# Patient Record
Sex: Female | Born: 1970 | Race: Black or African American | Hispanic: No | Smoking: Never smoker
Health system: Southern US, Community
[De-identification: ages and names within clinical notes are randomized; demographics above are authoritative.]

## PROBLEM LIST (undated history)

## (undated) DIAGNOSIS — I4891 Unspecified atrial fibrillation: Secondary | ICD-10-CM

## (undated) DIAGNOSIS — A419 Sepsis, unspecified organism: Secondary | ICD-10-CM

## (undated) DIAGNOSIS — E559 Vitamin D deficiency, unspecified: Secondary | ICD-10-CM

## (undated) DIAGNOSIS — I890 Lymphedema, not elsewhere classified: Secondary | ICD-10-CM

## (undated) DIAGNOSIS — L0291 Cutaneous abscess, unspecified: Secondary | ICD-10-CM

## (undated) DIAGNOSIS — D649 Anemia, unspecified: Secondary | ICD-10-CM

## (undated) DIAGNOSIS — I5030 Unspecified diastolic (congestive) heart failure: Secondary | ICD-10-CM

## (undated) DIAGNOSIS — L0390 Cellulitis, unspecified: Secondary | ICD-10-CM

## (undated) HISTORY — DX: Lymphedema, not elsewhere classified: I89.0

## (undated) HISTORY — DX: Morbid (severe) obesity due to excess calories: E66.01

## (undated) HISTORY — DX: Unspecified diastolic (congestive) heart failure: I50.30

## (undated) HISTORY — PX: INCISION AND DRAINAGE ABSCESS: SHX5864

## (undated) HISTORY — DX: Vitamin D deficiency, unspecified: E55.9

---

## 2013-05-12 ENCOUNTER — Encounter: Payer: Self-pay | Admitting: Internal Medicine

## 2013-05-12 ENCOUNTER — Non-Acute Institutional Stay (SKILLED_NURSING_FACILITY): Payer: Medicare Other | Admitting: Internal Medicine

## 2013-05-12 DIAGNOSIS — E46 Unspecified protein-calorie malnutrition: Secondary | ICD-10-CM

## 2013-05-12 DIAGNOSIS — L03116 Cellulitis of left lower limb: Secondary | ICD-10-CM | POA: Insufficient documentation

## 2013-05-12 DIAGNOSIS — I5030 Unspecified diastolic (congestive) heart failure: Secondary | ICD-10-CM | POA: Insufficient documentation

## 2013-05-12 DIAGNOSIS — I890 Lymphedema, not elsewhere classified: Secondary | ICD-10-CM | POA: Insufficient documentation

## 2013-05-12 DIAGNOSIS — E876 Hypokalemia: Secondary | ICD-10-CM | POA: Insufficient documentation

## 2013-05-12 DIAGNOSIS — R5381 Other malaise: Secondary | ICD-10-CM

## 2013-05-12 DIAGNOSIS — L0390 Cellulitis, unspecified: Secondary | ICD-10-CM

## 2013-05-12 DIAGNOSIS — L0291 Cutaneous abscess, unspecified: Secondary | ICD-10-CM

## 2013-05-12 DIAGNOSIS — E559 Vitamin D deficiency, unspecified: Secondary | ICD-10-CM | POA: Insufficient documentation

## 2013-05-12 NOTE — Progress Notes (Signed)
Patient ID: Michele Weber, female   DOB: [birth year], 43 y.o.   MRN: [id number]     [facility]  PCP: No primary provider on file.  No Known Allergies  Chief Complaint: new admission  HPI:  43 y/o female patient is here for STR after hospital and [facility] admission with cellulitis. She has morbid obesity and chronic lymphedema. She was on iv antibiotics and then switched to oral antibiotics, has now completed antibiotic course and is here for rehabilitation. She has been evaluated for rehabilitation. She was on appetite suppresant in [facility] which was discontinued on admission. Pt enquires about this. She complaints of pain with warmth on her left thigh today. No other complaints  Review of Systems:  Constitutional: Negative for fever, chills,diaphoresis.  HENT: Negative for congestion, hearing loss and sore throat.   Eyes: Negative for eye pain, blurred vision, double vision and discharge.  Respiratory: Negative for cough, sputum production, shortness of breath and wheezing.   Cardiovascular: Negative for chest pain, palpitations, orthopnea  Gastrointestinal: Negative for heartburn, nausea, vomiting, abdominal pain, diarrhea and constipation.  Genitourinary: has foley in place Musculoskeletal: Negative for back pain, falls, joint pain and myalgias.  Skin: Negative for itching and rash.  Neurological: Negative for dizziness, tingling, focal weakness and headaches. has generalized weakness Psychiatric/Behavioral: Negative for depression and memory loss. The patient is not nervous/anxious.     Past Medical History  Diagnosis Date  . Morbid obesity   . Lymphedema of both lower extremities   . Diastolic CHF   . Vitamin D deficiency    No past surgical history on file. Social History:   reports that she has never smoked. She does not have any smokeless tobacco history on file. She reports that she does not drink alcohol or use illicit drugs.  Family History  Problem  Relation Age of Onset  . Diabetes Father     Medications: Patient's Medications  New Prescriptions   No medications on file  Previous Medications   ACETAMINOPHEN (TYLENOL 8 HOUR) 650 MG CR TABLET    Take 650 mg by mouth every 8 (eight) hours as needed for pain.   FUROSEMIDE (LASIX) 40 MG TABLET    Take 40 mg by mouth 2 (two) times daily.   MICONAZOLE (MICOTIN) 2 % POWDER    Apply 1 application topically as needed for itching.   POTASSIUM CHLORIDE SA (K-DUR,KLOR-CON) 20 MEQ TABLET    Take 40 mEq by mouth 2 (two) times daily.  Modified Medications   No medications on file  Discontinued Medications   No medications on file     Physical Exam:  Filed Vitals:   05/12/13 1511  BP: 140/78  Pulse: 90  Temp: 97.5 F (36.4 C)  Resp: 18  Weight: 550 lb (249.478 kg)  SpO2: 95%    General- adult female in no acute distress, morbidly obese Head- atraumatic, normocephalic Eyes- PERRLA, EOMI, no pallor, no icterus, no discharge Neck- no lymphadenopathy, no thyromegaly Cardiovascular- normal s1,s2, no murmurs/ rubs/ gallops Respiratory- bilateral clear to auscultation, no wheeze, no rhonchi, no crackles, no use of accessory muscles Abdomen- bowel sounds present, soft, non tender Musculoskeletal- limited ROM given body habitus and edema Neurological- no focal deficit Skin- warm and dry, has redness with warmth and slight tenderness in inner mid thigh area on left leg. Has dressing in place, small amount of serous drainage present, dry skin Psychiatry- alert and oriented to person, place and time, normal mood and affect   Labs  reviewed: 05/08/13 na 138, k 3.9, co2 35, bun 11, cr 0.8  Assessment/Plan  Cellulitis- concern for new cellulitis on left thigh. Will start her prophylactically on doxycycline 100 mg bid for now for 10 days and reassess. Continue wound care and foley care.   Lymphedema- persists. Will have her work with therapy team for strengthening exercise, fall precautions.  Continue lasix 40 mg bid to help with the swelling  Hypokalemia- continue kcl supplement, monitor bmp  Deconditioning- encourage out of bed, fall precautions, to work with PT and OT for strengthening exercise  Protein calorie malnutrition- protein supplement to be continued. Will add vitamin c and zinc supplement to help with wound healing.   Morbid obesity- will stop appetite supressant given her hx of cardiac disease and concern for side effects/ contraindication with heart disease   Family/ staff Communication: reviewed care plan with patient and nursing supervisor   Goals of care: STR   Labs/tests ordered: cbc with diff, bmp    Oneal GroutMAHIMA Deisha Stull, MD  [facility] [phone] (Monday-Friday 8 am - 5 pm) [phone] (afterhours)

## 2013-05-13 LAB — HEPATIC FUNCTION PANEL
ALT: 16 U/L (ref 7–35)
AST: 17 U/L (ref 13–35)
Alkaline Phosphatase: 47 U/L (ref 25–125)
Bilirubin, Total: 0.4 mg/dL

## 2013-05-13 LAB — CBC AND DIFFERENTIAL
HCT: 34 % — AB (ref 36–46)
Hemoglobin: 11.1 g/dL — AB (ref 12.0–16.0)
Platelets: 297 10*3/uL (ref 150–399)
WBC: 7.2 10*3/mL

## 2013-05-13 LAB — BASIC METABOLIC PANEL
BUN: 11 mg/dL (ref 4–21)
CREATININE: 1 mg/dL (ref 0.5–1.1)
Glucose: 85 mg/dL
POTASSIUM: 4.1 mmol/L (ref 3.4–5.3)
Sodium: 136 mmol/L — AB (ref 137–147)

## 2013-06-03 ENCOUNTER — Non-Acute Institutional Stay (SKILLED_NURSING_FACILITY): Payer: Medicare Other | Admitting: Internal Medicine

## 2013-06-03 DIAGNOSIS — S81009A Unspecified open wound, unspecified knee, initial encounter: Secondary | ICD-10-CM

## 2013-06-03 DIAGNOSIS — S91009A Unspecified open wound, unspecified ankle, initial encounter: Principal | ICD-10-CM

## 2013-06-03 DIAGNOSIS — S81809A Unspecified open wound, unspecified lower leg, initial encounter: Principal | ICD-10-CM

## 2013-06-03 DIAGNOSIS — I890 Lymphedema, not elsewhere classified: Secondary | ICD-10-CM

## 2013-06-03 NOTE — Progress Notes (Signed)
Patient ID: Michele MowersSharon Dauphinais, female   DOB: 05/03/1970, 43 y.o.   MRN: [id number] [facility] Chief complaint followup for left leg edema History; this is a 43 year old woman who came to us from [facility]. She had been admitted with cellulitis. The situation is complicated by a lymphedema. She tells me that the swelling in her leg waxes and wanes and asked me to see her because of this. She also has morbid obesity and history of diastolic heart failure, and obstructive sleep apnea.. She currently is on Lasix 40 mg. She was put on doxycycline on admission for possible cellulitis.  Physical examination Gen. morbidly obese patient in no distress Respiratory surprisingly clear entry bilaterally Cardiac heart sounds are normal no overt signs of CHF Extremities she has bilateral lymphedema however this is much worse on the left. Medially there is a tremendous pannus in the upper thigh and also more distally. There is peau d'orange in both of these areas. In the superior area there is actually a superficial wound.  Impression/plan #1 secondary lymphedema; the patient tells me that this started in her 20's her right leg is not nearly as bad as the left. I think this patient is going to have recurrent problems with skin breakdown, coexistent infection unless we can find some way to control the degree of swelling in this left leg. She would not be a candidate for any form of wrap, the leg is just simply too large and the peau d'orange areas are simply too protuberant. The only thing that I could think of to try would be to see if she might be a candidate for external compression pumps. There would likely be insurance issues here as well as I think simply mechanical issues of whether this leg can't even be fitted. It is simply that large.

## 2013-06-07 ENCOUNTER — Encounter: Payer: Self-pay | Admitting: Adult Health

## 2013-06-07 ENCOUNTER — Non-Acute Institutional Stay (SKILLED_NURSING_FACILITY): Payer: Medicare Other | Admitting: Adult Health

## 2013-06-07 DIAGNOSIS — I890 Lymphedema, not elsewhere classified: Secondary | ICD-10-CM

## 2013-06-07 DIAGNOSIS — I509 Heart failure, unspecified: Secondary | ICD-10-CM

## 2013-06-07 DIAGNOSIS — I5030 Unspecified diastolic (congestive) heart failure: Secondary | ICD-10-CM

## 2013-06-07 DIAGNOSIS — L0390 Cellulitis, unspecified: Secondary | ICD-10-CM

## 2013-06-07 DIAGNOSIS — N912 Amenorrhea, unspecified: Secondary | ICD-10-CM

## 2013-06-07 DIAGNOSIS — R5381 Other malaise: Secondary | ICD-10-CM

## 2013-06-07 DIAGNOSIS — E876 Hypokalemia: Secondary | ICD-10-CM

## 2013-06-07 DIAGNOSIS — L0291 Cutaneous abscess, unspecified: Secondary | ICD-10-CM

## 2013-06-07 NOTE — Progress Notes (Signed)
Patient ID: Michele MowersSharon Mclean, female   DOB: November 17, 1970, 43 y.o.   MRN: [id number]     [facility]  No Known Allergies   Chief Complaint  Patient presents with  . Medical Management of Chronic Issues    HPI:  She is being seen for the management of her chronic illnesses. She is developing a cellulitis on her left inner thigh. The area is red warm and inflamed. There is drainage present. She was hospitalized due to her cellulitis recently and was placed on doxycycline at that time. She has severe lymphedema to both lower extremities with the left far worse than the right.   She states that she has not had a menstrual cycle in over 2 months. She states normally her cycle is regular and has not missed a cycle. This is unusual for her.   Past Medical History  Diagnosis Date  . Morbid obesity   . Lymphedema of both lower extremities   . Diastolic CHF   . Vitamin D deficiency     No past surgical history on file.  VITAL SIGNS BP 126/60  Pulse 80  Ht 5' (1.524 m)  Wt 564 lb (255.829 kg)  BMI 110.15 kg/m2   Patient's Medications  New Prescriptions   No medications on file  Previous Medications   ACETAMINOPHEN (TYLENOL 8 HOUR) 650 MG CR TABLET    Take 650 mg by mouth every 8 (eight) hours as needed for pain.   FUROSEMIDE (LASIX) 40 MG TABLET    Take 40 mg by mouth 2 (two) times daily.   METHOCARBAMOL (ROBAXIN) 500 MG TABLET    Take 500 mg by mouth every 8 (eight) hours as needed for muscle spasms.   MICONAZOLE (MICOTIN) 2 % POWDER    Apply 1 application topically as needed for itching.   POTASSIUM CHLORIDE SA (K-DUR,KLOR-CON) 20 MEQ TABLET    Take 40 mEq by mouth 2 (two) times daily.   TRAMADOL (ULTRAM) 50 MG TABLET    Take by mouth every 4 (four) hours as needed.  Modified Medications   No medications on file  Discontinued Medications   No medications on file    SIGNIFICANT DIAGNOSTIC EXAMS   LABS REVIEWED:   05-13-13: wbc 7.2; hgb 11.1; hct 34.4; mcv 91 plt 297; glucose  85; bun 11; creat 0.99; k+4.1; na++ 136 ;liver normal albumin 3.0     Review of Systems  Constitutional: Negative for malaise/fatigue.  Respiratory: Negative for cough and shortness of breath.   Cardiovascular: Positive for leg swelling. Negative for chest pain and palpitations.       Bilateral lower extremity lymphedema is severe   Musculoskeletal: Negative for joint pain and myalgias.  Skin:       Has sore on left inner thigh  Psychiatric/Behavioral: Negative for depression. The patient is not nervous/anxious.    Physical Exam  Constitutional: She is oriented to person, place, and time. No distress.  Morbid obesity   Neck: Neck supple. No JVD present.  Cardiovascular: Normal rate, regular rhythm and intact distal pulses.   GI: Soft. Bowel sounds are normal.  Genitourinary:  Has foley  Musculoskeletal: She exhibits edema.  Is unable to move lower extremities due to lymphedema and obesity is able to move upper extremities   Neurological: She is alert and oriented to person, place, and time.  Skin: Skin is warm and dry. She is not diaphoretic.  Left inner thigh wound with pink wound bed with slough present; the surrounding tissue is erythremic the  skin is hard to touch and warm to touch. There is severe lymphedema present.   Psychiatric: She has a normal mood and affect.     ASSESSMENT/ PLAN:  1. Morbid obesity: she is without change in her status; her current weight is 564 pounds; will continue to monitor her status   2. Lymphedema left lower extremity: is without change; she is being fitted this week for external compression hose for treatment of her lymphedema; will continue robaxin 500 mg every 8 hours for spasms and wil continue her ultram 50 mg every 4 hours as needed for pain;  will monitor  3. Diastolic chf: she is present stable will continue lasix 40 mg twice daily with k+ 40 meq twice daily    4. Cellulitis left thigh: will begin her on keflex 500 mg four times daily  for 3 weeks with florastor twice daily for 3 weeks as probiotic will monitor;   5. Left thigh wound: will continue current treatment and will continue to monitor her status.   6. Physical deconditioning: will continue therapy as directed   7. Amenorrhea: will check an estrogen fh and fsh; will need to send her to gyn for further workup.      Synthia Innocenteborah Khylee Algeo NP [facility]  Contact [phone] Monday through Friday 8am- 5pm  After hours call [phone]

## 2013-07-04 ENCOUNTER — Non-Acute Institutional Stay (SKILLED_NURSING_FACILITY): Payer: Medicare Other | Admitting: Internal Medicine

## 2013-07-04 DIAGNOSIS — I509 Heart failure, unspecified: Secondary | ICD-10-CM

## 2013-07-04 DIAGNOSIS — Z79899 Other long term (current) drug therapy: Secondary | ICD-10-CM

## 2013-07-04 DIAGNOSIS — E876 Hypokalemia: Secondary | ICD-10-CM

## 2013-07-04 DIAGNOSIS — I890 Lymphedema, not elsewhere classified: Secondary | ICD-10-CM

## 2013-07-04 DIAGNOSIS — I5030 Unspecified diastolic (congestive) heart failure: Secondary | ICD-10-CM

## 2013-07-04 NOTE — Progress Notes (Signed)
         PROGRESS NOTE  DATE: 07/04/2013  FACILITY: Nursing Home Location: [facility]  LEVEL OF CARE: SNF (31)  Routine Visit  CHIEF COMPLAINT:  Manage lymphedema, CHF and hypokalemia  HISTORY OF PRESENT ILLNESS:  REASSESSMENT OF ONGOING PROBLEM(S):  LYMPHEDEMA: The treatment nurse reports that patient's left inner thigh has increased edema and warmth. Patient has massive lymphedema. Patient denies tenderness or redness in the left eye. Symptoms were noted this morning. She has Completed antibiotics for cellulitis last week. Patient denies fever chills or night sweats.  CHF:The patient does not relate significant weight changes, denies sob, DOE, orthopnea, PNDs, palpitations or chest pain.  CHF remains stable.  No complications form the medications being used. Patient has chronic lower extremity swelling.  HYPOKALEMIA: The patient's hypokalemia remains stable. Patient denies muscle cramping or palpitations. No complications reported from current potassium supplementation.  PAST MEDICAL HISTORY : Reviewed.  No changes/see problem list  CURRENT MEDICATIONS: Reviewed per MAR/see medication list  REVIEW OF SYSTEMS:  GENERAL: no change in appetite, no fatigue, no weight changes, no fever, chills or weakness RESPIRATORY: no cough, SOB, DOE, wheezing, hemoptysis CARDIAC: no chest pain, or palpitations, complains of chronic LE swelling GI: no abdominal pain, diarrhea, constipation, heart burn, nausea or vomiting  PHYSICAL EXAMINATION  VS:  See VS section  GENERAL: no acute distress, morbidly obese body habitus SKIN: Left thigh is warm, edematous, nontender, no erythema EYES: conjunctivae normal, sclerae normal, normal eye lids NECK: supple, trachea midline, no neck masses, no thyroid tenderness, no thyromegaly LYMPHATICS: no LAN in the neck, no supraclavicular LAN RESPIRATORY: breathing is even & unlabored, BS CTAB CARDIAC: RRR, no murmur,no extra heart  sounds, 6 bilateral lower extremity edema GI: abdomen soft, normal BS, no masses, no tenderness, no hepatomegaly, no splenomegaly PSYCHIATRIC: the patient is alert & oriented to person, affect & behavior appropriate  LABS/RADIOLOGY: 4-15 albumin 3.3 otherwise CMP normal  ASSESSMENT/PLAN:  CHF-compensated Lymphedema-no sign of cellulitis in the left thigh today. Discussed with treatment nurse to monitor daily for development of cellulitis changes. Hypokalemia-well repleted Morbid obesity-continue supportive care Check CBC  CPT CODE: 1610999309  Michele CoxGayani Y Byron Tipping, MD [facility] [phone]

## 2013-07-06 ENCOUNTER — Non-Acute Institutional Stay (SKILLED_NURSING_FACILITY): Payer: Medicare Other | Admitting: Internal Medicine

## 2013-07-06 DIAGNOSIS — R635 Abnormal weight gain: Secondary | ICD-10-CM

## 2013-07-06 DIAGNOSIS — E876 Hypokalemia: Secondary | ICD-10-CM

## 2013-07-06 DIAGNOSIS — D649 Anemia, unspecified: Secondary | ICD-10-CM

## 2013-07-07 NOTE — Progress Notes (Signed)
Patient ID: Michele MowersSharon Weber, female   DOB: 02-24-1970, 43 Weber.o.   MRN: [id number]            PROGRESS NOTE  DATE: 07/06/2013  FACILITY:  [facility]  LEVEL OF CARE: SNF (31)  Acute Visit  CHIEF COMPLAINT:  Manage weight gain and anemia.    HISTORY OF PRESENT ILLNESS: I was requested by the staff to assess the patient regarding above problem(s):  WEIGHT GAIN:  Staff report that patient has been gaining weight.  Admission weight was 558 on 05/17/2013.  Yesterday, weight was 581.  She has massive lymphedema and is on Lasix.  On 07/01/2013, weight was 581 as well.  Patient denies shortness of breath, but she does admit to increased lower extremity swelling.    ANEMIA: The anemia has been stable. The patient denies fatigue, melena or hematochezia. No complications from the medications currently being used.  On 07/05/2013:  Hemoglobin 10.9, MCV 92.  PAST MEDICAL HISTORY : Reviewed.  No changes/see problem list  CURRENT MEDICATIONS: Reviewed per MAR/see medication list  REVIEW OF SYSTEMS:  GENERAL: no change in appetite, no fatigue, no weight changes, no fever, chills or weakness RESPIRATORY: no cough, SOB, DOE,, wheezing, hemoptysis CARDIAC: no chest pain or palpitations;  chronic lower extremity swelling     GI: no abdominal pain, diarrhea, constipation, heart burn, nausea or vomiting  PHYSICAL EXAMINATION  VS:  T 97.7       P 86      RR 20      BP 120/60             GENERAL: no acute distress, morbidly obese body habitus EYES: conjunctivae normal, sclerae normal, normal eye lids NECK: supple, trachea midline, no neck masses, no thyroid tenderness, no thyromegaly LYMPHATICS: no LAN in the neck, no supraclavicular LAN RESPIRATORY: breathing is even & unlabored, BS CTAB CARDIAC: RRR, no murmur,no extra heart sounds, +2 bilateral lower extremity edema    GI: abdomen soft, normal BS, no masses, no tenderness, no hepatomegaly, no splenomegaly PSYCHIATRIC: the patient  is alert & oriented to person, affect & behavior appropriate  LABS/RADIOLOGY:   In 05/2013:  Albumin 3.3, AST 7, ALT 7.     ASSESSMENT/PLAN:  Weight gain.   New problem.  Increase Lasix to 60 mg b.i.d. for four days and then decrease back to 40 mg b.i.d.  Check TSH.    Anemia.  Stable.   Hypokalemia.  Increase K-dur to 40 mEq t.i.d. for four days, then decrease back to 40 mEq b.i.d.    CPT CODE: 8295699309       Michele CoxGayani Weber Michele Cazarez, MD [facility] [phone]

## 2013-07-08 DIAGNOSIS — D649 Anemia, unspecified: Secondary | ICD-10-CM | POA: Insufficient documentation

## 2013-07-08 DIAGNOSIS — R635 Abnormal weight gain: Secondary | ICD-10-CM | POA: Insufficient documentation

## 2013-07-18 ENCOUNTER — Non-Acute Institutional Stay (SKILLED_NURSING_FACILITY): Payer: Medicare Other | Admitting: Internal Medicine

## 2013-07-18 DIAGNOSIS — L02219 Cutaneous abscess of trunk, unspecified: Secondary | ICD-10-CM

## 2013-07-18 DIAGNOSIS — L03319 Cellulitis of trunk, unspecified: Principal | ICD-10-CM

## 2013-07-19 DIAGNOSIS — L0390 Cellulitis, unspecified: Secondary | ICD-10-CM

## 2013-07-19 DIAGNOSIS — L0291 Cutaneous abscess, unspecified: Secondary | ICD-10-CM

## 2013-07-20 ENCOUNTER — Non-Acute Institutional Stay (SKILLED_NURSING_FACILITY): Payer: Medicare Other | Admitting: Internal Medicine

## 2013-07-20 DIAGNOSIS — L0390 Cellulitis, unspecified: Secondary | ICD-10-CM

## 2013-07-21 DIAGNOSIS — L03319 Cellulitis of trunk, unspecified: Principal | ICD-10-CM

## 2013-07-21 DIAGNOSIS — L02219 Cutaneous abscess of trunk, unspecified: Secondary | ICD-10-CM | POA: Insufficient documentation

## 2013-07-21 NOTE — Progress Notes (Signed)
Patient ID: Michele Weber, female   DOB: September 30, 1970, 43 y.o.   MRN: [id number]            PROGRESS NOTE  DATE: 07/18/2013       FACILITY:  [facility]  LEVEL OF CARE: SNF (31)  Acute Visit  CHIEF COMPLAINT:  Manage right buttock abscess.    HISTORY OF PRESENT ILLNESS: I was requested by the staff to assess the patient regarding above problem(s):  Treatment nurse reports that patient has a boil on her right buttock from which there is a discharge, which was cultured.  The discharge grows three organisms:  E.coli, Staphylococcus aureus, and Streptococcus group B significantly.  The patient does complain of some pain but no fever, chills or night sweats.    PAST MEDICAL HISTORY : Reviewed.  No changes/see problem list  CURRENT MEDICATIONS: Reviewed per MAR/see medication list  REVIEW OF SYSTEMS:  GENERAL: no change in appetite, no fatigue, no weight changes, no fever, chills or weakness RESPIRATORY: no cough, SOB, DOE,, wheezing, hemoptysis CARDIAC: no chest pain or palpitations; complains of  chronic lower extremity swelling    GI: no abdominal pain, diarrhea, constipation, heart burn, nausea or vomiting  PHYSICAL EXAMINATION  VS:  T 97.6       P 80      RR 18      BP 138/76     POX 97%       WT (Lb) 573       GENERAL: no acute distress, morbidly obese body habitus NECK: supple, trachea midline, no neck masses, no thyroid tenderness, no thyromegaly RESPIRATORY: breathing is even & unlabored, BS CTAB CARDIAC: RRR, no murmur,no extra heart sounds, +6 bilateral lower extremity edema    GI: abdomen soft, normal BS, no masses, no tenderness, no hepatomegaly, no splenomegaly PSYCHIATRIC: the patient is alert & oriented to person, affect & behavior appropriate SKIN:  INSPECTION: right upper thigh has about a 2 cm abscess which is tender to palpation    ASSESSMENT/PLAN:  Right thigh abscess.  New problem.  All organisms are only sensitive to gentamicin.   Therefore, start gentamicin per pharmacy protocol for 10 days and probiotics b.i.d. for 14 days.    CPT CODE: 62952       Angela Cox, MD [facility] [phone]

## 2013-07-24 NOTE — Progress Notes (Addendum)
Patient ID: Michele Weber, female   DOB: [birth year], 43 y.o.   MRN: [id number]                  PROGRESS NOTE  DATE:  07/19/2013    [facility]    LEVEL OF CARE:   SNF   Acute Visit   CHIEF COMPLAINT:  Review of abscess on her posterior right thigh.    HISTORY OF PRESENT ILLNESS:  Michele Weber is a patient who has severe secondary lymphedema of both extremities.  She also tells me that she has had a recurrent abscess in the back of her right thigh just distal to her gluteal fold.  This last cleared up about 5-6 months ago.  She has one currently and a culture was done of this area that showed a multi-drug resistant E.coli, MRSA, as well as group B strep.  The area is actually open, drained, and appears to be closing.   She was put on gentamicin for seven days.   However, with her size, the gentamicin dose is being given at 450 mg IM, boiling down to four injections every eight hours.  I think this is superfluous as the area actually appears to be improving.    PHYSICAL EXAMINATION:   SKIN:  INSPECTION:  Right leg:  The area is still open.  It protrudes and is small, but still has a slight amount of drainage.  There does not appear to be underlying palpable abscess.  She does have other marks here which look like she has had healed areas, left greater than right actually.    ASSESSMENT/PLAN:  Skin abscess.  This had very difficult organisms in a difficult place place.  She is receiving four injections every 8 hours, which amounts to 12 injections a day.  I think this is unreasonable.  I have changed her to Augmentin and Zyvox.  Both organisms, the E.coli and the MRSA, are multi-drug resistant organisms.  Zyvox should cover the MRSA, and I have chosen to use Augmentin even though the sensitivity was only intermediate in terms of the E.coli.

## 2013-07-25 ENCOUNTER — Non-Acute Institutional Stay (SKILLED_NURSING_FACILITY): Payer: Medicare Other | Admitting: Internal Medicine

## 2013-07-25 DIAGNOSIS — I5030 Unspecified diastolic (congestive) heart failure: Secondary | ICD-10-CM

## 2013-07-25 DIAGNOSIS — E876 Hypokalemia: Secondary | ICD-10-CM

## 2013-07-25 DIAGNOSIS — I890 Lymphedema, not elsewhere classified: Secondary | ICD-10-CM

## 2013-07-25 DIAGNOSIS — I509 Heart failure, unspecified: Secondary | ICD-10-CM

## 2013-07-25 NOTE — Progress Notes (Signed)
                PROGRESS NOTE  DATE: 07-25-13  FACILITY: Nursing Home Location: [facility]  LEVEL OF CARE: SNF (31)  Routine Visit  CHIEF COMPLAINT:  Manage lymphedema, CHF and hypokalemia  HISTORY OF PRESENT ILLNESS:  REASSESSMENT OF ONGOING PROBLEM(S):  LYMPHEDEMA: The treatment nurse reports that patient's left inner thigh has increased edema and warmth. Patient has massive lymphedema. Patient denies tenderness or redness in the left eye. Symptoms were noted this morning. She has Completed antibiotics for cellulitis last week. Patient denies fever chills or night sweats.  CHF:The patient does not relate significant weight changes, denies sob, DOE, orthopnea, PNDs, palpitations or chest pain.  CHF remains stable.  No complications form the medications being used. Patient has chronic lower extremity swelling.  HYPOKALEMIA: The patient's hypokalemia remains stable. Patient denies muscle cramping or palpitations. No complications reported from current potassium supplementation.  PAST MEDICAL HISTORY : Reviewed.  No changes/see problem list  CURRENT MEDICATIONS: Reviewed per MAR/see medication list  REVIEW OF SYSTEMS:  GENERAL: no change in appetite, no fatigue, no weight changes, no fever, chills or weakness RESPIRATORY: no cough, SOB, DOE, wheezing, hemoptysis CARDIAC: no chest pain, or palpitations, complains of chronic LE swelling GI: no abdominal pain, diarrhea, constipation, heart burn, nausea or vomiting  PHYSICAL EXAMINATION  VS:  See VS section  GENERAL: no acute distress, morbidly obese body habitus NECK: supple, trachea midline, no neck masses, no thyroid tenderness, no thyromegaly RESPIRATORY: breathing is even & unlabored, BS CTAB CARDIAC: RRR, no murmur,no extra heart sounds, 6 bilateral lower extremity edema GI: abdomen soft, normal BS, no masses, no tenderness, no hepatomegaly, no splenomegaly PSYCHIATRIC: the patient is alert & oriented to  person, affect & behavior appropriate  LABS/RADIOLOGY:  5-15 TSH 4.35, hemoglobin 10.9, MCV 92 otherwise CBC normal, BMP normal 4-15 albumin 3.3 otherwise CMP normal  ASSESSMENT/PLAN:  CHF-compensated Lymphedema-Massive. Continue supportive care. Hypokalemia-well repleted Morbid obesity-continue supportive care Anemia-stable Right buttock abscess-on Zyvox and augmentin.  CPT CODE: 19166  Angela Cox, MD [facility] [phone]

## 2013-08-11 ENCOUNTER — Inpatient Hospital Stay (HOSPITAL_COMMUNITY): Payer: Medicare Other

## 2013-08-11 ENCOUNTER — Inpatient Hospital Stay (HOSPITAL_COMMUNITY)
Admission: EM | Admit: 2013-08-11 | Discharge: 2013-08-15 | DRG: 309 | Disposition: A | Discharge status: Skilled Nursing Facility | Payer: Medicare Other | Attending: Internal Medicine | Admitting: Internal Medicine

## 2013-08-11 ENCOUNTER — Encounter (HOSPITAL_COMMUNITY): Payer: Self-pay | Admitting: Emergency Medicine

## 2013-08-11 DIAGNOSIS — L02219 Cutaneous abscess of trunk, unspecified: Secondary | ICD-10-CM

## 2013-08-11 DIAGNOSIS — E876 Hypokalemia: Secondary | ICD-10-CM

## 2013-08-11 DIAGNOSIS — S81009A Unspecified open wound, unspecified knee, initial encounter: Secondary | ICD-10-CM

## 2013-08-11 DIAGNOSIS — Z6841 Body Mass Index (BMI) 40.0 and over, adult: Secondary | ICD-10-CM

## 2013-08-11 DIAGNOSIS — L03319 Cellulitis of trunk, unspecified: Secondary | ICD-10-CM

## 2013-08-11 DIAGNOSIS — E559 Vitamin D deficiency, unspecified: Secondary | ICD-10-CM

## 2013-08-11 DIAGNOSIS — S81809A Unspecified open wound, unspecified lower leg, initial encounter: Secondary | ICD-10-CM

## 2013-08-11 DIAGNOSIS — I5032 Chronic diastolic (congestive) heart failure: Secondary | ICD-10-CM

## 2013-08-11 DIAGNOSIS — E46 Unspecified protein-calorie malnutrition: Secondary | ICD-10-CM

## 2013-08-11 DIAGNOSIS — L97109 Non-pressure chronic ulcer of unspecified thigh with unspecified severity: Secondary | ICD-10-CM | POA: Diagnosis present

## 2013-08-11 DIAGNOSIS — I517 Cardiomegaly: Secondary | ICD-10-CM

## 2013-08-11 DIAGNOSIS — I4891 Unspecified atrial fibrillation: Principal | ICD-10-CM

## 2013-08-11 DIAGNOSIS — R5381 Other malaise: Secondary | ICD-10-CM

## 2013-08-11 DIAGNOSIS — I5030 Unspecified diastolic (congestive) heart failure: Secondary | ICD-10-CM | POA: Diagnosis present

## 2013-08-11 DIAGNOSIS — Z833 Family history of diabetes mellitus: Secondary | ICD-10-CM

## 2013-08-11 DIAGNOSIS — S91009A Unspecified open wound, unspecified ankle, initial encounter: Secondary | ICD-10-CM

## 2013-08-11 DIAGNOSIS — R635 Abnormal weight gain: Secondary | ICD-10-CM

## 2013-08-11 DIAGNOSIS — Z938 Other artificial opening status: Secondary | ICD-10-CM

## 2013-08-11 DIAGNOSIS — N912 Amenorrhea, unspecified: Secondary | ICD-10-CM

## 2013-08-11 DIAGNOSIS — I5042 Chronic combined systolic (congestive) and diastolic (congestive) heart failure: Secondary | ICD-10-CM | POA: Diagnosis present

## 2013-08-11 DIAGNOSIS — D649 Anemia, unspecified: Secondary | ICD-10-CM

## 2013-08-11 DIAGNOSIS — Z79899 Other long term (current) drug therapy: Secondary | ICD-10-CM

## 2013-08-11 DIAGNOSIS — I509 Heart failure, unspecified: Secondary | ICD-10-CM

## 2013-08-11 DIAGNOSIS — D6489 Other specified anemias: Secondary | ICD-10-CM | POA: Diagnosis present

## 2013-08-11 DIAGNOSIS — G4733 Obstructive sleep apnea (adult) (pediatric): Secondary | ICD-10-CM | POA: Diagnosis present

## 2013-08-11 DIAGNOSIS — I890 Lymphedema, not elsewhere classified: Secondary | ICD-10-CM

## 2013-08-11 LAB — BASIC METABOLIC PANEL
BUN: 18 mg/dL (ref 6–23)
CALCIUM: 8.8 mg/dL (ref 8.4–10.5)
CO2: 27 mEq/L (ref 19–32)
Chloride: 98 mEq/L (ref 96–112)
Creatinine, Ser: 0.92 mg/dL (ref 0.50–1.10)
GFR calc Af Amer: 87 mL/min — ABNORMAL LOW (ref >90)
GFR, EST NON AFRICAN AMERICAN: 75 mL/min — AB (ref >90)
GLUCOSE: 110 mg/dL — AB (ref 70–99)
POTASSIUM: 4.1 meq/L (ref 3.7–5.3)
Sodium: 136 mEq/L — ABNORMAL LOW (ref 137–147)

## 2013-08-11 LAB — COMPREHENSIVE METABOLIC PANEL
ALBUMIN: 2.4 g/dL — AB (ref 3.5–5.2)
ALT: <5 U/L (ref 0–35)
AST: 8 U/L (ref 0–37)
Alkaline Phosphatase: 46 U/L (ref 39–117)
BUN: 17 mg/dL (ref 6–23)
CALCIUM: 8.6 mg/dL (ref 8.4–10.5)
CO2: 30 mEq/L (ref 19–32)
CREATININE: 0.89 mg/dL (ref 0.50–1.10)
Chloride: 101 mEq/L (ref 96–112)
GFR calc Af Amer: >90 mL/min (ref >90)
GFR, EST NON AFRICAN AMERICAN: 78 mL/min — AB (ref >90)
Glucose, Bld: 110 mg/dL — ABNORMAL HIGH (ref 70–99)
Potassium: 4.5 mEq/L (ref 3.7–5.3)
Sodium: 139 mEq/L (ref 137–147)
Total Bilirubin: 0.3 mg/dL (ref 0.3–1.2)
Total Protein: 7.7 g/dL (ref 6.0–8.3)

## 2013-08-11 LAB — CBC
HCT: 34.5 % — ABNORMAL LOW (ref 36.0–46.0)
HEMOGLOBIN: 10.8 g/dL — AB (ref 12.0–15.0)
MCH: 28.5 pg (ref 26.0–34.0)
MCHC: 31.3 g/dL (ref 30.0–36.0)
MCV: 91 fL (ref 78.0–100.0)
Platelets: 298 10*3/uL (ref 150–400)
RBC: 3.79 MIL/uL — AB (ref 3.87–5.11)
RDW: 16 % — ABNORMAL HIGH (ref 11.5–15.5)
WBC: 6.5 10*3/uL (ref 4.0–10.5)

## 2013-08-11 LAB — CBC WITH DIFFERENTIAL/PLATELET
Basophils Absolute: 0 10*3/uL (ref 0.0–0.1)
Basophils Relative: 0 % (ref 0–1)
EOS ABS: 0.6 10*3/uL (ref 0.0–0.7)
EOS PCT: 9 % — AB (ref 0–5)
HCT: 37.3 % (ref 36.0–46.0)
Hemoglobin: 11.8 g/dL — ABNORMAL LOW (ref 12.0–15.0)
LYMPHS PCT: 41 % (ref 12–46)
Lymphs Abs: 2.7 10*3/uL (ref 0.7–4.0)
MCH: 29.3 pg (ref 26.0–34.0)
MCHC: 31.6 g/dL (ref 30.0–36.0)
MCV: 92.6 fL (ref 78.0–100.0)
MONOS PCT: 10 % (ref 3–12)
Monocytes Absolute: 0.7 10*3/uL (ref 0.1–1.0)
NEUTROS ABS: 2.7 10*3/uL (ref 1.7–7.7)
NEUTROS PCT: 40 % — AB (ref 43–77)
PLATELETS: 303 10*3/uL (ref 150–400)
RBC: 4.03 MIL/uL (ref 3.87–5.11)
RDW: 15.9 % — ABNORMAL HIGH (ref 11.5–15.5)
WBC: 6.7 10*3/uL (ref 4.0–10.5)

## 2013-08-11 LAB — TROPONIN I
Troponin I: <0.3 ng/mL (ref <0.30)
Troponin I: <0.3 ng/mL (ref <0.30)
Troponin I: <0.3 ng/mL (ref <0.30)

## 2013-08-11 LAB — PROTIME-INR
INR: 1.12 (ref 0.00–1.49)
PROTHROMBIN TIME: 14.4 s (ref 11.6–15.2)

## 2013-08-11 LAB — MRSA PCR SCREENING: MRSA by PCR: POSITIVE — AB

## 2013-08-11 LAB — MAGNESIUM: MAGNESIUM: 1.8 mg/dL (ref 1.5–2.5)

## 2013-08-11 LAB — TSH: TSH: 3.98 u[IU]/mL (ref 0.350–4.500)

## 2013-08-11 LAB — HEPARIN LEVEL (UNFRACTIONATED)

## 2013-08-11 MED ORDER — ONDANSETRON HCL 4 MG PO TABS: 4.0000 mg | ORAL_TABLET | Freq: Four times a day (QID) | ORAL | Status: DC | PRN

## 2013-08-11 MED ORDER — SODIUM CHLORIDE 0.9 % IJ SOLN
3.0000 mL | Freq: Two times a day (BID) | INTRAMUSCULAR | Status: DC
  Administered 2013-08-12 – 2013-08-14 (×4): 3 mL via INTRAVENOUS

## 2013-08-11 MED ORDER — ACETAMINOPHEN 325 MG PO TABS: 650.0000 mg | ORAL_TABLET | Freq: Four times a day (QID) | ORAL | Status: DC | PRN

## 2013-08-11 MED ORDER — ACETAMINOPHEN 650 MG RE SUPP: 650.0000 mg | Freq: Four times a day (QID) | RECTAL | Status: DC | PRN

## 2013-08-11 MED ORDER — HEPARIN BOLUS VIA INFUSION
5000.0000 [IU] | Freq: Once | INTRAVENOUS | Status: AC
  Administered 2013-08-11: 5000 [IU] via INTRAVENOUS
  Filled 2013-08-11: qty 5000

## 2013-08-11 MED ORDER — METOPROLOL TARTRATE 25 MG PO TABS
25.0000 mg | ORAL_TABLET | Freq: Three times a day (TID) | ORAL | Status: DC
  Filled 2013-08-11 (×4): qty 1

## 2013-08-11 MED ORDER — SODIUM CHLORIDE 0.9 % IV SOLN: INTRAVENOUS | Status: DC

## 2013-08-11 MED ORDER — ENOXAPARIN SODIUM 150 MG/ML ~~LOC~~ SOLN
130.0000 mg | SUBCUTANEOUS | Status: DC
  Filled 2013-08-11: qty 1

## 2013-08-11 MED ORDER — METOPROLOL TARTRATE 25 MG PO TABS
25.0000 mg | ORAL_TABLET | Freq: Three times a day (TID) | ORAL | Status: DC
  Administered 2013-08-11 – 2013-08-12 (×4): 25 mg via ORAL
  Filled 2013-08-11 (×8): qty 1

## 2013-08-11 MED ORDER — HEPARIN BOLUS VIA INFUSION
3000.0000 [IU] | Freq: Once | INTRAVENOUS | Status: AC
  Administered 2013-08-11: 3000 [IU] via INTRAVENOUS
  Filled 2013-08-11: qty 3000

## 2013-08-11 MED ORDER — HEPARIN (PORCINE) IN NACL 100-0.45 UNIT/ML-% IJ SOLN
2400.0000 [IU]/h | INTRAMUSCULAR | Status: DC
  Administered 2013-08-11: 1700 [IU]/h via INTRAVENOUS
  Administered 2013-08-11: 1400 [IU]/h via INTRAVENOUS
  Administered 2013-08-12: 2400 [IU]/h via INTRAVENOUS
  Filled 2013-08-11 (×5): qty 250

## 2013-08-11 MED ORDER — DILTIAZEM HCL 100 MG IV SOLR
5.0000 mg/h | INTRAVENOUS | Status: DC
  Administered 2013-08-11: 5 mg/h via INTRAVENOUS

## 2013-08-11 MED ORDER — DILTIAZEM LOAD VIA INFUSION
10.0000 mg | Freq: Once | INTRAVENOUS | Status: AC
  Administered 2013-08-11: 10 mg via INTRAVENOUS
  Filled 2013-08-11: qty 10

## 2013-08-11 MED ORDER — DILTIAZEM HCL 100 MG IV SOLR
5.0000 mg/h | INTRAVENOUS | Status: DC
  Administered 2013-08-11: 15 mg/h via INTRAVENOUS
  Administered 2013-08-11: 10 mg/h via INTRAVENOUS
  Administered 2013-08-11: 5 mg/h via INTRAVENOUS
  Filled 2013-08-11 (×3): qty 100

## 2013-08-11 MED ORDER — ONDANSETRON HCL 4 MG/2ML IJ SOLN: 4.0000 mg | Freq: Four times a day (QID) | INTRAMUSCULAR | Status: DC | PRN

## 2013-08-11 MED ORDER — SODIUM CHLORIDE 0.9 % IV BOLUS (SEPSIS)
1000.0000 mL | Freq: Once | INTRAVENOUS | Status: AC
  Administered 2013-08-11: 1000 mL via INTRAVENOUS

## 2013-08-11 NOTE — Progress Notes (Signed)
Pt HR fluctuating between 110's-150's, pt already on a Cardizem drip going at 15. RN paged MD. Orders given for 25 mg of Lopressor three times daily. Pt asymptomatic and is currently resting in bed with call bell within reach. Will continue to monitor.   Chesley Miresavis, Dreama R

## 2013-08-11 NOTE — ED Notes (Signed)
Per EMS[facility].  Tachycardia first noticed two days ago.  Per EMS EKG showing atrial fibrillation.  Pt is asymptomatic.  No hx of a fib nor tachycardia.   HR 80-160's.

## 2013-08-11 NOTE — ED Provider Notes (Signed)
CSN: [id number]     Arrival date & time 08/11/13  0001 History   First MD Initiated Contact with Patient 08/11/13 0012     Chief Complaint  Patient presents with  . Atrial Fibrillation     (Consider location/radiation/quality/duration/timing/severity/associated sxs/prior Treatment) HPI 43 year old female presents to emergency department from her nursing facility with complaint of tachycardia.  Patient reports that she has had a fast heart rate since Monday.  She denies any shortness of breath, no discomfort with palpitations, no prior history of same.  EKG done tonight showed A. fib.  Patient has history of morbid obesity, lymphedema of lower extremities.  She was admitted in March to [facility] after cellulitis.  She is currently now in a nursing home.  Patient currently not on anticoagulation. Past Medical History  Diagnosis Date  . Morbid obesity   . Lymphedema of both lower extremities   . Diastolic CHF   . Vitamin D deficiency    History reviewed. No pertinent past surgical history. Family History  Problem Relation Age of Onset  . Diabetes Father    History  Substance Use Topics  . Smoking status: Never Smoker   . Smokeless tobacco: Not on file  . Alcohol Use: No   OB History   Grav Para Term Preterm Abortions TAB SAB Ect Mult Living                 Review of Systems  See History of Present Illness; otherwise all other systems are reviewed and negative   Allergies  Review of patient's allergies indicates no known allergies.  Home Medications   Prior to Admission medications   Medication Sig Start Date End Date Taking? Authorizing Provider  acetaminophen (TYLENOL 8 HOUR) 650 MG CR tablet Take 650 mg by mouth every 8 (eight) hours as needed for pain.    Historical Provider, MD  furosemide (LASIX) 40 MG tablet Take 40 mg by mouth 2 (two) times daily.    Historical Provider, MD  methocarbamol (ROBAXIN) 500 MG tablet Take 500 mg by mouth every 8 (eight) hours as needed  for muscle spasms.    Historical Provider, MD  miconazole (MICOTIN) 2 % powder Apply 1 application topically as needed for itching.    Historical Provider, MD  potassium chloride SA (K-DUR,KLOR-CON) 20 MEQ tablet Take 40 mEq by mouth 2 (two) times daily.    Historical Provider, MD  traMADol (ULTRAM) 50 MG tablet Take by mouth every 4 (four) hours as needed.    Historical Provider, MD   BP 105/68  Pulse 123  Temp(Src) 97.7 F (36.5 C) (Oral)  Resp 16  SpO2 99%  LMP 08/10/2013 Physical Exam  Nursing note and vitals reviewed. Constitutional: She is oriented to person, place, and time. She appears well-developed and well-nourished. No distress.  Patient morbidly obese  HENT:  Head: Normocephalic and atraumatic.  Right Ear: External ear normal.  Left Ear: External ear normal.  Nose: Nose normal.  Mouth/Throat: Oropharynx is clear and moist.  Eyes: Conjunctivae and EOM are normal. Pupils are equal, round, and reactive to light.  Neck: Normal range of motion. Neck supple. No JVD present. No tracheal deviation present. No thyromegaly present.  Cardiovascular: Normal heart sounds and intact distal pulses.  Exam reveals no gallop and no friction rub.   No murmur heard. Tachycardia with irregular rate and rhythm  Pulmonary/Chest: Effort normal and breath sounds normal. No stridor. No respiratory distress. She has no wheezes. She has no rales. She exhibits no tenderness.  Abdominal: Soft. Bowel sounds are normal. She exhibits no distension and no mass. There is no tenderness. There is no rebound and no guarding.  Musculoskeletal: Normal range of motion. She exhibits edema (lymphedema of bilateral lower legs). She exhibits no tenderness.  Lymphadenopathy:    She has no cervical adenopathy.  Neurological: She is alert and oriented to person, place, and time. She exhibits normal muscle tone. Coordination normal.  Skin: Skin is warm and dry. No rash noted. No erythema. No pallor.  Psychiatric: She  has a normal mood and affect. Her behavior is normal. Judgment and thought content normal.    ED Course  Procedures (including critical care time)  CRITICAL CARE Performed by: Olivia MackieTTER,Naraly Fritcher M Total critical care time: 60 min Critical care time was exclusive of separately billable procedures and treating other patients. Critical care was necessary to treat or prevent imminent or life-threatening deterioration. Critical care was time spent personally by me on the following activities: development of treatment plan with patient and/or surrogate as well as nursing, discussions with consultants, evaluation of patient's response to treatment, examination of patient, obtaining history from patient or surrogate, ordering and performing treatments and interventions, ordering and review of laboratory studies, ordering and review of radiographic studies, pulse oximetry and re-evaluation of patient's condition.  Labs Review Labs Reviewed  CBC WITH DIFFERENTIAL - Abnormal; Notable for the following:    Hemoglobin 11.8 (*)    RDW 15.9 (*)    Neutrophils Relative % 40 (*)    Eosinophils Relative 9 (*)    All other components within normal limits  BASIC METABOLIC PANEL - Abnormal; Notable for the following:    Sodium 136 (*)    Glucose, Bld 110 (*)    GFR calc non Af Amer 75 (*)    GFR calc Af Amer 87 (*)    All other components within normal limits  TROPONIN I    Imaging Review No results found.   EKG Interpretation   Date/Time:  Thursday August 11 2013 00:04:55 EDT Ventricular Rate:  153 PR Interval:    QRS Duration: 83 QT Interval:  302 QTC Calculation: 482 R Axis:   9 Text Interpretation:  Atrial fibrillation Low voltage, precordial leads  Anteroseptal infarct, age indeterminate not Confirmed by Sheila Gervasi  MD, Briget Shaheed  ([id number]) on 08/11/2013 1:04:38 AM      MDM   Final diagnoses:  Atrial fibrillation with rapid ventricular response    43 year old female with new onset A. fib appears to  have gone on for last several days.  Plan for rate control with diltiazem, admission and cardiology consult as patient will not be able to easily be seen in the doctor's office.  1:45 AM  HR has improved to 100s from 170s.  Pt remains asymptomatic.  Hospitalist to admit.   Olivia Mackielga M Jarom Govan, MD 08/11/13 [phone]

## 2013-08-11 NOTE — ED Notes (Signed)
Cardizem rate increased to 10 mg per verbal from Dr. Norlene Campbelltter.

## 2013-08-11 NOTE — H&P (Signed)
[facility] History and Physical  Patient: Michele Weber  UJW:[id number]  DOB: [birth year]  DOS: the patient was seen and examined on 08/11/2013 PCP: Pcp Not In System  Chief Complaint: Increased heart rate  HPI: Michele MowersSharon Weber is a 43 y.o. female with Past medical history of diastolic dysfunction, lymphedema, morbid obesity, recent cellulitis. Patient presented with complaints of increased heart rate. She mentions that since last 2 days she has been noticing that her heart has been racing fast and it has been ranging in the 140s to 150s per minute. She never had similar symptoms in the past. She denies any chest pain or palpitation shortness of breath or dizziness or lightheadedness for nausea or vomiting or dizziness or diarrhea or constipation or abdominal pain. She denies any recent change in her medications. She never had any similar episode in the past. [facility] and due to deconditioning she was admitted to [facility] from there and from there was transferred to nursing home. In the interim she was started on Lasix. She uses CPAP and is compliant with that along with oxygen every night. No prior cardiac workup no prior history of atrial fibrillation.  The patient is coming from home. And at her baseline independent for most of her ADL.  Review of Systems: as mentioned in the history of present illness.  A Comprehensive review of the other systems is negative.  Past Medical History  Diagnosis Date  . Morbid obesity   . Lymphedema of both lower extremities   . Diastolic CHF   . Vitamin D deficiency    History reviewed. No pertinent past surgical history. Social History:  reports that she has never smoked. She does not have any smokeless tobacco history on file. She reports that she does not drink alcohol or use illicit drugs.  No Known Allergies  Family History  Problem Relation Age of Onset  . Diabetes  Father     Prior to Admission medications   Medication Sig Start Date End Date Taking? Authorizing Provider  acetaminophen (TYLENOL 8 HOUR) 650 MG CR tablet Take 650 mg by mouth every 8 (eight) hours as needed for pain.   Yes Historical Provider, MD  Ascorbic Acid (VITAMIN C PO) Take 1 tablet by mouth 2 (two) times daily.   Yes Historical Provider, MD  furosemide (LASIX) 40 MG tablet Take 40 mg by mouth 2 (two) times daily.   Yes Historical Provider, MD  potassium chloride SA (K-DUR,KLOR-CON) 20 MEQ tablet Take 40 mEq by mouth 2 (two) times daily.   Yes Historical Provider, MD    Physical Exam: Filed Vitals:   08/11/13 0145 08/11/13 0200 08/11/13 0239 08/11/13 0341  BP: 96/69 108/62 116/78 115/78  Pulse: 113 142 126 128  Temp:   97.9 F (36.6 C)   TempSrc:   Oral   Resp: 19 18 18    Height:   5' (1.524 m)   SpO2: 97% 97% 96%     General: Alert, Awake and Oriented to Time, Place and Person. Appear in mild distress Eyes: PERRL ENT: Oral Mucosa clear moist. Neck: no JVD Cardiovascular: S1 and S2 Present, no Murmur, Peripheral Pulses Present Respiratory: Bilateral Air entry equal and Decreased, Clear to Auscultation,  no Crackles,no wheezes Abdomen: Bowel Sound Present, Soft and Non tender Skin: no Rash Extremities: Bilateral Pedal edema, no calf tenderness Neurologic: Grossly no focal neuro deficit.  Labs on Admission:  CBC:  Recent Labs Lab 08/11/13 0027  WBC 6.7  NEUTROABS 2.7  HGB 11.8*  HCT 37.3  MCV 92.6  PLT 303    CMP     Component Value Date/Time   NA 136* 08/11/2013 0027   NA 136* 05/13/2013   K 4.1 08/11/2013 0027   CL 98 08/11/2013 0027   CO2 27 08/11/2013 0027   GLUCOSE 110* 08/11/2013 0027   BUN 18 08/11/2013 0027   BUN 11 05/13/2013   CREATININE 0.92 08/11/2013 0027   CREATININE 1.0 05/13/2013   CALCIUM 8.8 08/11/2013 0027   AST 17 05/13/2013   ALT 16 05/13/2013   ALKPHOS 47 05/13/2013   GFRNONAA 75* 08/11/2013 0027   GFRAA 87* 08/11/2013 0027    No  results found for this basename: LIPASE, AMYLASE,  in the last 168 hours No results found for this basename: AMMONIA,  in the last 168 hours   Recent Labs Lab 08/11/13 0027  TROPONINI <0.30   BNP (last 3 results) No results found for this basename: PROBNP,  in the last 8760 hours  Radiological Exams on Admission: No results found.  EKG: Independently reviewed. atrial fibrillation, rapid ventricular rate.  Assessment/Plan Principal Problem:   Atrial fibrillation with RVR Active Problems:   Morbid obesity   Lymphedema of both lower extremities   Diastolic CHF   Unspecified protein-calorie malnutrition   Physical deconditioning   Open wound of knee, leg (except thigh), and ankle, complicated   1. Atrial fibrillation with RVR Patient presents with complaints of increased heart rate she was found to have A. fib with RVR. She has been given Cardizem drip and I would also like Lopressor. She will be monitored on telemetry. Troponin are negative no electrolyte imbalance no respiratory compromise evident at present. Would continue to monitor serial troponins obtain limited echocardiogram and recheck electrolytes. Also check TSH.  2. For sleep apnea Continue CPAP each bedtime.  3. Diastolic dysfunction Holding Lasix and proviing gentle hydration.  4. Morbid obesity Continue to monitor, patient has chronic Foley secondary to the same and also to help with healing decubitus ulcer.  DVT Prophylaxis: subcutaneous Heparin Nutrition: N.p.o. except medication  Code Status: Full  Disposition: Admitted to inpatient in telemetry unit.  Author: Lynden OxfordPranav Patel, MD [facility] Pager: [phone] 08/11/2013, 3:47 AM    If 7PM-7AM, please contact night-coverage [email or web address] Password [email or web address]  **Disclaimer: This note may have been dictated with voice recognition software. Similar sounding words can inadvertently be transcribed and this note may contain transcription errors which  may not have been corrected upon publication of note.**

## 2013-08-11 NOTE — Progress Notes (Signed)
Echocardiogram 2D Echocardiogram has been performed.  Michele BasemanReel, James M 08/11/2013, 11:09 AM

## 2013-08-11 NOTE — Care Management Note (Signed)
    Page 1 of 1   08/15/2013     4:17:58 PM CARE MANAGEMENT NOTE 08/15/2013  Patient:  Michele Weber,Michele Weber   Account Number:  [id number]  Date Initiated:  08/11/2013  Documentation initiated by:  AMERSON,JULIE  Subjective/Objective Assessment:   Pt adm on 08/11/13 with Afib with RVR.  PTA, [facility]     Action/Plan:   CSW consulted to facilitate return to SNF when medically ready for dc.  Will follow progress.   Anticipated DC Date:  08/13/2013   Anticipated DC Plan:  SKILLED NURSING FACILITY  In-house referral  Clinical Social Worker      DC Planning Services  CM consult      Choice offered to / List presented to:             Status of service:  Completed, signed off Medicare Important Message given?  YES (If response is "NO", the following Medicare IM given date fields will be blank) Date Medicare IM given:  08/15/2013 Date Additional Medicare IM given:    Discharge Disposition:  SKILLED NURSING FACILITY  Per UR Regulation:  Reviewed for med. necessity/level of care/duration of stay  If discussed at Long Length of Stay Meetings, dates discussed:    Comments:  08/15/13 Sidney AceJulie Amerson, RN, BSN [phone] Pt discharged to SNF today, per CSW arrangements.

## 2013-08-11 NOTE — Progress Notes (Signed)
ANTICOAGULATION CONSULT NOTE  Pharmacy Consult for Heparin Indication: atrial fibrillation  No Known Allergies  Patient Measurements: Height: 5' (152.4 cm) Weight: 377 lb (171.006 kg) IBW/kg (Calculated) : 45.5 Heparin Dosing Weight: 92kg  Vital Signs: Temp: 98.2 F (36.8 C) (06/25 1533) Temp src: Oral (06/25 1533) BP: 108/63 mmHg (06/25 1533) Pulse Rate: 78 (06/25 1533)  Labs:  Recent Labs  08/11/13 0027 08/11/13 0402 08/11/13 1052 08/11/13 1625 08/11/13 1909  HGB 11.8* 10.8*  --   --   --   HCT 37.3 34.5*  --   --   --   PLT 303 298  --   --   --   LABPROT  --  14.4  --   --   --   INR  --  1.12  --   --   --   HEPARINUNFRC  --   --   --   --  <0.10*  CREATININE 0.92 0.89  --   --   --   TROPONINI <0.30 <0.30 <0.30 <0.30  --     Estimated Creatinine Clearance: 121.9 ml/min (by C-G formula based on Cr of 0.89).   Medical History: Past Medical History  Diagnosis Date  . Morbid obesity   . Lymphedema of both lower extremities   . Diastolic CHF   . Vitamin D deficiency     Medications:  Prescriptions prior to admission  Medication Sig Dispense Refill  . acetaminophen (TYLENOL 8 HOUR) 650 MG CR tablet Take 650 mg by mouth every 8 (eight) hours as needed for pain.      . Ascorbic Acid (VITAMIN C PO) Take 1 tablet by mouth 2 (two) times daily.      . furosemide (LASIX) 40 MG tablet Take 40 mg by mouth 2 (two) times daily.      . potassium chloride SA (K-DUR,KLOR-CON) 20 MEQ tablet Take 40 mEq by mouth 2 (two) times daily.        Assessment: Michele Weber to start heparin for new-onset Afib. Cardiology has been consulted - awaiting ECHO for consideration of NOACs. - Baseline INR: 1.12 - Hg 10.8, Plts wnl - No significant bleeding reported  Initial heparin level < 0.10  Goal of Therapy:  Heparin level 0.3-0.7 units/ml Monitor platelets by anticoagulation protocol: Yes   Plan:  Heparin IV bolus 3000 units x 1 Heparin drip to 1700 units/hr (17 ml/hr) Follow  up AM labs  Thank you. Okey RegalLisa Powell, PharmD [phone]  08/11/2013,8:08 PM

## 2013-08-11 NOTE — Progress Notes (Signed)
ANTICOAGULATION CONSULT NOTE - Initial Consult  Pharmacy Consult for Heparin Indication: atrial fibrillation  No Known Allergies  Patient Measurements: Height: 5' (152.4 cm) Weight: 377 lb (171.006 kg) IBW/kg (Calculated) : 45.5 Heparin Dosing Weight: 92kg  Vital Signs: Temp: 97.9 F (36.6 C) (06/25 0239) Temp src: Oral (06/25 0239) BP: 115/78 mmHg (06/25 0341) Pulse Rate: 128 (06/25 0341)  Labs:  Recent Labs  08/11/13 0027 08/11/13 0402  HGB 11.8* 10.8*  HCT 37.3 34.5*  PLT 303 298  LABPROT  --  14.4  INR  --  1.12  CREATININE 0.92 0.89  TROPONINI <0.30 <0.30    Estimated Creatinine Clearance: 121.9 ml/min (by C-G formula based on Cr of 0.89).   Medical History: Past Medical History  Diagnosis Date  . Morbid obesity   . Lymphedema of both lower extremities   . Diastolic CHF   . Vitamin D deficiency     Medications:  Prescriptions prior to admission  Medication Sig Dispense Refill  . acetaminophen (TYLENOL 8 HOUR) 650 MG CR tablet Take 650 mg by mouth every 8 (eight) hours as needed for pain.      . Ascorbic Acid (VITAMIN C PO) Take 1 tablet by mouth 2 (two) times daily.      . furosemide (LASIX) 40 MG tablet Take 40 mg by mouth 2 (two) times daily.      . potassium chloride SA (K-DUR,KLOR-CON) 20 MEQ tablet Take 40 mEq by mouth 2 (two) times daily.        Assessment: 43yof to start heparin for new-onset Afib. Cardiology has been consulted - awaiting ECHO for consideration of NOACs. - Baseline INR: 1.12 - Hg 10.8, Plts wnl - No significant bleeding reported  Goal of Therapy:  Heparin level 0.3-0.7 units/ml Monitor platelets by anticoagulation protocol: Yes   Plan:  1. Heparin IV bolus 5000 units x 1 2. Heparin drip 1400 units/hr (14 ml/hr) 3. Check heparin level 6 hours after initiation 4. Daily heparin level and CBC  Cleon DewDulaney, [place] Robert [phone] 08/11/2013,10:47 AM

## 2013-08-11 NOTE — Consult Note (Signed)
Reason for Consult:atrial fibrillation with rapid ventricular response Referring Physician: [facility]  Michele Weber is an 43 y.o. female.  XKG:YJEHUDJ is 43 year old female with past medical history significant for morbid obesity, obstructive sleep apnea on CPAP, history of lymphedema of lower extremities, history of congestive heart failure secondary to preserved LV systolic function,vitamin D deficiency, chronic anemia, history of recent cellulitis, was admitted yesterday because of palpitation off and on since Sunday.  Patient resident of [facility] noted by nurse to have irregular heart beat with heart rate in the 120s and 30s per patient on Sunday did not seek any medical attention but felt tired and fatigued late in the afternoon yesterday and so decided to come to the ER and was noted to be in A. Fib with RVR heart rate in the 140s and 50s.  Patient received IV Cardizem and started on by mouth Lopressor with control of heart rate in 70s to 80s.  Patient denies any chest pain nausea vomiting diaphoresis.  Denies PND orthopnea or leg swelling.  States occasional feels skipping of the heart beat but could not tell her heart was beating that fast.  Denies such episodes of palpitations in the past.  Denies shortness of breath.  Denies any leg swelling.  Past Medical History  Diagnosis Date  . Morbid obesity   . Lymphedema of both lower extremities   . Diastolic CHF   . Vitamin D deficiency     History reviewed. No pertinent past surgical history.  Family History  Problem Relation Age of Onset  . Diabetes Father     Social History:  reports that she has never smoked. She does not have any smokeless tobacco history on file. She reports that she does not drink alcohol or use illicit drugs.  Allergies: No Known Allergies  Medications: I have reviewed the patient's current medications.  Results for orders placed during the hospital encounter of 08/11/13 (from the past 48 hour(s))   CBC WITH DIFFERENTIAL     Status: Abnormal   Collection Time    08/11/13 12:27 AM      Result Value Ref Range   WBC 6.7  4.0 - 10.5 K/uL   RBC 4.03  3.87 - 5.11 MIL/uL   Hemoglobin 11.8 (*) 12.0 - 15.0 g/dL   HCT 37.3  36.0 - 46.0 %   MCV 92.6  78.0 - 100.0 fL   MCH 29.3  26.0 - 34.0 pg   MCHC 31.6  30.0 - 36.0 g/dL   RDW 15.9 (*) 11.5 - 15.5 %   Platelets 303  150 - 400 K/uL   Neutrophils Relative % 40 (*) 43 - 77 %   Neutro Abs 2.7  1.7 - 7.7 K/uL   Lymphocytes Relative 41  12 - 46 %   Lymphs Abs 2.7  0.7 - 4.0 K/uL   Monocytes Relative 10  3 - 12 %   Monocytes Absolute 0.7  0.1 - 1.0 K/uL   Eosinophils Relative 9 (*) 0 - 5 %   Eosinophils Absolute 0.6  0.0 - 0.7 K/uL   Basophils Relative 0  0 - 1 %   Basophils Absolute 0.0  0.0 - 0.1 K/uL  BASIC METABOLIC PANEL     Status: Abnormal   Collection Time    08/11/13 12:27 AM      Result Value Ref Range   Sodium 136 (*) 137 - 147 mEq/L   Potassium 4.1  3.7 - 5.3 mEq/L   Chloride 98  96 -  112 mEq/L   CO2 27  19 - 32 mEq/L   Glucose, Bld 110 (*) 70 - 99 mg/dL   BUN 18  6 - 23 mg/dL   Creatinine, Ser 0.92  0.50 - 1.10 mg/dL   Calcium 8.8  8.4 - 10.5 mg/dL   GFR calc non Af Amer 75 (*) >90 mL/min   GFR calc Af Amer 87 (*) >90 mL/min   Comment: (NOTE)     The eGFR has been calculated using the CKD EPI equation.     This calculation has not been validated in all clinical situations.     eGFR's persistently <90 mL/min signify possible Chronic Kidney     Disease.  TROPONIN I     Status: None   Collection Time    08/11/13 12:27 AM      Result Value Ref Range   Troponin I <0.30  <0.30 ng/mL   Comment:            Due to the release kinetics of cTnI,     a negative result within the first hours     of the onset of symptoms does not rule out     myocardial infarction with certainty.     If myocardial infarction is still suspected,     repeat the test at appropriate intervals.  MRSA PCR SCREENING     Status: Abnormal    Collection Time    08/11/13  2:42 AM      Result Value Ref Range   MRSA by PCR POSITIVE (*) NEGATIVE   Comment:            The GeneXpert MRSA Assay (FDA     approved for NASAL specimens     only), is one component of a     comprehensive MRSA colonization     surveillance program. It is not     intended to diagnose MRSA     infection nor to guide or     monitor treatment for     MRSA infections.     RESULT CALLED TO, READ BACK BY AND VERIFIED WITH:     K SANDERS,RN 08/11/13 0518 RHOLMES  TROPONIN I     Status: None   Collection Time    08/11/13  4:02 AM      Result Value Ref Range   Troponin I <0.30  <0.30 ng/mL   Comment:            Due to the release kinetics of cTnI,     a negative result within the first hours     of the onset of symptoms does not rule out     myocardial infarction with certainty.     If myocardial infarction is still suspected,     repeat the test at appropriate intervals.  COMPREHENSIVE METABOLIC PANEL     Status: Abnormal   Collection Time    08/11/13  4:02 AM      Result Value Ref Range   Sodium 139  137 - 147 mEq/L   Potassium 4.5  3.7 - 5.3 mEq/L   Chloride 101  96 - 112 mEq/L   CO2 30  19 - 32 mEq/L   Glucose, Bld 110 (*) 70 - 99 mg/dL   BUN 17  6 - 23 mg/dL   Creatinine, Ser 0.89  0.50 - 1.10 mg/dL   Calcium 8.6  8.4 - 10.5 mg/dL   Total Protein 7.7  6.0 - 8.3 g/dL   Albumin 2.4 (*)  3.5 - 5.2 g/dL   AST 8  0 - 37 U/L   ALT <5  0 - 35 U/L   Alkaline Phosphatase 46  39 - 117 U/L   Total Bilirubin 0.3  0.3 - 1.2 mg/dL   GFR calc non Af Amer 78 (*) >90 mL/min   GFR calc Af Amer >90  >90 mL/min   Comment: (NOTE)     The eGFR has been calculated using the CKD EPI equation.     This calculation has not been validated in all clinical situations.     eGFR's persistently <90 mL/min signify possible Chronic Kidney     Disease.  CBC     Status: Abnormal   Collection Time    08/11/13  4:02 AM      Result Value Ref Range   WBC 6.5  4.0 - 10.5 K/uL    RBC 3.79 (*) 3.87 - 5.11 MIL/uL   Hemoglobin 10.8 (*) 12.0 - 15.0 g/dL   HCT 75.3 (*) 87.2 - 35.7 %   MCV 91.0  78.0 - 100.0 fL   MCH 28.5  26.0 - 34.0 pg   MCHC 31.3  30.0 - 36.0 g/dL   RDW 50.2 (*) 68.0 - 61.4 %   Platelets 298  150 - 400 K/uL  PROTIME-INR     Status: None   Collection Time    08/11/13  4:02 AM      Result Value Ref Range   Prothrombin Time 14.4  11.6 - 15.2 seconds   INR 1.12  0.00 - 1.49  TSH     Status: None   Collection Time    08/11/13  4:02 AM      Result Value Ref Range   TSH 3.980  0.350 - 4.500 uIU/mL  MAGNESIUM     Status: None   Collection Time    08/11/13  4:02 AM      Result Value Ref Range   Magnesium 1.8  1.5 - 2.5 mg/dL    Dg Chest Port 1 View  08/11/2013   CLINICAL DATA:  Shortness of breath  EXAM: PORTABLE CHEST - 1 VIEW  COMPARISON:  None.  FINDINGS: There is mild cardiomegaly present with possible mild pulmonary vascular congestion. No infiltrate or effusion is seen. No bony abnormality is noted  IMPRESSION: Mild cardiomegaly with question of mild pulmonary vascular congestion.   Electronically Signed   By: Dwyane Dee M.D.   On: 08/11/2013 08:18    Review of Systems  Constitutional: Negative for fever and chills.  Eyes: Positive for double vision. Negative for photophobia.  Respiratory: Negative for cough, hemoptysis, sputum production and shortness of breath.   Cardiovascular: Positive for palpitations. Negative for chest pain, orthopnea, claudication, leg swelling and PND.  Gastrointestinal: Negative for nausea, vomiting and abdominal pain.  Genitourinary: Negative for dysuria.  Neurological: Negative for dizziness and headaches.   Blood pressure 115/78, pulse 128, temperature 97.9 F (36.6 C), temperature source Oral, resp. rate 18, height 5' (1.524 m), weight 171.006 kg (377 lb), last menstrual period 08/10/2013, SpO2 96.00%. Physical Exam  Constitutional: She is oriented to person, place, and time.  Eyes: Conjunctivae are  normal. Pupils are equal, round, and reactive to light. Left eye exhibits no discharge. No scleral icterus.  Neck: Normal range of motion. Neck supple. No JVD present. No tracheal deviation present. No thyromegaly present.  Cardiovascular:  Irregularly irregular S1 and S2 soft  Respiratory:  Decreased breath sounds at bases  GI: Soft. She exhibits  distension. There is no tenderness.  Musculoskeletal:  Chronic massive bilateral lymphedema  Neurological: She is alert and oriented to person, place, and time.    Assessment/Plan: New-onset A. Fib with RVR rate now better controlled with beta blockers and Cardizem Obstructive sleep apnea on CPAP Morbid obesity Chronic bilateral lymphedema Compensated diastolic congestive heart failure Vitamin D deficiency Anemia History of recent cellulitis Decubitus ulcers Plan Agree with present management Discussed with patient at length regarding rate control versus rhythm control using TEE assisted cardioversion, patient wanted to be treated medically with rate control only. Check 2-D echo if no evidence of valvular atrial fibrillation consider switching to novel  oral anticoagulants long-term in view of other comorbidities.  HARWANI,MOHAN N 08/11/2013, 10:22 AM

## 2013-08-11 NOTE — Progress Notes (Signed)
UR Completed.  Brown, Sarah Jane [phone] 08/11/2013  

## 2013-08-11 NOTE — Progress Notes (Signed)
Patient admitted after midnight- A/p Atrial fibrillation with RVR   Cardizem drip and start Lopressor.   telemetry.  Echocardiogram TSH ok  Cards consult as patient will need outpatient follow up- spoke with Dr. Sharyn LullHarwani Anticoagulation with IV heparin for now until echo back  For sleep apnea  Continue CPAP each bedtime.    Diastolic dysfunction  Holding Lasix and proviing gentle hydration.   Morbid obesity  Continue to monitor, patient has chronic Foley secondary to the same and also to help with healing decubitus ulcer  Was hospitalized in [place] and then [facility]  Marlin CanaryJessica Aren Cherne DO

## 2013-08-12 DIAGNOSIS — D649 Anemia, unspecified: Secondary | ICD-10-CM

## 2013-08-12 LAB — CBC
HCT: 33.1 % — ABNORMAL LOW (ref 36.0–46.0)
Hemoglobin: 10.3 g/dL — ABNORMAL LOW (ref 12.0–15.0)
MCH: 28.3 pg (ref 26.0–34.0)
MCHC: 31.1 g/dL (ref 30.0–36.0)
MCV: 90.9 fL (ref 78.0–100.0)
Platelets: 295 10*3/uL (ref 150–400)
RBC: 3.64 MIL/uL — AB (ref 3.87–5.11)
RDW: 16 % — ABNORMAL HIGH (ref 11.5–15.5)
WBC: 6.1 10*3/uL (ref 4.0–10.5)

## 2013-08-12 LAB — BASIC METABOLIC PANEL
BUN: 15 mg/dL (ref 6–23)
CO2: 27 mEq/L (ref 19–32)
Calcium: 8.6 mg/dL (ref 8.4–10.5)
Chloride: 100 mEq/L (ref 96–112)
Creatinine, Ser: 0.78 mg/dL (ref 0.50–1.10)
GFR calc Af Amer: >90 mL/min (ref >90)
GFR calc non Af Amer: >90 mL/min (ref >90)
Glucose, Bld: 104 mg/dL — ABNORMAL HIGH (ref 70–99)
Potassium: 4.2 mEq/L (ref 3.7–5.3)
Sodium: 138 mEq/L (ref 137–147)

## 2013-08-12 LAB — HEPARIN LEVEL (UNFRACTIONATED): Heparin Unfractionated: 0.2 IU/mL — ABNORMAL LOW (ref 0.30–0.70)

## 2013-08-12 MED ORDER — HEPARIN BOLUS VIA INFUSION
3000.0000 [IU] | Freq: Once | INTRAVENOUS | Status: AC
  Administered 2013-08-12: 3000 [IU] via INTRAVENOUS
  Filled 2013-08-12: qty 3000

## 2013-08-12 MED ORDER — HEPARIN (PORCINE) IN NACL 100-0.45 UNIT/ML-% IJ SOLN
2400.0000 [IU]/h | INTRAMUSCULAR | Status: AC
  Administered 2013-08-12: 2400 [IU]/h via INTRAVENOUS
  Filled 2013-08-12: qty 250

## 2013-08-12 MED ORDER — METOPROLOL TARTRATE 25 MG PO TABS
37.5000 mg | ORAL_TABLET | Freq: Three times a day (TID) | ORAL | Status: DC
Start: 2013-08-12 — End: 2013-08-13
  Administered 2013-08-12: 37.5 mg via ORAL
  Filled 2013-08-12 (×6): qty 1

## 2013-08-12 MED ORDER — FUROSEMIDE 40 MG PO TABS
40.0000 mg | ORAL_TABLET | Freq: Two times a day (BID) | ORAL | Status: DC
  Administered 2013-08-12 – 2013-08-15 (×5): 40 mg via ORAL
  Filled 2013-08-12 (×8): qty 1

## 2013-08-12 MED ORDER — DILTIAZEM HCL ER COATED BEADS 180 MG PO CP24
180.0000 mg | ORAL_CAPSULE | Freq: Every day | ORAL | Status: DC
  Administered 2013-08-12 – 2013-08-15 (×4): 180 mg via ORAL
  Filled 2013-08-12 (×4): qty 1

## 2013-08-12 MED ORDER — POTASSIUM CHLORIDE CRYS ER 20 MEQ PO TBCR
40.0000 meq | EXTENDED_RELEASE_TABLET | Freq: Two times a day (BID) | ORAL | Status: DC
  Administered 2013-08-12 – 2013-08-15 (×7): 40 meq via ORAL
  Filled 2013-08-12 (×8): qty 2

## 2013-08-12 MED ORDER — RIVAROXABAN 20 MG PO TABS
20.0000 mg | ORAL_TABLET | Freq: Every day | ORAL | Status: DC
  Administered 2013-08-12 – 2013-08-14 (×3): 20 mg via ORAL
  Filled 2013-08-12 (×5): qty 1

## 2013-08-12 NOTE — Consult Note (Signed)
WOC wound consult note Reason for Consult: evaluation of wound on the inner thigh. Pt with significant lymphedema. She reports only receiving this diagnosis within last year. She has never been evaluated or treated by a lymphedema therapist.  I will provide her with resource list, however with her being in a SNF transportation and mobility are a huge issue for her. She is followed by Dr. Leanord Hawkingobson in the SNF, who is one of the wound care center MD's as well.  Wound type: skin ulceration left inner upper thigh related to skin changes from lymphedema. She has multiple pendulous area on her bilateral inner legs and severe lymphedema.    Pressure Ulcer POA: No Measurement:4.5cm x 3.0cm x 0.2cm  Wound ZOX:WRUEAVWbed:fissure like tracks on the inner thigh with pale wound bed, some fibrin along the tracks and an intact skin island between the open areas Drainage (amount, consistency, odor) minimal, serous, non foul Periwound: intact, with Peau d'orange skin appearance over much of the inner legs  Dressing procedure/placement/frequency: continue silver hydrofiber as this has been working well in the facility. Either hold in place with dryflow underpad or dry dressing and tape whichever the patient prefers.    Noted: patient in bariatric bed, however due to her girth she is not currently able to assist with turning at all. The current bed and mattress are not wide enough to allow for space for her turn even if staff turned her.  I have spoke to portable equipment to request mattress replacement that will fit when the bed side are fully extended.  They will request this to be delivered to the patient's room for use.   Discussed POC with patient and bedside nurse.  Re consult if needed, will not follow at this time. Thanks  Melody Foot Lockerustin RN, CWOCN [phone])

## 2013-08-12 NOTE — Progress Notes (Signed)
Pt was placed on CPAP with a small FFM. Settings of 14 cmH2O. Pt has 2 LPM O2 bled in. Pt is comfortable. SPO2 is 100%.

## 2013-08-12 NOTE — Clinical Social Work Note (Signed)
Clinical Social Work Department BRIEF PSYCHOSOCIAL ASSESSMENT 08/12/2013  Patient:  Michele Weber, Michele Weber     Account Number:  [id number]     [place] date:  08/11/2013  Clinical Social Worker:  Myles Lipps  Date/Time:  08/12/2013 03:00 PM  Referred by:  Care Management  Date Referred:  08/12/2013 Referred for  SNF Placement   Other Referral:   Interview type:  Patient Other interview type:   No family/friends at bedside    PSYCHOSOCIAL DATA Living Status:  FACILITY Admitted from facility:  [facility] Level of care:  [place] Primary support name:  Louie Casa  [phone] Primary support relationship to patient:  CHILD, ADULT Degree of support available:   Adequate    CURRENT CONCERNS Current Concerns  Post-Acute Placement   Other Concerns:    SOCIAL WORK ASSESSMENT / PLAN Clinical Social Worker met with patient at bedside to offer support and discuss patient needs at discharge.  Patient states that she is from [facility] and plans to return at discharge.  CSW spoke with Elzie Rings at [facility] who states that patient is able to return but due to the staff needs for movement of patient will need to be a weekday admission.  Per facility, patient bed is a rented bed that can accommodate her size and will need to be returned if patient is not able to discharge on Monday 08/15/2013 for any reason.  CSW contacted [facility] who states that they will be able to provide transportation but to notify in the morning of transport needs in order to arrange to have the appropriate bed size.  CSW remains available for support and to facilitate patient discharge needs once medically ready.   Assessment/plan status:  Psychosocial Support/Ongoing Assessment of Needs Other assessment/ plan:   Information/referral to community resources:   Clinical Social Worker notified [facility] of possible transport needs and worked diligently with facility and patient to avoid bed hold  fees.    PATIENT'S/FAMILY'S RESPONSE TO PLAN OF CARE: Patient alert and oriented x3 sitting up in bed.  Patient states that none of her family is local at this time and she will update them of her plans over the phone.  Patient states that she is agreeable to return to [facility] for rehab needs.  Patient did not address limitations due to weight and/or the need for SNF but willing to continue stay.  Patient understanding of social work role and verbalized appreciation for support and concern.

## 2013-08-12 NOTE — Discharge Instructions (Signed)

## 2013-08-12 NOTE — Progress Notes (Addendum)
PROGRESS NOTE  Michele MowersSharon Weber NGE:[id number] DOB: 06/12/1970 DOA: 08/11/2013 PCP: Pcp Not In System  Michele Weber is a 43 y.o. female with Past medical history of diastolic dysfunction, lymphedema, morbid obesity, recent cellulitis.  Patient presented with complaints of increased heart rate. She mentions that since last 2 days she has been noticing that her heart has been racing fast and it has been ranging in the 140s to 150s per minute. She never had similar symptoms in the past. She denies any chest pain or palpitation shortness of breath or dizziness or lightheadedness for nausea or vomiting or dizziness or diarrhea or constipation or abdominal pain. She denies any recent change in her medications. She never had any similar episode in the past.  [facility] and due to deconditioning she was admitted to [facility] from there and from there was transferred to nursing home. In the interim she was started on Lasix. She uses CPAP and is compliant with that along with oxygen every night.  No prior cardiac workup no prior history of atrial fibrillation  Assessment/Plan: Atrial fibrillation with RVR  Cardizem drip and start Lopressor- wean off cardizem telemetry.  Echocardiogram ok  TSH ok  Dr. Sharyn LullHarwani  Anticoagulation with IV heparin for now- unsure due to size what anticoagulation patient can be on- eliquis???  sleep apnea  Continue CPAP each bedtime.   Diastolic dysfunction  Resume lasix Weights here not accurate  Morbid obesity  Continue to monitor, patient has chronic Foley secondary to the same and also to help with healing decubitus ulcer   LE wound- wound care consult  PT consult  Was hospitalized in [place] and then [facility] then [place]   Code Status: full Family Communication: patient Disposition Plan: SNF on monday   Consultants:  cards  Procedures:  echo    HPI/Subjective: No new  c/o  Objective: Filed Vitals:   08/12/13 0441  BP: 111/62  Pulse: 75  Temp: 97.8 F (36.6 C)  Resp: 18    Intake/Output Summary (Last 24 hours) at 08/12/13 1116 Last data filed at 08/12/13 0900  Gross per 24 hour  Intake   1339 ml  Output   4551 ml  Net  -3212 ml   Filed Weights   08/11/13 0239  Weight: 171.006 kg (377 lb)    Exam:   General:  A+Ox3, NAd  Cardiovascular: irr  Respiratory: clear  Abdomen: +Bs, soft  Musculoskeletal: large LE, weeping wound on posterior thigh   Data Reviewed: Basic Metabolic Panel:  Recent Labs Lab 08/11/13 0027 08/11/13 0402 08/12/13 0040  NA 136* 139 138  K 4.1 4.5 4.2  CL 98 101 100  CO2 27 30 27   GLUCOSE 110* 110* 104*  BUN 18 17 15   CREATININE 0.92 0.89 0.78  CALCIUM 8.8 8.6 8.6  MG  --  1.8  --    Liver Function Tests:  Recent Labs Lab 08/11/13 0402  AST 8  ALT <5  ALKPHOS 46  BILITOT 0.3  PROT 7.7  ALBUMIN 2.4*   No results found for this basename: LIPASE, AMYLASE,  in the last 168 hours No results found for this basename: AMMONIA,  in the last 168 hours CBC:  Recent Labs Lab 08/11/13 0027 08/11/13 0402 08/12/13 0040  WBC 6.7 6.5 6.1  NEUTROABS 2.7  --   --   HGB 11.8* 10.8* 10.3*  HCT 37.3 34.5* 33.1*  MCV 92.6 91.0 90.9  PLT 303 298  295   Cardiac Enzymes:  Recent Labs Lab 08/11/13 0027 08/11/13 0402 08/11/13 1052 08/11/13 1625  TROPONINI <0.30 <0.30 <0.30 <0.30   BNP (last 3 results) No results found for this basename: PROBNP,  in the last 8760 hours CBG: No results found for this basename: GLUCAP,  in the last 168 hours  Recent Results (from the past 240 hour(s))  MRSA PCR SCREENING     Status: Abnormal   Collection Time    08/11/13  2:42 AM      Result Value Ref Range Status   MRSA by PCR POSITIVE (*) NEGATIVE Final   Comment:            The GeneXpert MRSA Assay (FDA     approved for NASAL specimens     only), is one component of a     comprehensive MRSA colonization      surveillance program. It is not     intended to diagnose MRSA     infection nor to guide or     monitor treatment for     MRSA infections.     RESULT CALLED TO, READ BACK BY AND VERIFIED WITH:     Sharyne PeachK SANDERS,RN 08/11/13 0518 RHOLMES     Studies: Dg Chest Port 1 View  08/11/2013   CLINICAL DATA:  Shortness of breath  EXAM: PORTABLE CHEST - 1 VIEW  COMPARISON:  None.  FINDINGS: There is mild cardiomegaly present with possible mild pulmonary vascular congestion. No infiltrate or effusion is seen. No bony abnormality is noted  IMPRESSION: Mild cardiomegaly with question of mild pulmonary vascular congestion.   Electronically Signed   By: Dwyane DeePaul  Barry M.D.   On: 08/11/2013 08:18    Scheduled Meds: . metoprolol tartrate  37.5 mg Oral TID  . sodium chloride  3 mL Intravenous Q12H   Continuous Infusions: . diltiazem (CARDIZEM) infusion 5 mg/hr (08/11/13 1725)  . heparin 2,400 Units/hr (08/12/13 0847)   Antibiotics Given (last 72 hours)   None      Principal Problem:   Atrial fibrillation with RVR Active Problems:   Morbid obesity   Lymphedema of both lower extremities   Diastolic CHF   Unspecified protein-calorie malnutrition   Physical deconditioning   Open wound of knee, leg (except thigh), and ankle, complicated    Time spent: 25    Marlin CanaryVANN, JESSICA  [facility] Pager [phone]. If 7PM-7AM, please contact night-coverage at [email or web address], password [email or web address] 08/12/2013, 11:16 AM  LOS: 1 day

## 2013-08-12 NOTE — Progress Notes (Signed)
Subjective:  Patient denies any chest pain or shortness of breath. Denies any palpitation. Remains in A. fib with controlled ventricular response Cardizem is being weaned off  Objective:  Vital Signs in the last 24 hours: Temp:  [97.8 F (36.6 C)-98.2 F (36.8 C)] 97.8 F (36.6 C) (06/26 0441) Pulse Rate:  [75-88] 75 (06/26 0441) Resp:  [18-20] 18 (06/26 0441) BP: (108-111)/(56-63) 111/62 mmHg (06/26 0441) SpO2:  [95 %-99 %] 99 % (06/26 0441)  Intake/Output from previous day: 06/25 0701 - 06/26 0700 In: 1099 [P.O.:840; I.V.:259] Out: 3850 [Urine:3850] Intake/Output from this shift: Total I/O In: 240 [P.O.:240] Out: 701 [Urine:700; Stool:1]  Physical Exam: Neck: no adenopathy, no carotid bruit, no JVD and supple, symmetrical, trachea midline Lungs: clear to auscultation bilaterally Heart: irregularly irregular rhythm, S1, S2 normal and Soft systolic murmur noted Abdomen: Soft distended nontender Extremities: Chronic bilateral lymphedema  Lab Results:  Recent Labs  08/11/13 0402 08/12/13 0040  WBC 6.5 6.1  HGB 10.8* 10.3*  PLT 298 295    Recent Labs  08/11/13 0402 08/12/13 0040  NA 139 138  K 4.5 4.2  CL 101 100  CO2 30 27  GLUCOSE 110* 104*  BUN 17 15  CREATININE 0.89 0.78    Recent Labs  08/11/13 1052 08/11/13 1625  TROPONINI <0.30 <0.30   Hepatic Function Panel  Recent Labs  08/11/13 0402  PROT 7.7  ALBUMIN 2.4*  AST 8  ALT <5  ALKPHOS 46  BILITOT 0.3   No results found for this basename: CHOL,  in the last 72 hours No results found for this basename: PROTIME,  in the last 72 hours  Imaging: Imaging results have been reviewed and Dg Chest Port 1 View  08/11/2013   CLINICAL DATA:  Shortness of breath  EXAM: PORTABLE CHEST - 1 VIEW  COMPARISON:  None.  FINDINGS: There is mild cardiomegaly present with possible mild pulmonary vascular congestion. No infiltrate or effusion is seen. No bony abnormality is noted  IMPRESSION: Mild cardiomegaly  with question of mild pulmonary vascular congestion.   Electronically Signed   By: Dwyane DeePaul  Barry M.D.   On: 08/11/2013 08:18    Cardiac Studies:  Assessment/Plan:  New-onset A. Fib with RVR rate now better controlled with beta blockers and Cardizem  Obstructive sleep apnea on CPAP  Morbid obesity  Chronic bilateral lymphedema  Compensated diastolic congestive heart failure  Vitamin D deficiency  Anemia  History of recent cellulitis  Decubitus ulcers Plan DC IV Cardizem switched to by mouth as per orders DC IV heparin and start xarelto as per orders Okay to discharge from cardial point of view Followup with me in 2 weeks  LOS: 1 day    Rosita Guzzetta N 08/12/2013, 12:15 PM

## 2013-08-12 NOTE — Progress Notes (Signed)
Weigh patient x 2 in bed 642 lbs. weights much more than last night.

## 2013-08-12 NOTE — Progress Notes (Signed)
ANTICOAGULATION CONSULT NOTE - Follow Up Consult  Pharmacy Consult for Heparin  Indication: atrial fibrillation  No Known Allergies  Patient Measurements: Height: 5' (152.4 cm) Weight: 377 lb (171.006 kg) IBW/kg (Calculated) : 45.5 Heparin Dosing Weight: 92 kg   Vital Signs: Temp: 98.2 F (36.8 C) (06/25 2156) Temp src: Oral (06/25 2156) BP: 109/56 mmHg (06/25 2156) Pulse Rate: 88 (06/25 2156)  Labs:  Recent Labs  08/11/13 0027 08/11/13 0402 08/11/13 1052 08/11/13 1625 08/11/13 1909 08/12/13 0040  HGB 11.8* 10.8*  --   --   --  10.3*  HCT 37.3 34.5*  --   --   --  33.1*  PLT 303 298  --   --   --  295  LABPROT  --  14.4  --   --   --   --   INR  --  1.12  --   --   --   --   HEPARINUNFRC  --   --   --   --  <0.10* 0.20*  CREATININE 0.92 0.89  --   --   --   --   TROPONINI <0.30 <0.30 <0.30 <0.30  --   --     Estimated Creatinine Clearance: 121.9 ml/min (by C-G formula based on Cr of 0.89).   Medications:  Heparin 1700 units/hr  Assessment: 43 y/o F on heparin for afib, HL is 0.2, other labs as above.   Goal of Therapy:  Heparin level 0.3-0.7 units/ml Monitor platelets by anticoagulation protocol: Yes   Plan:  -Increase heparin drip to 2000 units/hr -0800 HL -Daily CBC/HL -Monitor for bleeding  Abran DukeLedford, James 08/12/2013,1:31 AM

## 2013-08-12 NOTE — Progress Notes (Addendum)
ANTICOAGULATION CONSULT NOTE - Follow Up Consult  Pharmacy Consult for Heparin Indication: atrial fibrillation  No Known Allergies  Patient Measurements: Height: 5' (152.4 cm) Weight: 377 lb (171.006 kg) IBW/kg (Calculated) : 45.5 Heparin Dosing Weight: ~100kg  Vital Signs: Temp: 97.8 F (36.6 C) (06/26 0441) Temp src: Oral (06/26 0441) BP: 111/62 mmHg (06/26 0441) Pulse Rate: 75 (06/26 0441)  Labs:  Recent Labs  08/11/13 0027 08/11/13 0402 08/11/13 1052 08/11/13 1625 08/11/13 1909 08/12/13 0040 08/12/13 0740  HGB 11.8* 10.8*  --   --   --  10.3*  --   HCT 37.3 34.5*  --   --   --  33.1*  --   PLT 303 298  --   --   --  295  --   LABPROT  --  14.4  --   --   --   --   --   INR  --  1.12  --   --   --   --   --   HEPARINUNFRC  --   --   --   --  <0.10* 0.20* <0.10*  CREATININE 0.92 0.89  --   --   --  0.78  --   TROPONINI <0.30 <0.30 <0.30 <0.30  --   --   --     Estimated Creatinine Clearance: 135.6 ml/min (by C-G formula based on Cr of 0.78).   Medications:  Heparin 2000 units/hr  Assessment: 43yof continues on heparin for new onset Afib. Heparin level (<0.1) remains undetectable (last level of 0.2 was most likely due to bolus effect as it was just 3-4 hours after heparin bolus given). Reported weights have varied (375lbs - 642lbs). Will plan to rebolus, increase rate and follow-up Cardiology discussion for long term anticoagulation. - H/H and Plts stable - No significant bleeding reported - No problems with line/infusion per RN  Goal of Therapy:  Heparin level 0.3-0.7 units/ml Monitor platelets by anticoagulation protocol: Yes   Plan:  1. Heparin IV bolus 3000 units x 1 2. Increase heparin drip to 2400 units/hr (24 ml/hr) 3. Check heparin level 6 hours after rate increase 4. Daily heparin level and CBC  Cleon DewDulaney, [employer] Robert [phone] 08/12/2013,8:22 AM   Addendum:  Cardiology has evaluated patient and has consulted pharmacy to transition patient  from heparin to Xarelto for Afib. Per literature, body weight extremes do not significantly affect Xarelto exposure.  - Hg 10.3, Plts stable - LFTs wnl - CrCl >100 ml/min  Plan: 1. Discontinue heparin at time of Xarelto administration 2. Xarelto 20mg  PO daily with supper - first dose tonight 3. Discontinue heparin labs  Wilfred LacyWesley Dulaney, PharmD Clinical Pharmacist [phone] 08/12/2013, 1:02 PM

## 2013-08-13 DIAGNOSIS — R5381 Other malaise: Secondary | ICD-10-CM

## 2013-08-13 LAB — CBC
HCT: 32.6 % — ABNORMAL LOW (ref 36.0–46.0)
HEMOGLOBIN: 9.8 g/dL — AB (ref 12.0–15.0)
MCH: 27.5 pg (ref 26.0–34.0)
MCHC: 30.1 g/dL (ref 30.0–36.0)
MCV: 91.6 fL (ref 78.0–100.0)
Platelets: 274 10*3/uL (ref 150–400)
RBC: 3.56 MIL/uL — AB (ref 3.87–5.11)
RDW: 16.1 % — ABNORMAL HIGH (ref 11.5–15.5)
WBC: 6.2 10*3/uL (ref 4.0–10.5)

## 2013-08-13 MED ORDER — METOPROLOL TARTRATE 25 MG PO TABS
37.5000 mg | ORAL_TABLET | Freq: Three times a day (TID) | ORAL | Status: DC
  Administered 2013-08-13 – 2013-08-15 (×5): 37.5 mg via ORAL
  Filled 2013-08-13 (×9): qty 1

## 2013-08-13 NOTE — Progress Notes (Signed)
PROGRESS NOTE  Michele MowersSharon Isadore Weber:[id number] DOB: 1970/05/08 DOA: 08/11/2013 PCP: Pcp Not In System  Michele MowersSharon Weber is a 43 y.o. female with Past medical history of diastolic dysfunction, lymphedema, morbid obesity, recent cellulitis.  Patient presented with complaints of increased heart rate. She mentions that since last 2 days she has been noticing that her heart has been racing fast and it has been ranging in the 140s to 150s per minute. She never had similar symptoms in the past. She denies any chest pain or palpitation shortness of breath or dizziness or lightheadedness for nausea or vomiting or dizziness or diarrhea or constipation or abdominal pain. She denies any recent change in her medications. She never had any similar episode in the past.  [facility] was [facility] for [facility] and due to deconditioning she was admitted to [facility] from there and from there was transferred to nursing home. In the interim she was started on Lasix. She uses CPAP and is compliant with that along with oxygen every night.  No prior cardiac workup no prior history of atrial fibrillation  Assessment/Plan: Atrial fibrillation with RVR  Lopressor- off cardizem gtt telemetry.  Echocardiogram ok  TSH ok  Dr. Arrie EasternHarwani  xaralto per cards  sleep apnea  Continue CPAP each bedtime.   Diastolic dysfunction  Resume lasix Weights here not accurate  Morbid obesity  Continue to monitor, patient has chronic Foley secondary to the same and also to help with healing decubitus ulcer   LE wound- wound care consult  PT consult  Was hospitalized in [facility] [place] and then [facility] then [facility]   Code Status: full Family Communication: patient Disposition Plan: SNF on monday   Consultants:  cards  Procedures:  echo    HPI/Subjective: Doing well, no SOB, no CP  Objective: Filed Vitals:   08/13/13 0454  BP: 99/57  Pulse: 80  Temp: 98 F (36.7 C)  Resp: 18     Intake/Output Summary (Last 24 hours) at 08/13/13 0854 Last data filed at 08/13/13 0600  Gross per 24 hour  Intake    960 ml  Output   3601 ml  Net  -2641 ml   Filed Weights   08/11/13 0239 08/13/13 0454  Weight: 171.006 kg (377 lb) 179.1 kg (394 lb 13.5 oz)    Exam:   General:  A+Ox3, NAd  Cardiovascular: irr  Respiratory: clear  Abdomen: +Bs, soft  Musculoskeletal: large LE, weeping wound on posterior thigh   Data Reviewed: Basic Metabolic Panel:  Recent Labs Lab 08/11/13 0027 08/11/13 0402 08/12/13 0040  NA 136* 139 138  K 4.1 4.5 4.2  CL 98 101 100  CO2 27 30 27   GLUCOSE 110* 110* 104*  BUN 18 17 15   CREATININE 0.92 0.89 0.78  CALCIUM 8.8 8.6 8.6  MG  --  1.8  --    Liver Function Tests:  Recent Labs Lab 08/11/13 0402  AST 8  ALT <5  ALKPHOS 46  BILITOT 0.3  PROT 7.7  ALBUMIN 2.4*   No results found for this basename: LIPASE, AMYLASE,  in the last 168 hours No results found for this basename: AMMONIA,  in the last 168 hours CBC:  Recent Labs Lab 08/11/13 0027 08/11/13 0402 08/12/13 0040 08/13/13 0327  WBC 6.7 6.5 6.1 6.2  NEUTROABS 2.7  --   --   --   HGB 11.8* 10.8* 10.3* 9.8*  HCT 37.3 34.5* 33.1* 32.6*  MCV 92.6 91.0 90.9 91.6  PLT 303 298 295 274   Cardiac Enzymes:  Recent Labs Lab 08/11/13 0027 08/11/13 0402 08/11/13 1052 08/11/13 1625  TROPONINI <0.30 <0.30 <0.30 <0.30   BNP (last 3 results) No results found for this basename: PROBNP,  in the last 8760 hours CBG: No results found for this basename: GLUCAP,  in the last 168 hours  Recent Results (from the past 240 hour(s))  MRSA PCR SCREENING     Status: Abnormal   Collection Time    08/11/13  2:42 AM      Result Value Ref Range Status   MRSA by PCR POSITIVE (*) NEGATIVE Final   Comment:            The GeneXpert MRSA Assay (FDA     approved for NASAL specimens     only), is one component of a     comprehensive MRSA colonization     surveillance program.  It is not     intended to diagnose MRSA     infection nor to guide or     monitor treatment for     MRSA infections.     RESULT CALLED TO, READ BACK BY AND VERIFIED WITH:     K SANDERS,RN 08/11/13 0518 RHOLMES     Studies: No results found.  Scheduled Meds: . diltiazem  180 mg Oral Daily  . furosemide  40 mg Oral BID  . metoprolol tartrate  37.5 mg Oral TID  . potassium chloride SA  40 mEq Oral BID  . rivaroxaban  20 mg Oral Q supper  . sodium chloride  3 mL Intravenous Q12H   Continuous Infusions:   Antibiotics Given (last 72 hours)   None      Principal Problem:   Atrial fibrillation with RVR Active Problems:   Morbid obesity   Lymphedema of both lower extremities   Diastolic CHF   Unspecified protein-calorie malnutrition   Physical deconditioning   Open wound of knee, leg (except thigh), and ankle, complicated    Time spent: 25    Michele CanaryVANN, Michele  [facility] Pager [phone]. If 7PM-7AM, please contact night-coverage at [email or web address], password [email or web address] 08/13/2013, 8:54 AM  LOS: 2 days

## 2013-08-13 NOTE — Progress Notes (Addendum)
Pt has CPAP in room with a small FFM. Settings at 14 cmH2O with 2 LPM [place] bled in. Pt states she can place mask on and off herself because it is like the machine she has. RT made pt aware that if she needed any help to please call. RT will continue to monitor.

## 2013-08-13 NOTE — Progress Notes (Signed)
Subjective:  Patient denies any chest pain or shortness of breath. Awaiting skilled nursing facility.  Objective:  Vital Signs in the last 24 hours: Temp:  [98 F (36.7 C)] 98 F (36.7 C) (06/27 0454) Pulse Rate:  [72-88] 86 (06/27 1053) Resp:  [18] 18 (06/27 0454) BP: (99-104)/(57-67) 100/58 mmHg (06/27 1053) SpO2:  [99 %-100 %] 99 % (06/27 0454) Weight:  [179.1 kg (394 lb 13.5 oz)] 179.1 kg (394 lb 13.5 oz) (06/27 0454)  Intake/Output from previous day: 06/26 0701 - 06/27 0700 In: 1200 [P.O.:1200] Out: 3601 [Urine:3600; Stool:1] Intake/Output from this shift: Total I/O In: 120 [P.O.:120] Out: -   Physical Exam: Neck: no adenopathy, no carotid bruit, no JVD and supple, symmetrical, trachea midline Lungs: clear to auscultation bilaterally Heart: irregularly irregular rhythm, S1, S2 normal and soft systolic murmur noted Abdomen: soft nontender distended Extremities: bilateral lymphedema  Lab Results:  Recent Labs  08/12/13 0040 08/13/13 0327  WBC 6.1 6.2  HGB 10.3* 9.8*  PLT 295 274    Recent Labs  08/11/13 0402 08/12/13 0040  NA 139 138  K 4.5 4.2  CL 101 100  CO2 30 27  GLUCOSE 110* 104*  BUN 17 15  CREATININE 0.89 0.78    Recent Labs  08/11/13 1052 08/11/13 1625  TROPONINI <0.30 <0.30   Hepatic Function Panel  Recent Labs  08/11/13 0402  PROT 7.7  ALBUMIN 2.4*  AST 8  ALT <5  ALKPHOS 46  BILITOT 0.3   No results found for this basename: CHOL,  in the last 72 hours No results found for this basename: PROTIME,  in the last 72 hours  Imaging: Imaging results have been reviewed and No results found.  Cardiac Studies:  Assessment/Plan:  New-onset A. Fib with controlled ventricular response Obstructive sleep apnea on CPAP  Morbid obesity  Chronic bilateral lymphedema  Compensated diastolic congestive heart failure  Vitamin D deficiency  Anemia  History of recent cellulitis  Decubitus ulcers Plan Continue present management I will  sign off please call if needed  LOS: 2 days    Velora Horstman N 08/13/2013, 11:22 AM

## 2013-08-13 NOTE — Evaluation (Signed)
Physical Therapy Evaluation Patient Details Name: Michele MowersSharon Weber MRN: [id number] DOB: [birth year] Today's Date: 08/13/2013   History of Present Illness  Pt is a 43 y/o female admitted with complaints of increased heart rate. Per chart review 2 days PTA pt had increased HR ranging from 140's to 150's per minute.   Clinical Impression  Pt admitted with the above. Pt currently with functional limitations due to the deficits listed below (see PT Problem List). At the time of PT eval pt was able to perform bed mobility and static sitting EOB with +2 assist. Pt will benefit from skilled PT to increase their independence and safety with mobility to allow discharge to the venue listed below.       Follow Up Recommendations SNF;Supervision/Assistance - 24 hour    Equipment Recommendations  None recommended by PT    Recommendations for Other Services       Precautions / Restrictions Precautions Precautions: Fall Precaution Comments: Pt is 394# Restrictions Weight Bearing Restrictions: No      Mobility  Bed Mobility Overal bed mobility: Needs Assistance Bed Mobility: Rolling;Supine to Sit Rolling: +2 for physical assistance;Max assist   Supine to sit: +2 for physical assistance;Mod assist     General bed mobility comments: Assist to elevate LE while pt rolls for peri-care. Pt can roll easier to the left than she can to the right. Sat up on R side of bed per pt request, and required assist to bring L side around to EOB. Pt was not able to get L hip all the way around, but was able to sit statically for ~8 minutes.   Transfers                    Ambulation/Gait                Stairs            Wheelchair Mobility    Modified Rankin (Stroke Patients Only)       Balance Overall balance assessment: Needs assistance Sitting-balance support: Feet supported;No upper extremity supported Sitting balance-Leahy Scale: Fair Sitting balance - Comments: Pt able to  sit EOB while NT washed back and we changed some of the pads under her.                                     Pertinent Vitals/Pain Vitals stable throughout session.     Home Living Family/patient expects to be discharged to:: Skilled nursing facility                      Prior Function Level of Independence: Needs assistance   Gait / Transfers Assistance Needed: Pt states she was working with PT  at SNF to improve ambulation. Able to walk ~20 feet at a time PTA.   ADL's / Homemaking Assistance Needed: Staff assisted her with all ADL's. She states she could wash her front but required assist for her back and all pericare.         Hand Dominance   Dominant Hand: Right    Extremity/Trunk Assessment   Upper Extremity Assessment: Overall WFL for tasks assessed           Lower Extremity Assessment: Generalized weakness;LLE deficits/detail (Minimal AROM supine in bed. )   LLE Deficits / Details: Unable to bend knee against gravity. Attempted to do PROM however pt was not assisting me  and was resisting at times (unintentionally I believe).   Cervical / Trunk Assessment: Normal  Communication   Communication: No difficulties  Cognition Arousal/Alertness: Awake/alert Behavior During Therapy: WFL for tasks assessed/performed Overall Cognitive Status: Within Functional Limits for tasks assessed                      General Comments      Exercises General Exercises - Lower Extremity Long Arc Quad: 10 reps      Assessment/Plan    PT Assessment Patient needs continued PT services  PT Diagnosis Difficulty walking;Generalized weakness   PT Problem List Decreased strength;Decreased range of motion;Decreased activity tolerance;Decreased balance;Decreased mobility;Decreased knowledge of use of DME;Decreased safety awareness;Decreased knowledge of precautions  PT Treatment Interventions DME instruction;Gait training;Stair training;Functional  mobility training;Therapeutic activities;Therapeutic exercise;Neuromuscular re-education;Patient/family education   PT Goals (Current goals can be found in the Care Plan section) Acute Rehab PT Goals Patient Stated Goal: To d/c to SNF to continue rehab PT Goal Formulation: With patient Time For Goal Achievement: 08/27/13 Potential to Achieve Goals: Fair    Frequency Min 2X/week   Barriers to discharge        Co-evaluation               End of Session   Activity Tolerance: Patient limited by fatigue Patient left: in bed;with call bell/phone within reach Nurse Communication: Mobility status         Time: [id number] PT Time Calculation (min): 38 min   Charges:   PT Evaluation $Initial PT Evaluation Tier I: 1 Procedure PT Treatments $Therapeutic Activity: 23-37 mins   PT G CodesRuthann Cancer:          Hamilton, Laura 08/13/2013, 12:36 PM  Ruthann CancerLaura Hamilton, PT, DPT Acute Rehabilitation Services Pager: [phone]

## 2013-08-14 LAB — CBC
HEMATOCRIT: 31.7 % — AB (ref 36.0–46.0)
HEMOGLOBIN: 9.9 g/dL — AB (ref 12.0–15.0)
MCH: 28.4 pg (ref 26.0–34.0)
MCHC: 31.2 g/dL (ref 30.0–36.0)
MCV: 90.8 fL (ref 78.0–100.0)
PLATELETS: 295 10*3/uL (ref 150–400)
RBC: 3.49 MIL/uL — ABNORMAL LOW (ref 3.87–5.11)
RDW: 16.3 % — ABNORMAL HIGH (ref 11.5–15.5)
WBC: 6.4 10*3/uL (ref 4.0–10.5)

## 2013-08-14 NOTE — Progress Notes (Signed)
Chart reviewed.   PROGRESS NOTE  Michele MowersSharon Weber ZHY:[id number] DOB: 20-May-1970 DOA: 08/11/2013 PCP: Pcp Not In System  Michele MowersSharon Weber is a 43 y.o. female with Past medical history of diastolic dysfunction, lymphedema, morbid obesity, recent cellulitis.  Patient presented with complaints of increased heart rate. She mentions that since last 2 days she has been noticing that her heart has been racing fast and it has been ranging in the 140s to 150s per minute. She never had similar symptoms in the past. She denies any chest pain or palpitation shortness of breath or dizziness or lightheadedness for nausea or vomiting or dizziness or diarrhea or constipation or abdominal pain. She denies any recent change in her medications. She never had any similar episode in the past.  [facility] and due to deconditioning she was admitted to [facility] from there and from there was transferred to nursing home. In the interim she was started on Lasix. She uses CPAP and is compliant with that along with oxygen every night.  No prior cardiac workup no prior history of atrial fibrillation  Assessment/Plan: Atrial fibrillation with RVR  Now rate controlled. Continue current  sleep apnea  Continue CPAP each bedtime.   Diastolic dysfunction  Continue current  Morbid obesity  patient has chronic Foley secondary to the same and also to help with healing decubitus ulcer   LE wound- wound care consulted  Was hospitalized in [place] and then [facility] then [facility]  Awaiting SNF. Stable  Code Status: full Family Communication: patient Disposition Plan: SNF on monday   Consultants:  cards  Procedures:  echo    HPI/Subjective: Doing well, no SOB, no CP  Objective: Filed Vitals:   08/14/13 0632  BP: 123/81  Pulse: 86  Temp: 98.5 F (36.9 C)  Resp: 18    Intake/Output Summary (Last 24 hours) at 08/14/13 1247 Last data  filed at 08/14/13 1100  Gross per 24 hour  Intake    240 ml  Output   4401 ml  Net  -4161 ml   Filed Weights   08/11/13 0239 08/13/13 0454 08/14/13 [id number]  Weight: 171.006 kg (377 lb) 179.1 kg (394 lb 13.5 oz) 201 kg (443 lb 2 oz)    Exam:   General:  A+Ox3, NAd  Cardiovascular: irreg irreg. Quiet. No MGR  Respiratory: clear without WRR  Abdomen: +Bs, soft, obese  Musculoskeletal: large LE, weeping wound on posterior thigh. No erythema  Data Reviewed: Basic Metabolic Panel:  Recent Labs Lab 08/11/13 0027 08/11/13 0402 08/12/13 0040  NA 136* 139 138  K 4.1 4.5 4.2  CL 98 101 100  CO2 27 30 27   GLUCOSE 110* 110* 104*  BUN 18 17 15   CREATININE 0.92 0.89 0.78  CALCIUM 8.8 8.6 8.6  MG  --  1.8  --    Liver Function Tests:  Recent Labs Lab 08/11/13 0402  AST 8  ALT <5  ALKPHOS 46  BILITOT 0.3  PROT 7.7  ALBUMIN 2.4*   No results found for this basename: LIPASE, AMYLASE,  in the last 168 hours No results found for this basename: AMMONIA,  in the last 168 hours CBC:  Recent Labs Lab 08/11/13 0027 08/11/13 0402 08/12/13 0040 08/13/13 0327 08/14/13 0422  WBC 6.7 6.5 6.1 6.2 6.4  NEUTROABS 2.7  --   --   --   --   HGB 11.8* 10.8* 10.3* 9.8* 9.9*  HCT 37.3 34.5* 33.1* 32.6*  31.7*  MCV 92.6 91.0 90.9 91.6 90.8  PLT 303 298 295 274 295   Cardiac Enzymes:  Recent Labs Lab 08/11/13 0027 08/11/13 0402 08/11/13 1052 08/11/13 1625  TROPONINI <0.30 <0.30 <0.30 <0.30   BNP (last 3 results) No results found for this basename: PROBNP,  in the last 8760 hours CBG: No results found for this basename: GLUCAP,  in the last 168 hours  Recent Results (from the past 240 hour(s))  MRSA PCR SCREENING     Status: Abnormal   Collection Time    08/11/13  2:42 AM      Result Value Ref Range Status   MRSA by PCR POSITIVE (*) NEGATIVE Final   Comment:            The GeneXpert MRSA Assay (FDA     approved for NASAL specimens     only), is one component of a      comprehensive MRSA colonization     surveillance program. It is not     intended to diagnose MRSA     infection nor to guide or     monitor treatment for     MRSA infections.     RESULT CALLED TO, READ BACK BY AND VERIFIED WITH:     K SANDERS,RN 08/11/13 0518 RHOLMES     Studies: No results found.  Scheduled Meds: . diltiazem  180 mg Oral Daily  . furosemide  40 mg Oral BID  . metoprolol tartrate  37.5 mg Oral TID  . potassium chloride SA  40 mEq Oral BID  . rivaroxaban  20 mg Oral Q supper  . sodium chloride  3 mL Intravenous Q12H   Continuous Infusions:   Antibiotics Given (last 72 hours)   None     Time spent: 25  Christiane HaSULLIVAN,Adetokunbo Mccadden L, MD [facility] Pager [phone]. If 7PM-7AM, please contact night-coverage at [email or web address], password [email or web address] 08/14/2013, 12:47 PM  LOS: 3 days

## 2013-08-14 NOTE — Progress Notes (Signed)
Water ck in cpap unit. Pt stated she placed herself on. Will monitor

## 2013-08-15 ENCOUNTER — Non-Acute Institutional Stay (SKILLED_NURSING_FACILITY): Payer: Medicare Other | Admitting: Internal Medicine

## 2013-08-15 DIAGNOSIS — I5031 Acute diastolic (congestive) heart failure: Secondary | ICD-10-CM

## 2013-08-15 DIAGNOSIS — I4891 Unspecified atrial fibrillation: Secondary | ICD-10-CM

## 2013-08-15 DIAGNOSIS — I509 Heart failure, unspecified: Secondary | ICD-10-CM

## 2013-08-15 DIAGNOSIS — I480 Paroxysmal atrial fibrillation: Secondary | ICD-10-CM

## 2013-08-15 LAB — CBC
HCT: 33.8 % — ABNORMAL LOW (ref 36.0–46.0)
HEMOGLOBIN: 10.7 g/dL — AB (ref 12.0–15.0)
MCH: 29.2 pg (ref 26.0–34.0)
MCHC: 31.7 g/dL (ref 30.0–36.0)
MCV: 92.1 fL (ref 78.0–100.0)
Platelets: 271 10*3/uL (ref 150–400)
RBC: 3.67 MIL/uL — ABNORMAL LOW (ref 3.87–5.11)
RDW: 16.3 % — ABNORMAL HIGH (ref 11.5–15.5)
WBC: 6 10*3/uL (ref 4.0–10.5)

## 2013-08-15 MED ORDER — MUPIROCIN 2 % EX OINT
1.0000 "application " | TOPICAL_OINTMENT | Freq: Two times a day (BID) | CUTANEOUS | Status: DC
  Filled 2013-08-15: qty 22

## 2013-08-15 MED ORDER — RIVAROXABAN 20 MG PO TABS: 20.0000 mg | ORAL_TABLET | Freq: Every day | ORAL | Status: DC

## 2013-08-15 MED ORDER — METOPROLOL TARTRATE 12.5 MG HALF TABLET: 37.5000 mg | ORAL_TABLET | Freq: Three times a day (TID) | ORAL | Status: DC

## 2013-08-15 MED ORDER — DILTIAZEM HCL ER COATED BEADS 180 MG PO CP24: 180.0000 mg | ORAL_CAPSULE | Freq: Every day | ORAL | Status: DC

## 2013-08-15 MED ORDER — CHLORHEXIDINE GLUCONATE CLOTH 2 % EX PADS: 6.0000 | MEDICATED_PAD | Freq: Every day | CUTANEOUS | Status: DC

## 2013-08-15 NOTE — Progress Notes (Addendum)
Clinical Social Worker facilitated patient discharge including contacting patient and facility to confirm patient discharge plans.  Patient states she will call family. Clinical information faxed to facility and patient agreeable with plan.  CSW [facility]. CSW informed [facility] of patient's information for EMS ride.  RN to call report prior to discharge.  Clinical Social Worker will sign off for now as social work intervention is no longer needed. Please consult us again if new need arises.  Maree KrabbeLindsay Puritz, MSW, Theresia MajorsLCSWA [phone]

## 2013-08-15 NOTE — Discharge Summary (Signed)
Physician Discharge Summary  Marcie MowersSharon Zuidema ZOX:[id number] DOB: 10/15/1970 DOA: 08/11/2013  PCP: Pcp Not In System  Admit date: 08/11/2013 Discharge date: 08/15/2013  Time spent: greater than 30 minutes  Discharge Diagnoses:  Principal Problem:   Atrial fibrillation with RVR Active Problems:   Morbid obesity   Lymphedema of both lower extremities   Diastolic CHF   Unspecified protein-calorie malnutrition   Physical deconditioning   Open wound of left posterior thigh, present on admission OSA on CPAP  Discharge Condition: stable  Filed Weights   08/13/13 0454 08/14/13 [id number] 08/15/13 0421  Weight: 179.1 kg (394 lb 13.5 oz) 201 kg (443 lb 2 oz) 201 kg (443 lb 2 oz)    History of present illness:  43 y.o. female with Past medical history of diastolic dysfunction, lymphedema, morbid obesity, recent cellulitis.  Patient presented with complaints of increased heart rate. She mentions that since last 2 days she has been noticing that her heart has been racing fast and it has been ranging in the 140s to 150s per minute. She never had similar symptoms in the past. She denies any chest pain or palpitation shortness of breath or dizziness or lightheadedness for nausea or vomiting or dizziness or diarrhea or constipation or abdominal pain. She denies any recent change in her medications. She never had any similar episode in the past.  [facility] was [facility] for cellulitis and due to deconditioning she was admitted to [facility] from there and from there was transferred to nursing home. In the interim she was started on Lasix. She uses CPAP and is compliant with that along with oxygen every night.  No prior cardiac workup no prior history of atrial fibrillation.   Hospital Course:  Atrial fibrillation with RVR: initially started on cardizem gtt with good results.  Cardiology consulted and recommended xarelto.  Transitioned to oral cardizem and metoprolol.  Heart  rate in the 80s at discharge.  Echocardiogram ok. TSH ok  sleep apnea:  Continue CPAP each bedtime.   Diastolic dysfunction:  lasix and potassium continued.    Morbid obesity:  has chronic Foley secondary to the same and also to help with healing decubitus ulcer   Left posterior LE wound- wound care consulted and recommended continuing silver hydrofiber dressings, as she was getting at SNF  Was hospitalized in [facility] and then [facility] then [facility]. Will go back to SNF  Procedures:  none  Consultations:  Cardiology: Harwani  Discharge Exam: Filed Vitals:   08/15/13 0421  BP: 97/50  Pulse: 82  Temp: 97.9 F (36.6 C)  Resp: 20    General: alert, oriented, comfortable Cardiovascular: irreg irreg Respiratory: cTA Ext: lymphedema bilaterally  Discharge Instructions   Diet - low sodium heart healthy    Complete by:  As directed      Walk with assistance    Complete by:  As directed             Medication List         diltiazem 180 MG 24 hr capsule  Commonly known as:  CARDIZEM CD  Take 1 capsule (180 mg total) by mouth daily.     furosemide 40 MG tablet  Commonly known as:  LASIX  Take 40 mg by mouth 2 (two) times daily.     metoprolol tartrate 12.5 mg Tabs tablet  Commonly known as:  LOPRESSOR  Take 1.5 tablets (37.5 mg total) by mouth 3 (three) times daily.  potassium chloride SA 20 MEQ tablet  Commonly known as:  K-DUR,KLOR-CON  Take 40 mEq by mouth 2 (two) times daily.     rivaroxaban 20 MG Tabs tablet  Commonly known as:  XARELTO  Take 1 tablet (20 mg total) by mouth daily with supper.     TYLENOL 8 HOUR 650 MG CR tablet  Generic drug:  acetaminophen  Take 650 mg by mouth every 8 (eight) hours as needed for pain.     VITAMIN C PO  Take 1 tablet by mouth 2 (two) times daily.       No Known Allergies    The results of significant diagnostics from this hospitalization (including imaging, microbiology, ancillary and laboratory) are  listed below for reference.    Significant Diagnostic Studies: Dg Chest Port 1 View  08/11/2013   CLINICAL DATA:  Shortness of breath  EXAM: PORTABLE CHEST - 1 VIEW  COMPARISON:  None.  FINDINGS: There is mild cardiomegaly present with possible mild pulmonary vascular congestion. No infiltrate or effusion is seen. No bony abnormality is noted  IMPRESSION: Mild cardiomegaly with question of mild pulmonary vascular congestion.   Electronically Signed   By: Dwyane Dee M.D.   On: 08/11/2013 08:18   EKG Atrial fib with rate 150  Echo Left ventricle: The cavity size was normal. There was moderate focal basal hypertrophy. Systolic function was normal. The estimated ejection fraction was in the range of 55% to 60%. Wall motion was normal; there were no regional wall motion abnormalities. - Mitral valve: There was trivial regurgitation. - Left atrium: The atrium was mildly dilated. - Tricuspid valve: There was trivial regurgitation.  Microbiology: Recent Results (from the past 240 hour(s))  MRSA PCR SCREENING     Status: Abnormal   Collection Time    08/11/13  2:42 AM      Result Value Ref Range Status   MRSA by PCR POSITIVE (*) NEGATIVE Final   Comment:            The GeneXpert MRSA Assay (FDA     approved for NASAL specimens     only), is one component of a     comprehensive MRSA colonization     surveillance program. It is not     intended to diagnose MRSA     infection nor to guide or     monitor treatment for     MRSA infections.     RESULT CALLED TO, READ BACK BY AND VERIFIED WITH:     K [facility] 08/11/13 0518 RHOLMES     Labs: Basic Metabolic Panel:  Recent Labs Lab 08/11/13 0027 08/11/13 0402 08/12/13 0040  NA 136* 139 138  K 4.1 4.5 4.2  CL 98 101 100  CO2 27 30 27   GLUCOSE 110* 110* 104*  BUN 18 17 15   CREATININE 0.92 0.89 0.78  CALCIUM 8.8 8.6 8.6  MG  --  1.8  --    Liver Function Tests:  Recent Labs Lab 08/11/13 0402  AST 8  ALT <5  ALKPHOS 46   BILITOT 0.3  PROT 7.7  ALBUMIN 2.4*   No results found for this basename: LIPASE, AMYLASE,  in the last 168 hours No results found for this basename: AMMONIA,  in the last 168 hours CBC:  Recent Labs Lab 08/11/13 0027 08/11/13 0402 08/12/13 0040 08/13/13 0327 08/14/13 0422 08/15/13 0355  WBC 6.7 6.5 6.1 6.2 6.4 6.0  NEUTROABS 2.7  --   --   --   --   --  HGB 11.8* 10.8* 10.3* 9.8* 9.9* 10.7*  HCT 37.3 34.5* 33.1* 32.6* 31.7* 33.8*  MCV 92.6 91.0 90.9 91.6 90.8 92.1  PLT 303 298 295 274 295 271   Cardiac Enzymes:  Recent Labs Lab 08/11/13 0027 08/11/13 0402 08/11/13 1052 08/11/13 1625  TROPONINI <0.30 <0.30 <0.30 <0.30   BNP: BNP (last 3 results) No results found for this basename: PROBNP,  in the last 8760 hours CBG: No results found for this basename: GLUCAP,  in the last 168 hours  Signed:  Liana Camerer L  [facility] 08/15/2013, 9:21 AM

## 2013-08-22 NOTE — Progress Notes (Signed)
Patient ID: Michele MowersSharon Weber, female   DOB: 1970/07/07, 43 y.o.   MRN: [id number]                  HISTORY & PHYSICAL  DATE:  08/15/2013      FACILITY: [facility]    LEVEL OF CARE:   SNF   CHIEF COMPLAINT:  Readmission to the facility, [facility] stay at [facility], 08/11/2013 through 08/15/2013.    HISTORY OF PRESENT ILLNESS:  [name] is a patient who has been in this facility since March.  At that point, she presented with cellulitis of her legs in the setting of extreme lymphedema.  She had been, I think, progressing towards transition to a long-term resident of this facility.    She has a known history of diastolic heart failure, but did not have any rhythm problems.    According to the staff, she was discovered to be tachycardic in the facility.  An EKG showed atrial fibrillation with a heart rate of 140 to 150s.  She had not had a history of coronary artery disease.  No chest pain.    As stated, she was admitted [facility] to hospital in [facility] and then to [facility] before arriving here.    She has obstructive sleep apnea and uses CPAP.    The patient's atrial fibrillation was controlled with Cardizem.  Cardiology consulted and recommended Xarelto.  She was transitioned to oral Cardizem and metoprolol.  Her echocardiogram showed an EF of 55-60%.  Her left ventricular cavity size was normal.  There was moderate focal basal hypertrophy.  There was trivial tricuspid regurgitation.  Right ventricle cavity size was normal, as was the wall thickness.  Pulmonary arteries:  Systolic pressure was within the normal range.    PAST MEDICAL HISTORY/PROBLEM LIST:    Atrial fibrillation with rapid ventricular response.    Morbid obesity.    Sleep apnea.    Severe lymphedema of both lower extremities.    Grade 1 diastolic dysfunction.    Unspecified protein calorie malnutrition.    Physical deconditioning.    Open wound on the posterior left thigh.    CURRENT MEDICATIONS:   Discharge medications include:     Diltiazem CD 180 q.d.     Lasix 40 b.i.d.    Metoprolol 1.5 tablets, 37.5 by mouth three times daily.    Potassium 40 mEq two times daily.    Xarelto 20 mg daily.    Tylenol 650 q.8 p.r.n.    Vitamin C 1 tablet b.i.d.    SOCIAL HISTORY:  I do not have much history on this patient at this point.   HOUSING:   She has been in the building since March.  I think she was transitioning to a Medicaid-based, long-stay patient.    REVIEW OF SYSTEMS:   CHEST/RESPIRATORY:  No shortness of breath.  CARDIAC:   No chest pain.    GI:  No abdominal pain.    MUSCULOSKELETAL:  She does complain of left knee pain when she tries to stand.    PHYSICAL EXAMINATION:   VITAL SIGNS:   O2 SATURATIONS:  Pulse ox 97% on room air.   PULSE:  87, and I still think atrial fibrillation.   RESPIRATIONS:  16 and unlabored.   GENERAL APPEARANCE:  The patient is in no distress.   CHEST/RESPIRATORY:  Clear air entry bilaterally.   CARDIOVASCULAR:  CARDIAC:  Heart sounds are regular.  There are no murmurs.   GASTROINTESTINAL:  ABDOMEN:  Massively obese.  No tenderness.   CIRCULATION:   EDEMA/VARICOSITIES:  Extremities:  Absolutely massive lymphedema bilaterally.   SKIN:  INSPECTION:  She has a probing wound on the right anterior thigh which I have cultured.  This maybe goes down roughly 1-1/2 cm.  The pathogenesis of this and the previously similar wound she has had is not totally clear.  On the pannus on the medial aspect of her left thigh are the remnants of an open wound.  This actually looks better than the last time I had seen this.    MUSCULOSKELETAL:   EXTREMITIES:   LEFT LOWER EXTREMITY:  There does appear to be some tenderness in the left knee and perhaps a small effusion.  It is not easy to examine this in any detail.    ASSESSMENT/PLAN:  Atrial fibrillation with rapid ventricular response.  Her echocardiogram was normal.  She does not have pulmonary hypertension.   Her TSH was 3.98 on 08/11/2013.  This is within the normal range.  She is now on Xarelto.    Diastolic heart failure.  She is on Lasix 40 mg b.i.d., as well as potassium.  We will need to check her basic metabolic panel.  This was fairly normal on 08/12/2013 with a sodium of 138, a potassium of 4.2, a CO2 of 27, BUN 15, creatinine 0.78.    Normochromic, normocytic anemia.  Her hemoglobin in the hospital ranged between 9.8 and 10.7.  This is normochromic, normocytic.  However, the reason behind this is not clear.    Sleep apnea.   She is compliant with her CPAP.  She does not have evidence of right ventricular failure at this point.    Unspecified protein calorie malnutrition.  Her albumin was 2.4 in the hospital.  If this is all nutritional, this is fairly significant.  She should be screened for a protein-losing nephropathy and I will order a basic evaluation of this to exclude nephropathy associated with morbid obesity.    CPT CODE: 1610999306        ADDENDUM:    She has a Foley catheter in place.  I am not really certain of the history here.  This may have been put in to protect her from wounds at some point.  It may be reasonable from several aspects to continue this, although I would like to discuss this with the patient at a later date.

## 2013-09-05 ENCOUNTER — Non-Acute Institutional Stay (SKILLED_NURSING_FACILITY): Payer: Medicare Other | Admitting: Internal Medicine

## 2013-09-05 DIAGNOSIS — I890 Lymphedema, not elsewhere classified: Secondary | ICD-10-CM

## 2013-09-05 DIAGNOSIS — I480 Paroxysmal atrial fibrillation: Secondary | ICD-10-CM

## 2013-09-05 DIAGNOSIS — I5032 Chronic diastolic (congestive) heart failure: Secondary | ICD-10-CM

## 2013-09-05 DIAGNOSIS — E876 Hypokalemia: Secondary | ICD-10-CM

## 2013-09-05 DIAGNOSIS — I509 Heart failure, unspecified: Secondary | ICD-10-CM

## 2013-09-05 DIAGNOSIS — I4891 Unspecified atrial fibrillation: Secondary | ICD-10-CM

## 2013-09-05 NOTE — Progress Notes (Signed)
         PROGRESS NOTE  DATE: 09-05-13  FACILITY: Nursing Home Location: [facility]  LEVEL OF CARE: SNF (31)  Routine Visit  CHIEF COMPLAINT:  Manage lymphedema, CHF and hypokalemia  HISTORY OF PRESENT ILLNESS:  REASSESSMENT OF ONGOING PROBLEM(S):  LYMPHEDEMA: The treatment nurse reports that patient's left inner thigh has increased edema and warmth. Patient has massive lymphedema. Patient denies tenderness or redness in the left eye. Symptoms were noted this morning. She has Completed antibiotics for cellulitis last week. Patient denies fever chills or night sweats.  CHF:The patient does not relate significant weight changes, denies sob, DOE, orthopnea, PNDs, palpitations or chest pain.  CHF remains stable.  No complications form the medications being used. Patient has chronic lower extremity swelling.  HYPOKALEMIA: The patient's hypokalemia remains stable. Patient denies muscle cramping or palpitations. No complications reported from current potassium supplementation.  PAST MEDICAL HISTORY : Reviewed.  No changes/see problem list  CURRENT MEDICATIONS: Reviewed per MAR/see medication list  REVIEW OF SYSTEMS:  GENERAL: no change in appetite, no fatigue, no weight changes, no fever, chills or weakness RESPIRATORY: no cough, SOB, DOE, wheezing, hemoptysis CARDIAC: no chest pain, or palpitations, complains of chronic LE swelling GI: no abdominal pain, diarrhea, constipation, heart burn, nausea or vomiting  PHYSICAL EXAMINATION  VS:  See VS section  GENERAL: no acute distress, morbidly obese body habitus NECK: supple, trachea midline, no neck masses, no thyroid tenderness, no thyromegaly RESPIRATORY: breathing is even & unlabored, BS CTAB CARDIAC: RRR, no murmur,no extra heart sounds, 6 bilateral lower extremity edema GI: abdomen soft, normal BS, no masses, no tenderness, no hepatomegaly, no splenomegaly PSYCHIATRIC: the patient is alert & oriented to  person, affect & behavior appropriate  LABS/RADIOLOGY:  7-15 BMP normal 5-15 TSH 4.35, hemoglobin 10.9, MCV 92 otherwise CBC normal, BMP normal 4-15 albumin 3.3 otherwise CMP normal  ASSESSMENT/PLAN:  CHF-compensated Lymphedema-Massive. Continue supportive care. Hypokalemia-well repleted Atrial fibrillation-rate controlled Morbid obesity-continue supportive care Anemia-stable Right buttock abscess-on Zyvox and augmentin.  CPT CODE: 1610999308  Angela CoxGayani Y Dasanayaka, MD [facility] [phone]

## 2013-09-12 ENCOUNTER — Non-Acute Institutional Stay (SKILLED_NURSING_FACILITY): Payer: Medicare Other | Admitting: Internal Medicine

## 2013-09-12 DIAGNOSIS — R609 Edema, unspecified: Secondary | ICD-10-CM

## 2013-09-12 DIAGNOSIS — E876 Hypokalemia: Secondary | ICD-10-CM

## 2013-09-14 DIAGNOSIS — R609 Edema, unspecified: Secondary | ICD-10-CM | POA: Insufficient documentation

## 2013-09-14 NOTE — Progress Notes (Signed)
Patient ID: Michele Weber, female   DOB: 12/30/1970, 43 y.o.   MRN: [id number]           PROGRESS NOTE  DATE: 09/12/2013         FACILITY[facility]  [facility]  LEVEL OF CARE: SNF (31)  Acute Visit  CHIEF COMPLAINT:  Manage left lower extremity swelling.    HISTORY OF PRESENT ILLNESS: I was requested by the staff to assess the patient regarding above problem(s):  Patient has been having increased swelling in left thigh with increased drainage.  Patient denies shortness of breath or chest pain.  Symptoms have been going on for a couple of days.  There is no temporal relationship.    PAST MEDICAL HISTORY : Reviewed.  No changes/see problem list  CURRENT MEDICATIONS: Reviewed per MAR/see medication list  REVIEW OF SYSTEMS:  GENERAL: no change in appetite, no fatigue, no weight changes, no fever, chills or weakness RESPIRATORY: no cough, SOB, DOE,, wheezing, hemoptysis CARDIAC: no chest pain or palpitations;  chronic lower extremity swelling    GI: no abdominal pain, diarrhea, constipation, heart burn, nausea or vomiting  PHYSICAL EXAMINATION  VS: see VS section  GENERAL: no acute distress, morbidly obese body habitus NECK: supple, trachea midline, no neck masses, no thyroid tenderness, no thyromegaly RESPIRATORY: breathing is even & unlabored, BS CTAB CARDIAC: RRR, no murmur,no extra heart sounds, left lower extremity has +6 edema, right lower extremity has +4 edema           GI: abdomen soft, normal BS, no masses, no tenderness, no hepatomegaly, no splenomegaly PSYCHIATRIC: the patient is alert & oriented to person, affect & behavior appropriate  LABS/RADIOLOGY:  08/16/2013:  BMP normal.     ASSESSMENT/PLAN:  Left lower extremity edema.   Unstable problem.  Edema has been increasing.  Increase Lasix to 60 mg b.i.d. for five days, then decrease to 40 mg b.i.d.    Hypokalemia.  Increase KCl to 40 mEq t.i.d. for five days, then decrease to 40 mEq b.i.d.    CPT  CODE: [id number]         Angela CoxGayani Y Karriem Muench, MD [facility] [phone]

## 2013-09-29 ENCOUNTER — Non-Acute Institutional Stay (SKILLED_NURSING_FACILITY): Payer: Medicare Other | Admitting: Internal Medicine

## 2013-09-29 DIAGNOSIS — E039 Hypothyroidism, unspecified: Secondary | ICD-10-CM

## 2013-09-29 DIAGNOSIS — R609 Edema, unspecified: Secondary | ICD-10-CM

## 2013-09-29 DIAGNOSIS — E876 Hypokalemia: Secondary | ICD-10-CM

## 2013-09-30 ENCOUNTER — Encounter (HOSPITAL_COMMUNITY): Payer: Self-pay | Admitting: Emergency Medicine

## 2013-09-30 ENCOUNTER — Inpatient Hospital Stay (HOSPITAL_COMMUNITY)
Admission: EM | Admit: 2013-09-30 | Discharge: 2013-10-04 | DRG: 872 | Disposition: A | Discharge status: Other Acute Inpatient Hospital | Payer: Medicare Other | Attending: Internal Medicine | Admitting: Internal Medicine

## 2013-09-30 ENCOUNTER — Emergency Department (HOSPITAL_COMMUNITY): Payer: Medicare Other

## 2013-09-30 DIAGNOSIS — L02219 Cutaneous abscess of trunk, unspecified: Secondary | ICD-10-CM

## 2013-09-30 DIAGNOSIS — I4891 Unspecified atrial fibrillation: Secondary | ICD-10-CM | POA: Diagnosis present

## 2013-09-30 DIAGNOSIS — I959 Hypotension, unspecified: Secondary | ICD-10-CM | POA: Diagnosis not present

## 2013-09-30 DIAGNOSIS — E872 Acidosis, unspecified: Secondary | ICD-10-CM | POA: Diagnosis present

## 2013-09-30 DIAGNOSIS — Z7901 Long term (current) use of anticoagulants: Secondary | ICD-10-CM

## 2013-09-30 DIAGNOSIS — L0390 Cellulitis, unspecified: Secondary | ICD-10-CM | POA: Diagnosis not present

## 2013-09-30 DIAGNOSIS — G4733 Obstructive sleep apnea (adult) (pediatric): Secondary | ICD-10-CM | POA: Diagnosis present

## 2013-09-30 DIAGNOSIS — L03119 Cellulitis of unspecified part of limb: Secondary | ICD-10-CM

## 2013-09-30 DIAGNOSIS — I5032 Chronic diastolic (congestive) heart failure: Secondary | ICD-10-CM

## 2013-09-30 DIAGNOSIS — E559 Vitamin D deficiency, unspecified: Secondary | ICD-10-CM | POA: Diagnosis present

## 2013-09-30 DIAGNOSIS — R000 Tachycardia, unspecified: Secondary | ICD-10-CM | POA: Diagnosis present

## 2013-09-30 DIAGNOSIS — B957 Other staphylococcus as the cause of diseases classified elsewhere: Secondary | ICD-10-CM | POA: Diagnosis present

## 2013-09-30 DIAGNOSIS — I890 Lymphedema, not elsewhere classified: Secondary | ICD-10-CM | POA: Diagnosis present

## 2013-09-30 DIAGNOSIS — L02419 Cutaneous abscess of limb, unspecified: Secondary | ICD-10-CM | POA: Diagnosis present

## 2013-09-30 DIAGNOSIS — R7881 Bacteremia: Secondary | ICD-10-CM | POA: Diagnosis present

## 2013-09-30 DIAGNOSIS — I5042 Chronic combined systolic (congestive) and diastolic (congestive) heart failure: Secondary | ICD-10-CM | POA: Diagnosis present

## 2013-09-30 DIAGNOSIS — A419 Sepsis, unspecified organism: Principal | ICD-10-CM | POA: Insufficient documentation

## 2013-09-30 DIAGNOSIS — I509 Heart failure, unspecified: Secondary | ICD-10-CM | POA: Diagnosis present

## 2013-09-30 DIAGNOSIS — I950 Idiopathic hypotension: Secondary | ICD-10-CM

## 2013-09-30 DIAGNOSIS — R609 Edema, unspecified: Secondary | ICD-10-CM | POA: Diagnosis present

## 2013-09-30 DIAGNOSIS — I9589 Other hypotension: Secondary | ICD-10-CM

## 2013-09-30 DIAGNOSIS — L03116 Cellulitis of left lower limb: Secondary | ICD-10-CM

## 2013-09-30 DIAGNOSIS — L03319 Cellulitis of trunk, unspecified: Secondary | ICD-10-CM

## 2013-09-30 DIAGNOSIS — I5030 Unspecified diastolic (congestive) heart failure: Secondary | ICD-10-CM | POA: Diagnosis present

## 2013-09-30 DIAGNOSIS — Z833 Family history of diabetes mellitus: Secondary | ICD-10-CM | POA: Diagnosis not present

## 2013-09-30 DIAGNOSIS — Z6841 Body Mass Index (BMI) 40.0 and over, adult: Secondary | ICD-10-CM

## 2013-09-30 DIAGNOSIS — Z7401 Bed confinement status: Secondary | ICD-10-CM | POA: Diagnosis not present

## 2013-09-30 DIAGNOSIS — L0291 Cutaneous abscess, unspecified: Secondary | ICD-10-CM | POA: Diagnosis not present

## 2013-09-30 LAB — CBC WITH DIFFERENTIAL/PLATELET
Basophils Absolute: 0 10*3/uL (ref 0.0–0.1)
Basophils Relative: 0 % (ref 0–1)
EOS ABS: 0 10*3/uL (ref 0.0–0.7)
Eosinophils Relative: 0 % (ref 0–5)
HEMATOCRIT: 35.5 % — AB (ref 36.0–46.0)
Hemoglobin: 11.4 g/dL — ABNORMAL LOW (ref 12.0–15.0)
LYMPHS PCT: 4 % — AB (ref 12–46)
Lymphs Abs: 0.3 10*3/uL — ABNORMAL LOW (ref 0.7–4.0)
MCH: 28 pg (ref 26.0–34.0)
MCHC: 32.1 g/dL (ref 30.0–36.0)
MCV: 87.2 fL (ref 78.0–100.0)
MONO ABS: 0.3 10*3/uL (ref 0.1–1.0)
Monocytes Relative: 3 % (ref 3–12)
Neutro Abs: 8.3 10*3/uL — ABNORMAL HIGH (ref 1.7–7.7)
Neutrophils Relative %: 93 % — ABNORMAL HIGH (ref 43–77)
Platelets: 196 10*3/uL (ref 150–400)
RBC: 4.07 MIL/uL (ref 3.87–5.11)
RDW: 16.9 % — AB (ref 11.5–15.5)
WBC: 8.9 10*3/uL (ref 4.0–10.5)

## 2013-09-30 LAB — URINE MICROSCOPIC-ADD ON

## 2013-09-30 LAB — COMPREHENSIVE METABOLIC PANEL
ALT: <5 U/L (ref 0–35)
AST: 8 U/L (ref 0–37)
Albumin: 2.4 g/dL — ABNORMAL LOW (ref 3.5–5.2)
Alkaline Phosphatase: 41 U/L (ref 39–117)
Anion gap: 15 (ref 5–15)
BUN: 20 mg/dL (ref 6–23)
CO2: 24 mEq/L (ref 19–32)
Calcium: 8.2 mg/dL — ABNORMAL LOW (ref 8.4–10.5)
Chloride: 99 mEq/L (ref 96–112)
Creatinine, Ser: 0.99 mg/dL (ref 0.50–1.10)
GFR calc Af Amer: 79 mL/min — ABNORMAL LOW (ref >90)
GFR calc non Af Amer: 68 mL/min — ABNORMAL LOW (ref >90)
Glucose, Bld: 95 mg/dL (ref 70–99)
Potassium: 4.4 mEq/L (ref 3.7–5.3)
Sodium: 138 mEq/L (ref 137–147)
Total Bilirubin: 1 mg/dL (ref 0.3–1.2)
Total Protein: 7.1 g/dL (ref 6.0–8.3)

## 2013-09-30 LAB — URINALYSIS, ROUTINE W REFLEX MICROSCOPIC
Bilirubin Urine: NEGATIVE
Glucose, UA: NEGATIVE mg/dL
Ketones, ur: NEGATIVE mg/dL
Nitrite: NEGATIVE
Protein, ur: NEGATIVE mg/dL
Specific Gravity, Urine: 1.014 (ref 1.005–1.030)
Urobilinogen, UA: 0.2 mg/dL (ref 0.0–1.0)
pH: 8 (ref 5.0–8.0)

## 2013-09-30 LAB — MRSA PCR SCREENING: MRSA by PCR: POSITIVE — AB

## 2013-09-30 LAB — PROCALCITONIN
Procalcitonin: 12.82 ng/mL
Procalcitonin: 15.89 ng/mL

## 2013-09-30 LAB — I-STAT CG4 LACTIC ACID, ED: Lactic Acid, Venous: 3.24 mmol/L — ABNORMAL HIGH (ref 0.5–2.2)

## 2013-09-30 LAB — TROPONIN I: Troponin I: <0.3 ng/mL (ref <0.30)

## 2013-09-30 MED ORDER — ACETAMINOPHEN 650 MG RE SUPP: 650.0000 mg | Freq: Four times a day (QID) | RECTAL | Status: DC | PRN

## 2013-09-30 MED ORDER — AMIODARONE HCL IN DEXTROSE 360-4.14 MG/200ML-% IV SOLN
30.0000 mg/h | INTRAVENOUS | Status: DC
  Administered 2013-10-01 – 2013-10-03 (×5): 30 mg/h via INTRAVENOUS
  Filled 2013-09-30 (×11): qty 200

## 2013-09-30 MED ORDER — PIPERACILLIN-TAZOBACTAM 3.375 G IVPB
3.3750 g | Freq: Three times a day (TID) | INTRAVENOUS | Status: DC
  Administered 2013-09-30 – 2013-10-03 (×9): 3.375 g via INTRAVENOUS
  Filled 2013-09-30 (×11): qty 50

## 2013-09-30 MED ORDER — ACETAMINOPHEN 325 MG PO TABS
650.0000 mg | ORAL_TABLET | Freq: Once | ORAL | Status: AC
  Administered 2013-09-30: 650 mg via ORAL
  Filled 2013-09-30: qty 2

## 2013-09-30 MED ORDER — SODIUM CHLORIDE 0.9 % IV SOLN: 1000.0000 mL | INTRAVENOUS | Status: DC

## 2013-09-30 MED ORDER — SODIUM CHLORIDE 0.9 % IV BOLUS (SEPSIS)
30.0000 mL/kg | Freq: Once | INTRAVENOUS | Status: AC
  Administered 2013-09-30: 1000 mL via INTRAVENOUS

## 2013-09-30 MED ORDER — VANCOMYCIN HCL 10 G IV SOLR
2000.0000 mg | INTRAVENOUS | Status: DC
  Filled 2013-09-30: qty 2000

## 2013-09-30 MED ORDER — AMIODARONE HCL IN DEXTROSE 360-4.14 MG/200ML-% IV SOLN
60.0000 mg/h | INTRAVENOUS | Status: AC
Start: 2013-09-30 — End: 2013-10-01
  Administered 2013-09-30 – 2013-10-01 (×2): 60 mg/h via INTRAVENOUS
  Filled 2013-09-30 (×2): qty 200

## 2013-09-30 MED ORDER — HYDROCODONE-ACETAMINOPHEN 5-325 MG PO TABS: 1.0000 | ORAL_TABLET | ORAL | Status: DC | PRN

## 2013-09-30 MED ORDER — VANCOMYCIN HCL 10 G IV SOLR
2500.0000 mg | INTRAVENOUS | Status: AC
  Administered 2013-09-30: 2500 mg via INTRAVENOUS
  Filled 2013-09-30: qty 2500

## 2013-09-30 MED ORDER — RIVAROXABAN 20 MG PO TABS
20.0000 mg | ORAL_TABLET | Freq: Every day | ORAL | Status: DC
  Administered 2013-09-30 – 2013-10-04 (×5): 20 mg via ORAL
  Filled 2013-09-30 (×6): qty 1

## 2013-09-30 MED ORDER — DIGOXIN 0.25 MG/ML IJ SOLN
0.2500 mg | Freq: Once | INTRAMUSCULAR | Status: AC
  Administered 2013-09-30: 0.25 mg via INTRAVENOUS
  Filled 2013-09-30: qty 2

## 2013-09-30 MED ORDER — HYDROMORPHONE HCL PF 1 MG/ML IJ SOLN: 1.0000 mg | INTRAMUSCULAR | Status: DC | PRN

## 2013-09-30 MED ORDER — ACETAMINOPHEN 325 MG PO TABS
325.0000 mg | ORAL_TABLET | Freq: Once | ORAL | Status: AC
  Administered 2013-09-30: 325 mg via ORAL
  Filled 2013-09-30: qty 1

## 2013-09-30 MED ORDER — ONDANSETRON HCL 4 MG/2ML IJ SOLN: 4.0000 mg | Freq: Four times a day (QID) | INTRAMUSCULAR | Status: DC | PRN

## 2013-09-30 MED ORDER — VANCOMYCIN HCL 10 G IV SOLR
1500.0000 mg | Freq: Two times a day (BID) | INTRAVENOUS | Status: DC
  Administered 2013-10-01 – 2013-10-03 (×6): 1500 mg via INTRAVENOUS
  Filled 2013-09-30 (×7): qty 1500

## 2013-09-30 MED ORDER — ONDANSETRON HCL 4 MG PO TABS: 4.0000 mg | ORAL_TABLET | Freq: Four times a day (QID) | ORAL | Status: DC | PRN

## 2013-09-30 MED ORDER — FENTANYL CITRATE 0.05 MG/ML IJ SOLN
100.0000 ug | Freq: Once | INTRAMUSCULAR | Status: AC
  Administered 2013-09-30: 100 ug via INTRAVENOUS
  Filled 2013-09-30: qty 2

## 2013-09-30 MED ORDER — SODIUM CHLORIDE 0.9 % IV SOLN
INTRAVENOUS | Status: DC
  Administered 2013-09-30 – 2013-10-01 (×2): via INTRAVENOUS

## 2013-09-30 MED ORDER — PIPERACILLIN-TAZOBACTAM 3.375 G IVPB 30 MIN
3.3750 g | Freq: Once | INTRAVENOUS | Status: AC
  Administered 2013-09-30: 3.375 g via INTRAVENOUS
  Filled 2013-09-30: qty 50

## 2013-09-30 MED ORDER — ALUM & MAG HYDROXIDE-SIMETH 200-200-20 MG/5ML PO SUSP: 30.0000 mL | Freq: Four times a day (QID) | ORAL | Status: DC | PRN

## 2013-09-30 MED ORDER — ACETAMINOPHEN 325 MG PO TABS
650.0000 mg | ORAL_TABLET | Freq: Four times a day (QID) | ORAL | Status: DC | PRN
  Administered 2013-10-01 – 2013-10-03 (×3): 650 mg via ORAL
  Filled 2013-09-30 (×3): qty 2

## 2013-09-30 MED ORDER — VANCOMYCIN HCL IN DEXTROSE 1-5 GM/200ML-% IV SOLN: 1000.0000 mg | Freq: Once | INTRAVENOUS | Status: DC

## 2013-09-30 NOTE — Consult Note (Signed)
Reason for Consult: Atrial fibrillation with rapid ventricular response/hypotension Referring Physician: [facility]  Michele Weber is an 43 y.o. female.  HPI: Patient is 43 year old female as medical history significant for morbid obesity, obstructive sleep apnea on CPAP, history of lymphedema of lower extremities, history of congestive heart failure secondary to preserved LV systolic function, Worman D. deficiency, history of recurrent cellulitis, resident of [facility] was admitted earlier today because of pain swelling redness and draining ulcers over both legs associated with high-grade fever and increased heart rate. Patient was noted to be in A. fib with RVR NED and was hypotensive received IV fluids and one dose of IV dig with control of heart rate in 90s and improvement in blood pressure in 12W systolic. Patient presently denies any dizziness lightheadedness. Denies any palpitations. Denies chest pain. Complains of musculoskeletal arm pain and leg pains. Denies shortness of breath. Patient has been on Xarelto for last 6 weeks for atrial fibrillation and has remained in atrial fibrillation.  Past Medical History  Diagnosis Date  . Morbid obesity   . Lymphedema of both lower extremities   . Diastolic CHF   . Vitamin D deficiency     History reviewed. No pertinent past surgical history.  Family History  Problem Relation Age of Onset  . Diabetes Father     Social History:  reports that she has never smoked. She does not have any smokeless tobacco history on file. She reports that she does not drink alcohol or use illicit drugs.  Allergies: No Known Allergies  Medications: I have reviewed the patient's current medications.  Results for orders placed during the hospital encounter of 09/30/13 (from the past 48 hour(s))  CBC WITH DIFFERENTIAL     Status: Abnormal   Collection Time    09/30/13  2:40 PM      Result Value Ref Range   WBC 8.9  4.0 - 10.5 K/uL   RBC 4.07  3.87 -  5.11 MIL/uL   Hemoglobin 11.4 (*) 12.0 - 15.0 g/dL   HCT 35.5 (*) 36.0 - 46.0 %   MCV 87.2  78.0 - 100.0 fL   MCH 28.0  26.0 - 34.0 pg   MCHC 32.1  30.0 - 36.0 g/dL   RDW 16.9 (*) 11.5 - 15.5 %   Platelets 196  150 - 400 K/uL   Neutrophils Relative % 93 (*) 43 - 77 %   Neutro Abs 8.3 (*) 1.7 - 7.7 K/uL   Lymphocytes Relative 4 (*) 12 - 46 %   Lymphs Abs 0.3 (*) 0.7 - 4.0 K/uL   Monocytes Relative 3  3 - 12 %   Monocytes Absolute 0.3  0.1 - 1.0 K/uL   Eosinophils Relative 0  0 - 5 %   Eosinophils Absolute 0.0  0.0 - 0.7 K/uL   Basophils Relative 0  0 - 1 %   Basophils Absolute 0.0  0.0 - 0.1 K/uL  COMPREHENSIVE METABOLIC PANEL     Status: Abnormal   Collection Time    09/30/13  2:40 PM      Result Value Ref Range   Sodium 138  137 - 147 mEq/L   Potassium 4.4  3.7 - 5.3 mEq/L   Chloride 99  96 - 112 mEq/L   CO2 24  19 - 32 mEq/L   Glucose, Bld 95  70 - 99 mg/dL   BUN 20  6 - 23 mg/dL   Creatinine, Ser 0.99  0.50 - 1.10 mg/dL   Calcium  8.2 (*) 8.4 - 10.5 mg/dL   Total Protein 7.1  6.0 - 8.3 g/dL   Albumin 2.4 (*) 3.5 - 5.2 g/dL   AST 8  0 - 37 U/L   ALT <5  0 - 35 U/L   Alkaline Phosphatase 41  39 - 117 U/L   Total Bilirubin 1.0  0.3 - 1.2 mg/dL   GFR calc non Af Amer 68 (*) >90 mL/min   GFR calc Af Amer 79 (*) >90 mL/min   Comment: (NOTE)     The eGFR has been calculated using the CKD EPI equation.     This calculation has not been validated in all clinical situations.     eGFR's persistently <90 mL/min signify possible Chronic Kidney     Disease.   Anion gap 15  5 - 15  TROPONIN I     Status: None   Collection Time    09/30/13  2:40 PM      Result Value Ref Range   Troponin I <0.30  <0.30 ng/mL   Comment:            Due to the release kinetics of cTnI,     a negative result within the first hours     of the onset of symptoms does not rule out     myocardial infarction with certainty.     If myocardial infarction is still suspected,     repeat the test at  appropriate intervals.  I-STAT CG4 LACTIC ACID, ED     Status: Abnormal   Collection Time    09/30/13  2:55 PM      Result Value Ref Range   Lactic Acid, Venous 3.24 (*) 0.5 - 2.2 mmol/L  URINALYSIS, ROUTINE W REFLEX MICROSCOPIC     Status: Abnormal   Collection Time    09/30/13  3:50 PM      Result Value Ref Range   Color, Urine YELLOW  YELLOW   APPearance CLEAR  CLEAR   Specific Gravity, Urine 1.014  1.005 - 1.030   pH 8.0  5.0 - 8.0   Glucose, UA NEGATIVE  NEGATIVE mg/dL   Hgb urine dipstick SMALL (*) NEGATIVE   Bilirubin Urine NEGATIVE  NEGATIVE   Ketones, ur NEGATIVE  NEGATIVE mg/dL   Protein, ur NEGATIVE  NEGATIVE mg/dL   Urobilinogen, UA 0.2  0.0 - 1.0 mg/dL   Nitrite NEGATIVE  NEGATIVE   Leukocytes, UA MODERATE (*) NEGATIVE  URINE MICROSCOPIC-ADD ON     Status: Abnormal   Collection Time    09/30/13  3:50 PM      Result Value Ref Range   Squamous Epithelial / LPF RARE  RARE   WBC, UA 11-20  <3 WBC/hpf   RBC / HPF 3-6  <3 RBC/hpf   Bacteria, UA MANY (*) RARE    Dg Chest Port 1 View  09/30/2013   CLINICAL DATA:  Sepsis. Atrial fibrillation. Congestive heart failure. Morbid obesity.  EXAM: PORTABLE CHEST - 1 VIEW  COMPARISON:  08/11/2013  FINDINGS: Mild cardiomegaly and pulmonary vascular congestion remain stable. No evidence of pulmonary consolidation or pleural effusion. Technically suboptimal exam due to large patient habitus.  IMPRESSION: Stable mild cardiomegaly and pulmonary vascular congestion. No acute findings.   Electronically Signed   By: Earle Gell M.D.   On: 09/30/2013 15:25    Review of Systems  Constitutional: Positive for fever.  Eyes: Negative for double vision, photophobia and pain.  Cardiovascular: Positive for leg swelling. Negative for  chest pain.  Gastrointestinal: Negative for nausea, vomiting and abdominal pain.  Neurological: Negative for dizziness and headaches.   Blood pressure 84/39, pulse 110, temperature 100 F (37.8 C), temperature  source Oral, resp. rate 19, last menstrual period 07/31/2013, SpO2 94.00%. Physical Exam  HENT:  Head: Normocephalic.  Eyes: Conjunctivae are normal. Pupils are equal, round, and reactive to light. No scleral icterus.  Neck: Normal range of motion. Neck supple. No JVD present. No thyromegaly present.  Cardiovascular:  Irregularly irregular S1 and S2 soft  Respiratory: Effort normal and breath sounds normal. No respiratory distress. She has no wheezes. She has no rales.  GI: Soft. Bowel sounds are normal. She exhibits distension. There is no tenderness.  Musculoskeletal:  No clubbing cyanosis massive bilateral lymphedema with erythema left leg and open ulcers also draining ulcer right leg    Assessment/Plan: Atrial fibrillation with moderate ventricular response now Cellulitis of legs  Massive chronic bilateral lymphedema Obstructive sleep apnea on CPAP Morbidly obese Compensated and congestive heart failure secondary to preserved LV systolic function Vitamin D deficiency Plan Continue present management We'll add IV amiodarone as per orders. We'll hold off beta blockers and calcium channel blockers for now in view of hypotension. BP has improved with control of heart rate and IV fluids . Dr. Doylene Canard is on call for weekend [facility] 09/30/2013, 5:26 PM

## 2013-09-30 NOTE — ED Provider Notes (Addendum)
CSN: [id number]     Arrival date & time 09/30/13  1358 History   First MD Initiated Contact with Patient 09/30/13 1417     Chief Complaint  Patient presents with  . Atrial Fibrillation  . Weakness     (Consider location/radiation/quality/duration/timing/severity/associated sxs/prior Treatment) HPI Complains of left leg pain and rapid heartbeat and fever onset 3 AM today. Pain is moderate to severe. Worse with pressing on the area associated symptoms include redness and swelling of the right leg. No other associated symptoms. No treatment prior to coming here. No cough. Past Medical History  Diagnosis Date  . Morbid obesity   . Lymphedema of both lower extremities   . Diastolic CHF   . Vitamin D deficiency    History reviewed. No pertinent past surgical history. Family History  Problem Relation Age of Onset  . Diabetes Father    History  Substance Use Topics  . Smoking status: Never Smoker   . Smokeless tobacco: Not on file  . Alcohol Use: No   OB History   Grav Para Term Preterm Abortions TAB SAB Ect Mult Living                 Review of Systems  Constitutional: Negative.   HENT: Negative.   Respiratory: Negative.   Cardiovascular: Positive for palpitations.       Tachycardia  Gastrointestinal: Negative.   Genitourinary:       Chronic indwelling Foley  Musculoskeletal: Positive for gait problem.       Bedbound  Skin: Positive for wound.       Drainage from left leg redness at left leg  Neurological: Negative.   Psychiatric/Behavioral: Negative.   All other systems reviewed and are negative.     Allergies  Review of patient's allergies indicates no known allergies.  Home Medications   Prior to Admission medications   Medication Sig Start Date End Date Taking? Authorizing Provider  acetaminophen (TYLENOL 8 HOUR) 650 MG CR tablet Take 650 mg by mouth every 8 (eight) hours as needed for pain.    Historical Provider, MD  Ascorbic Acid (VITAMIN C PO) Take 1  tablet by mouth 2 (two) times daily.    Historical Provider, MD  diltiazem (CARDIZEM CD) 180 MG 24 hr capsule Take 1 capsule (180 mg total) by mouth daily. 08/15/13   Christiane Ha, MD  furosemide (LASIX) 40 MG tablet Take 40 mg by mouth 2 (two) times daily.    Historical Provider, MD  metoprolol tartrate (LOPRESSOR) 12.5 mg TABS tablet Take 1.5 tablets (37.5 mg total) by mouth 3 (three) times daily. 08/15/13   Christiane Ha, MD  potassium chloride SA (K-DUR,KLOR-CON) 20 MEQ tablet Take 40 mEq by mouth 2 (two) times daily.    Historical Provider, MD  rivaroxaban (XARELTO) 20 MG TABS tablet Take 1 tablet (20 mg total) by mouth daily with supper. 08/15/13   Christiane Ha, MD   BP 100/56  Pulse 148  Temp(Src) 101.2 F (38.4 C) (Oral)  Resp 18  SpO2 93%  LMP 07/31/2013 Physical Exam  Nursing note and vitals reviewed. Constitutional: She is oriented to person, place, and time. She appears well-developed and well-nourished. No distress.  Alert Glasgow Coma Score 15  HENT:  Head: Normocephalic and atraumatic.  Mucous membranes dry  Eyes: Conjunctivae are normal. Pupils are equal, round, and reactive to light.  Neck: Neck supple. No tracheal deviation present. No thyromegaly present.  Cardiovascular:  No murmur heard. Tachycardic irregularly irregular  Pulmonary/Chest: Effort normal and breath sounds normal.  Abdominal: Soft. Bowel sounds are normal. She exhibits no distension. There is no tenderness.  Morbidly obese  Musculoskeletal: Normal range of motion. She exhibits no edema and no tenderness.  Left lower extremity with open wounds and medial thigh. Clean appearing. Medial thigh and medial leg are red and swollen warm and tender. All other extremities without tenderness, neurovascularly intact.  Neurological: She is alert and oriented to person, place, and time. No cranial nerve deficit. Coordination normal.  Skin: Skin is warm and dry. No rash noted.  Psychiatric: She has a  normal mood and affect.    ED Course  Procedures (including critical care time) Labs Review Labs Reviewed  CBC WITH DIFFERENTIAL - Abnormal; Notable for the following:    Hemoglobin 11.4 (*)    HCT 35.5 (*)    RDW 16.9 (*)    Neutrophils Relative % 93 (*)    Neutro Abs 8.3 (*)    Lymphocytes Relative 4 (*)    Lymphs Abs 0.3 (*)    All other components within normal limits  I-STAT CG4 LACTIC ACID, ED - Abnormal; Notable for the following:    Lactic Acid, Venous 3.24 (*)    All other components within normal limits  CULTURE, BLOOD (ROUTINE X 2)  CULTURE, BLOOD (ROUTINE X 2)  URINE CULTURE  COMPREHENSIVE METABOLIC PANEL  URINALYSIS, ROUTINE W REFLEX MICROSCOPIC  PROCALCITONIN  TROPONIN I   Date: 09/30/2013  Rate: 150  Rhythm: atrial fibrillation  QRS Axis: normal  Intervals: normal  ST/T Wave abnormalities: nonspecific T wave changes  Conduction Disutrbances:none  Narrative Interpretation:   Old EKG Reviewed: No significant change from 08/11/2013 interpreted by me  Chest xray viewed by me Imaging Review No results found.   EKG Interpretation None      Results for orders placed during the hospital encounter of 09/30/13  CBC WITH DIFFERENTIAL      Result Value Ref Range   WBC 8.9  4.0 - 10.5 K/uL   RBC 4.07  3.87 - 5.11 MIL/uL   Hemoglobin 11.4 (*) 12.0 - 15.0 g/dL   HCT 28.435.5 (*) 13.236.0 - 44.046.0 %   MCV 87.2  78.0 - 100.0 fL   MCH 28.0  26.0 - 34.0 pg   MCHC 32.1  30.0 - 36.0 g/dL   RDW 10.216.9 (*) 72.511.5 - 36.615.5 %   Platelets 196  150 - 400 K/uL   Neutrophils Relative % 93 (*) 43 - 77 %   Neutro Abs 8.3 (*) 1.7 - 7.7 K/uL   Lymphocytes Relative 4 (*) 12 - 46 %   Lymphs Abs 0.3 (*) 0.7 - 4.0 K/uL   Monocytes Relative 3  3 - 12 %   Monocytes Absolute 0.3  0.1 - 1.0 K/uL   Eosinophils Relative 0  0 - 5 %   Eosinophils Absolute 0.0  0.0 - 0.7 K/uL   Basophils Relative 0  0 - 1 %   Basophils Absolute 0.0  0.0 - 0.1 K/uL  COMPREHENSIVE METABOLIC PANEL      Result Value  Ref Range   Sodium 138  137 - 147 mEq/L   Potassium 4.4  3.7 - 5.3 mEq/L   Chloride 99  96 - 112 mEq/L   CO2 24  19 - 32 mEq/L   Glucose, Bld 95  70 - 99 mg/dL   BUN 20  6 - 23 mg/dL   Creatinine, Ser 4.400.99  0.50 - 1.10 mg/dL   Calcium  8.2 (*) 8.4 - 10.5 mg/dL   Total Protein 7.1  6.0 - 8.3 g/dL   Albumin 2.4 (*) 3.5 - 5.2 g/dL   AST 8  0 - 37 U/L   ALT <5  0 - 35 U/L   Alkaline Phosphatase 41  39 - 117 U/L   Total Bilirubin 1.0  0.3 - 1.2 mg/dL   GFR calc non Af Amer 68 (*) >90 mL/min   GFR calc Af Amer 79 (*) >90 mL/min   Anion gap 15  5 - 15  TROPONIN I      Result Value Ref Range   Troponin I <0.30  <0.30 ng/mL  I-STAT CG4 LACTIC ACID, ED      Result Value Ref Range   Lactic Acid, Venous 3.24 (*) 0.5 - 2.2 mmol/L   Dg Chest Port 1 View  09/30/2013   CLINICAL DATA:  Sepsis. Atrial fibrillation. Congestive heart failure. Morbid obesity.  EXAM: PORTABLE CHEST - 1 VIEW  COMPARISON:  08/11/2013  FINDINGS: Mild cardiomegaly and pulmonary vascular congestion remain stable. No evidence of pulmonary consolidation or pleural effusion. Technically suboptimal exam due to large patient habitus.  IMPRESSION: Stable mild cardiomegaly and pulmonary vascular congestion. No acute findings.   Electronically Signed   By: Myles Rosenthal M.D.   On: 09/30/2013 15:25    Results for orders placed during the hospital encounter of 09/30/13  CBC WITH DIFFERENTIAL      Result Value Ref Range   WBC 8.9  4.0 - 10.5 K/uL   RBC 4.07  3.87 - 5.11 MIL/uL   Hemoglobin 11.4 (*) 12.0 - 15.0 g/dL   HCT 16.1 (*) 09.6 - 04.5 %   MCV 87.2  78.0 - 100.0 fL   MCH 28.0  26.0 - 34.0 pg   MCHC 32.1  30.0 - 36.0 g/dL   RDW 40.9 (*) 81.1 - 91.4 %   Platelets 196  150 - 400 K/uL   Neutrophils Relative % 93 (*) 43 - 77 %   Neutro Abs 8.3 (*) 1.7 - 7.7 K/uL   Lymphocytes Relative 4 (*) 12 - 46 %   Lymphs Abs 0.3 (*) 0.7 - 4.0 K/uL   Monocytes Relative 3  3 - 12 %   Monocytes Absolute 0.3  0.1 - 1.0 K/uL   Eosinophils  Relative 0  0 - 5 %   Eosinophils Absolute 0.0  0.0 - 0.7 K/uL   Basophils Relative 0  0 - 1 %   Basophils Absolute 0.0  0.0 - 0.1 K/uL  COMPREHENSIVE METABOLIC PANEL      Result Value Ref Range   Sodium 138  137 - 147 mEq/L   Potassium 4.4  3.7 - 5.3 mEq/L   Chloride 99  96 - 112 mEq/L   CO2 24  19 - 32 mEq/L   Glucose, Bld 95  70 - 99 mg/dL   BUN 20  6 - 23 mg/dL   Creatinine, Ser 7.82  0.50 - 1.10 mg/dL   Calcium 8.2 (*) 8.4 - 10.5 mg/dL   Total Protein 7.1  6.0 - 8.3 g/dL   Albumin 2.4 (*) 3.5 - 5.2 g/dL   AST 8  0 - 37 U/L   ALT <5  0 - 35 U/L   Alkaline Phosphatase 41  39 - 117 U/L   Total Bilirubin 1.0  0.3 - 1.2 mg/dL   GFR calc non Af Amer 68 (*) >90 mL/min   GFR calc Af Amer 79 (*) >90 mL/min  Anion gap 15  5 - 15  TROPONIN I      Result Value Ref Range   Troponin I <0.30  <0.30 ng/mL  I-STAT CG4 LACTIC ACID, ED      Result Value Ref Range   Lactic Acid, Venous 3.24 (*) 0.5 - 2.2 mmol/L   Dg Chest Port 1 View  09/30/2013   CLINICAL DATA:  Sepsis. Atrial fibrillation. Congestive heart failure. Morbid obesity.  EXAM: PORTABLE CHEST - 1 VIEW  COMPARISON:  08/11/2013  FINDINGS: Mild cardiomegaly and pulmonary vascular congestion remain stable. No evidence of pulmonary consolidation or pleural effusion. Technically suboptimal exam due to large patient habitus.  IMPRESSION: Stable mild cardiomegaly and pulmonary vascular congestion. No acute findings.   Electronically Signed   By: Myles Rosenthal M.D.   On: 09/30/2013 15:25    445 p.m. pain improved after treatment with intravenous opioids his remains alert talkative. Glasgow Coma Score 15. Heart rate has come down with intravenous fluids and Tylenol MDM  Code sepsis called based on search criteria for temperature heart rate suspected source is cellulitis of left leg. Fluid bolus, pan cultures, broad-spectrum antibiotics ordered. Since heart rate improving with Tylenol and intravenous fluids, no need for intravenous  antiarrhythmics Spoke with Dr.Rai Plan admit telemetry Final diagnoses:  None   diagnosis #1sepsis #2 cellulitis of left leg #3 atrial fibrillation with rapid ventricular response  CRITICAL CARE Performed by: Doug Sou Total critical care time: 30 minute Critical care time was exclusive of separately billable procedures and treating other patients. Critical care was necessary to treat or prevent imminent or life-threatening deterioration. Critical care was time spent personally by me on the following activities: development of treatment plan with patient and/or surrogate as well as nursing, discussions with consultants, evaluation of patient's response to treatment, examination of patient, obtaining history from patient or surrogate, ordering and performing treatments and interventions, ordering and review of laboratory studies, ordering and review of radiographic studies, pulse oximetry and re-evaluation of patient's condition.    Doug Sou, MD 09/30/13 1551  Doug Sou, MD 09/30/13 1551  Correction. Patient will be admitted to step down unit  Doug Sou, MD 09/30/13 [phone]

## 2013-09-30 NOTE — ED Notes (Signed)
Pt continues to be monitored by blood pressure, 12 lead, and pulse ox.  

## 2013-09-30 NOTE — H&P (Signed)
History and Physical       Hospital Admission Note Date: 09/30/2013  Patient name: Michele MowersSharon Weber Medical record number: [id number] Date of birth: 05-31-1970 Age: 43 y.o. Gender: female  PCP: DASANAYAKA,GAYANI, MD    Chief Complaint:  Fever with left leg pain since morning  HPI: Patient is a 43 year old female with history of morbid obesity, paroxysmal atrial fibrillation on xarelto, lymphedema, diastolic CHF, obstructive sleep apnea [facility] leg pain, rapid heart rate, fever today. History was obtained from the patient who reported that she was having a fever of 101.2 at the facility, was hypotensive BP 85/35 and heart rate 141. She has a history of recurrent cellulitis, thought that her leg was infected. She was having increasing pain in the left leg. Otherwise she denied any nausea, vomiting, chest pain, shortness of breath. Patient has an open wound on the right leg, marked lymphedema in both legs with cellulitic changes on the left leg. Hospitalist service was requested from admission.  Review of Systems:  Constitutional: + fever, chills, diaphoresis, + poor appetite and fatigue.  HEENT: Denies photophobia, eye pain, redness, hearing loss, ear pain, congestion, sore throat, rhinorrhea, sneezing, mouth sores, trouble swallowing, neck pain, neck stiffness and tinnitus.   Respiratory: Denies SOB, DOE, cough, chest tightness,  and wheezing.   Cardiovascular: Denies chest pain, palpitations and leg swelling.  Gastrointestinal: Denies nausea, vomiting, abdominal pain, diarrhea, constipation, blood in stool and abdominal distention.  Genitourinary: Denies dysuria, urgency, frequency, hematuria, flank pain and difficulty urinating.  Musculoskeletal: Denies myalgias, back pain, joint swelling, arthralgias and gait problem.  Skin: See history of present illness  Neurological: Denies  dizziness, seizures, syncope, weakness, light-headedness, numbness and headaches.  Hematological: Denies adenopathy. Easy bruising, personal or family bleeding history  Psychiatric/Behavioral: Denies suicidal ideation, mood changes, confusion, nervousness, sleep disturbance and agitation  Past Medical History: Past Medical History  Diagnosis Date  . Morbid obesity   . Lymphedema of both lower extremities   . Diastolic CHF   . Vitamin D deficiency    History reviewed. No pertinent past surgical history.  Medications: Prior to Admission medications   Medication Sig Start Date End Date Taking? Authorizing Provider  acetaminophen (TYLENOL 8 HOUR) 650 MG CR tablet Take 650 mg by mouth every 8 (eight) hours as needed for pain.   Yes Historical Provider, MD  diclofenac sodium (VOLTAREN) 1 % GEL Apply 2 g topically 4 (four) times daily as needed (for knee pain).    Yes Historical Provider, MD  diltiazem (CARDIZEM CD) 180 MG 24 hr capsule Take 1 capsule (180 mg total) by mouth daily. 08/15/13  Yes Christiane Haorinna L Sullivan, MD  furosemide (LASIX) 40 MG tablet Take 60 mg by mouth 2 (two) times daily.    Yes Historical Provider, MD  metoprolol tartrate (LOPRESSOR) 25 MG tablet Take 37.5 mg by mouth 3 (three) times daily.   Yes Historical Provider, MD  OVER THE COUNTER MEDICATION Inhale 1 application into the lungs at bedtime. CPAP-at bedtime   Yes Historical Provider, MD  potassium chloride SA (K-DUR,KLOR-CON) 20 MEQ tablet Take 40 mEq by mouth 2 (two) times daily.   Yes Historical Provider, MD  rivaroxaban (XARELTO) 20 MG TABS tablet Take 1 tablet (20 mg total) by mouth daily with supper. 08/15/13  Yes Christiane Haorinna L Sullivan, MD  vitamin C (ASCORBIC ACID) 500 MG tablet Take 500 mg by mouth 2 (two) times daily.   Yes Historical Provider, MD  Allergies:  No Known Allergies  Social History:  reports that she has never smoked. She does not have any smokeless tobacco history on file. She reports that she does  not drink alcohol or use illicit drugs.  Family History: Family History  Problem Relation Age of Onset  . Diabetes Father     Physical Exam: Blood pressure 88/41, pulse 119, temperature 100 F (37.8 C), temperature source Oral, resp. rate 21, last menstrual period 07/31/2013, SpO2 94.00%. General: Alert, awake, oriented x3, in no acute distress. HEENT: normocephalic, atraumatic, anicteric sclera, pink conjunctiva, pupils equal and reactive to light and accomodation, oropharynx clear Neck: supple, no masses or lymphadenopathy, no goiter, no bruits  Heart: Irregularly irregular  without murmurs, rubs or gallops. Lungs: Clear to auscultation bilaterally, no wheezing Abdomen: Morbid obesity Soft, nontender, nondistended, positive bowel sounds Extremities: No clubbing, cyanosis, marked lymphedema bilaterally, left lower extremity with marked cellulitic changes, right lower extremity with an open wound Neuro: Grossly intact, no focal neurological deficits, strength 5/5 upper and lower extremities bilaterally Psych: alert and oriented x 3, normal mood and affect Skin: no rashes or lesions, warm and dry   LABS on Admission:  Basic Metabolic Panel:  Recent Labs Lab 09/30/13 1440  NA 138  K 4.4  CL 99  CO2 24  GLUCOSE 95  BUN 20  CREATININE 0.99  CALCIUM 8.2*   Liver Function Tests:  Recent Labs Lab 09/30/13 1440  AST 8  ALT <5  ALKPHOS 41  BILITOT 1.0  PROT 7.1  ALBUMIN 2.4*   No results found for this basename: LIPASE, AMYLASE,  in the last 168 hours No results found for this basename: AMMONIA,  in the last 168 hours CBC:  Recent Labs Lab 09/30/13 1440  WBC 8.9  NEUTROABS 8.3*  HGB 11.4*  HCT 35.5*  MCV 87.2  PLT 196   Cardiac Enzymes:  Recent Labs Lab 09/30/13 1440  TROPONINI <0.30   BNP: No components found with this basename: POCBNP,  CBG: No results found for this basename: GLUCAP,  in the last 168 hours   Radiological Exams on Admission: Dg  Chest Port 1 View  09/30/2013   CLINICAL DATA:  Sepsis. Atrial fibrillation. Congestive heart failure. Morbid obesity.  EXAM: PORTABLE CHEST - 1 VIEW  COMPARISON:  08/11/2013  FINDINGS: Mild cardiomegaly and pulmonary vascular congestion remain stable. No evidence of pulmonary consolidation or pleural effusion. Technically suboptimal exam due to large patient habitus.  IMPRESSION: Stable mild cardiomegaly and pulmonary vascular congestion. No acute findings.   Electronically Signed   By: Myles Rosenthal M.D.   On: 09/30/2013 15:25    Assessment/Plan Principal Problem:   Sepsis/SIRS likely from cellulitis: With fever, hypotension, tachycardia , Lactic acidosis - Admit to step down, obtain procalcitonin, blood cultures x2, wound culture - Place on IV vancomycin and Zosyn  - Placed on IV fluids, pain control  Active Problems: Atrial fibrillation with RVR: Patient has a known history of paroxysmal atrial fibrillation, BP borderline - Will place on IV fluid hydration due to the sepsis to help with BP - One set of troponins negative, 2-D echo in 07/2013 showed EF of 55-60%, normal wall motion - Cardiology consult obtained, unable to place on beta blocker or CCB due to hypotension, will give one dose of digoxin 0.25 mg x1 - Continue xarelto   History of  Diastolic CHF: - Currently euvolemic, hold Lasix, placed on IV fluids due to sepsis and hypotension    Cellulitis - As #1 continue  IV vancomycin and Zosyn  Morbid obesity   DVT prophylaxis: Xarelto   CODE STATUS: Full code  Family Communication: Admission, patients condition and plan of care including tests being ordered have been discussed with the patient who indicates understanding and agree with the plan and Code Status   Further plan will depend as patient's clinical course evolves and further radiologic and laboratory data become available.   Time Spent on Admission: 1 hour  RAI,RIPUDEEP M.D. [facility] 09/30/2013, 4:21  PM Pager: [phone]  If 7PM-7AM, please contact night-coverage [email or web address] Password [email or web address]  **Disclaimer: This note was dictated with voice recognition software. Similar sounding words can inadvertently be transcribed and this note may contain transcription errors which may not have been corrected upon publication of note.**

## 2013-09-30 NOTE — ED Notes (Signed)
Per EMS pt c/o weakness and fever since last night.  Pt has increased pain in L leg. Pt sts she thinks her leg is infected. Currently in a-fib and tachycardia. Pt has a temp 101.2, BP 85/35 BP, HR 141.

## 2013-09-30 NOTE — ED Notes (Signed)
Per pt catheter was placed around the first of august and sediment was preventing urine from flowing into bag. Tubing and bag replaced during ED admission.

## 2013-09-30 NOTE — ED Notes (Signed)
Lactic acid results given to Dr. Patria Manecampos

## 2013-09-30 NOTE — Progress Notes (Addendum)
ANTIBIOTIC CONSULT NOTE - INITIAL  Pharmacy Consult for Vancomycin, Zosyn Indication: rule out sepsis  No Known Allergies  Patient Measurements:  Weight 263.5 kg on 09/05/13 Height 152 cm on 09/05/13  Vital Signs: Temp: 101.2 F (38.4 C) (08/14 1351) Temp src: Oral (08/14 1351) BP: 100/56 mmHg (08/14 1351) Pulse Rate: 148 (08/14 1351) Intake/Output from previous day:   Intake/Output from this shift:    Labs: No results found for this basename: WBC, HGB, PLT, LABCREA, CREATININE,  in the last 72 hours The CrCl is unknown because both a height and weight (above a minimum accepted value) are required for this calculation. No results found for this basename: VANCOTROUGH, VANCOPEAK, VANCORANDOM, GENTTROUGH, GENTPEAK, GENTRANDOM, TOBRATROUGH, TOBRAPEAK, TOBRARND, AMIKACINPEAK, AMIKACINTROU, AMIKACIN,  in the last 72 hours   Microbiology: No results found for this or any previous visit (from the past 720 hour(s)).  Medical History: Past Medical History  Diagnosis Date  . Morbid obesity   . Lymphedema of both lower extremities   . Diastolic CHF   . Vitamin D deficiency     Assessment: 43 YO obese female with r/o sepsis to start vancomycin and Zosyn. Patient with fever, hypotension, and in afib. Labs are pending. Cultures pending.   Goal of Therapy:  Vancomycin trough level 15-20 mcg/ml  Plan:  1. Vancomycin 2500mg  stat to ED.  2. Continue Zosyn 3.375g IV now.  3. Follow-up labs for further dosing.  Link SnufferJessica Millen, PharmD, BCPS Clinical Pharmacist [phone] 09/30/2013,2:38 PM   Addendum: Attended Code Sepsis -hand delivered Vancomycin and pulled Zosyn from pyxis. Instructed RN to give Zosyn first. IVF were infusing. Asked RN if needed anything else and was confirmed no.   Link SnufferJessica Millen, PharmD, BCPS Clinical Pharmacist [phone] 2:53 PM, 09/30/2013  Addendum #2: Scr is WNL at 0.99. WBC is WNL and lactic acid is elevated.   Plan: 1. After vanc load of 2500mg  start  1500mg  IV Q12H 2. Zosyn 3.375gm IV Q8H (4 hr inf) 3. F/u renal fxn, C&S, clinical status and [facility]  Riot Waterworth, PharmD, BCPS Pager # [phone] 09/30/2013 3:42 PM

## 2013-10-01 DIAGNOSIS — I5032 Chronic diastolic (congestive) heart failure: Secondary | ICD-10-CM

## 2013-10-01 DIAGNOSIS — I509 Heart failure, unspecified: Secondary | ICD-10-CM

## 2013-10-01 DIAGNOSIS — I890 Lymphedema, not elsewhere classified: Secondary | ICD-10-CM

## 2013-10-01 LAB — URINE CULTURE: Colony Count: 85000

## 2013-10-01 LAB — TROPONIN I: Troponin I: <0.3 ng/mL (ref <0.30)

## 2013-10-01 MED ORDER — DILTIAZEM HCL 60 MG PO TABS
60.0000 mg | ORAL_TABLET | Freq: Three times a day (TID) | ORAL | Status: DC
  Administered 2013-10-01 – 2013-10-04 (×8): 60 mg via ORAL
  Filled 2013-10-01 (×11): qty 1

## 2013-10-01 MED ORDER — DIGOXIN 0.25 MG/ML IJ SOLN
0.2500 mg | Freq: Once | INTRAMUSCULAR | Status: AC
  Administered 2013-10-01: 0.25 mg via INTRAVENOUS
  Filled 2013-10-01 (×2): qty 1

## 2013-10-01 MED ORDER — MUPIROCIN 2 % EX OINT
1.0000 "application " | TOPICAL_OINTMENT | Freq: Two times a day (BID) | CUTANEOUS | Status: DC
  Administered 2013-10-01 – 2013-10-04 (×7): 1 via NASAL
  Filled 2013-10-01: qty 22

## 2013-10-01 MED ORDER — CHLORHEXIDINE GLUCONATE CLOTH 2 % EX PADS
6.0000 | MEDICATED_PAD | Freq: Every day | CUTANEOUS | Status: DC
  Administered 2013-10-01 – 2013-10-04 (×4): 6 via TOPICAL

## 2013-10-01 MED ORDER — METOPROLOL TARTRATE 12.5 MG HALF TABLET
12.5000 mg | ORAL_TABLET | Freq: Three times a day (TID) | ORAL | Status: DC
  Administered 2013-10-01 – 2013-10-04 (×9): 12.5 mg via ORAL
  Filled 2013-10-01 (×12): qty 1

## 2013-10-01 NOTE — Progress Notes (Signed)
Pt's heart rate elevated 140's, went right back to 110's no chest pain no sob no dizziness. bp -100/59, iv infusing per order. Will cont to monitor

## 2013-10-01 NOTE — Progress Notes (Signed)
Ref: DASANAYAKA,GAYANI, MD   Subjective:  Hypotensive and tachycardic.  Objective:  Vital Signs in the last 24 hours: Temp:  [98.4 F (36.9 C)-102.9 F (39.4 C)] 101 F (38.3 C) (08/15 1430) Pulse Rate:  [92-140] 127 (08/15 1200) Cardiac Rhythm:  [-] Atrial fibrillation (08/15 1200) Resp:  [19-38] 25 (08/15 1200) BP: (84-126)/(24-59) 126/49 mmHg (08/15 1200) SpO2:  [92 %-100 %] 96 % (08/15 1200) Weight:  [294.838 kg (650 lb)] 294.838 kg (650 lb) (08/14 1800)  Physical Exam: BP Readings from Last 1 Encounters:  10/01/13 126/49    Wt Readings from Last 1 Encounters:  09/30/13 294.838 kg (650 lb)    Weight change:   HEENT: [place]/AT, Eyes-Brown, PERL, EOMI, Conjunctiva-Pink, Sclera-Non-icteric Neck: No JVD, No bruit, Trachea midline. Lungs:  Clear, Bilateral. Cardiac:  Regular rhythm, normal S1 and S2, no S3.  Abdomen:  Soft, non-tender. Extremities:  No edema present. No cyanosis. No clubbing. CNS: AxOx3, Cranial nerves grossly intact, moves all 4 extremities. Right handed. Skin: Warm and dry.   Intake/Output from previous day: 08/14 0701 - 08/15 0700 In: 220 [P.O.:120; I.V.:100] Out: 1076 [Urine:1075; Stool:1]    Lab Results: BMET    Component Value Date/Time   NA 138 09/30/2013 1440   NA 138 08/12/2013 0040   NA 139 08/11/2013 0402   NA 136* 05/13/2013   K 4.4 09/30/2013 1440   K 4.2 08/12/2013 0040   K 4.5 08/11/2013 0402   CL 99 09/30/2013 1440   CL 100 08/12/2013 0040   CL 101 08/11/2013 0402   CO2 24 09/30/2013 1440   CO2 27 08/12/2013 0040   CO2 30 08/11/2013 0402   GLUCOSE 95 09/30/2013 1440   GLUCOSE 104* 08/12/2013 0040   GLUCOSE 110* 08/11/2013 0402   BUN 20 09/30/2013 1440   BUN 15 08/12/2013 0040   BUN 17 08/11/2013 0402   BUN 11 05/13/2013   CREATININE 0.99 09/30/2013 1440   CREATININE 0.78 08/12/2013 0040   CREATININE 0.89 08/11/2013 0402   CREATININE 1.0 05/13/2013   CALCIUM 8.2* 09/30/2013 1440   CALCIUM 8.6 08/12/2013 0040   CALCIUM 8.6 08/11/2013 0402   GFRNONAA 68* 09/30/2013 1440   GFRNONAA >90 08/12/2013 0040   GFRNONAA 78* 08/11/2013 0402   GFRAA 79* 09/30/2013 1440   GFRAA >90 08/12/2013 0040   GFRAA >90 08/11/2013 0402   CBC    Component Value Date/Time   WBC 8.9 09/30/2013 1440   WBC 7.2 05/13/2013   RBC 4.07 09/30/2013 1440   HGB 11.4* 09/30/2013 1440   HCT 35.5* 09/30/2013 1440   PLT 196 09/30/2013 1440   MCV 87.2 09/30/2013 1440   MCH 28.0 09/30/2013 1440   MCHC 32.1 09/30/2013 1440   RDW 16.9* 09/30/2013 1440   LYMPHSABS 0.3* 09/30/2013 1440   MONOABS 0.3 09/30/2013 1440   EOSABS 0.0 09/30/2013 1440   BASOSABS 0.0 09/30/2013 1440   HEPATIC Function Panel  Recent Labs  08/11/13 0402 09/30/13 1440  PROT 7.7 7.1   HEMOGLOBIN A1C No components found with this basename: HGA1C,  MPG   CARDIAC ENZYMES Lab Results  Component Value Date   TROPONINI <0.30 10/01/2013   TROPONINI <0.30 09/30/2013   TROPONINI <0.30 09/30/2013   BNP No results found for this basename: PROBNP,  in the last 8760 hours TSH  Recent Labs  08/11/13 0402  TSH 3.980   CHOLESTEROL No results found for this basename: CHOL,  in the last 8760 hours  Scheduled Meds: . Chlorhexidine Gluconate Cloth  6 each  Topical Q0600  . mupirocin ointment  1 application Nasal BID  . piperacillin-tazobactam (ZOSYN)  IV  3.375 g Intravenous Q8H  . rivaroxaban  20 mg Oral Q supper  . vancomycin  1,500 mg Intravenous Q12H   Continuous Infusions: . amiodarone 30 mg/hr (10/01/13 1200)   PRN Meds:.acetaminophen, acetaminophen, alum & mag hydroxide-simeth, HYDROcodone-acetaminophen, HYDROmorphone (DILAUDID) injection, ondansetron (ZOFRAN) IV, ondansetron  Assessment/Plan: Atrial fibrillation with moderate ventricular response now  Cellulitis of legs  Massive chronic bilateral lymphedema  Obstructive sleep apnea on CPAP  Morbidly obese  Compensated and congestive heart failure secondary to preserved LV systolic function  Vitamin D deficiency  IV lanoxin since B-blocker  and diltiazem on hold from hypotension.    LOS: 1 day    Orpah CobbAjay Lory Nowaczyk  MD  10/01/2013, 4:03 PM

## 2013-10-01 NOTE — Progress Notes (Signed)
[facility] Progress Note   Michele MowersSharon Arduini ZOX:[id number] DOB: 01-17-1971 DOA: 09/30/2013 PCP: Karlene EinsteinASANAYAKA,GAYANI, MD  Brief narrative: Michele Weber is a 43 y.o. female admitted for sepsis from left leg cellulitis and for a-fib with RVR.    Subjective: Pain in left leg and weakness. No other complaints.   Assessment/Plan: Principal Problem:   Sepsis - has had recurrent infections of the left leg - cont Vanc and Zosyn and follow  Active Problems:   Atrial fibrillation with RVR - started on Amiodarone - as BP quite low holding other rate controlling meds    Diastolic CHF - compensated    Severe lymph edema - b/l legs    Hypotension - cont to treat infection and follow- stop IVF at this point to prevent fluid overload    Morbid obesity   Code Status: full code Family Communication: none Disposition Plan: follow in SDU- [facility] DVT prophylaxis: Xarelto Consultants: Cardiology Procedures: none  Antibiotics: Anti-infectives   Start     Dose/Rate Route Frequency Ordered Stop   10/01/13 0500  vancomycin (VANCOCIN) 1,500 mg in sodium chloride 0.9 % 500 mL IVPB     1,500 mg 250 mL/hr over 120 Minutes Intravenous Every 12 hours 09/30/13 1541     09/30/13 2100  piperacillin-tazobactam (ZOSYN) IVPB 3.375 g     3.375 g 12.5 mL/hr over 240 Minutes Intravenous Every 8 hours 09/30/13 1541     09/30/13 1445  vancomycin (VANCOCIN) 2,000 mg in sodium chloride 0.9 % 500 mL IVPB  Status:  Discontinued     2,000 mg 250 mL/hr over 120 Minutes Intravenous STAT 09/30/13 1440 09/30/13 1441   09/30/13 1445  vancomycin (VANCOCIN) 2,500 mg in sodium chloride 0.9 % 500 mL IVPB     2,500 mg 250 mL/hr over 120 Minutes Intravenous STAT 09/30/13 1441 09/30/13 1745   09/30/13 1430  piperacillin-tazobactam (ZOSYN) IVPB 3.375 g     3.375 g 100 mL/hr over 30 Minutes Intravenous  Once 09/30/13 1429 09/30/13 1545   09/30/13 1430  vancomycin (VANCOCIN) IVPB 1000  mg/200 mL premix  Status:  Discontinued     1,000 mg 200 mL/hr over 60 Minutes Intravenous  Once 09/30/13 1429 09/30/13 1440         Objective: Filed Weights   09/30/13 1800  Weight: 294.838 kg (650 lb)    Intake/Output Summary (Last 24 hours) at 10/01/13 1520 Last data filed at 10/01/13 1300  Gross per 24 hour  Intake    470 ml  Output   1476 ml  Net  -1006 ml     Vitals Filed Vitals:   10/01/13 0630 10/01/13 0700 10/01/13 0800 10/01/13 1200  BP: 95/48 97/49 98/59  126/49  Pulse: 112 123 127 127  Temp:  101 F (38.3 C)  102.9 F (39.4 C)  TempSrc:  Oral  Oral  Resp: 36 22 30 25   Height:      Weight:      SpO2: 100% 97% 99% 96%    Exam: General: No acute respiratory distress- morbidly obese Lungs: Clear to auscultation bilaterally without wheezes or crackles Cardiovascular: Regular rate and rhythm without murmur gallop or rub normal S1 and S2 Abdomen: Nontender, nondistended, soft, bowel sounds positive, no rebound, no ascites, no appreciable mass Extremities: No significant cyanosis, clubbing,+  edema bilateral lower extremities with cellulitis of left leg and draining wound on left thigh- clear yellow liquid is draining- no pus.   Data Reviewed: Basic Metabolic Panel:  Recent Labs Lab  09/30/13 1440  NA 138  K 4.4  CL 99  CO2 24  GLUCOSE 95  BUN 20  CREATININE 0.99  CALCIUM 8.2*   Liver Function Tests:  Recent Labs Lab 09/30/13 1440  AST 8  ALT <5  ALKPHOS 41  BILITOT 1.0  PROT 7.1  ALBUMIN 2.4*   No results found for this basename: LIPASE, AMYLASE,  in the last 168 hours No results found for this basename: AMMONIA,  in the last 168 hours CBC:  Recent Labs Lab 09/30/13 1440  WBC 8.9  NEUTROABS 8.3*  HGB 11.4*  HCT 35.5*  MCV 87.2  PLT 196   Cardiac Enzymes:  Recent Labs Lab 09/30/13 1440 09/30/13 2054 10/01/13 0305  TROPONINI <0.30 <0.30 <0.30   BNP (last 3 results) No results found for this basename: PROBNP,  in the last  8760 hours CBG: No results found for this basename: GLUCAP,  in the last 168 hours  Recent Results (from the past 240 hour(s))  MRSA PCR SCREENING     Status: Abnormal   Collection Time    09/30/13  6:59 PM      Result Value Ref Range Status   MRSA by PCR POSITIVE (*) NEGATIVE Final   Comment:            The GeneXpert MRSA Assay (FDA     approved for NASAL specimens     only), is one component of a     comprehensive MRSA colonization     surveillance program. It is not     intended to diagnose MRSA     infection nor to guide or     monitor treatment for     MRSA infections.     RESULT CALLED TO, READ BACK BY AND VERIFIED WITH:     Azzie Almas RN [id number] 09/30/13 A BROWNING     Studies:  Recent x-ray studies have been reviewed in detail by the Attending Physician  Scheduled Meds:  Scheduled Meds: . Chlorhexidine Gluconate Cloth  6 each Topical Q0600  . mupirocin ointment  1 application Nasal BID  . piperacillin-tazobactam (ZOSYN)  IV  3.375 g Intravenous Q8H  . rivaroxaban  20 mg Oral Q supper  . vancomycin  1,500 mg Intravenous Q12H   Continuous Infusions: . amiodarone 30 mg/hr (10/01/13 1200)    Time spent on care of this patient:   Natalyia Innes, MD 10/01/2013, 3:20 PM  LOS: 1 day   [facility] Office  [phone] Pager - Text Page per [email or web address]  If 7PM-7AM, please contact night-coverage [email or web address]

## 2013-10-01 NOTE — Progress Notes (Signed)
Patient hr elevated , pt having blood drawn at this time

## 2013-10-01 NOTE — Progress Notes (Signed)
Patient heart rate cont. To have runs in the 130's occ 140's . I paged on call cardiology md. Md will place new order for digoxin. Report given to tracey Rn. Lab unable to draw am labs I paged Dr. Emmie Niemannriad oncall regarding. I spoke with oncoming rn regarding.

## 2013-10-01 NOTE — Progress Notes (Signed)
CRITICAL VALUE ALERT  Critical value received:  Gram + cocci in clusters in aerobic culture  Date of notification:  10/01/13    Time of notification:1820      Critical value read back:Yes.     Nurse who received alert: Buckner Maltaracie Loise Esguerra  MD notified (1st page):  Dr. Butler Denmarkizwan  Time of first page: 1822  MD notified (2nd page):  Time of second page:  Responding MD:  Dr. Butler Denmarkizwan  Time MD responded:  [phone]

## 2013-10-02 DIAGNOSIS — I9589 Other hypotension: Secondary | ICD-10-CM

## 2013-10-02 LAB — CBC
HEMATOCRIT: 31.2 % — AB (ref 36.0–46.0)
Hemoglobin: 9.8 g/dL — ABNORMAL LOW (ref 12.0–15.0)
MCH: 27.5 pg (ref 26.0–34.0)
MCHC: 31.4 g/dL (ref 30.0–36.0)
MCV: 87.4 fL (ref 78.0–100.0)
Platelets: 183 10*3/uL (ref 150–400)
RBC: 3.57 MIL/uL — ABNORMAL LOW (ref 3.87–5.11)
RDW: 17.4 % — AB (ref 11.5–15.5)
WBC: 23.6 10*3/uL — ABNORMAL HIGH (ref 4.0–10.5)

## 2013-10-02 LAB — URINE CULTURE

## 2013-10-02 LAB — BASIC METABOLIC PANEL
Anion gap: 9 (ref 5–15)
BUN: 21 mg/dL (ref 6–23)
CO2: 27 mEq/L (ref 19–32)
CREATININE: 1.2 mg/dL — AB (ref 0.50–1.10)
Calcium: 8.1 mg/dL — ABNORMAL LOW (ref 8.4–10.5)
Chloride: 99 mEq/L (ref 96–112)
GFR calc Af Amer: 63 mL/min — ABNORMAL LOW (ref >90)
GFR, EST NON AFRICAN AMERICAN: 54 mL/min — AB (ref >90)
GLUCOSE: 94 mg/dL (ref 70–99)
Potassium: 3.9 mEq/L (ref 3.7–5.3)
Sodium: 135 mEq/L — ABNORMAL LOW (ref 137–147)

## 2013-10-02 LAB — PROCALCITONIN: Procalcitonin: 11.44 ng/mL

## 2013-10-02 LAB — VANCOMYCIN, TROUGH: Vancomycin Tr: 16.2 ug/mL (ref 10.0–20.0)

## 2013-10-02 MED ORDER — FUROSEMIDE 20 MG PO TABS
20.0000 mg | ORAL_TABLET | Freq: Two times a day (BID) | ORAL | Status: DC
  Administered 2013-10-02 – 2013-10-04 (×5): 20 mg via ORAL
  Filled 2013-10-02 (×6): qty 1

## 2013-10-02 MED ORDER — FUROSEMIDE 40 MG PO TABS: 40.0000 mg | ORAL_TABLET | Freq: Two times a day (BID) | ORAL | Status: DC

## 2013-10-02 MED ORDER — DICLOFENAC SODIUM 1 % TD GEL
2.0000 g | Freq: Four times a day (QID) | TRANSDERMAL | Status: DC
  Administered 2013-10-02 – 2013-10-04 (×8): 2 g via TOPICAL
  Filled 2013-10-02: qty 100

## 2013-10-02 MED ORDER — DIGOXIN 0.25 MG/ML IJ SOLN
0.2500 mg | Freq: Once | INTRAMUSCULAR | Status: AC
  Administered 2013-10-02: 0.25 mg via INTRAVENOUS
  Filled 2013-10-02: qty 1

## 2013-10-02 NOTE — Progress Notes (Signed)
CRITICAL VALUE ALERT  Critical value received:  Anaerobic blood cultures gram positive cocci and clusters  Date of notification:  10/02/13  Time of notification:  0122  Critical value read back:Yes.    Nurse who received alert:  D. Madelin Rearillon, RN  MD notified (1st page):  Dr. Suanne MarkerViyuoh  Time of first page:  0138  Responding MD:  Dr. Suanne MarkerViyuoh  Time MD responded:  [phone]  Patient receiving appropriate antibiotics per MD. No new orders.

## 2013-10-02 NOTE — Consult Note (Signed)
WOC wound consult note Reason for Consult: Consulted for assessment and suggestions for care to left medial thigh and pannus wounds as well as a small chronic wound on the right lateral thigh. Patient with lymphedema; while the condition has existed for several years, the patient reports "she has only had a name for this condition for a year and a half". Patient is followed in the SNF/Rehab facility in which she resides by Dr. Leanord Hawkingobson and has been receiving treatment for her lymphedema by a PT who is trained in the management of manual massage at the facility several times a week. Patient is currently on a bariatric sleep surface with low air loss feature. Patient states that the recent left medial thigh wounds initially presented as large fluid-filled blisters (bullae) that have ruptured. Wound type: Venous insufficiency and congestion resulting in ulceration; some moisture associated dermatitis (MASD), specifically intertriginous dermatitis (ITD) in the inframammary and skin folds at the bilateral groin Pressure Ulcer POA: No Measurement:Right lateral thigh: 0.5 x 1.5cm x 0.2cm pink, slightly moist wound bed.  Left medial thigh (two areas): 7cm x 5cm x 0.2cm and 13cm x 8.5cm x 0.2cm, pink, wet wound bed.  Small intact blister (serum-filled) at base of larger wound measuring 3cm x 2cm.  Base of pannus: 10cm x 10cm x 0.2cm area of recently ruptured serum-filled blister. Wound bed is wet dark red. Wound bed: As described above. Drainage (amount, consistency, odor) Serous, no odor.  Moderate to large amounts from left medial thigh; scant from right lateral thigh. Periwound:Macerated in the periwound areas, peau d'orange over bilateral medial thighs Dressing procedure/placement/frequency: Placement of a silver hydrofiber dressing (Aquacel Ag+, [employer] [place] [phone]#[id number]) over the open areas and topped with either gauze (to the right lateral thigh) or ABDs/dryflow underpad (to the left medial thigh wounds). I will provide our  antimicrobial textile  Charlane FerrettinterDry AG+, [employer] [phone]#[id number]) product for use in the inframammary and bilateral intertriginous areas.  It is noted that patient is on a systemic antibiotic which could lead to intertriginous dermatitis of a fungal nature. Recommend [facility] by Dr. Leanord Hawkingobson and PT for lymphedema management following stabilization and discharge. WOC nursing team will not follow, but will remain available to this patient, the nursing and medical teams  Please re-consult if needed. Thanks, Ladona MowLaurie Sybella Harnish, MSN, RN, GNP, AngosturaWOCN, CWON-AP [phone])

## 2013-10-02 NOTE — Evaluation (Signed)
Physical Therapy Evaluation Patient Details Name: Michele Weber MRN: [id number] DOB: 02-07-1971 Today's Date: 10/02/2013   History of Present Illness  43 yo F admitted with sepsis from LLE cellulitis. Pt also with a-fib, severe LE lymphedema and morbid obesity.  Up until several weeks ago pt reports she was able to stand and take a few steps, but essentially it was nonfunctional as [employer] did not have a chair she could sit in and they needed to use a lift to get her out of bed as hew feet won't toch the ground from her bed.  Clinical Impression  Pt admitted for above reasons.  PT was very limited with mobility PTA and was receiving PT at SNF.  Reports about a 200lb weight gain over last several weeks.  With the size of her legs currently it would not be possible for pt to get her feet close enough together in order to be able to stand.  PT will follow acutely to progress activity within pt's tolerance.  Would recommend pt transfer to a room with a [id number] and get a bariatric chair that she can sit in.       Follow Up Recommendations SNF;Supervision for mobility/OOB    Equipment Recommendations  None recommended by PT    Recommendations for Other Services       Precautions / Restrictions Precautions Precautions: Fall Restrictions Weight Bearing Restrictions: No      Mobility  Bed Mobility Overal bed mobility: Needs Assistance (Functional mobility not tested due to limited mobility PTA)                Transfers                    Ambulation/Gait                Stairs            Wheelchair Mobility    Modified Rankin (Stroke Patients Only)       Balance                                             Pertinent Vitals/Pain Pain Assessment:  (c/o pain in LE's and shoulders but did not rate)    Home Living Family/patient expects to be discharged to:: Skilled nursing facility [facility])                       Prior Function Level of Independence: Needs assistance   Gait / Transfers Assistance Needed: Able to take a few steps forward and backwards with PT  ADL's / Homemaking Assistance Needed: Staff assisted her with all ADL's. She states she could wash her front but required assist for her back and all pericare.         Hand Dominance   Dominant Hand: Right    Extremity/Trunk Assessment   Upper Extremity Assessment: Defer to OT evaluation           Lower Extremity Assessment: RLE deficits/detail;LLE deficits/detail RLE Deficits / Details: Very limited ROM throughout LE, except ankle was [facility]. Has about 30 degrees flexion of knee.  Soft tissue may prevent some ROM LLE Deficits / Details: As on RLE  Cervical / Trunk Assessment: Other exceptions (Not formally assessed)  Communication   Communication: No difficulties  Cognition Arousal/Alertness: Awake/alert Behavior During Therapy: WFL for tasks assessed/performed  Overall Cognitive Status: Within Functional Limits for tasks assessed                      General Comments General comments (skin integrity, edema, etc.): Pt with copious amounts of drainage from LE's. Several wounds dressed and several open wounds on LEs.     Exercises        Assessment/Plan    PT Assessment Patient needs continued PT services  PT Diagnosis Generalized weakness;Acute pain   PT Problem List Decreased strength;Decreased range of motion;Decreased activity tolerance;Decreased mobility;Obesity;Pain;Decreased skin integrity  PT Treatment Interventions DME instruction;Gait training;Therapeutic activities;Therapeutic exercise;Patient/family education   PT Goals (Current goals can be found in the Care Plan section) Acute Rehab PT Goals Patient Stated Goal: to work on exercises while she is here in the hospiital. PT Goal Formulation: With patient Time For Goal Achievement: 10/16/13 Potential to Achieve Goals: Good    Frequency Min 2X/week    Barriers to discharge        Co-evaluation               End of Session   Activity Tolerance: Patient tolerated treatment well Patient left: in bed;with call bell/phone within reach Nurse Communication: Mobility status;Need for lift equipment         Time: 1351-1416 PT Time Calculation (min): 25 min   Charges:   PT Evaluation $Initial PT Evaluation Tier I: 1 Procedure     PT G CodesDonnella Sham:          Marco Raper J 10/02/2013, 2:46 PM Lavona MoundMark Enis Riecke, PT  [phone] 10/02/2013

## 2013-10-02 NOTE — Progress Notes (Signed)
Ref: DASANAYAKA,GAYANI, MD   Subjective:  Improving heart rate and blood pressure. Bed ridden with weight over 600 lbs.  Objective:  Vital Signs in the last 24 hours: Temp:  [98.9 F (37.2 C)-102.9 F (39.4 C)] 99.5 F (37.5 C) (08/16 0700) Pulse Rate:  [85-141] 103 (08/16 0600) Cardiac Rhythm:  [-] Atrial fibrillation (08/16 0800) Resp:  [19-30] 21 (08/16 0600) BP: (97-126)/(43-83) 103/61 mmHg (08/16 0600) SpO2:  [91 %-98 %] 93 % (08/16 0600)  Physical Exam: BP Readings from Last 1 Encounters:  10/02/13 103/61    Wt Readings from Last 1 Encounters:  09/30/13 294.838 kg (650 lb)    Weight change:   HEENT: [place]/AT, Eyes-Brown, PERL, EOMI, Conjunctiva-Pale pink, Sclera-Non-icteric Neck: No JVD, No bruit, Trachea midline. Lungs:  Clear, Bilateral. Cardiac:  Regular rhythm, normal S1 and S2, no S3.  Abdomen:  Soft, non-tender. Obese. Extremities:  1 + edema present. No cyanosis. No clubbing. CNS: AxOx3, Cranial nerves grossly intact, moves all 4 extremities. Right handed. Skin: Warm and dry.   Intake/Output from previous day: 08/15 0701 - 08/16 0700 In: 2750.4 [P.O.:700; I.V.:300.4; IV Piggyback:1750] Out: 1175 [Urine:1175]    Lab Results: BMET    Component Value Date/Time   NA 135* 10/02/2013 0350   NA 138 09/30/2013 1440   NA 138 08/12/2013 0040   NA 136* 05/13/2013   K 3.9 10/02/2013 0350   K 4.4 09/30/2013 1440   K 4.2 08/12/2013 0040   CL 99 10/02/2013 0350   CL 99 09/30/2013 1440   CL 100 08/12/2013 0040   CO2 27 10/02/2013 0350   CO2 24 09/30/2013 1440   CO2 27 08/12/2013 0040   GLUCOSE 94 10/02/2013 0350   GLUCOSE 95 09/30/2013 1440   GLUCOSE 104* 08/12/2013 0040   BUN 21 10/02/2013 0350   BUN 20 09/30/2013 1440   BUN 15 08/12/2013 0040   BUN 11 05/13/2013   CREATININE 1.20* 10/02/2013 0350   CREATININE 0.99 09/30/2013 1440   CREATININE 0.78 08/12/2013 0040   CREATININE 1.0 05/13/2013   CALCIUM 8.1* 10/02/2013 0350   CALCIUM 8.2* 09/30/2013 1440   CALCIUM 8.6 08/12/2013  0040   GFRNONAA 54* 10/02/2013 0350   GFRNONAA 68* 09/30/2013 1440   GFRNONAA >90 08/12/2013 0040   GFRAA 63* 10/02/2013 0350   GFRAA 79* 09/30/2013 1440   GFRAA >90 08/12/2013 0040   CBC    Component Value Date/Time   WBC 23.6* 10/02/2013 0350   WBC 7.2 05/13/2013   RBC 3.57* 10/02/2013 0350   HGB 9.8* 10/02/2013 0350   HCT 31.2* 10/02/2013 0350   PLT 183 10/02/2013 0350   MCV 87.4 10/02/2013 0350   MCH 27.5 10/02/2013 0350   MCHC 31.4 10/02/2013 0350   RDW 17.4* 10/02/2013 0350   LYMPHSABS 0.3* 09/30/2013 1440   MONOABS 0.3 09/30/2013 1440   EOSABS 0.0 09/30/2013 1440   BASOSABS 0.0 09/30/2013 1440   HEPATIC Function Panel  Recent Labs  08/11/13 0402 09/30/13 1440  PROT 7.7 7.1   HEMOGLOBIN A1C No components found with this basename: HGA1C,  MPG   CARDIAC ENZYMES Lab Results  Component Value Date   TROPONINI <0.30 10/01/2013   TROPONINI <0.30 09/30/2013   TROPONINI <0.30 09/30/2013   BNP No results found for this basename: PROBNP,  in the last 8760 hours TSH  Recent Labs  08/11/13 0402  TSH 3.980   CHOLESTEROL No results found for this basename: CHOL,  in the last 8760 hours  Scheduled Meds: . Chlorhexidine Gluconate Cloth  6 each Topical Q0600  . diltiazem  60 mg Oral 3 times per day  . metoprolol tartrate  12.5 mg Oral 3 times per day  . mupirocin ointment  1 application Nasal BID  . piperacillin-tazobactam (ZOSYN)  IV  3.375 g Intravenous Q8H  . rivaroxaban  20 mg Oral Q supper  . vancomycin  1,500 mg Intravenous Q12H   Continuous Infusions: . amiodarone 30 mg/hr (10/02/13 0919)   PRN Meds:.acetaminophen, acetaminophen, alum & mag hydroxide-simeth, HYDROcodone-acetaminophen, HYDROmorphone (DILAUDID) injection, ondansetron (ZOFRAN) IV, ondansetron  Assessment/Plan: Atrial fibrillation with moderate ventricular response now  Cellulitis of legs  Possible sepsis Massive chronic bilateral lymphedema  Obstructive sleep apnea on CPAP  Morbidly obese  Compensated  and congestive heart failure secondary to preserved LV systolic function  Vitamin D deficiency  Continue diltiazem till has good control with IV amiodarone.    LOS: 2 days    Orpah Cobb  MD  10/02/2013, 9:36 AM

## 2013-10-02 NOTE — Progress Notes (Signed)
Placed pt on cpap as per order. Pt. Tolerating well at this time.

## 2013-10-02 NOTE — Progress Notes (Signed)
[facility] Progress Note   Michele Weber ZOX:[id number] DOB: 01/22/1971 DOA: 09/30/2013 PCP: Karlene Einstein, MD  Brief narrative: Michele Weber is a 42 y.o. female admitted for sepsis from left leg cellulitis and for a-fib with RVR. She is found to have gr pos cocci on the blood cultures.    Subjective: No complaints.   Assessment/Plan: Principal Problem:   Sepsis- gr pos cocci bacteremia and extensive left leg cellulitis - has had recurrent infections of the left leg - gr pos cocci in blood stream- one set has resulted in coag neg staph - cont Vanc and Zosyn for now and follow  Active Problems:   Atrial fibrillation with RVR - started on Amiodarone as BP quite low holding other rate controlling meds - BP has improving with treatment of sepsis and with hydration.  - Cardizem and Metoprolol resumed yesterday with noted improvement in rate which in now in 80s - cont Xarelto for stroke prevention    Diastolic CHF - compensated- will resume low dose Lasix today to prevent fluid overload    Severe lymph edema - b/l legs- severe blistering and weeping of legs large areas of skin sloughing    Hypotension - cont to treat infection and follow- stoped IVF to prevent fluid overload    Morbid obesity   Code Status: full code Family Communication: none Disposition Plan: follow in SDU- return to [facility] when stable DVT prophylaxis: Xarelto Consultants: Cardiology Procedures: none  Antibiotics: Anti-infectives   Start     Dose/Rate Route Frequency Ordered Stop   10/01/13 0500  vancomycin (VANCOCIN) 1,500 mg in sodium chloride 0.9 % 500 mL IVPB     1,500 mg 250 mL/hr over 120 Minutes Intravenous Every 12 hours 09/30/13 1541     09/30/13 2100  piperacillin-tazobactam (ZOSYN) IVPB 3.375 g     3.375 g 12.5 mL/hr over 240 Minutes Intravenous Every 8 hours 09/30/13 1541     09/30/13 1445  vancomycin (VANCOCIN) 2,000 mg in sodium chloride 0.9 % 500 mL IVPB  Status:   Discontinued     2,000 mg 250 mL/hr over 120 Minutes Intravenous STAT 09/30/13 1440 09/30/13 1441   09/30/13 1445  vancomycin (VANCOCIN) 2,500 mg in sodium chloride 0.9 % 500 mL IVPB     2,500 mg 250 mL/hr over 120 Minutes Intravenous STAT 09/30/13 1441 09/30/13 1745   09/30/13 1430  piperacillin-tazobactam (ZOSYN) IVPB 3.375 g     3.375 g 100 mL/hr over 30 Minutes Intravenous  Once 09/30/13 1429 09/30/13 1545   09/30/13 1430  vancomycin (VANCOCIN) IVPB 1000 mg/200 mL premix  Status:  Discontinued     1,000 mg 200 mL/hr over 60 Minutes Intravenous  Once 09/30/13 1429 09/30/13 1440         Objective: Filed Weights   09/30/13 1800  Weight: 294.838 kg (650 lb)    Intake/Output Summary (Last 24 hours) at 10/02/13 1325 Last data filed at 10/02/13 1100  Gross per 24 hour  Intake 2500.4 ml  Output   1375 ml  Net 1125.4 ml     Vitals Filed Vitals:   10/02/13 0442 10/02/13 0600 10/02/13 0700 10/02/13 1100  BP: 110/49 103/61    Pulse: 101 103    Temp: 98.9 F (37.2 C)  99.5 F (37.5 C) 99.5 F (37.5 C)  TempSrc: Oral  Oral Oral  Resp: 23 21    Height:      Weight:      SpO2: 97% 93%      Exam: General: No  acute respiratory distress- morbidly obese Lungs: Clear to auscultation bilaterally without wheezes or crackles Cardiovascular: Regular rate and rhythm without murmur gallop or rub normal S1 and S2 Abdomen: Nontender, nondistended, soft, bowel sounds positive, no rebound, no ascites, no appreciable mass Extremities: No significant cyanosis, clubbing,+  edema bilateral lower extremities with cellulitis of left leg and numerous draining wounds on left thigh- clear yellow liquid is draining- wound on right upper thigh is small but appears more deep - I am able to express thick tan/yellow material from this wound which we will send for culture.   Data Reviewed: Basic Metabolic Panel:  Recent Labs Lab 09/30/13 1440 10/02/13 0350  NA 138 135*  K 4.4 3.9  CL 99 99   CO2 24 27  GLUCOSE 95 94  BUN 20 21  CREATININE 0.99 1.20*  CALCIUM 8.2* 8.1*   Liver Function Tests:  Recent Labs Lab 09/30/13 1440  AST 8  ALT <5  ALKPHOS 41  BILITOT 1.0  PROT 7.1  ALBUMIN 2.4*   No results found for this basename: LIPASE, AMYLASE,  in the last 168 hours No results found for this basename: AMMONIA,  in the last 168 hours CBC:  Recent Labs Lab 09/30/13 1440 10/02/13 0350  WBC 8.9 23.6*  NEUTROABS 8.3*  --   HGB 11.4* 9.8*  HCT 35.5* 31.2*  MCV 87.2 87.4  PLT 196 183   Cardiac Enzymes:  Recent Labs Lab 09/30/13 1440 09/30/13 2054 10/01/13 0305  TROPONINI <0.30 <0.30 <0.30   BNP (last 3 results) No results found for this basename: PROBNP,  in the last 8760 hours CBG: No results found for this basename: GLUCAP,  in the last 168 hours  Recent Results (from the past 240 hour(s))  CULTURE, BLOOD (ROUTINE X 2)     Status: None   Collection Time    09/30/13  2:43 PM      Result Value Ref Range Status   Specimen Description BLOOD ARM RIGHT   Final   Special Requests BOTTLES DRAWN AEROBIC AND ANAEROBIC [facility]   Final   Culture  Setup Time     Final   Value: 10/01/2013 00:43     [facility]formed at [facility]   Culture     Final   Value: GRAM POSITIVE COCCI IN CLUSTERS     Note: Gram Stain Report Called to,Read Back By and Verified With: Windell Momenteanna Dillon RN on 10/02/13 at 01:15 by Christie NottinghamAnne Skeen     Performed at [facility]   Report Status PENDING   Incomplete  CULTURE, BLOOD (ROUTINE X 2)     Status: None   Collection Time    09/30/13  3:06 PM      Result Value Ref Range Status   Specimen Description BLOOD FOREARM RIGHT   Final   Special Requests BOTTLES DRAWN AEROBIC AND ANAEROBIC [facility]   Final   Culture  Setup Time     Final   Value: 10/01/2013 00:43     [facility]formed at [facility]   Culture     Final   Value: STAPHYLOCOCCUS SPECIES (COAGULASE NEGATIVE)     Note: RIFAMPIN AND GENTAMICIN SHOULD NOT BE USED AS SINGLE DRUGS FOR  TREATMENT OF STAPH INFECTIONS.     Note: Gram Stain Report Called to,Read Back By and Verified With: TRACY DAVIS 10/01/13 @ 6:15PM BY RUSCOE A.     [facility]formed at [facility]   Report Status PENDING   Incomplete  URINE CULTURE  Status: None   Collection Time    09/30/13  3:50 PM      Result Value Ref Range Status   Specimen Description URINE, RANDOM   Final   Special Requests NONE   Final   Culture  Setup Time     Final   Value: 09/30/2013 20:38     Performed at [facility] Count     Final   Value: 85,000 COLONIES/ML     Performed at [facility]   Culture     Final   Value: Multiple bacterial morphotypes present, none predominant. Suggest appropriate recollection if clinically indicated.     Performed at [facility]   Report Status 10/01/2013 FINAL   Final  MRSA PCR SCREENING     Status: Abnormal   Collection Time    09/30/13  6:59 PM      Result Value Ref Range Status   MRSA by PCR POSITIVE (*) NEGATIVE Final   Comment:            The GeneXpert MRSA Assay (FDA     approved for NASAL specimens     only), is one component of a     comprehensive MRSA colonization     surveillance program. It is not     intended to diagnose MRSA     infection nor to guide or     monitor treatment for     MRSA infections.     RESULT CALLED TO, READ BACK BY AND VERIFIED WITH:     L SUMMERS RN 2146 09/30/13 A BROWNING  URINE CULTURE     Status: None   Collection Time    09/30/13  7:54 PM      Result Value Ref Range Status   Specimen Description URINE, CATHETERIZED   Final   Special Requests NONE   Final   Culture  Setup Time     Final   Value: 09/30/2013 20:27     Performed at [facility] Count     Final   Value: 70,000 COLONIES/ML     Performed at [facility]   Culture     Final   Value: Multiple bacterial morphotypes present, none predominant. Suggest appropriate recollection if clinically indicated.     Performed at  [facility]   Report Status 10/02/2013 FINAL   Final     Studies:  Recent x-ray studies have been reviewed in detail by the Attending Physician  Scheduled Meds:  Scheduled Meds: . Chlorhexidine Gluconate Cloth  6 each Topical Q0600  . diltiazem  60 mg Oral 3 times per day  . furosemide  20 mg Oral BID  . metoprolol tartrate  12.5 mg Oral 3 times per day  . mupirocin ointment  1 application Nasal BID  . piperacillin-tazobactam (ZOSYN)  IV  3.375 g Intravenous Q8H  . rivaroxaban  20 mg Oral Q supper  . vancomycin  1,500 mg Intravenous Q12H   Continuous Infusions: . amiodarone 30 mg/hr (10/02/13 0919)    Time spent on care of this patient:   Nazareth Norenberg, MD 10/02/2013, 1:25 PM  LOS: 2 days   [facility] Office  [phone] Pager - Text Page per [email or web address]  If 7PM-7AM, please contact night-coverage [email or web address]

## 2013-10-02 NOTE — Progress Notes (Signed)
ANTIBIOTIC CONSULT NOTE - FOLLOW UP  Pharmacy Consult for Vancomycin Indication: GPC bacteremia  No Known Allergies  Patient Measurements: Height: 5' (152.4 cm) Weight: 650 lb (294.838 kg) IBW/kg (Calculated) : 45.5  Vital Signs: Temp: 99.2 F (37.3 C) (08/16 1532) Temp src: Oral (08/16 1532) Intake/Output from previous day: 08/15 0701 - 08/16 0700 In: 2750.4 [P.O.:700; I.V.:300.4; IV Piggyback:1750] Out: 1175 [Urine:1175] Intake/Output from this shift: Total I/O In: -  Out: 350 [Urine:350]  Labs:  Recent Labs  09/30/13 1440 10/02/13 0350  WBC 8.9 23.6*  HGB 11.4* 9.8*  PLT 196 183  CREATININE 0.99 1.20*   Estimated Creatinine Clearance: 137.1 ml/min (by C-G formula based on Cr of 1.2).  Recent Labs  10/02/13 1642  VANCOTROUGH 16.2     Microbiology: Recent Results (from the past 720 hour(s))  CULTURE, BLOOD (ROUTINE X 2)     Status: None   Collection Time    09/30/13  2:43 PM      Result Value Ref Range Status   Specimen Description BLOOD ARM RIGHT   Final   Special Requests BOTTLES DRAWN AEROBIC AND ANAEROBIC [facility]   Final   Culture  Setup Time     Final   Value: 10/01/2013 00:43     Performed at [facility]   Culture     Final   Value: GRAM POSITIVE COCCI IN CLUSTERS     Note: Gram Stain Report Called to,Read Back By and Verified With: Windell Moment RN on 10/02/13 at 01:15 by Christie Nottingham     Performed at [facility]   Report Status PENDING   Incomplete  CULTURE, BLOOD (ROUTINE X 2)     Status: None   Collection Time    09/30/13  3:06 PM      Result Value Ref Range Status   Specimen Description BLOOD FOREARM RIGHT   Final   Special Requests BOTTLES DRAWN AEROBIC AND ANAEROBIC [facility]   Final   Culture  Setup Time     Final   Value: 10/01/2013 00:43     Performed at [facility]   Culture     Final   Value: STAPHYLOCOCCUS SPECIES (COAGULASE NEGATIVE)     Note: RIFAMPIN AND GENTAMICIN SHOULD NOT BE USED AS SINGLE DRUGS FOR  TREATMENT OF STAPH INFECTIONS.     Note: Gram Stain Report Called to,Read Back By and Verified With: TRACY DAVIS 10/01/13 @ 6:15PM BY RUSCOE A.     Performed at [facility]   Report Status PENDING   Incomplete  URINE CULTURE     Status: None   Collection Time    09/30/13  3:50 PM      Result Value Ref Range Status   Specimen Description URINE, RANDOM   Final   Special Requests NONE   Final   Culture  Setup Time     Final   Value: 09/30/2013 20:38     Performed at [facility] Count     Final   Value: 85,000 COLONIES/ML     Performed at [facility]   Culture     Final   Value: Multiple bacterial morphotypes present, none predominant. Suggest appropriate recollection if clinically indicated.     Performed at [facility]   Report Status 10/01/2013 FINAL   Final  MRSA PCR SCREENING     Status: Abnormal   Collection Time    09/30/13  6:59 PM      Result  Value Ref Range Status   MRSA by PCR POSITIVE (*) NEGATIVE Final   Comment:            The GeneXpert MRSA Assay (FDA     approved for NASAL specimens     only), is one component of a     comprehensive MRSA colonization     surveillance program. It is not     intended to diagnose MRSA     infection nor to guide or     monitor treatment for     MRSA infections.     RESULT CALLED TO, READ BACK BY AND VERIFIED WITH:     L SUMMERS RN 2146 09/30/13 A BROWNING  URINE CULTURE     Status: None   Collection Time    09/30/13  7:54 PM      Result Value Ref Range Status   Specimen Description URINE, CATHETERIZED   Final   Special Requests NONE   Final   Culture  Setup Time     Final   Value: 09/30/2013 20:27     [facility]formed at [facility]   Colony Count     Final   Value: 70,000 COLONIES/ML     Performed at [facility]   Culture     Final   Value: Multiple bacterial morphotypes present, none predominant. Suggest appropriate recollection if clinically indicated.     Performed at  [facility]   Report Status 10/02/2013 FINAL   Final    Anti-infectives   Start     Dose/Rate Route Frequency Ordered Stop   10/01/13 0500  vancomycin (VANCOCIN) 1,500 mg in sodium chloride 0.9 % 500 mL IVPB     1,500 mg 250 mL/hr over 120 Minutes Intravenous Every 12 hours 09/30/13 1541     09/30/13 2100  piperacillin-tazobactam (ZOSYN) IVPB 3.375 g     3.375 g 12.5 mL/hr over 240 Minutes Intravenous Every 8 hours 09/30/13 1541     09/30/13 1445  vancomycin (VANCOCIN) 2,000 mg in sodium chloride 0.9 % 500 mL IVPB  Status:  Discontinued     2,000 mg 250 mL/hr over 120 Minutes Intravenous STAT 09/30/13 1440 09/30/13 1441   09/30/13 1445  vancomycin (VANCOCIN) 2,500 mg in sodium chloride 0.9 % 500 mL IVPB     2,500 mg 250 mL/hr over 120 Minutes Intravenous STAT 09/30/13 1441 09/30/13 1745   09/30/13 1430  piperacillin-tazobactam (ZOSYN) IVPB 3.375 g     3.375 g 100 mL/hr over 30 Minutes Intravenous  Once 09/30/13 1429 09/30/13 1545   09/30/13 1430  vancomycin (VANCOCIN) IVPB 1000 mg/200 mL premix  Status:  Discontinued     1,000 mg 200 mL/hr over 60 Minutes Intravenous  Once 09/30/13 1429 09/30/13 1440      Assessment: 43 yo F with GPC bacteremia on day # 3 of IV Vancomycin.  Pt has been dosed based on the obesity nomogram and has a therapeutic Vancomycin trough.  Noted slight increase in SCr 0.99 >> 1.2.  Will need to monitor closely.  Goal of Therapy:  Vancomycin trough level 15-20 mcg/ml  Plan:  Continue Vancomycin 1500 mg IV q12h. Follow up culture data.  Toys 'R' UsKimberly Ilhan Madan, Pharm.D., BCPS Clinical Pharmacist Pager [phone] 10/02/2013 6:12 PM

## 2013-10-03 ENCOUNTER — Inpatient Hospital Stay (HOSPITAL_COMMUNITY): Payer: Medicare Other

## 2013-10-03 DIAGNOSIS — L0291 Cutaneous abscess, unspecified: Secondary | ICD-10-CM

## 2013-10-03 DIAGNOSIS — L02419 Cutaneous abscess of limb, unspecified: Secondary | ICD-10-CM

## 2013-10-03 DIAGNOSIS — L0390 Cellulitis, unspecified: Secondary | ICD-10-CM

## 2013-10-03 DIAGNOSIS — L03119 Cellulitis of unspecified part of limb: Secondary | ICD-10-CM

## 2013-10-03 LAB — CBC
HCT: 33.4 % — ABNORMAL LOW (ref 36.0–46.0)
Hemoglobin: 10.9 g/dL — ABNORMAL LOW (ref 12.0–15.0)
MCH: 29 pg (ref 26.0–34.0)
MCHC: 32.6 g/dL (ref 30.0–36.0)
MCV: 88.8 fL (ref 78.0–100.0)
PLATELETS: 177 10*3/uL (ref 150–400)
RBC: 3.76 MIL/uL — AB (ref 3.87–5.11)
RDW: 17.3 % — AB (ref 11.5–15.5)
WBC: 23.4 10*3/uL — ABNORMAL HIGH (ref 4.0–10.5)

## 2013-10-03 LAB — BASIC METABOLIC PANEL
Anion gap: 11 (ref 5–15)
BUN: 21 mg/dL (ref 6–23)
CALCIUM: 8.3 mg/dL — AB (ref 8.4–10.5)
CO2: 26 mEq/L (ref 19–32)
Chloride: 102 mEq/L (ref 96–112)
Creatinine, Ser: 1.16 mg/dL — ABNORMAL HIGH (ref 0.50–1.10)
GFR, EST AFRICAN AMERICAN: 65 mL/min — AB (ref >90)
GFR, EST NON AFRICAN AMERICAN: 56 mL/min — AB (ref >90)
Glucose, Bld: 92 mg/dL (ref 70–99)
POTASSIUM: 4.3 meq/L (ref 3.7–5.3)
Sodium: 139 mEq/L (ref 137–147)

## 2013-10-03 LAB — CULTURE, BLOOD (ROUTINE X 2)

## 2013-10-03 MED ORDER — AMIODARONE HCL 200 MG PO TABS
200.0000 mg | ORAL_TABLET | Freq: Two times a day (BID) | ORAL | Status: DC
  Administered 2013-10-03 – 2013-10-04 (×3): 200 mg via ORAL
  Filled 2013-10-03 (×4): qty 1

## 2013-10-03 MED ORDER — CEFAZOLIN SODIUM-DEXTROSE 2-3 GM-% IV SOLR
2.0000 g | Freq: Three times a day (TID) | INTRAVENOUS | Status: DC
  Administered 2013-10-03 – 2013-10-04 (×3): 2 g via INTRAVENOUS
  Filled 2013-10-03 (×5): qty 50

## 2013-10-03 NOTE — Consult Note (Signed)
WOC wound follow up Contacted for follow up evaluation secondary to large amount of drainage from the LLE wounds. Pt known to this WOC from previous admission however at that time she only had one small draining area. She has new areas on the LLE that affect the posterior thigh and medial calf. These are partial thickness ruptured bulla. She still has one large intact bulla on the medial malleolar area.  She reports someone at the SNF was doing some treatments for lymphadema but the wraps had not arrived to the facility yet, this person is not a certified therapist but has provided some care for this problem in the past. They had just began to treat her with manual massage.  Wound type: partial thickness skin ulcerations related to edema and cellulitis of the LLE.  Measurement: it is very difficult to assess all of her wounds due to the size and location.  Attempted to visualize the wounds today with the assistance of 4 other staff members to turn the patient.  Wound bed:  All of the wounds are clean, with serous HEAVY drainage.  Drainage (amount, consistency, odor) serous, actively leaking from the leg as we turned the patient and saturated the dressings underpads within a few minutes of turning her.  Periwound:macerated but seems to be improving.  Dressing procedure/placement/frequency: I have redressed 5 of the 6 areas with silver hydrofiber today, the wounds had just been redressed at 4am and the hydrofiber was completely overcome with drainage at 830am except for one site.  I have discontinued the use of the antimicrobial wicking fabric in the skin folds of the LLE due to the amount of drainage from the wounds which are overcoming the fabric if it gets close to the wounds.  Feel that this patient really needs treatment for the lymphedema however she has transportation issues and can not be treated as an outpatient.  She inquires with me today about LTAC and lymphedema tx.  I am not sure if there are  any of the local LTAC that provide that treatment.    I have requested the bedside staff monitor the erythema in the LLE for progression.   Very difficult situation to manage, once all the bulla have opened and drained the areas may improve.    Discussed POC with patient and bedside nurse.  Re consult if needed, will not follow at this time. Thanks  Aerica Rincon [employer] RN, CWOCN [phone])

## 2013-10-03 NOTE — Progress Notes (Signed)
Pt is able to place on CPAP mask when ready. Pt encouraged to call RT if needing any assistance.  

## 2013-10-03 NOTE — Consult Note (Addendum)
[facility]     Reason for Consult: CoNS bacteremia/cellulitis/abscess    Referring Physician: Dr. Butler Denmark  Principal Problem:   Sepsis Active Problems:   Morbid obesity   Diastolic CHF   Cellulitis   Atrial fibrillation with RVR   Edema   Hypotension   . amiodarone  200 mg Oral BID  . Chlorhexidine Gluconate Cloth  6 each Topical Q0600  . diclofenac sodium  2 g Topical QID  . diltiazem  60 mg Oral 3 times per day  . furosemide  20 mg Oral BID  . metoprolol tartrate  12.5 mg Oral 3 times per day  . mupirocin ointment  1 application Nasal BID  . rivaroxaban  20 mg Oral Q supper    Recommendations: Cefazolin for CoNS, oxacillin sensitive Consider surgery consult for evaluation of area I suspect needs drainage  Assessment: She has cellulitis, wounds, and on the upper leg wounds of left leg is firm concerning for abscess collection.  Ultrasound has been ordered but with the increased WBC, presentation, I suspect abscess there.    Antibiotics: Vancomycin and zosyn  HPI: Michele Weber is a 43 y.o. female with extreme morbid obesity with wieght of 648 lbs, BMI over 100, who presented 8/14 with left leg pain, tachycardia, fever from nursing home.  She was noted to have multiple wounds on her left leg and drainage. Blood cultures have grown out CoNS, oxacillin sensitive, 2/2.  Wound culture is pending.  WBC has increased to 23,000 despite antibiotics.     Review of Systems: A comprehensive review of systems was negative.  Past Medical History  Diagnosis Date  . Morbid obesity   . Lymphedema of both lower extremities   . Diastolic CHF   . Vitamin D deficiency     History  Substance Use Topics  . Smoking status: Never Smoker   . Smokeless tobacco: Not on file  . Alcohol Use: No    Family History  Problem Relation Age of Onset  . Diabetes Father    No Known Allergies  OBJECTIVE: Blood pressure 109/63, pulse 91, temperature 99.3 F  (37.4 C), temperature source Oral, resp. rate 22, height 5' (1.524 m), weight 648 lb (293.931 kg), last menstrual period 07/31/2013, SpO2 97.00%. General: awake, alert, nad Skin: no rashes Lungs: CTA Cor: RRR Abdomen: obese Ext: left leg with lower area with weaping wounds, upper portion with wound and firm area.    Microbiology: Recent Results (from the past 240 hour(s))  CULTURE, BLOOD (ROUTINE X 2)     Status: None   Collection Time    09/30/13  2:43 PM      Result Value Ref Range Status   Specimen Description BLOOD ARM RIGHT   Final   Special Requests BOTTLES DRAWN AEROBIC AND ANAEROBIC [facility]   Final   Culture  Setup Time     Final   Value: 10/01/2013 00:43     Performed at [facility]   Culture     Final   Value: STAPHYLOCOCCUS SPECIES (COAGULASE NEGATIVE)     Note: THE SIGNIFICANCE OF ISOLATING THIS ORGANISM FROM A SINGLE SET OF BLOOD CULTURES WHEN MULTIPLE SETS ARE DRAWN IS UNCERTAIN. PLEASE NOTIFY THE MICROBIOLOGY DEPARTMENT WITHIN ONE WEEK IF SPECIATION AND SENSITIVITIES ARE REQUIRED.     Note: Gram Stain Report Called to,Read Back By and Verified With: Windell Moment RN on 10/02/13 at 01:15 by Christie Nottingham     Performed at [facility]  Report Status 10/03/2013 FINAL   Final  CULTURE, BLOOD (ROUTINE X 2)     Status: None   Collection Time    09/30/13  3:06 PM      Result Value Ref Range Status   Specimen Description BLOOD FOREARM RIGHT   Final   Special Requests BOTTLES DRAWN AEROBIC AND ANAEROBIC [facility]   Final   Culture  Setup Time     Final   Value: 10/01/2013 00:43     Performed at [facility]   Culture     Final   Value: STAPHYLOCOCCUS SPECIES (COAGULASE NEGATIVE)     Note: RIFAMPIN AND GENTAMICIN SHOULD NOT BE USED AS SINGLE DRUGS FOR TREATMENT OF STAPH INFECTIONS. This organism DOES NOT demonstrate inducible Clindamycin resistance in vitro.     Note: Gram Stain Report Called to,Read Back By and Verified With: TRACY DAVIS 10/01/13 @ 6:15PM BY  RUSCOE A.     Performed at [facility]   Report Status 10/03/2013 FINAL   Final   Organism ID, Bacteria STAPHYLOCOCCUS SPECIES (COAGULASE NEGATIVE)   Final  URINE CULTURE     Status: None   Collection Time    09/30/13  3:50 PM      Result Value Ref Range Status   Specimen Description URINE, RANDOM   Final   Special Requests NONE   Final   Culture  Setup Time     Final   Value: 09/30/2013 20:38     Performed at [facility] Count     Final   Value: 85,000 COLONIES/ML     Performed at [facility]   Culture     Final   Value: Multiple bacterial morphotypes present, none predominant. Suggest appropriate recollection if clinically indicated.     Performed at [facility]   Report Status 10/01/2013 FINAL   Final  MRSA PCR SCREENING     Status: Abnormal   Collection Time    09/30/13  6:59 PM      Result Value Ref Range Status   MRSA by PCR POSITIVE (*) NEGATIVE Final   Comment:            The GeneXpert MRSA Assay (FDA     approved for NASAL specimens     only), is one component of a     comprehensive MRSA colonization     surveillance program. It is not     intended to diagnose MRSA     infection nor to guide or     monitor treatment for     MRSA infections.     RESULT CALLED TO, READ BACK BY AND VERIFIED WITH:     L SUMMERS RN 2146 09/30/13 A BROWNING  URINE CULTURE     Status: None   Collection Time    09/30/13  7:54 PM      Result Value Ref Range Status   Specimen Description URINE, CATHETERIZED   Final   Special Requests NONE   Final   Culture  Setup Time     Final   Value: 09/30/2013 20:27     Performed at [facility] Count     Final   Value: 70,000 COLONIES/ML     Performed at [facility]   Culture     Final   Value: Multiple bacterial morphotypes present, none predominant. Suggest appropriate recollection if clinically indicated.     Performed at [facility]   Report  Status 10/02/2013  FINAL   Final  WOUND CULTURE     Status: None   Collection Time    10/02/13  1:55 PM      Result Value Ref Range Status   Specimen Description WOUND RIGHT LEG   Final   Special Requests Normal   Final   Gram Stain     Final   Value: NO WBC SEEN     NO SQUAMOUS EPITHELIAL CELLS SEEN     RARE GRAM POSITIVE COCCI     IN PAIRS IN CLUSTERS     [facility]   Culture     Final   Value: Culture reincubated for better growth     [facility]formed at [facility]   Report Status PENDING   Incomplete    Nashly Olsson, Molly MaduroOBERT, MD [facility] [email or web address] [phone] pager  [phone] cell 10/03/2013, 5:05 PM

## 2013-10-03 NOTE — Care Management Note (Signed)
    Page 1 of 2   10/04/2013     3:04:11 PM CARE MANAGEMENT NOTE 10/04/2013  Patient:  Michele Weber,Michele Weber   Account Number:  [id number]  Date Initiated:  10/03/2013  Documentation initiated by:  Donn PieriniWEBSTER,Navaya Wiatrek  Subjective/Objective Assessment:   pt admitted with sepsis from left leg cellulitis and for a-fib with RVR. She is found to have gr pos cocci on the blood cultures.     Action/Plan:   [facility]- CSW consulted-   Anticipated DC Date:  10/04/2013   Anticipated DC Plan:  LONG TERM ACUTE CARE (LTAC)  In-house referral  Clinical Social Worker      DC Planning Services  CM consult      [facility]  [facility]   Choice offered to / List presented to:  C-1 Patient           Status of service:  Completed, signed off Medicare Important Message given?  YES (If response is "NO", the following Medicare IM given date fields will be blank) Date Medicare IM given:  10/03/2013 Medicare IM given by:  Donn PieriniWEBSTER,Marea Reasner Date Additional Medicare IM given:   Additional Medicare IM given by:    Discharge Disposition:  LONG TERM ACUTE CARE (LTAC)  Per UR Regulation:  Reviewed for med. necessity/level of care/duration of stay  If discussed at Long Length of Stay Meetings, dates discussed:    Comments:  10/04/13- 0930- Donn PieriniKristi Jodeci Rini RN, BSN [phone] Heard from [facility] rep-Kimberly S.- [facility] is offering bed- and has one available today- MD notified of bed offer and availability for today- will let this CM know if pt ready for d/c to LTAC when she rounds on pt- spoke with pt at bedside and explained that [facility] did not make a bed offer however [facility] had- pt agreeable to take bed at [facility]- 1500- pt ready for d/c to [facility] today per MD- Cala BradfordKimberly with [facility] aware and working on transfer- d/c summary in chart- cobra form signed- awaiting bed assignment from [facility]- pt will be transported via ambulance to [facility] later today-   10/03/13- 1140- Donn PieriniKristi Jacoria Keiffer RN, BSN  [phone] Spoke with pt at bedside regarding LTACH referral- per pt [facility], she is agreeable to [facility] to see if she might be [facility] at this [facility]. Have called referral to both [facility] reps to have a look at pt- NCM to f/u as LTACH referral process goes forward  update 10/03/13- 1558-  heard back from [facility] rep-Jenny- at this time [facility] is unable to offer a bed and has declined pt-still awaiting to hear from [facility] rep.

## 2013-10-03 NOTE — Progress Notes (Signed)
[facility] Progress Note   Michele Weber NWG:[id number] DOB: 07/14/1970 DOA: 09/30/2013 PCP: Karlene Einstein, MD  Brief narrative: Michele Weber is a 43 y.o. female admitted for sepsis from left leg cellulitis and for a-fib with RVR. She is found to have coag neg staph on the blood cultures.    Subjective: No complaints. We have discussed starting her on a strict Atkins diet and she is in agreement with this. Have discussed her wounds and the option for LTAC to help manage her lymphedema and she is in agreement with this as well.   Assessment/Plan: Principal Problem:   Sepsis- gr pos cocci bacteremia and extensive left leg cellulitis - has had recurrent infections of the left leg - coag neg staph on blood cultures - cont Vanc and Zosyn for now and follow- WBC count still significantly elevated  Active Problems:   Atrial fibrillation with RVR - started on Amiodarone as BP quite low holding other rate controlling meds - BP has improved with treatment of sepsis and with hydration.  - Cardizem and Metoprolol resumed with noted improvement in rate which in now in 80s - cont Xarelto for stroke prevention    Diastolic CHF - compensated- resumed low dose Lasix today to prevent fluid overload- will increase dose today while watching Creatinine    Severe lymph edema - b/l legs- severe blistering and weeping of legs large areas of skin sloughing - WOC has evaluated her- will be trying to have her transitioned to an LTAC for further wound care - will obtain ultrasound of the wound on her left thigh which appears to have pus - have already obtained a culture    Hypotension - cont to treat infection and follow- stoped IVF to prevent fluid overload    Morbid obesity   Code Status: full code Family Communication: none Disposition Plan: follow in SDU- return to [facility] when stable DVT prophylaxis:  Xarelto Consultants: Cardiology Procedures: none  Antibiotics: Anti-infectives   Start     Dose/Rate Route Frequency Ordered Stop   10/01/13 0500  vancomycin (VANCOCIN) 1,500 mg in sodium chloride 0.9 % 500 mL IVPB     1,500 mg 250 mL/hr over 120 Minutes Intravenous Every 12 hours 09/30/13 1541     09/30/13 2100  piperacillin-tazobactam (ZOSYN) IVPB 3.375 g     3.375 g 12.5 mL/hr over 240 Minutes Intravenous Every 8 hours 09/30/13 1541     09/30/13 1445  vancomycin (VANCOCIN) 2,000 mg in sodium chloride 0.9 % 500 mL IVPB  Status:  Discontinued     2,000 mg 250 mL/hr over 120 Minutes Intravenous STAT 09/30/13 1440 09/30/13 1441   09/30/13 1445  vancomycin (VANCOCIN) 2,500 mg in sodium chloride 0.9 % 500 mL IVPB     2,500 mg 250 mL/hr over 120 Minutes Intravenous STAT 09/30/13 1441 10/03/13 0916   09/30/13 1430  piperacillin-tazobactam (ZOSYN) IVPB 3.375 g     3.375 g 100 mL/hr over 30 Minutes Intravenous  Once 09/30/13 1429 09/30/13 1545   09/30/13 1430  vancomycin (VANCOCIN) IVPB 1000 mg/200 mL premix  Status:  Discontinued     1,000 mg 200 mL/hr over 60 Minutes Intravenous  Once 09/30/13 1429 09/30/13 1440         Objective: Filed Weights   09/30/13 1800 10/02/13 1900  Weight: 294.838 kg (650 lb) 293.931 kg (648 lb)    Intake/Output Summary (Last 24 hours) at 10/03/13 1508 Last data filed at 10/03/13 1304  Gross per 24 hour  Intake 2467.3 ml  Output   1274 ml  Net 1193.3 ml     Vitals Filed Vitals:   10/03/13 0630 10/03/13 0700 10/03/13 1215 10/03/13 1231  BP: 93/60  109/63   Pulse: 86  91   Temp:  98.4 F (36.9 C)  98.3 F (36.8 C)  TempSrc:  Oral  Oral  Resp: 20  22   Height:      Weight:      SpO2: 95%  97%     Exam: General: No acute respiratory distress- morbidly obese Lungs: Clear to auscultation bilaterally without wheezes or crackles Cardiovascular: Regular rate and rhythm without murmur gallop or rub normal S1 and S2 Abdomen: Nontender,  nondistended, soft, bowel sounds positive, no rebound, no ascites, no appreciable mass Extremities: No significant cyanosis, clubbing,+  edema bilateral lower extremities with cellulitis of left leg and numerous draining wounds on left thigh- clear yellow liquid is draining- wound on right upper thigh is small but appears more deep - I am able to express thick tan/yellow material from this wound   Data Reviewed: Basic Metabolic Panel:  Recent Labs Lab 09/30/13 1440 10/02/13 0350 10/03/13 1205  NA 138 135* 139  K 4.4 3.9 4.3  CL 99 99 102  CO2 24 27 26   GLUCOSE 95 94 92  BUN 20 21 21   CREATININE 0.99 1.20* 1.16*  CALCIUM 8.2* 8.1* 8.3*   Liver Function Tests:  Recent Labs Lab 09/30/13 1440  AST 8  ALT <5  ALKPHOS 41  BILITOT 1.0  PROT 7.1  ALBUMIN 2.4*   No results found for this basename: LIPASE, AMYLASE,  in the last 168 hours No results found for this basename: AMMONIA,  in the last 168 hours CBC:  Recent Labs Lab 09/30/13 1440 10/02/13 0350 10/03/13 1205  WBC 8.9 23.6* 23.4*  NEUTROABS 8.3*  --   --   HGB 11.4* 9.8* 10.9*  HCT 35.5* 31.2* 33.4*  MCV 87.2 87.4 88.8  PLT 196 183 177   Cardiac Enzymes:  Recent Labs Lab 09/30/13 1440 09/30/13 2054 10/01/13 0305  TROPONINI <0.30 <0.30 <0.30   BNP (last 3 results) No results found for this basename: PROBNP,  in the last 8760 hours CBG: No results found for this basename: GLUCAP,  in the last 168 hours  Recent Results (from the past 240 hour(s))  CULTURE, BLOOD (ROUTINE X 2)     Status: None   Collection Time    09/30/13  2:43 PM      Result Value Ref Range Status   Specimen Description BLOOD ARM RIGHT   Final   Special Requests BOTTLES DRAWN AEROBIC AND ANAEROBIC [facility]   Final   Culture  Setup Time     Final   Value: 10/01/2013 00:43     Performed at [facility]   Culture     Final   Value: STAPHYLOCOCCUS SPECIES (COAGULASE NEGATIVE)     Note: THE SIGNIFICANCE OF ISOLATING THIS ORGANISM  FROM A SINGLE SET OF BLOOD CULTURES WHEN MULTIPLE SETS ARE DRAWN IS UNCERTAIN. PLEASE NOTIFY THE MICROBIOLOGY DEPARTMENT WITHIN ONE WEEK IF SPECIATION AND SENSITIVITIES ARE REQUIRED.     Note: Gram Stain Report Called to,Read Back By and Verified With: Windell Moment RN on 10/02/13 at 01:15 by Christie Nottingham     Performed at [facility]   Report Status 10/03/2013 FINAL   Final  CULTURE, BLOOD (ROUTINE X 2)     Status: None   Collection Time    09/30/13  3:06  PM      Result Value Ref Range Status   Specimen Description BLOOD FOREARM RIGHT   Final   Special Requests BOTTLES DRAWN AEROBIC AND ANAEROBIC [facility]   Final   Culture  Setup Time     Final   Value: 10/01/2013 00:43     Performed at [facility]   Culture     Final   Value: STAPHYLOCOCCUS SPECIES (COAGULASE NEGATIVE)     Note: RIFAMPIN AND GENTAMICIN SHOULD NOT BE USED AS SINGLE DRUGS FOR TREATMENT OF STAPH INFECTIONS. This organism DOES NOT demonstrate inducible Clindamycin resistance in vitro.     Note: Gram Stain Report Called to,Read Back By and Verified With: TRACY DAVIS 10/01/13 @ 6:15PM BY RUSCOE A.     Performed at [facility]   Report Status 10/03/2013 FINAL   Final   Organism ID, Bacteria STAPHYLOCOCCUS SPECIES (COAGULASE NEGATIVE)   Final  URINE CULTURE     Status: None   Collection Time    09/30/13  3:50 PM      Result Value Ref Range Status   Specimen Description URINE, RANDOM   Final   Special Requests NONE   Final   Culture  Setup Time     Final   Value: 09/30/2013 20:38     Performed at [facility]   Colony Count     Final   Value: 85,000 COLONIES/ML     Performed at [facility]   Culture     Final   Value: Multiple bacterial morphotypes present, none predominant. Suggest appropriate recollection if clinically indicated.     Performed at [facility]   Report Status 10/01/2013 FINAL   Final  MRSA PCR SCREENING     Status: Abnormal   Collection Time    09/30/13  6:59  PM      Result Value Ref Range Status   MRSA by PCR POSITIVE (*) NEGATIVE Final   Comment:            The GeneXpert MRSA Assay (FDA     approved for NASAL specimens     only), is one component of a     comprehensive MRSA colonization     surveillance program. It is not     intended to diagnose MRSA     infection nor to guide or     monitor treatment for     MRSA infections.     RESULT CALLED TO, READ BACK BY AND VERIFIED WITH:     L SUMMERS RN [id number] 09/30/13 A BROWNING  URINE CULTURE     Status: None   Collection Time    09/30/13  7:54 PM      Result Value Ref Range Status   Specimen Description URINE, CATHETERIZED   Final   Special Requests NONE   Final   Culture  Setup Time     Final   Value: 09/30/2013 20:27     Performed at [facility]   Colony Count     Final   Value: 70,000 COLONIES/ML     Performed at [facility]   Culture     Final   Value: Multiple bacterial morphotypes present, none predominant. Suggest appropriate recollection if clinically indicated.     Performed at [facility]   Report Status 10/02/2013 FINAL   Final  WOUND CULTURE     Status: None   Collection Time    10/02/13  1:55 PM  Result Value Ref Range Status   Specimen Description WOUND RIGHT LEG   Final   Special Requests Normal   Final   Gram Stain     Final   Value: NO WBC SEEN     NO SQUAMOUS EPITHELIAL CELLS SEEN     RARE GRAM POSITIVE COCCI     IN PAIRS IN CLUSTERS     Performed at [facility]   Culture     Final   Value: Culture reincubated for better growth     Performed at [facility]   Report Status PENDING   Incomplete     Studies:  Recent x-ray studies have been reviewed in detail by the Attending Physician  Scheduled Meds:  Scheduled Meds: . amiodarone  200 mg Oral BID  . Chlorhexidine Gluconate Cloth  6 each Topical Q0600  . diclofenac sodium  2 g Topical QID  . diltiazem  60 mg Oral 3 times per day  . furosemide  20 mg Oral  BID  . metoprolol tartrate  12.5 mg Oral 3 times per day  . mupirocin ointment  1 application Nasal BID  . piperacillin-tazobactam (ZOSYN)  IV  3.375 g Intravenous Q8H  . rivaroxaban  20 mg Oral Q supper  . vancomycin  1,500 mg Intravenous Q12H   Continuous Infusions:    Time spent on care of this patient:   Calvert Cantor, MD 10/03/2013, 3:08 PM  LOS: 3 days   [facility] Office  [phone] Pager - Text Page per [email or web address]  If 7PM-7AM, please contact night-coverage [email or web address]

## 2013-10-03 NOTE — Progress Notes (Signed)
Occupational Therapy Evaluation Patient Details Name: Michele Weber MRN: [id number] DOB: 1970/07/27 Today's Date: 10/03/2013    History of Present Illness 43 yo F admitted with sepsis from LLE cellulitis. Pt also with a-fib, severe LE lymphedema and morbid obesity.     Clinical Impression   PTA, pt was at [facility], then [facility] for rehab since March of 2015. Prior to March, pt lived at home with her sister and was mod I with ADL and mobility. Pt will need rehab at SNF. Pt motivated to begin bed level ex program and begin participating more with her ADL. Pt will need bariatric chair from portable equipment and staff can slide pt from bed to chair using maxislides. Pt will benefit from skilled OT services to facilitate D/C to next venue due to below deficits. Will begin T band ex program.     Follow Up Recommendations  SNF;Supervision/Assistance - 24 hour;LTACH    Equipment Recommendations    bariatric recliner   Recommendations for Other Services       Precautions / Restrictions Precautions Precautions: Fall      Mobility Bed Mobility Overal bed mobility: Needs Assistance Bed Mobility: Rolling Rolling: Total assist         General bed mobility comments: +4 required for rolling side - side to clean pt after BM and to change BLE dressings  Transfers                 General transfer comment: not assessed    Balance                                            ADL Overall ADL's : Needs assistance/impaired     Grooming: Set up   Upper Body Bathing: Set up;Bed level   Lower Body Bathing: Total assistance;Bed level                       Functional mobility during ADLs:  (not assessed. Bed level eval only) General ADL Comments: /discussed PLOF and possible use of AE to assist with LB ADL.     Vision                     Perception     Praxis      Pertinent Vitals/Pain Pain Assessment: 0-10 Pain Score: 3  Pain  Location: L shoulder Pain Descriptors / Indicators: Aching Pain Intervention(s): Limited activity within patient's tolerance     Hand Dominance Right   Extremity/Trunk Assessment Upper Extremity Assessment Upper Extremity Assessment: Generalized weakness (AROM generally WFL. strength @ 3+/5 throughout. c/o shoulder)   Lower Extremity Assessment Lower Extremity Assessment: Defer to PT evaluation   Cervical / Trunk Assessment Cervical / Trunk Assessment: Other exceptions (bed level eval)   Communication Communication Communication: No difficulties   Cognition Arousal/Alertness: Awake/alert Behavior During Therapy: WFL for tasks assessed/performed Overall Cognitive Status: Within Functional Limits for tasks assessed                     General Comments       Exercises       Shoulder Instructions      Home Living Family/patient expects to be discharged to:: Skilled nursing facility  Prior Functioning/Environment Level of Independence: Needs assistance  Gait / Transfers Assistance Needed: Able to take a few steps forward and backwards with PT ADL's / Homemaking Assistance Needed: Pt states she was set up @ bed level for bating and staff assisted with her backside.        OT Diagnosis: Generalized weakness;Acute pain   OT Problem List: Decreased strength;Decreased activity tolerance;Decreased knowledge of use of DME or AE;Cardiopulmonary status limiting activity;Obesity;Pain;Increased edema   OT Treatment/Interventions: Self-care/ADL training;Therapeutic exercise;DME and/or AE instruction;Therapeutic activities;Patient/family education;Balance training    OT Goals(Current goals can be found in the care plan section) Acute Rehab OT Goals Patient Stated Goal: to get stronger and go home again OT Goal Formulation: With patient Time For Goal Achievement: 10/17/13 Potential to Achieve Goals: Good  OT  Frequency: Min 2X/week   Barriers to D/C:            Co-evaluation              End of Session Nurse Communication: Mobility status  Activity Tolerance: Patient tolerated treatment well Patient left: in bed;with call bell/phone within reach   Time: 1340-1400 OT Time Calculation (min): 20 min Charges:  OT General Charges $OT Visit: 1 Procedure OT Evaluation $Initial OT Evaluation Tier I: 1 Procedure OT Treatments $Self Care/Home Management : 8-22 mins G-Codes:    Alfonza Toft,HILLARY 10/03/2013, 4:03 PM   [facility] Quashawn Jewkes, OTR/L  [phone] 10/03/2013

## 2013-10-03 NOTE — Progress Notes (Signed)
Subjective:  Patient denies any chest pain or shortness of breath. Remains in A. fib with controlled ventricular response  Objective:  Vital Signs in the last 24 hours: Temp:  [98.4 F (36.9 C)-99.8 F (37.7 C)] 98.4 F (36.9 C) (08/17 0700) Pulse Rate:  [86-109] 86 (08/17 0630) Resp:  [19-30] 20 (08/17 0630) BP: (92-114)/(51-67) 93/60 mmHg (08/17 0630) SpO2:  [95 %-100 %] 95 % (08/17 0630) Weight:  [293.931 kg (648 lb)] 293.931 kg (648 lb) (08/16 1900)  Intake/Output from previous day: 08/16 0701 - 08/17 0700 In: 2717.1 [P.O.:1350; I.V.:217.1; IV Piggyback:1150] Out: 1224 [Urine:1224] Intake/Output from this shift: Total I/O In: 33.4 [I.V.:33.4] Out: -   Physical Exam: Neck: no adenopathy, no carotid bruit, no JVD and supple, symmetrical, trachea midline Lungs: clear to auscultation bilaterally Heart: Irregularly irregular S1 and S2 soft Abdomen: Soft massively obese nontender Extremities: No clubbing cyanosis massive bilateral lymphedema with open ulcers  Lab Results:  Recent Labs  09/30/13 1440 10/02/13 0350  WBC 8.9 23.6*  HGB 11.4* 9.8*  PLT 196 183    Recent Labs  09/30/13 1440 10/02/13 0350  NA 138 135*  K 4.4 3.9  CL 99 99  CO2 24 27  GLUCOSE 95 94  BUN 20 21  CREATININE 0.99 1.20*    Recent Labs  09/30/13 2054 10/01/13 0305  TROPONINI <0.30 <0.30   Hepatic Function Panel  Recent Labs  09/30/13 1440  PROT 7.1  ALBUMIN 2.4*  AST 8  ALT <5  ALKPHOS 41  BILITOT 1.0   No results found for this basename: CHOL,  in the last 72 hours No results found for this basename: PROTIME,  in the last 72 hours  Imaging: Imaging results have been reviewed and No results found.  Cardiac Studies:  Assessment/Plan:  Chronic atrial fibrillation Cellulitis of legs with open ulcers Massive chronic bilateral lymphedema  Obstructive sleep apnea on CPAP  Morbidly obese  Compensated and congestive heart failure secondary to preserved LV systolic  function  Vitamin D deficiency Plan Change IV amiodarone to by mouth as per orders  LOS: 3 days    Jihan Rudy N 10/03/2013, 10:38 AM

## 2013-10-03 NOTE — Progress Notes (Signed)
ANTIBIOTIC CONSULT NOTE - FOLLOW UP  Pharmacy Consult for cefazolin Indication: CoNS bacteremia  No Known Allergies  Patient Measurements: Height: 5' (152.4 cm) Weight:  (unable to obtain weight fro bed scale) IBW/kg (Calculated) : 45.5  Vital Signs: Temp: 99.3 F (37.4 C) (08/17 1604) Temp src: Oral (08/17 1604) BP: 109/63 mmHg (08/17 1215) Pulse Rate: 91 (08/17 1215) Intake/Output from previous day: 08/16 0701 - 08/17 0700 In: 2717.1 [P.O.:1350; I.V.:217.1; IV Piggyback:1150] Out: 1224 [Urine:1224] Intake/Output from this shift: Total I/O In: 150.2 [I.V.:100.2; IV Piggyback:50] Out: 500 [Urine:500]  Labs:  Recent Labs  10/02/13 0350 10/03/13 1205  WBC 23.6* 23.4*  HGB 9.8* 10.9*  PLT 183 177  CREATININE 1.20* 1.16*   Estimated Creatinine Clearance: 141.6 ml/min (by C-G formula based on Cr of 1.16).  Recent Labs  10/02/13 1642  VANCOTROUGH 16.2     Microbiology: Recent Results (from the past 720 hour(s))  CULTURE, BLOOD (ROUTINE X 2)     Status: None   Collection Time    09/30/13  2:43 PM      Result Value Ref Range Status   Specimen Description BLOOD ARM RIGHT   Final   Special Requests BOTTLES DRAWN AEROBIC AND ANAEROBIC [facility]   Final   Culture  Setup Time     Final   Value: 10/01/2013 00:43     Performed at [facility]   Culture     Final   Value: STAPHYLOCOCCUS SPECIES (COAGULASE NEGATIVE)     Note: THE SIGNIFICANCE OF ISOLATING THIS ORGANISM FROM A SINGLE SET OF BLOOD CULTURES WHEN MULTIPLE SETS ARE DRAWN IS UNCERTAIN. PLEASE NOTIFY THE MICROBIOLOGY DEPARTMENT WITHIN ONE WEEK IF SPECIATION AND SENSITIVITIES ARE REQUIRED.     Note: Gram Stain Report Called to,Read Back By and Verified With: Windell Moment RN on 10/02/13 at 01:15 by Christie Nottingham     Performed at [facility]   Report Status 10/03/2013 FINAL   Final  CULTURE, BLOOD (ROUTINE X 2)     Status: None   Collection Time    09/30/13  3:06 PM      Result Value Ref Range Status   Specimen Description BLOOD FOREARM RIGHT   Final   Special Requests BOTTLES DRAWN AEROBIC AND ANAEROBIC [facility]   Final   Culture  Setup Time     Final   Value: 10/01/2013 00:43     Performed at [facility]   Culture     Final   Value: STAPHYLOCOCCUS SPECIES (COAGULASE NEGATIVE)     Note: RIFAMPIN AND GENTAMICIN SHOULD NOT BE USED AS SINGLE DRUGS FOR TREATMENT OF STAPH INFECTIONS. This organism DOES NOT demonstrate inducible Clindamycin resistance in vitro.     Note: Gram Stain Report Called to,Read Back By and Verified With: TRACY DAVIS 10/01/13 @ 6:15PM BY RUSCOE A.     Performed at [facility]   Report Status 10/03/2013 FINAL   Final   Organism ID, Bacteria STAPHYLOCOCCUS SPECIES (COAGULASE NEGATIVE)   Final  URINE CULTURE     Status: None   Collection Time    09/30/13  3:50 PM      Result Value Ref Range Status   Specimen Description URINE, RANDOM   Final   Special Requests NONE   Final   Culture  Setup Time     Final   Value: 09/30/2013 20:38     Performed at [facility] Count     Final   Value: 85,000 COLONIES/ML  Performed at [facility]   Culture     Final   Value: Multiple bacterial morphotypes present, none predominant. Suggest appropriate recollection if clinically indicated.     Performed at [facility]   Report Status 10/01/2013 FINAL   Final  MRSA PCR SCREENING     Status: Abnormal   Collection Time    09/30/13  6:59 PM      Result Value Ref Range Status   MRSA by PCR POSITIVE (*) NEGATIVE Final   Comment:            The GeneXpert MRSA Assay (FDA     approved for NASAL specimens     only), is one component of a     comprehensive MRSA colonization     surveillance program. It is not     intended to diagnose MRSA     infection nor to guide or     monitor treatment for     MRSA infections.     RESULT CALLED TO, READ BACK BY AND VERIFIED WITH:     L SUMMERS RN 2146 09/30/13 A BROWNING  URINE CULTURE     Status:  None   Collection Time    09/30/13  7:54 PM      Result Value Ref Range Status   Specimen Description URINE, CATHETERIZED   Final   Special Requests NONE   Final   Culture  Setup Time     Final   Value: 09/30/2013 20:27     Performed at [facility]   Colony Count     Final   Value: 70,000 COLONIES/ML     Performed at [facility]   Culture     Final   Value: Multiple bacterial morphotypes present, none predominant. Suggest appropriate recollection if clinically indicated.     Performed at [facility]   Report Status 10/02/2013 FINAL   Final  WOUND CULTURE     Status: None   Collection Time    10/02/13  1:55 PM      Result Value Ref Range Status   Specimen Description WOUND RIGHT LEG   Final   Special Requests Normal   Final   Gram Stain     Final   Value: NO WBC SEEN     NO SQUAMOUS EPITHELIAL CELLS SEEN     RARE GRAM POSITIVE COCCI     IN PAIRS IN CLUSTERS     Performed at [facility]   Culture     Final   Value: Culture reincubated for better growth     Performed at [facility]   Report Status PENDING   Incomplete    Anti-infectives   Start     Dose/Rate Route Frequency Ordered Stop   10/01/13 0500  vancomycin (VANCOCIN) 1,500 mg in sodium chloride 0.9 % 500 mL IVPB  Status:  Discontinued     1,500 mg 250 mL/hr over 120 Minutes Intravenous Every 12 hours 09/30/13 1541 10/03/13 1703   09/30/13 2100  piperacillin-tazobactam (ZOSYN) IVPB 3.375 g  Status:  Discontinued     3.375 g 12.5 mL/hr over 240 Minutes Intravenous Every 8 hours 09/30/13 1541 10/03/13 1702   09/30/13 1445  vancomycin (VANCOCIN) 2,000 mg in sodium chloride 0.9 % 500 mL IVPB  Status:  Discontinued     2,000 mg 250 mL/hr over 120 Minutes Intravenous STAT 09/30/13 1440 09/30/13 1441   09/30/13 1445  vancomycin (VANCOCIN) 2,500 mg in sodium chloride 0.9 %  500 mL IVPB     2,500 mg 250 mL/hr over 120 Minutes Intravenous STAT 09/30/13 1441 10/03/13 0916   09/30/13  1430  piperacillin-tazobactam (ZOSYN) IVPB 3.375 g     3.375 g 100 mL/hr over 30 Minutes Intravenous  Once 09/30/13 1429 09/30/13 1545   09/30/13 1430  vancomycin (VANCOCIN) IVPB 1000 mg/200 mL premix  Status:  Discontinued     1,000 mg 200 mL/hr over 60 Minutes Intravenous  Once 09/30/13 1429 09/30/13 1440      Assessment: 43 y/o obese female who has been on vancomycin for CoNS bacteremia, cellulitis, and abscesses. Today is day 4 of antibiotics. Culture sensitivities are back and CoNS is sensitive to cefazolin. Pharmacy consulted to dose cefazolin. She was just given a dose of vancomycin. Will start cefazolin later tonight.  Vanc 8/14>>8/17 Zosyn 8/14>>8/17 Cefazolin 8/17>>  8/14 Blood - CNS (sens to cefazolin) 8/14 Urine - 85K, 70K multiple colonies  Goal of Therapy:  Eradication of infection  Plan:  - Cefazolin 2 g IV q8h - Monitor renal function  [facility], [address].D., BCPS Clinical Pharmacist Pager: [phone] 10/03/2013 5:13 PM

## 2013-10-04 ENCOUNTER — Inpatient Hospital Stay (HOSPITAL_COMMUNITY): Payer: Medicare Other

## 2013-10-04 DIAGNOSIS — R7881 Bacteremia: Secondary | ICD-10-CM

## 2013-10-04 DIAGNOSIS — B957 Other staphylococcus as the cause of diseases classified elsewhere: Secondary | ICD-10-CM

## 2013-10-04 DIAGNOSIS — E039 Hypothyroidism, unspecified: Secondary | ICD-10-CM | POA: Insufficient documentation

## 2013-10-04 LAB — CBC
HCT: 30.9 % — ABNORMAL LOW (ref 36.0–46.0)
HEMOGLOBIN: 10 g/dL — AB (ref 12.0–15.0)
MCH: 28 pg (ref 26.0–34.0)
MCHC: 32.4 g/dL (ref 30.0–36.0)
MCV: 86.6 fL (ref 78.0–100.0)
PLATELETS: 180 10*3/uL (ref 150–400)
RBC: 3.57 MIL/uL — ABNORMAL LOW (ref 3.87–5.11)
RDW: 17.2 % — ABNORMAL HIGH (ref 11.5–15.5)
WBC: 20 10*3/uL — ABNORMAL HIGH (ref 4.0–10.5)

## 2013-10-04 LAB — BASIC METABOLIC PANEL
ANION GAP: 12 (ref 5–15)
BUN: 21 mg/dL (ref 6–23)
CHLORIDE: 101 meq/L (ref 96–112)
CO2: 24 mEq/L (ref 19–32)
CREATININE: 1.15 mg/dL — AB (ref 0.50–1.10)
Calcium: 8 mg/dL — ABNORMAL LOW (ref 8.4–10.5)
GFR, EST AFRICAN AMERICAN: 66 mL/min — AB (ref >90)
GFR, EST NON AFRICAN AMERICAN: 57 mL/min — AB (ref >90)
Glucose, Bld: 95 mg/dL (ref 70–99)
Potassium: 3.6 mEq/L — ABNORMAL LOW (ref 3.7–5.3)
Sodium: 137 mEq/L (ref 137–147)

## 2013-10-04 LAB — PROCALCITONIN: PROCALCITONIN: 4.93 ng/mL

## 2013-10-04 MED ORDER — PRO-STAT SUGAR FREE PO LIQD: 30.0000 mL | Freq: Three times a day (TID) | ORAL | Status: AC

## 2013-10-04 MED ORDER — CEFAZOLIN SODIUM-DEXTROSE 2-3 GM-% IV SOLR: 2.0000 g | Freq: Three times a day (TID) | INTRAVENOUS | Status: DC

## 2013-10-04 MED ORDER — PRO-STAT SUGAR FREE PO LIQD
30.0000 mL | Freq: Three times a day (TID) | ORAL | Status: DC
  Administered 2013-10-04: 30 mL via ORAL
  Filled 2013-10-04 (×2): qty 30

## 2013-10-04 MED ORDER — AMIODARONE HCL 200 MG PO TABS: 200.0000 mg | ORAL_TABLET | Freq: Two times a day (BID) | ORAL | Status: DC

## 2013-10-04 MED ORDER — METOPROLOL TARTRATE 25 MG PO TABS: 12.5000 mg | ORAL_TABLET | Freq: Two times a day (BID) | ORAL | Status: DC

## 2013-10-04 MED ORDER — DILTIAZEM HCL 60 MG PO TABS
60.0000 mg | ORAL_TABLET | Freq: Four times a day (QID) | ORAL | Status: DC
  Administered 2013-10-04: 60 mg via ORAL
  Filled 2013-10-04 (×3): qty 1

## 2013-10-04 NOTE — Progress Notes (Signed)
INITIAL NUTRITION ASSESSMENT  DOCUMENTATION CODES Per approved criteria  -Morbid Obesity   INTERVENTION: - Pt educated on a high-protein, modified carbohydrate diet for weight loss. Provided pt with instructions and handouts. Used teach-back method.  - Prostat TID, each supplement provides 100 kcal and 15 g of protein. - RD will continue to monitor for nutrition care plan.  NUTRITION DIAGNOSIS: Obesity related to nutrient intake above estimated needs as evidenced by BMI of 127.2.   Goal: Pt to meet >/= 90% of their estimated nutrition needs   Monitor:  Weight trend, level of knowledge, wound healing, labs, dietary changes  Reason for Assessment: Consult for Diet Education for weight loss  43 y.o. female  Admitting Dx: Coag negative Staphylococcus bacteremia  ASSESSMENT: 43 year old female with history of morbid obesity, paroxysmal atrial fibrillation on xarelto, lymphedema, diastolic CHF, obstructive sleep apnea was sent [facility] for left leg pain, rapid heart rate, fever today. History was obtained from the patient who reported that she was having a fever of 101.2 at the facility, was hypotensive BP 85/35 and heart rate 141. She has a history of recurrent cellulitis, thought that her leg was infected.   - Pt with cellulitis of legs with open ulcers/abcess.  Lymphedema in both legs.  - Pt interested in weight loss strategies and a diet low in carbohydrate and high in protein. Pt was provided with handouts for food that are high in protein and a carbohydrate modified diet handout.  - Weight recorded in chart over last five months has fluctuated from 443-648 lbs.  Height: Ht Readings from Last 1 Encounters:  09/30/13 5' (1.524 m)    Weight: Wt Readings from Last 1 Encounters:  10/02/13 648 lb (293.931 kg)    Ideal Body Weight: 45.5 kg  % Ideal Body Weight: 646%  Wt Readings from Last 10 Encounters:  10/02/13 648 lb (293.931 kg)  09/05/13  581 lb (263.54 kg)  08/15/13 443 lb 2 oz (201 kg)  07/25/13 573 lb (259.911 kg)  07/21/13 573 lb (259.911 kg)  07/04/13 564 lb (255.829 kg)  06/07/13 564 lb (255.829 kg)  05/12/13 550 lb (249.478 kg)    Usual Body Weight: unknown  % Usual Body Weight: n/a  BMI:  Body mass index is 126.55 kg/(m^2).  Estimated Nutritional Needs: Kcal: 1800-2000 Protein: 120-130 g Fluid: >2.0 L/day  Skin: Stage II pressure ulcer on left leg, stage II pressure ulcer on buttocks, pressure ulcer on right upper thigh  Diet Order: Cardiac  EDUCATION NEEDS: -Education needs addressed   Intake/Output Summary (Last 24 hours) at 10/04/13 1349 Last data filed at 10/04/13 1100  Gross per 24 hour  Intake    225 ml  Output   1500 ml  Net  -1275 ml    Last BM: 8/17   Labs:   Recent Labs Lab 10/02/13 0350 10/03/13 1205 10/04/13 0348  NA 135* 139 137  K 3.9 4.3 3.6*  CL 99 102 101  CO2 27 26 24   BUN 21 21 21   CREATININE 1.20* 1.16* 1.15*  CALCIUM 8.1* 8.3* 8.0*  GLUCOSE 94 92 95    CBG (last 3)  No results found for this basename: GLUCAP,  in the last 72 hours  Scheduled Meds: . amiodarone  200 mg Oral BID  .  ceFAZolin (ANCEF) IV  2 g Intravenous 3 times per day  . Chlorhexidine Gluconate Cloth  6 each Topical Q0600  . diclofenac sodium  2 g Topical QID  .  diltiazem  60 mg Oral 4 times per day  . furosemide  20 mg Oral BID  . metoprolol tartrate  12.5 mg Oral 3 times per day  . mupirocin ointment  1 application Nasal BID  . rivaroxaban  20 mg Oral Q supper    Continuous Infusions:   Past Medical History  Diagnosis Date  . Morbid obesity   . Lymphedema of both lower extremities   . Diastolic CHF   . Vitamin D deficiency     History reviewed. No pertinent past surgical history.  Ebbie Latus RD, LDN

## 2013-10-04 NOTE — Discharge Summary (Addendum)
Physician Discharge Summary  Michele MowersSharon Weber JXB:[id number] DOB: 1970-07-08 DOA: 09/30/2013  PCP: Karlene EinsteinASANAYAKA,GAYANI, MD  Admit date: 09/30/2013 Discharge date: 10/04/2013  Time spent: >45 minutes  Recommendations for Outpatient Follow-up:  Will need blood cultures today or tomorrow AM at [facility] to ensure clearing of bacteremia  1. Follow on weight loss 2. Follow HR - maintain rate control 3. F/u BP as she has borderline hypotension 4. Bmet and CBC tomorrow AM 5. 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 female with A-fib, HTN, lymphedema and diastolic CHF who is morbidly obese, bed bound, living in a nursing facility for rehab.  She is admitted for sepsis from left leg cellulitis and for a-fib with RVR. She has a h/o multiple episodes of left leg cellulitis in the past. She was febrile, hypotensive with A-fib with RVR in the ER. She was admitted to the SDU, started on an Amiodarone infusion and broad spectrum antibiotics with Vanc and Zosyn.    She was found to have coag neg staph on both sets of blood cultures.   Hospital Course:  Principal Problem:  Sepsis- extensive left leg cellulitis, severe hypotension and coag neg staph bacteremia - has had recurrent infections of the left leg  - coag neg staph on blood cultures  - Has been receiving Vanc and Zosyn - WBC count still significantly elevated  - ID attending transitioned her to [facility] yesterday- follow WBC  counts.  Active Problems:  Atrial fibrillation with RVR  - started on IV Amiodarone as BP quite low on admission- now switched to oral Amiodarone - BP has improved with treatment of sepsis and with hydration.  - Cardizem and Metoprolol resumed with noted improvement in rate which in now in 80s - HR slightly up to low 100s today- will need to follow and adjust medications appropriately - I have increased Cardizem from TID to QID for now - cont Xarelto for stroke prevention   Diastolic CHF  - compensated- resumed Lasix today to prevent fluid overload  Severe lymph edema  - b/l legs- severe blistering and weeping of legs large areas of skin sloughing  - WOC has evaluated her- will be transitioned to an LTAC for further wound care  -we obtained ultrasound of the wound on her left thigh which appears to have pus - no underlying abscess seen  Hypotension  - cont to treat infection and follow- stopped IVF to prevent fluid overload   Morbid obesity  - have discussed diet and weight loss- nutritionist consulted as well   Procedures:  none  Consultations:  ID  Cardiology  Discharge Exam: Filed Vitals:   10/04/13 0812 10/04/13 1100 10/04/13 1103 10/04/13 1130  BP: 100/72  128/74   Pulse: 103     Temp:  98.9 F (37.2 C)    TempSrc:  Oral    Resp: 18   22  Height:      Weight:      SpO2: 94%  General: No acute respiratory distress- morbidly obese  Lungs: Clear to auscultation bilaterally without wheezes or crackles  Cardiovascular: Regular rate and rhythm without murmur gallop or rub normal S1 and S2  Abdomen: Nontender, nondistended, soft, bowel sounds positive, no rebound, no ascites, no appreciable mass  Extremities: No significant cyanosis, clubbing,+ edema bilateral lower extremities with cellulitis of left leg and numerous draining wounds on left thigh- clear yellow liquid is draining- wound on right upper thigh is small but appears more deep - I am able to  express thick tan/yellow material from this wound      Discharge Instructions You were cared for by a hospitalist during your hospital stay. If you have any questions about your discharge medications or the care you received while you were in the hospital after you are discharged, you can call the unit and asked to speak with the hospitalist on call if the hospitalist that took care of you is not available. Once you are discharged, your primary care physician will handle any further medical issues. Please note that NO REFILLS for any discharge medications will be authorized once you are discharged, as it is imperative that you return to your primary care physician (or establish a relationship with a primary care physician if you do not have one) for your aftercare needs so that they can reassess your need for medications and monitor your lab values.      Discharge Instructions   Diet - low sodium heart healthy    Complete by:  As directed   High protein and low carb     Increase activity slowly    Complete by:  As directed             Medication List         amiodarone 200 MG tablet  Commonly known as:  PACERONE  Take 1 tablet (200 mg total) by mouth 2 (two) times daily.     ceFAZolin 2-3 GM-% Solr  Commonly known as:  ANCEF  Inject 50 mLs (2 g total) into the vein every 8 (eight) hours.     diclofenac sodium 1 % Gel  Commonly known as:  VOLTAREN  Apply 2 g topically 4 (four) times daily as needed (for knee pain).     diltiazem 180 MG 24 hr capsule  Commonly known as:  CARDIZEM CD  Take 1 capsule (180 mg total) by mouth daily.     furosemide 40 MG tablet  Commonly known as:  LASIX  Take 60 mg by mouth 2 (two) times daily.     metoprolol tartrate 25 MG tablet  Commonly known as:  LOPRESSOR  Take 0.5 tablets (12.5 mg total) by mouth 2 (two) times daily.     OVER THE COUNTER MEDICATION  Inhale 1 application into the lungs at bedtime. CPAP-at bedtime     potassium  chloride SA 20 MEQ tablet  Commonly known as:  K-DUR,KLOR-CON  Take 40 mEq by mouth 2 (two) times daily.     rivaroxaban 20 MG Tabs tablet  Commonly known as:  XARELTO  Take 1 tablet (20 mg total) by mouth daily with supper.     TYLENOL 8 HOUR 650 MG CR tablet  Generic drug:  acetaminophen  Take 650 mg by mouth every 8 (eight) hours as needed for pain.     vitamin C 500 MG tablet  Commonly known as:  ASCORBIC ACID  Take 500 mg by mouth 2 (two) times daily.  No Known Allergies    The results of significant diagnostics from this hospitalization (including imaging, microbiology, ancillary and laboratory) are listed below for reference.    Significant Diagnostic Studies: [place] Extrem Low Bilat Ltd  10/04/2013   CLINICAL DATA:  Bilateral lower extremity swelling.  EXAM: BILATERAL LOWER EXTREMITY LIMITED SOFT TISSUE ULTRASOUND  TECHNIQUE: Ultrasound examination of the lower extremity soft tissues was performed in the area of clinical concern.  COMPARISON:  None.  FINDINGS: No abnormal fluid collection or evidence of abscess is seen in the soft tissues of either thigh. Subcutaneous edema is noted which may represent cellulitis.  IMPRESSION: No definite evidence of abscess seen in either thigh.   Electronically Signed   By: Roque Lias M.D.   On: 10/04/2013 08:46   Dg Chest Port 1 View  09/30/2013   CLINICAL DATA:  Sepsis. Atrial fibrillation. Congestive heart failure. Morbid obesity.  EXAM: PORTABLE CHEST - 1 VIEW  COMPARISON:  08/11/2013  FINDINGS: Mild cardiomegaly and pulmonary vascular congestion remain stable. No evidence of pulmonary consolidation or pleural effusion. Technically suboptimal exam due to large patient habitus.  IMPRESSION: Stable mild cardiomegaly and pulmonary vascular congestion. No acute findings.   Electronically Signed   By: Myles Rosenthal M.D.   On: 09/30/2013 15:25    Microbiology: Recent Results (from the past 240 hour(s))  CULTURE, BLOOD (ROUTINE X 2)      Status: None   Collection Time    09/30/13  2:43 PM      Result Value Ref Range Status   Specimen Description BLOOD ARM RIGHT   Final   Special Requests BOTTLES DRAWN AEROBIC AND ANAEROBIC [facility]   Final   Culture  Setup Time     Final   Value: 10/01/2013 00:43     Performed at [facility]   Culture     Final   Value: STAPHYLOCOCCUS SPECIES (COAGULASE NEGATIVE)     Note: THE SIGNIFICANCE OF ISOLATING THIS ORGANISM FROM A SINGLE SET OF BLOOD CULTURES WHEN MULTIPLE SETS ARE DRAWN IS UNCERTAIN. PLEASE NOTIFY THE MICROBIOLOGY DEPARTMENT WITHIN ONE WEEK IF SPECIATION AND SENSITIVITIES ARE REQUIRED.     Note: Gram Stain Report Called to,Read Back By and Verified With: Windell Moment RN on 10/02/13 at 01:15 by Christie Nottingham     Performed at [facility]   Report Status 10/03/2013 FINAL   Final  CULTURE, BLOOD (ROUTINE X 2)     Status: None   Collection Time    09/30/13  3:06 PM      Result Value Ref Range Status   Specimen Description BLOOD FOREARM RIGHT   Final   Special Requests BOTTLES DRAWN AEROBIC AND ANAEROBIC [facility]   Final   Culture  Setup Time     Final   Value: 10/01/2013 00:43     Performed at [facility]   Culture     Final   Value: STAPHYLOCOCCUS SPECIES (COAGULASE NEGATIVE)     Note: RIFAMPIN AND GENTAMICIN SHOULD NOT BE USED AS SINGLE DRUGS FOR TREATMENT OF STAPH INFECTIONS. This organism DOES NOT demonstrate inducible Clindamycin resistance in vitro.     Note: Gram Stain Report Called to,Read Back By and Verified With: TRACY DAVIS 10/01/13 @ 6:15PM BY RUSCOE A.     Performed at [facility]   Report Status 10/03/2013 FINAL   Final   Organism ID, Bacteria STAPHYLOCOCCUS SPECIES (COAGULASE NEGATIVE)   Final  URINE CULTURE     Status: None   Collection  Time    09/30/13  3:50 PM      Result Value Ref Range Status   Specimen Description URINE, RANDOM   Final   Special Requests NONE   Final   Culture  Setup Time     Final   Value: 09/30/2013 20:38      Performed at [facility] Count     Final   Value: 85,000 COLONIES/ML     Performed at [facility]   Culture     Final   Value: Multiple bacterial morphotypes present, none predominant. Suggest appropriate recollection if clinically indicated.     Performed at [facility]   Report Status 10/01/2013 FINAL   Final  MRSA PCR SCREENING     Status: Abnormal   Collection Time    09/30/13  6:59 PM      Result Value Ref Range Status   MRSA by PCR POSITIVE (*) NEGATIVE Final   Comment:            The GeneXpert MRSA Assay (FDA     approved for NASAL specimens     only), is one component of a     comprehensive MRSA colonization     surveillance program. It is not     intended to diagnose MRSA     infection nor to guide or     monitor treatment for     MRSA infections.     RESULT CALLED TO, READ BACK BY AND VERIFIED WITH:     L SUMMERS RN 2146 09/30/13 A BROWNING  URINE CULTURE     Status: None   Collection Time    09/30/13  7:54 PM      Result Value Ref Range Status   Specimen Description URINE, CATHETERIZED   Final   Special Requests NONE   Final   Culture  Setup Time     Final   Value: 09/30/2013 20:27     Performed at [facility] Count     Final   Value: 70,000 COLONIES/ML     Performed at [facility]   Culture     Final   Value: Multiple bacterial morphotypes present, none predominant. Suggest appropriate recollection if clinically indicated.     Performed at [facility]   Report Status 10/02/2013 FINAL   Final  WOUND CULTURE     Status: None   Collection Time    10/02/13  1:55 PM      Result Value Ref Range Status   Specimen Description WOUND RIGHT LEG   Final   Special Requests Normal   Final   Gram Stain     Final   Value: NO WBC SEEN     NO SQUAMOUS EPITHELIAL CELLS SEEN     RARE GRAM POSITIVE COCCI     IN PAIRS IN CLUSTERS     Performed at [facility]   Culture     Final   Value: Culture  reincubated for better growth     Performed at [facility]   Report Status PENDING   Incomplete     Labs: Basic Metabolic Panel:  Recent Labs Lab 09/30/13 1440 10/02/13 0350 10/03/13 1205 10/04/13 0348  NA 138 135* 139 137  K 4.4 3.9 4.3 3.6*  CL 99 99 102 101  CO2 24 27 26 24   GLUCOSE 95 94 92 95  BUN 20 21 21 21   CREATININE 0.99 1.20* 1.16*  1.15*  CALCIUM 8.2* 8.1* 8.3* 8.0*   Liver Function Tests:  Recent Labs Lab 09/30/13 1440  AST 8  ALT <5  ALKPHOS 41  BILITOT 1.0  PROT 7.1  ALBUMIN 2.4*   No results found for this basename: LIPASE, AMYLASE,  in the last 168 hours No results found for this basename: AMMONIA,  in the last 168 hours CBC:  Recent Labs Lab 09/30/13 1440 10/02/13 0350 10/03/13 1205 10/04/13 0348  WBC 8.9 23.6* 23.4* 20.0*  NEUTROABS 8.3*  --   --   --   HGB 11.4* 9.8* 10.9* 10.0*  HCT 35.5* 31.2* 33.4* 30.9*  MCV 87.2 87.4 88.8 86.6  PLT 196 183 177 180   Cardiac Enzymes:  Recent Labs Lab 09/30/13 1440 09/30/13 2054 10/01/13 0305  TROPONINI <0.30 <0.30 <0.30   BNP: BNP (last 3 results) No results found for this basename: PROBNP,  in the last 8760 hours CBG: No results found for this basename: GLUCAP,  in the last 168 hours     Signed:  Calvert Cantor, MD  [facility] 10/04/2013, 1:59 PM

## 2013-10-04 NOTE — Progress Notes (Signed)
Subjective:  Patient denies any palpitation lightheadedness or syncope. Remains in atrial fibrillation with controlled ventricular response  Objective:  Vital Signs in the last 24 hours: Temp:  [98.2 F (36.8 C)-99.3 F (37.4 C)] 98.2 F (36.8 C) (08/18 0700) Pulse Rate:  [91-117] 104 (08/18 0355) Resp:  [20-26] 20 (08/18 0355) BP: (109-123)/(60-76) 109/60 mmHg (08/18 0355) SpO2:  [94 %-97 %] 95 % (08/18 0355)  Intake/Output from previous day: 08/17 0701 - 08/18 0700 In: 1095.2 [P.O.:845; I.V.:100.2; IV Piggyback:150] Out: 1600 [Urine:1600] Intake/Output from this shift:    Physical Exam: Unchanged  Lab Results:  Recent Labs  10/03/13 1205 10/04/13 0348  WBC 23.4* 20.0*  HGB 10.9* 10.0*  PLT 177 180    Recent Labs  10/03/13 1205 10/04/13 0348  NA 139 137  K 4.3 3.6*  CL 102 101  CO2 26 24  GLUCOSE 92 95  BUN 21 21  CREATININE 1.16* 1.15*   No results found for this basename: TROPONINI, CK, MB,  in the last 72 hours Hepatic Function Panel No results found for this basename: PROT, ALBUMIN, AST, ALT, ALKPHOS, BILITOT, BILIDIR, IBILI,  in the last 72 hours No results found for this basename: CHOL,  in the last 72 hours No results found for this basename: PROTIME,  in the last 72 hours  Imaging: Imaging results have been reviewed and [employer]  10/04/2013   CLINICAL DATA:  Bilateral lower extremity swelling.  EXAM: BILATERAL LOWER EXTREMITY LIMITED SOFT TISSUE ULTRASOUND  TECHNIQUE: Ultrasound examination of the lower extremity soft tissues was performed in the area of clinical concern.  COMPARISON:  None.  FINDINGS: No abnormal fluid collection or evidence of abscess is seen in the soft tissues of either thigh. Subcutaneous edema is noted which may represent cellulitis.  IMPRESSION: No definite evidence of abscess seen in either thigh.   Electronically Signed   By: Roque LiasJames  Green M.D.   On: 10/04/2013 08:46    Cardiac Studies:  Assessment/Plan:   Chronic atrial fibrillation  Cellulitis of legs with open ulcers /abscess Massive chronic bilateral lymphedema  Obstructive sleep apnea on CPAP  Morbidly obese  Compensated and congestive heart failure secondary to preserved LV systolic function  Vitamin D deficiency Plan Continue present management Check EKG in a.m.  LOS: 4 days    Aminat Shelburne N 10/04/2013, 10:52 AM

## 2013-10-04 NOTE — Progress Notes (Addendum)
[facility]  Date of Admission:  09/30/2013  Antibiotics: Cefazolin day 2  Subjective: No complaints  Objective: Temp:  [98.2 F (36.8 C)-99.3 F (37.4 C)] 98.2 F (36.8 C) (08/18 0700) Pulse Rate:  [91-117] 104 (08/18 0355) Resp:  [20-26] 20 (08/18 0355) BP: (109-123)/(60-76) 109/60 mmHg (08/18 0355) SpO2:  [94 %-97 %] 95 % (08/18 0355)  General: awake, alert, nad Skin: no rashes Lungs: CTA B Cor: RRR Abdomen: obese, nt Ext: left leg with drainage, blistering from cellulitis, surrounding erythema with sloughing skin.  Less induration in upper portion of leg and draining  Lab Results Lab Results  Component Value Date   WBC 20.0* 10/04/2013   HGB 10.0* 10/04/2013   HCT 30.9* 10/04/2013   MCV 86.6 10/04/2013   PLT 180 10/04/2013    Lab Results  Component Value Date   CREATININE 1.15* 10/04/2013   BUN 21 10/04/2013   NA 137 10/04/2013   K 3.6* 10/04/2013   CL 101 10/04/2013   CO2 24 10/04/2013    Lab Results  Component Value Date   ALT <5 09/30/2013   AST 8 09/30/2013   ALKPHOS 41 09/30/2013   BILITOT 1.0 09/30/2013      Microbiology: Recent Results (from the past 240 hour(s))  CULTURE, BLOOD (ROUTINE X 2)     Status: None   Collection Time    09/30/13  2:43 PM      Result Value Ref Range Status   Specimen Description BLOOD ARM RIGHT   Final   Special Requests BOTTLES DRAWN AEROBIC AND ANAEROBIC [facility]   Final   Culture  Setup Time     Final   Value: 10/01/2013 00:43     Performed at [facility]   Culture     Final   Value: STAPHYLOCOCCUS SPECIES (COAGULASE NEGATIVE)     Note: THE SIGNIFICANCE OF ISOLATING THIS ORGANISM FROM A SINGLE SET OF BLOOD CULTURES WHEN MULTIPLE SETS ARE DRAWN IS UNCERTAIN. PLEASE NOTIFY THE MICROBIOLOGY DEPARTMENT WITHIN ONE WEEK IF SPECIATION AND SENSITIVITIES ARE REQUIRED.     Note: Gram Stain Report Called to,Read Back By and Verified With: Windell Moment RN on 10/02/13 at 01:15 by Christie Nottingham     Performed  at [facility]   Report Status 10/03/2013 FINAL   Final  CULTURE, BLOOD (ROUTINE X 2)     Status: None   Collection Time    09/30/13  3:06 PM      Result Value Ref Range Status   Specimen Description BLOOD FOREARM RIGHT   Final   Special Requests BOTTLES DRAWN AEROBIC AND ANAEROBIC [facility]   Final   Culture  Setup Time     Final   Value: 10/01/2013 00:43     Performed at [facility]   Culture     Final   Value: STAPHYLOCOCCUS SPECIES (COAGULASE NEGATIVE)     Note: RIFAMPIN AND GENTAMICIN SHOULD NOT BE USED AS SINGLE DRUGS FOR TREATMENT OF STAPH INFECTIONS. This organism DOES NOT demonstrate inducible Clindamycin resistance in vitro.     Note: Gram Stain Report Called to,Read Back By and Verified With: TRACY DAVIS 10/01/13 @ 6:15PM BY RUSCOE A.     Performed at [facility]   Report Status 10/03/2013 FINAL   Final   Organism ID, Bacteria STAPHYLOCOCCUS SPECIES (COAGULASE NEGATIVE)   Final  URINE CULTURE     Status: None   Collection Time    09/30/13  3:50 PM  Result Value Ref Range Status   Specimen Description URINE, RANDOM   Final   Special Requests NONE   Final   Culture  Setup Time     Final   Value: 09/30/2013 20:38     Performed at [facility]   Colony Count     Final   Value: 85,000 COLONIES/ML     Performed at [facility]   Culture     Final   Value: Multiple bacterial morphotypes present, none predominant. Suggest appropriate recollection if clinically indicated.     Performed at [facility]   Report Status 10/01/2013 FINAL   Final  MRSA PCR SCREENING     Status: Abnormal   Collection Time    09/30/13  6:59 PM      Result Value Ref Range Status   MRSA by PCR POSITIVE (*) NEGATIVE Final   Comment:            The GeneXpert MRSA Assay (FDA     approved for NASAL specimens     only), is one component of a     comprehensive MRSA colonization     surveillance program. It is not     intended to diagnose MRSA      infection nor to guide or     monitor treatment for     MRSA infections.     RESULT CALLED TO, READ BACK BY AND VERIFIED WITH:     L SUMMERS RN [id number] 09/30/13 A BROWNING  URINE CULTURE     Status: None   Collection Time    09/30/13  7:54 PM      Result Value Ref Range Status   Specimen Description URINE, CATHETERIZED   Final   Special Requests NONE   Final   Culture  Setup Time     Final   Value: 09/30/2013 20:27     Performed at [facility]   Colony Count     Final   Value: 70,000 COLONIES/ML     Performed at [facility]   Culture     Final   Value: Multiple bacterial morphotypes present, none predominant. Suggest appropriate recollection if clinically indicated.     Performed at [facility]   Report Status 10/02/2013 FINAL   Final  WOUND CULTURE     Status: None   Collection Time    10/02/13  1:55 PM      Result Value Ref Range Status   Specimen Description WOUND RIGHT LEG   Final   Special Requests Normal   Final   Gram Stain     Final   Value: NO WBC SEEN     NO SQUAMOUS EPITHELIAL CELLS SEEN     RARE GRAM POSITIVE COCCI     IN PAIRS IN CLUSTERS     Performed at [facility]   Culture     Final   Value: Culture reincubated for better growth     Performed at [facility]   Report Status PENDING   Incomplete    Studies/Results: [employer]  10/04/2013   CLINICAL DATA:  Bilateral lower extremity swelling.  EXAM: BILATERAL LOWER EXTREMITY LIMITED SOFT TISSUE ULTRASOUND  TECHNIQUE: Ultrasound examination of the lower extremity soft tissues was performed in the area of clinical concern.  COMPARISON:  None.  FINDINGS: No abnormal fluid collection or evidence of abscess is seen in the soft tissues of either thigh. Subcutaneous edema is noted which  may represent cellulitis.  IMPRESSION: No definite evidence of abscess seen in either thigh.   Electronically Signed   By: Roque Lias M.D.   On: 10/04/2013 08:46     Assessment/Plan: 1) cellulitis with abscess - WBC now improving.  Ultrasound did not show any fluid pocket though I am not sure it was done in all the areas, particularly that of concern.  I do though think the area of firm induration is improving and draining on its own.  She has remained afebrile.   Due to size and difficulty in healing, history of this in the past, I favor a course of 3 more weeks of keflex after discharge.   She also asked about the possibility of surgery to remove some of the fat tissue in the leg with the recurrent infection.  ? Plastic surgery evaulation after she has healed after discharge.     Staci Righter, MD [facility] [email or web address] [phone] pager   [phone] cell 10/04/2013, 10:20 AM

## 2013-10-04 NOTE — Progress Notes (Signed)
Patient ID: Michele MowersSharon Weber, female   DOB: Sep 08, 1970, 43 y.o.   MRN: [id number]           PROGRESS NOTE  DATE: 09/29/2013         [facility]  LEVEL OF CARE: SNF (31)  Acute Visit  CHIEF COMPLAINT:  Manage hypothyroidism and lower extremity edema.    HISTORY OF PRESENT ILLNESS: I was requested by the staff to assess the patient regarding above problem(s):  HYPOTHYROIDISM:  On 09/28/2013:  Patient's TSH was 7.35.  She is currently not on levothyroxine.   She denies fatigue or constipation.    EDEMA: The patient's edema is unstable.  Patient is complaining of increased bilateral lower extremity swelling for a few days.   Patient denies calf pain, chest pain or shortness of breath. No complications reported from the medications currently being used.     PAST MEDICAL HISTORY : Reviewed.  No changes/see problem list  CURRENT MEDICATIONS: Reviewed per MAR/see medication list  REVIEW OF SYSTEMS:  GENERAL: no change in appetite, no fatigue, no weight changes, no fever, chills or weakness RESPIRATORY: no cough, SOB, DOE,, wheezing, hemoptysis CARDIAC: no chest pain or palpitations;  chronic lower extremity swelling    GI: no abdominal pain, diarrhea, constipation, heart burn, nausea or vomiting  PHYSICAL EXAMINATION  VS: see VS section  GENERAL: no acute distress, morbidly obese body habitus EYES: conjunctivae normal, sclerae normal, normal eye lids NECK: supple, trachea midline, no neck masses, no thyroid tenderness, no thyromegaly LYMPHATICS: no LAN in the neck, no supraclavicular LAN RESPIRATORY: breathing is even & unlabored, BS CTAB CARDIAC: RRR, no murmur,no extra heart sounds, +6 bilateral lower extremity edema     GI: abdomen soft, normal BS, no masses, no tenderness, no hepatomegaly, no splenomegaly PSYCHIATRIC: the patient is alert & oriented to person, affect & behavior appropriate  ASSESSMENT/PLAN:  Hypothyroidism.  New problem.  TSH is  less than 10.  Therefore, no need for levothyroxine.  Recheck TSH in eight weeks.    Lower extremity edema.  Unstable problem.  Increase Lasix to 60 mg b.i.d. for four days, then decrease to 40 mg b.i.d.     Hypokalemia.  Since Lasix is being increased, we will increase K-dur to 40 mEq t.i.d. for four days, then decrease to 40 mEq b.i.d.    CPT CODE: [id number]          Angela CoxGayani Y Michele Blaker, MD [facility] [phone]

## 2013-10-08 LAB — WOUND CULTURE
Gram Stain: NONE SEEN
SPECIAL REQUESTS: NORMAL

## 2015-05-19 DIAGNOSIS — A419 Sepsis, unspecified organism: Secondary | ICD-10-CM

## 2015-05-19 HISTORY — DX: Sepsis, unspecified organism: A41.9

## 2015-05-23 ENCOUNTER — Inpatient Hospital Stay (HOSPITAL_COMMUNITY): Payer: Medicare Other

## 2015-05-23 ENCOUNTER — Inpatient Hospital Stay (HOSPITAL_COMMUNITY)
Admission: AD | Admit: 2015-05-23 | Discharge: 2015-06-08 | DRG: 871 | Disposition: A | Discharge status: Skilled Nursing Facility | Payer: Medicare Other | Source: Other Acute Inpatient Hospital | Attending: Family Medicine | Admitting: Family Medicine

## 2015-05-23 DIAGNOSIS — I5032 Chronic diastolic (congestive) heart failure: Secondary | ICD-10-CM | POA: Diagnosis present

## 2015-05-23 DIAGNOSIS — L0390 Cellulitis, unspecified: Secondary | ICD-10-CM | POA: Diagnosis not present

## 2015-05-23 DIAGNOSIS — J9600 Acute respiratory failure, unspecified whether with hypoxia or hypercapnia: Secondary | ICD-10-CM | POA: Diagnosis not present

## 2015-05-23 DIAGNOSIS — Z7951 Long term (current) use of inhaled steroids: Secondary | ICD-10-CM | POA: Diagnosis not present

## 2015-05-23 DIAGNOSIS — Z4659 Encounter for fitting and adjustment of other gastrointestinal appliance and device: Secondary | ICD-10-CM

## 2015-05-23 DIAGNOSIS — Z7401 Bed confinement status: Secondary | ICD-10-CM | POA: Diagnosis not present

## 2015-05-23 DIAGNOSIS — D696 Thrombocytopenia, unspecified: Secondary | ICD-10-CM | POA: Diagnosis present

## 2015-05-23 DIAGNOSIS — I890 Lymphedema, not elsewhere classified: Secondary | ICD-10-CM | POA: Diagnosis present

## 2015-05-23 DIAGNOSIS — A4150 Gram-negative sepsis, unspecified: Secondary | ICD-10-CM | POA: Diagnosis present

## 2015-05-23 DIAGNOSIS — Z7189 Other specified counseling: Secondary | ICD-10-CM | POA: Insufficient documentation

## 2015-05-23 DIAGNOSIS — J9602 Acute respiratory failure with hypercapnia: Secondary | ICD-10-CM | POA: Diagnosis present

## 2015-05-23 DIAGNOSIS — A419 Sepsis, unspecified organism: Secondary | ICD-10-CM

## 2015-05-23 DIAGNOSIS — I959 Hypotension, unspecified: Secondary | ICD-10-CM | POA: Diagnosis present

## 2015-05-23 DIAGNOSIS — R6521 Severe sepsis with septic shock: Secondary | ICD-10-CM

## 2015-05-23 DIAGNOSIS — A499 Bacterial infection, unspecified: Secondary | ICD-10-CM | POA: Diagnosis present

## 2015-05-23 DIAGNOSIS — Z7901 Long term (current) use of anticoagulants: Secondary | ICD-10-CM

## 2015-05-23 DIAGNOSIS — E872 Acidosis: Secondary | ICD-10-CM | POA: Diagnosis present

## 2015-05-23 DIAGNOSIS — J9690 Respiratory failure, unspecified, unspecified whether with hypoxia or hypercapnia: Secondary | ICD-10-CM

## 2015-05-23 DIAGNOSIS — R34 Anuria and oliguria: Secondary | ICD-10-CM | POA: Diagnosis present

## 2015-05-23 DIAGNOSIS — Z452 Encounter for adjustment and management of vascular access device: Secondary | ICD-10-CM

## 2015-05-23 DIAGNOSIS — Z66 Do not resuscitate: Secondary | ICD-10-CM | POA: Diagnosis present

## 2015-05-23 DIAGNOSIS — I482 Chronic atrial fibrillation: Secondary | ICD-10-CM | POA: Diagnosis present

## 2015-05-23 DIAGNOSIS — L0291 Cutaneous abscess, unspecified: Secondary | ICD-10-CM | POA: Diagnosis not present

## 2015-05-23 DIAGNOSIS — L97921 Non-pressure chronic ulcer of unspecified part of left lower leg limited to breakdown of skin: Secondary | ICD-10-CM | POA: Diagnosis present

## 2015-05-23 DIAGNOSIS — Z6841 Body Mass Index (BMI) 40.0 and over, adult: Secondary | ICD-10-CM | POA: Diagnosis not present

## 2015-05-23 DIAGNOSIS — E662 Morbid (severe) obesity with alveolar hypoventilation: Secondary | ICD-10-CM | POA: Diagnosis present

## 2015-05-23 DIAGNOSIS — Z515 Encounter for palliative care: Secondary | ICD-10-CM | POA: Insufficient documentation

## 2015-05-23 DIAGNOSIS — A4102 Sepsis due to Methicillin resistant Staphylococcus aureus: Secondary | ICD-10-CM | POA: Diagnosis not present

## 2015-05-23 DIAGNOSIS — L03319 Cellulitis of trunk, unspecified: Secondary | ICD-10-CM | POA: Diagnosis not present

## 2015-05-23 DIAGNOSIS — L02219 Cutaneous abscess of trunk, unspecified: Secondary | ICD-10-CM | POA: Diagnosis present

## 2015-05-23 DIAGNOSIS — I130 Hypertensive heart and chronic kidney disease with heart failure and stage 1 through stage 4 chronic kidney disease, or unspecified chronic kidney disease: Secondary | ICD-10-CM | POA: Diagnosis present

## 2015-05-23 DIAGNOSIS — N170 Acute kidney failure with tubular necrosis: Secondary | ICD-10-CM | POA: Diagnosis present

## 2015-05-23 DIAGNOSIS — Z959 Presence of cardiac and vascular implant and graft, unspecified: Secondary | ICD-10-CM | POA: Diagnosis not present

## 2015-05-23 DIAGNOSIS — L03116 Cellulitis of left lower limb: Secondary | ICD-10-CM | POA: Diagnosis present

## 2015-05-23 DIAGNOSIS — N179 Acute kidney failure, unspecified: Secondary | ICD-10-CM | POA: Diagnosis not present

## 2015-05-23 DIAGNOSIS — A498 Other bacterial infections of unspecified site: Secondary | ICD-10-CM | POA: Diagnosis present

## 2015-05-23 DIAGNOSIS — Z1624 Resistance to multiple antibiotics: Secondary | ICD-10-CM | POA: Diagnosis not present

## 2015-05-23 DIAGNOSIS — L03115 Cellulitis of right lower limb: Secondary | ICD-10-CM | POA: Diagnosis present

## 2015-05-23 DIAGNOSIS — A488 Other specified bacterial diseases: Secondary | ICD-10-CM | POA: Diagnosis not present

## 2015-05-23 DIAGNOSIS — D649 Anemia, unspecified: Secondary | ICD-10-CM | POA: Diagnosis present

## 2015-05-23 DIAGNOSIS — E875 Hyperkalemia: Secondary | ICD-10-CM | POA: Diagnosis present

## 2015-05-23 DIAGNOSIS — J9601 Acute respiratory failure with hypoxia: Secondary | ICD-10-CM | POA: Diagnosis present

## 2015-05-23 DIAGNOSIS — E559 Vitamin D deficiency, unspecified: Secondary | ICD-10-CM | POA: Diagnosis present

## 2015-05-23 HISTORY — DX: Sepsis, unspecified organism: A41.9

## 2015-05-23 HISTORY — DX: Anemia, unspecified: D64.9

## 2015-05-23 HISTORY — DX: Cellulitis, unspecified: L03.90

## 2015-05-23 HISTORY — DX: Cutaneous abscess, unspecified: L02.91

## 2015-05-23 HISTORY — DX: Unspecified atrial fibrillation: I48.91

## 2015-05-23 LAB — GLUCOSE, CAPILLARY: GLUCOSE-CAPILLARY: 125 mg/dL — AB (ref 65–99)

## 2015-05-23 LAB — COMPREHENSIVE METABOLIC PANEL
ALK PHOS: 34 U/L — AB (ref 38–126)
ALT: 47 U/L (ref 14–54)
ANION GAP: 14 (ref 5–15)
AST: 87 U/L — ABNORMAL HIGH (ref 15–41)
Albumin: 2 g/dL — ABNORMAL LOW (ref 3.5–5.0)
BILIRUBIN TOTAL: 1.3 mg/dL — AB (ref 0.3–1.2)
BUN: 47 mg/dL — ABNORMAL HIGH (ref 6–20)
CALCIUM: 7 mg/dL — AB (ref 8.9–10.3)
CO2: 13 mmol/L — ABNORMAL LOW (ref 22–32)
Chloride: 106 mmol/L (ref 101–111)
Creatinine, Ser: 3.42 mg/dL — ABNORMAL HIGH (ref 0.44–1.00)
GFR, EST AFRICAN AMERICAN: 18 mL/min — AB (ref >60)
GFR, EST NON AFRICAN AMERICAN: 15 mL/min — AB (ref >60)
Glucose, Bld: 145 mg/dL — ABNORMAL HIGH (ref 65–99)
Potassium: 5 mmol/L (ref 3.5–5.1)
Sodium: 133 mmol/L — ABNORMAL LOW (ref 135–145)
TOTAL PROTEIN: 5.8 g/dL — AB (ref 6.5–8.1)

## 2015-05-23 LAB — CBC WITH DIFFERENTIAL/PLATELET
BAND NEUTROPHILS: 0 %
BASOS PCT: 0 %
Basophils Absolute: 0 10*3/uL (ref 0.0–0.1)
Blasts: 0 %
EOS PCT: 0 %
Eosinophils Absolute: 0 10*3/uL (ref 0.0–0.7)
HCT: 39.2 % (ref 36.0–46.0)
Hemoglobin: 12.6 g/dL (ref 12.0–15.0)
LYMPHS ABS: 0.5 10*3/uL — AB (ref 0.7–4.0)
Lymphocytes Relative: 6 %
MCH: 31.6 pg (ref 26.0–34.0)
MCHC: 32.1 g/dL (ref 30.0–36.0)
MCV: 98.2 fL (ref 78.0–100.0)
METAMYELOCYTES PCT: 0 %
MONO ABS: 0.7 10*3/uL (ref 0.1–1.0)
MYELOCYTES: 0 %
Monocytes Relative: 9 %
NEUTROS PCT: 85 %
NRBC: 0 /100{WBCs}
Neutro Abs: 6.9 10*3/uL (ref 1.7–7.7)
Other: 0 %
PLATELETS: 71 10*3/uL — AB (ref 150–400)
Promyelocytes Absolute: 0 %
RBC: 3.99 MIL/uL (ref 3.87–5.11)
RDW: 16.3 % — AB (ref 11.5–15.5)
WBC Morphology: INCREASED
WBC: 8.1 10*3/uL (ref 4.0–10.5)

## 2015-05-23 LAB — CARBOXYHEMOGLOBIN
Carboxyhemoglobin: 1 % (ref 0.5–1.5)
METHEMOGLOBIN: 0.8 % (ref 0.0–1.5)
O2 Saturation: 77.7 %
Total hemoglobin: 12.2 g/dL (ref 12.0–16.0)

## 2015-05-23 LAB — URINALYSIS, ROUTINE W REFLEX MICROSCOPIC
GLUCOSE, UA: NEGATIVE mg/dL
KETONES UR: 15 mg/dL — AB
Nitrite: NEGATIVE
PROTEIN: 100 mg/dL — AB
Specific Gravity, Urine: 1.02 (ref 1.005–1.030)
pH: 5 (ref 5.0–8.0)

## 2015-05-23 LAB — BLOOD GAS, ARTERIAL
Acid-base deficit: 13.9 mmol/L — ABNORMAL HIGH (ref 0.0–2.0)
BICARBONATE: 12.8 meq/L — AB (ref 20.0–24.0)
DRAWN BY: 129711
O2 CONTENT: 4 L/min
O2 Saturation: 98.6 %
PCO2 ART: 36.2 mmHg (ref 35.0–45.0)
PH ART: 7.176 — AB (ref 7.350–7.450)
Patient temperature: 98.6
TCO2: 14 mmol/L (ref 0–100)
pO2, Arterial: 149 mmHg — ABNORMAL HIGH (ref 80.0–100.0)

## 2015-05-23 LAB — URINE MICROSCOPIC-ADD ON

## 2015-05-23 LAB — CORTISOL: Cortisol, Plasma: 44 ug/dL

## 2015-05-23 LAB — TROPONIN I: Troponin I: <0.03 ng/mL (ref <0.031)

## 2015-05-23 LAB — PHOSPHORUS: PHOSPHORUS: 5 mg/dL — AB (ref 2.5–4.6)

## 2015-05-23 LAB — MAGNESIUM: MAGNESIUM: 1.6 mg/dL — AB (ref 1.7–2.4)

## 2015-05-23 LAB — LACTIC ACID, PLASMA: LACTIC ACID, VENOUS: 4.5 mmol/L — AB (ref 0.5–2.0)

## 2015-05-23 LAB — MRSA PCR SCREENING: MRSA BY PCR: NEGATIVE

## 2015-05-23 MED ORDER — SODIUM CHLORIDE 0.9 % IV SOLN
INTRAVENOUS | Status: DC
  Administered 2015-05-23: 14:00:00 via INTRAVENOUS
  Administered 2015-05-23: 50 mL/h via INTRAVENOUS
  Administered 2015-05-24: 20:00:00 via INTRAVENOUS

## 2015-05-23 MED ORDER — DOBUTAMINE-DEXTROSE 2-5 MG/ML-% IV SOLN: 2.5000 ug/kg/min | INTRAVENOUS | Status: DC

## 2015-05-23 MED ORDER — PIPERACILLIN-TAZOBACTAM 3.375 G IVPB
3.3750 g | Freq: Three times a day (TID) | INTRAVENOUS | Status: DC
  Administered 2015-05-23 – 2015-05-24 (×2): 3.375 g via INTRAVENOUS
  Filled 2015-05-23 (×4): qty 50

## 2015-05-23 MED ORDER — SODIUM BICARBONATE 8.4 % IV SOLN
INTRAVENOUS | Status: DC
  Administered 2015-05-23 – 2015-05-28 (×8): via INTRAVENOUS
  Filled 2015-05-23 (×13): qty 150

## 2015-05-23 MED ORDER — NOREPINEPHRINE BITARTRATE 1 MG/ML IV SOLN
2.0000 ug/min | INTRAVENOUS | Status: DC
  Administered 2015-05-23 – 2015-05-24 (×2): 30 ug/min via INTRAVENOUS
  Administered 2015-05-24: 36 ug/min via INTRAVENOUS
  Administered 2015-05-24: 15 ug/min via INTRAVENOUS
  Administered 2015-05-25: 11 ug/min via INTRAVENOUS
  Administered 2015-05-26: 17 ug/min via INTRAVENOUS
  Filled 2015-05-23 (×8): qty 16

## 2015-05-23 MED ORDER — CETYLPYRIDINIUM CHLORIDE 0.05 % MT LIQD
7.0000 mL | Freq: Two times a day (BID) | OROMUCOSAL | Status: DC
  Administered 2015-05-24 – 2015-06-08 (×29): 7 mL via OROMUCOSAL

## 2015-05-23 MED ORDER — SODIUM CHLORIDE 0.9 % IV SOLN
250.0000 mL | INTRAVENOUS | Status: DC | PRN
  Administered 2015-05-23 – 2015-05-26 (×3): 250 mL via INTRAVENOUS

## 2015-05-23 MED ORDER — NOREPINEPHRINE BITARTRATE 1 MG/ML IV SOLN
2.0000 ug/min | INTRAVENOUS | Status: DC
  Administered 2015-05-23: 20 ug/min via INTRAVENOUS
  Filled 2015-05-23 (×3): qty 4

## 2015-05-23 MED ORDER — SILVER SULFADIAZINE 1 % EX CREA
TOPICAL_CREAM | Freq: Every day | CUTANEOUS | Status: DC
Start: 2015-05-23 — End: 2015-06-09
  Administered 2015-05-23 – 2015-05-25 (×3): via TOPICAL
  Administered 2015-05-26 (×2): 1 via TOPICAL
  Administered 2015-05-27: 10:00:00 via TOPICAL
  Administered 2015-05-28 – 2015-05-29 (×2): 1 via TOPICAL
  Administered 2015-05-30 – 2015-06-08 (×9): via TOPICAL
  Filled 2015-05-23 (×9): qty 85

## 2015-05-23 MED ORDER — PIPERACILLIN-TAZOBACTAM 3.375 G IVPB 30 MIN
3.3750 g | Freq: Once | INTRAVENOUS | Status: AC
  Administered 2015-05-23: 3.375 g via INTRAVENOUS
  Filled 2015-05-23: qty 50

## 2015-05-23 MED ORDER — CHLORHEXIDINE GLUCONATE 0.12 % MT SOLN
15.0000 mL | Freq: Two times a day (BID) | OROMUCOSAL | Status: DC
  Administered 2015-05-23 – 2015-06-08 (×31): 15 mL via OROMUCOSAL
  Filled 2015-05-23 (×21): qty 15

## 2015-05-23 MED ORDER — VASOPRESSIN 20 UNIT/ML IV SOLN
0.0300 [IU]/min | INTRAVENOUS | Status: DC
  Administered 2015-05-23 – 2015-05-27 (×5): 0.03 [IU]/min via INTRAVENOUS
  Filled 2015-05-23 (×5): qty 2

## 2015-05-23 MED ORDER — VANCOMYCIN HCL 10 G IV SOLR
2500.0000 mg | Freq: Once | INTRAVENOUS | Status: AC
  Administered 2015-05-23: 2500 mg via INTRAVENOUS
  Filled 2015-05-23 (×2): qty 2500

## 2015-05-23 NOTE — Progress Notes (Signed)
eLink Physician-Brief Progress Note Patient Name: Marcie MowersSharon Laboy DOB: 1970/09/25 MRN: [id number]   Date of Service  05/23/2015  HPI/Events of Note  On levo @ 40  eICU Interventions  Add vaso     Intervention Category Major Interventions: Shock - evaluation and management  Amin Fornwalt V. 05/23/2015, 6:23 PM

## 2015-05-23 NOTE — Progress Notes (Addendum)
Pharmacy Antibiotic Note  Michele MowersSharon Weber is a 45 y.o. female admitted on 05/23/2015 with AMS, hypotension, and morbid obesity [facility].  Pharmacy has been consulted for vancomycin/zosyn dosing.  Plan: Zosyn IV 3.375g x 1, Vancomycin IV 2500 mg x 1 Zosyn IV 3.375g q8h (4hr infusion) Will assess future doses when labs are returned     Temp (24hrs), Avg:0 F (-17.8 C), Min:, Max:  No results for input(s): WBC, CREATININE, LATICACIDVEN, VANCOTROUGH, VANCOPEAK, VANCORANDOM, GENTTROUGH, GENTPEAK, GENTRANDOM, TOBRATROUGH, TOBRAPEAK, TOBRARND, AMIKACINPEAK, AMIKACINTROU, AMIKACIN in the last 168 hours.  CrCl cannot be calculated (Unknown ideal weight.).    No Known Allergies  Antimicrobials this admission: Received vanc, zosyn and a dose of Zyvox at [facility]  Microbiology results: 04/05 blood cx x 2--> sent 04/05 urine cx --> sent 04/05 wound culture --> sent  Thank you for allowing pharmacy to be a part of this patient's care.  [place] PinkGazda, Liylah Najarro P, PharmD 05/23/2015 12:59 PM ________________________________________________________  SCr returned at 3.42 and elevated from previous level of 1.2.   Plan: Will get a 24 hour vancomycin level to assess need for further doses  Sandi CarneNick Patriciaann Rabanal, PharmD Pharmacy Resident Pager: [phone]

## 2015-05-23 NOTE — H&P (Signed)
PULMONARY / CRITICAL CARE MEDICINE   Name: Michele Weber MRN: [id number] DOB: 10-13-1970    ADMISSION DATE:  05/23/2015   REFERRING MD:  Duke Salvia  CHIEF COMPLAINT:  Hypotension  HISTORY OF PRESENT ILLNESS:   MO 850 lbs AAF who presented to [facility] 4/4 withAMS, hypotension , lactic acidosis, elevated pro calcitonin, BC with GNR, draining pannus wounds. She was treated with abx and promptly transferred to [facility] 4/5 on levophed at 20 and dobutamine at 55 cc/hr which we will dc until SVO2 is checked. She has a history of A fib on Xarleto but being held as of now. We will check labs, consult surgery. We have contacted family and changed her code status to LCB with no intubation, cpr of shock. Abx of Vancomycin and zosyn instituted pan cultured. Her prognosis is poor at the time.  PAST MEDICAL HISTORY :  She  has a past medical history of Morbid obesity; Lymphedema of both lower extremities; Diastolic CHF; and Vitamin D deficiency.  PAST SURGICAL HISTORY: She  has no past surgical history on file.  No Known Allergies  No current facility-administered medications on file prior to encounter.   Current Outpatient Prescriptions on File Prior to Encounter  Medication Sig  . acetaminophen (TYLENOL 8 HOUR) 650 MG CR tablet Take 650 mg by mouth every 8 (eight) hours as needed for pain.  . Amino Acids-Protein Hydrolys (FEEDING SUPPLEMENT, PRO-STAT SUGAR FREE 64,) LIQD Take 30 mLs by mouth 3 (three) times daily with meals.  Marland Kitchen amiodarone (PACERONE) 200 MG tablet Take 1 tablet (200 mg total) by mouth 2 (two) times daily.  Marland Kitchen ceFAZolin (ANCEF) 2-3 GM-% SOLR Inject 50 mLs (2 g total) into the vein every 8 (eight) hours.  . diclofenac sodium (VOLTAREN) 1 % GEL Apply 2 g topically 4 (four) times daily as needed (for knee pain).   Marland Kitchen diltiazem (CARDIZEM CD) 180 MG 24 hr capsule Take 1 capsule (180 mg total) by mouth daily.  . furosemide (LASIX) 40 MG tablet Take 60 mg by mouth 2 (two) times  daily.   . metoprolol tartrate (LOPRESSOR) 25 MG tablet Take 0.5 tablets (12.5 mg total) by mouth 2 (two) times daily.  Marland Kitchen OVER THE COUNTER MEDICATION Inhale 1 application into the lungs at bedtime. CPAP-at bedtime  . potassium chloride SA (K-DUR,KLOR-CON) 20 MEQ tablet Take 40 mEq by mouth 2 (two) times daily.  . rivaroxaban (XARELTO) 20 MG TABS tablet Take 1 tablet (20 mg total) by mouth daily with supper.  . vitamin C (ASCORBIC ACID) 500 MG tablet Take 500 mg by mouth 2 (two) times daily.    FAMILY HISTORY:  Her has no family status information on file.   SOCIAL HISTORY: She  reports that she has never smoked. She does not have any smokeless tobacco history on file. She reports that she does not drink alcohol or use illicit drugs.  REVIEW OF SYSTEMS:   NA  SUBJECTIVE:    VITAL SIGNS: There were no vitals taken for this visit.  HEMODYNAMICS:    VENTILATOR SETTINGS:    INTAKE / OUTPUT:    PHYSICAL EXAMINATION: General: MOAAF, confused Neuro:  Confused, no follow commands HEENT: no neck, oral mucosa dry Cardiovascular:  HSD Lungs: Decreased air movement Abdomen:  Huge abd ans pannus Musculoskeletal:  intact Skin:left and right pannus with draining wounds  LABS:  BMET No results for input(s): NA, K, CL, CO2, BUN, CREATININE, GLUCOSE in the last 168 hours.  Electrolytes No results for input(s): CALCIUM,  MG, PHOS in the last 168 hours.  CBC No results for input(s): WBC, HGB, HCT, PLT in the last 168 hours.  Coag's No results for input(s): APTT, INR in the last 168 hours.  Sepsis Markers No results for input(s): LATICACIDVEN, PROCALCITON, O2SATVEN in the last 168 hours.  ABG No results for input(s): PHART, PCO2ART, PO2ART in the last 168 hours.  Liver Enzymes No results for input(s): AST, ALT, ALKPHOS, BILITOT, ALBUMIN in the last 168 hours.  Cardiac Enzymes No results for input(s): TROPONINI, PROBNP in the last 168 hours.  Glucose No results for  input(s): GLUCAP in the last 168 hours.  Imaging No results found.   STUDIES:    CULTURES: 4/5 bc x 2>> 4/5 uc>>  ANTIBIOTICS: 4/5 zoysn>> 4/5 vanc>>  SIGNIFICANT EVENTS: 4/5 transferred to [facility]  4/5 LCB , no intubation, no cpr, no shock  LINES/TUBES: 4/4 left I J cvl>>  DISCUSSION: Super morbidly obese  female(850 lbs) who is a limited code  ASSESSMENT / PLAN:  PULMONARY A: No intubation per code status update 4/5 due obesity  P:   O2 as needed  CARDIOVASCULAR A:  Hypotension from presumed shock P:  Pressors as needed Rate control as needed  RENAL A:   Renal insuff P:   Monitor creatine  GASTROINTESTINAL A:   Super morbidly obese P:   NPO  HEMATOLOGIC A:   On eliquis for chronic afib P:  No anticoagulation till sen by surgery  INFECTIOUS A:   gnr IN BLOOD Cellulitis lower panus infections P:   See ID section Surgical consult  ENDOCRINE A:   Monitor glucose  P:   SSI  NEUROLOGIC A:   AMS/confusion from sepsis , presumed P:   RASS goal: 0 Careful with any sedation in 850 lb person   FAMILY  - Updates:   - Inter-disciplinary family meet or Palliative Care meeting due by:  day 7    Steve Minor ACNP [facility] Pager [phone] till 3 pm If no answer page [phone] 05/23/2015, 12:31 PM   Attending Note:  44 year very super morbidly obese female who [facility] from likely skin breakdown and ?of PNA but unable to perform any diagnostic procedures due to her extreme weight. Unable to measure blood pressure accurately so will need a-line which we have been unable to perform so far. Will pan culture, begin vanc/zosyn, call surgery to evaluate patient, order labs for evaluation. I spoke with daughter over the phone, informed her that her mother is critically ill and that she is ok for now but if deteriorates further then will be unlikely to survive. I informed her that if her mother is to be intubated she  is highly unlikely to be extubated and that if she is to suffer a cardiac arrest then CPR will never be effective in her case due to her size. The daughter agreed that she would be a LCB with no CPR/cardioversion/intubation but treat aggressively otherwise.  Surgical consult called, Dr. Dwain SarnaWakefield informs me that there is nothing to be done surgically unless there is a distinct fluid collection.  Will order U/S of the panus and calf and have wound care see patient.  The patient is critically ill with multiple organ systems failure and requires high complexity decision making for assessment and support, frequent evaluation and titration of therapies, application of advanced monitoring technologies and extensive interpretation of multiple databases.   Critical Care Time devoted to patient care services described in this  note is 35 Minutes. This time reflects time of care of this signee Dr Koren Bound. This critical care time does not reflect procedure time, or teaching time or supervisory time of PA/NP/Med student/Med Resident etc but could involve care discussion time.  Alyson Reedy, M.D. [facility] Pulmonary/Critical Care Medicine. Pager: [phone]. After hours pager: [phone].

## 2015-05-23 NOTE — Progress Notes (Signed)
Critical ABG results Hand Delivered to M. Macasero, RN

## 2015-05-23 NOTE — Progress Notes (Signed)
eLink Physician-Brief Progress Note Patient Name: Marcie MowersSharon Waltman DOB: 08-27-1970 MRN: [id number]   Date of Service  05/23/2015  HPI/Events of Note  AKI metab acidosis  eICU Interventions  Bicarb gtt     Intervention Category Major Interventions: Acid-Base disturbance - evaluation and management  Bailea Beed V. 05/23/2015, 4:28 PM

## 2015-05-23 NOTE — Consult Note (Addendum)
WOC consulted on this patient previously for lymphedema, this was in 2015.  At that time her wounds were located on the right and left lower extremities.  She had left medial thigh wounds, which today she continues to have.   Unfortunately she is over 700 lbs and her girth does not allow for me to easily access the LLE posterior calf wound.  The bedside nurse reports the area to be clean.  The area on the LLE medial thigh is open, but I am not able to measure this accurately.  I would approximate 8x 4x with minimal depth.  This wound is clean, but ruddy.  HEAVY serosanguinous drainage from all areas.  What is most concerning is the dark, purple blistered tissue over the remainder of the leg.  I would be concerned for necrotizing issues with the tissue coloration. And the blistering.  She is not able to answer questions, she only moans.  Her BP is low.  She did open her eyes for me but did not speak. She is a partial code at this time.  No family in the room.   Concerned the bed is not large enough, her thighs are wedged between the side rails.  I will contact [employer] to see what our options may be.    Notes states CCM to have CCS look at her wounds, leg.  May need imaging of the leg to determine exactly what is going on.   WOC team will follow along with you for weekly wound assessments.  Please notify me of any acute changes in the wounds or any new areas of concerns Armen PickupMelody Omaria Plunk RN,CWOCN [phone]  Addendum: 1445: Discussed possible need for oversized bariatric bed with department director.  Current bed accomodates patients weight however her girth is what may be an issue for this bed.  However at the time of my assessment today patient had a very soft BP, was not really [facility].  Pressors being started for BP.  It required hospital staff/fire and rescue staff to move patient onto current bed. We do not feel at this time it is safe to move the patient to a new bed based  on her current medical status.  I will reasses in the am with bedside staff and departmental leadership.  Bed needed for her girth will take about 2 hours to get to the hospital.  Davina PokeM Aneka Fagerstrom RN, [facility]

## 2015-05-24 ENCOUNTER — Inpatient Hospital Stay (HOSPITAL_COMMUNITY): Payer: Medicare Other

## 2015-05-24 ENCOUNTER — Encounter (HOSPITAL_COMMUNITY): Payer: Self-pay | Admitting: General Surgery

## 2015-05-24 DIAGNOSIS — R6521 Severe sepsis with septic shock: Secondary | ICD-10-CM

## 2015-05-24 DIAGNOSIS — J9690 Respiratory failure, unspecified, unspecified whether with hypoxia or hypercapnia: Secondary | ICD-10-CM | POA: Insufficient documentation

## 2015-05-24 DIAGNOSIS — L0291 Cutaneous abscess, unspecified: Secondary | ICD-10-CM | POA: Insufficient documentation

## 2015-05-24 DIAGNOSIS — J9601 Acute respiratory failure with hypoxia: Secondary | ICD-10-CM

## 2015-05-24 DIAGNOSIS — A419 Sepsis, unspecified organism: Secondary | ICD-10-CM | POA: Diagnosis present

## 2015-05-24 LAB — HCG, SERUM, QUALITATIVE: Preg, Serum: NEGATIVE

## 2015-05-24 LAB — CBC
HEMATOCRIT: 37 % (ref 36.0–46.0)
HEMOGLOBIN: 11.9 g/dL — AB (ref 12.0–15.0)
MCH: 30.4 pg (ref 26.0–34.0)
MCHC: 32.2 g/dL (ref 30.0–36.0)
MCV: 94.6 fL (ref 78.0–100.0)
Platelets: 58 10*3/uL — ABNORMAL LOW (ref 150–400)
RBC: 3.91 MIL/uL (ref 3.87–5.11)
RDW: 16.3 % — ABNORMAL HIGH (ref 11.5–15.5)
WBC: 10.4 10*3/uL (ref 4.0–10.5)

## 2015-05-24 LAB — POCT I-STAT 3, ART BLOOD GAS (G3+)
Acid-base deficit: 10 mmol/L — ABNORMAL HIGH (ref 0.0–2.0)
BICARBONATE: 16.9 meq/L — AB (ref 20.0–24.0)
O2 Saturation: 99 %
TCO2: 18 mmol/L (ref 0–100)
pCO2 arterial: 41.2 mmHg (ref 35.0–45.0)
pH, Arterial: 7.22 — ABNORMAL LOW (ref 7.350–7.450)
pO2, Arterial: 144 mmHg — ABNORMAL HIGH (ref 80.0–100.0)

## 2015-05-24 LAB — BASIC METABOLIC PANEL
Anion gap: 15 (ref 5–15)
BUN: 52 mg/dL — AB (ref 6–20)
CHLORIDE: 103 mmol/L (ref 101–111)
CO2: 16 mmol/L — AB (ref 22–32)
CREATININE: 3.45 mg/dL — AB (ref 0.44–1.00)
Calcium: 7 mg/dL — ABNORMAL LOW (ref 8.9–10.3)
GFR calc non Af Amer: 15 mL/min — ABNORMAL LOW (ref >60)
GFR, EST AFRICAN AMERICAN: 17 mL/min — AB (ref >60)
Glucose, Bld: 151 mg/dL — ABNORMAL HIGH (ref 65–99)
Potassium: 5.2 mmol/L — ABNORMAL HIGH (ref 3.5–5.1)
Sodium: 134 mmol/L — ABNORMAL LOW (ref 135–145)

## 2015-05-24 LAB — MAGNESIUM: Magnesium: 1.6 mg/dL — ABNORMAL LOW (ref 1.7–2.4)

## 2015-05-24 LAB — URINE CULTURE: Culture: NO GROWTH

## 2015-05-24 LAB — GLUCOSE, CAPILLARY
GLUCOSE-CAPILLARY: 125 mg/dL — AB (ref 65–99)
Glucose-Capillary: 129 mg/dL — ABNORMAL HIGH (ref 65–99)

## 2015-05-24 LAB — VANCOMYCIN, RANDOM: Vancomycin Rm: 34 ug/mL

## 2015-05-24 LAB — PHOSPHORUS: PHOSPHORUS: 4.7 mg/dL — AB (ref 2.5–4.6)

## 2015-05-24 MED ORDER — HYDROCORTISONE NA SUCCINATE PF 100 MG IJ SOLR
100.0000 mg | Freq: Three times a day (TID) | INTRAMUSCULAR | Status: DC
  Administered 2015-05-24 – 2015-05-28 (×13): 100 mg via INTRAVENOUS
  Filled 2015-05-24 (×14): qty 2

## 2015-05-24 MED ORDER — PANTOPRAZOLE SODIUM 40 MG IV SOLR
40.0000 mg | INTRAVENOUS | Status: DC
  Administered 2015-05-24 – 2015-06-04 (×12): 40 mg via INTRAVENOUS
  Filled 2015-05-24 (×12): qty 40

## 2015-05-24 MED ORDER — STUDY - INVESTIGATIONAL DRUG SIMPLE RECORD (ML)
2.0000 mL | Freq: Two times a day (BID) | Status: DC
  Administered 2015-05-25 – 2015-05-27 (×6): 2 mL via INTRAVENOUS
  Filled 2015-05-24 (×4): qty 2

## 2015-05-24 MED ORDER — WHITE PETROLATUM GEL
Status: AC
  Administered 2015-05-24: 1
  Filled 2015-05-24: qty 1

## 2015-05-24 MED ORDER — STUDY - INVESTIGATIONAL DRUG SIMPLE RECORD (ML)
1.0000 mL | Freq: Two times a day (BID) | Status: AC
  Administered 2015-05-24: 1 mL via INTRAVENOUS
  Filled 2015-05-24: qty 1

## 2015-05-24 MED ORDER — STUDY - INVESTIGATIONAL DRUG SIMPLE RECORD (ML)
0.5000 mL | Freq: Once | Status: AC
  Administered 2015-05-24: 0.5 mL via INTRAVENOUS
  Filled 2015-05-24: qty 0.5

## 2015-05-24 MED ORDER — SODIUM CHLORIDE 0.9 % IV SOLN
1.0000 g | Freq: Three times a day (TID) | INTRAVENOUS | Status: DC
  Administered 2015-05-24 – 2015-05-25 (×4): 1 g via INTRAVENOUS
  Filled 2015-05-24 (×6): qty 1

## 2015-05-24 MED ORDER — MAGNESIUM SULFATE 2 GM/50ML IV SOLN
2.0000 g | Freq: Once | INTRAVENOUS | Status: AC
  Administered 2015-05-24: 2 g via INTRAVENOUS
  Filled 2015-05-24: qty 50

## 2015-05-24 NOTE — Progress Notes (Addendum)
Pharmacy Antibiotic Note  Marcie MowersSharon Clemon is a 45 y.o. female admitted on 05/23/2015 with AMS, hypotension.  Pharmacy has been consulted for vancomycin/meropenem dosing.  Plan: Continue vancomycin; checking level due to obesity Meropenem 1g q8h F/u renal function, LOT, C&S  Height: 5\' 6"  (167.6 cm) Weight: (!) 786 lb (356.527 kg) IBW/kg (Calculated) : 59.3  Temp (24hrs), Avg:98.2 F (36.8 C), Min:97.5 F (36.4 C), Max:98.6 F (37 C)   Recent Labs Lab 05/23/15 1348 05/24/15 0345  WBC 8.1 10.4  CREATININE 3.42* 3.45*  LATICACIDVEN 4.5*  --     Estimated Creatinine Clearance: 57.9 mL/min (by C-G formula based on Cr of 3.45).    No Known Allergies  Antimicrobials this admission: Zosyn 4/4 >>  Vanc 4/4 x1 >>  Zosyn 4/4 >>4/6  Meropenem 4/6>>  Microbiology results: Blood Duke Salvia([place]) >> GNR - acinetobacter  Abscess cx ([place]) >> skin flora  04/05 blood cx x 2 --> gram neg diplococci  04/05 urine cx --> sent  04/05 wound cx --> sent  Thank you for allowing pharmacy to be a part of this patient's care.  [place] PinkGazda, Madelyne Millikan P, PharmD 05/24/2015 9:44 AM __________________________________________________ ADDENDUM:  04/06 1400 VR level (drawn at 1430) came back at 34.  Plan: No doses of vancomycin today F/u VR with daily labs tomorrow to assess dose and interval

## 2015-05-24 NOTE — Consult Note (Signed)
Reason for Consult: lower extremity wounds  Referring Physician: Dr. Cannon Kettle Dios   HPI: Michele Weber is a 45 year old female transferred from [facility] with mental status changes, lactic acidosis, BCs with GNR, hypotension on levophed and dobutamine.  She has a history of atrial fibrillation and was on Xarelto prior to admission.  She was found to have lower extremity wounds which have been draining.  There is concern for necrotizing fasciitis and therefore we have been asked to evaluate.  The patient is non verbal and therefore unable to provide any history.  History was collected through reviewing EMR.    Weighs 356kg.  LE [place]-- 1. Small superficial fluid collection along the medial left thigh which could reflect that abscess. It measures 2.9 cm greatest dimension but only 6 mm in thickness. 2. No evidence of a right medial thigh abscess/fluid collection. 3. Bilateral medial thigh nonspecific edema.   Past Medical History  Diagnosis Date  . Morbid obesity ([place])   . Lymphedema of both lower extremities   . Diastolic CHF ([place])   . Vitamin D deficiency     History reviewed. No pertinent past surgical history.  Family History  Problem Relation Age of Onset  . Diabetes Father     Social History:  reports that she has never smoked. She does not have any smokeless tobacco history on file. She reports that she does not drink alcohol or use illicit drugs.  Allergies: No Known Allergies  Medications:  Scheduled Meds: . antiseptic oral rinse  7 mL Mouth Rinse q12n4p  . chlorhexidine  15 mL Mouth Rinse BID  . hydrocortisone sod succinate (SOLU-CORTEF) inj  100 mg Intravenous Q8H  . meropenem (MERREM) IV  1 g Intravenous 3 times per day  . pantoprazole (PROTONIX) IV  40 mg Intravenous Q24H  . silver sulfADIAZINE   Topical Daily   Continuous Infusions: . sodium chloride 50 mL/hr at 05/24/15 0700  . norepinephrine (LEVOPHED) Adult infusion 30 mcg/min (05/24/15 0859)  .   sodium bicarbonate  infusion 1000 mL 75 mL/hr at 05/24/15 1219  . vasopressin (PITRESSIN) infusion - *FOR SHOCK* 0.03 Units/min (05/23/15 1930)   PRN Meds:.sodium chloride   Results for orders placed or performed during the hospital encounter of 05/23/15 (from the past 48 hour(s))  MRSA PCR Screening     Status: None   Collection Time: 05/23/15  1:42 PM  Result Value Ref Range   MRSA by PCR NEGATIVE NEGATIVE    Comment:        The GeneXpert MRSA Assay (FDA approved for NASAL specimens only), is one component of a comprehensive MRSA colonization surveillance program. It is not intended to diagnose MRSA infection nor to guide or monitor treatment for MRSA infections.   Comprehensive metabolic panel     Status: Abnormal   Collection Time: 05/23/15  1:48 PM  Result Value Ref Range   Sodium 133 (L) 135 - 145 mmol/L   Potassium 5.0 3.5 - 5.1 mmol/L   Chloride 106 101 - 111 mmol/L   CO2 13 (L) 22 - 32 mmol/L   Glucose, Bld 145 (H) 65 - 99 mg/dL   BUN 47 (H) 6 - 20 mg/dL   Creatinine, Ser 3.42 (H) 0.44 - 1.00 mg/dL   Calcium 7.0 (L) 8.9 - 10.3 mg/dL   Total Protein 5.8 (L) 6.5 - 8.1 g/dL   Albumin 2.0 (L) 3.5 - 5.0 g/dL   AST 87 (H) 15 - 41 U/L   ALT 47 14 -  54 U/L   Alkaline Phosphatase 34 (L) 38 - 126 U/L   Total Bilirubin 1.3 (H) 0.3 - 1.2 mg/dL   GFR calc non Af Amer 15 (L) >60 mL/min   GFR calc Af Amer 18 (L) >60 mL/min    Comment: (NOTE) The eGFR has been calculated using the CKD EPI equation. This calculation has not been validated in all clinical situations. eGFR's persistently <60 mL/min signify possible Chronic Kidney Disease.    Anion gap 14 5 - 15  Magnesium     Status: Abnormal   Collection Time: 05/23/15  1:48 PM  Result Value Ref Range   Magnesium 1.6 (L) 1.7 - 2.4 mg/dL  Phosphorus     Status: Abnormal   Collection Time: 05/23/15  1:48 PM  Result Value Ref Range   Phosphorus 5.0 (H) 2.5 - 4.6 mg/dL  Lactic acid, plasma     Status: Abnormal   Collection  Time: 05/23/15  1:48 PM  Result Value Ref Range   Lactic Acid, Venous 4.5 (HH) 0.5 - 2.0 mmol/L    Comment: CRITICAL RESULT CALLED TO, READ BACK BY AND VERIFIED WITH: Arther Abbott RN [id number] GREEN R   Troponin I     Status: None   Collection Time: 05/23/15  1:48 PM  Result Value Ref Range   Troponin I <0.03 <0.031 ng/mL    Comment:        NO INDICATION OF MYOCARDIAL INJURY.   Cortisol     Status: None   Collection Time: 05/23/15  1:48 PM  Result Value Ref Range   Cortisol, Plasma 44.0 ug/dL    Comment: (NOTE) AM    6.7 - 22.6 ug/dL PM   <10.0       ug/dL   CBC WITH DIFFERENTIAL     Status: Abnormal   Collection Time: 05/23/15  1:48 PM  Result Value Ref Range   WBC 8.1 4.0 - 10.5 K/uL   RBC 3.99 3.87 - 5.11 MIL/uL   Hemoglobin 12.6 12.0 - 15.0 g/dL   HCT 39.2 36.0 - 46.0 %   MCV 98.2 78.0 - 100.0 fL   MCH 31.6 26.0 - 34.0 pg   MCHC 32.1 30.0 - 36.0 g/dL   RDW 16.3 (H) 11.5 - 15.5 %   Platelets 71 (L) 150 - 400 K/uL    Comment: REPEATED TO VERIFY SPECIMEN CHECKED FOR CLOTS PLATELET COUNT CONFIRMED BY SMEAR    Neutrophils Relative % 85 %   Lymphocytes Relative 6 %   Monocytes Relative 9 %   Eosinophils Relative 0 %   Basophils Relative 0 %   Band Neutrophils 0 %   Metamyelocytes Relative 0 %   Myelocytes 0 %   Promyelocytes Absolute 0 %   Blasts 0 %   nRBC 0 0 /100 WBC   Other 0 %   Neutro Abs 6.9 1.7 - 7.7 K/uL   Lymphs Abs 0.5 (L) 0.7 - 4.0 K/uL   Monocytes Absolute 0.7 0.1 - 1.0 K/uL   Eosinophils Absolute 0.0 0.0 - 0.7 K/uL   Basophils Absolute 0.0 0.0 - 0.1 K/uL   RBC Morphology BURR CELLS    WBC Morphology INCREASED BANDS (>20% BANDS)     Comment: MODERATE LEFT SHIFT (>5% METAS AND MYELOS,OCC PRO NOTED)  Glucose, capillary     Status: Abnormal   Collection Time: 05/23/15  1:51 PM  Result Value Ref Range   Glucose-Capillary 125 (H) 65 - 99 mg/dL  Blood gas, arterial  Status: Abnormal   Collection Time: 05/23/15  2:15 PM  Result Value Ref Range    O2 Content 4.0 L/min   Delivery systems NASAL CANNULA    pH, Arterial 7.176 (LL) 7.350 - 7.450    Comment: CRITICAL RESULT CALLED TO, READ BACK BY AND VERIFIED WITH:  MELINDA MACASERO, RN AT [place], RRT ON 05/23/15    pCO2 arterial 36.2 35.0 - 45.0 mmHg   pO2, Arterial 149 (H) 80.0 - 100.0 mmHg   Bicarbonate 12.8 (L) 20.0 - 24.0 mEq/L   TCO2 14.0 0 - 100 mmol/L   Acid-base deficit 13.9 (H) 0.0 - 2.0 mmol/L   O2 Saturation 98.6 %   Patient temperature 98.6    Collection site RIGHT RADIAL    Drawn by [id number]    Sample type ARTERIAL DRAW    Allens test (pass/fail) PASS PASS  Culture, blood (routine x 2)     Status: None (Preliminary result)   Collection Time: 05/23/15  2:53 PM  Result Value Ref Range   Specimen Description BLOOD RIGHT ARM    Special Requests IN PEDIATRIC BOTTLE  3CC    Culture  Setup Time      GRAM NEGATIVE DIPLOCOCCI AEROBIC BOTTLE ONLY CRITICAL RESULT CALLED TO, READ BACK BY AND VERIFIED WITH: C WOFFORD,RN _0  05/24/15 MKELLY CORRECTED RESULTS GRAM NEGATIVE RODS CORRECTED RESULTS CALLED TO: J. SMITH,RN AT 3086 ON [id number] BY Rhea Bleacher    Culture TOO YOUNG TO READ    Report Status PENDING   Culture, blood (routine x 2)     Status: None (Preliminary result)   Collection Time: 05/23/15  3:05 PM  Result Value Ref Range   Specimen Description BLOOD LEFT ARM    Special Requests IN PEDIATRIC BOTTLE 3CC    Culture  Setup Time      GRAM NEGATIVE DIPLOCOCCI AEROBIC BOTTLE ONLY CRITICAL RESULT CALLED TO, READ BACK BY AND VERIFIED WITH: C WOFFORD,RN _1  05/24/15 MKELLY CORRECTED RESULTS GRAM NEGATIVE RODS CORRECTED RESULTS CALLED TO: J. SMITH,RN AT 6295 ON [id number] BY Rhea Bleacher    Culture TOO YOUNG TO READ    Report Status PENDING   Carboxyhemoglobin     Status: None   Collection Time: 05/23/15  3:10 PM  Result Value Ref Range   Total hemoglobin 12.2 12.0 - 16.0 g/dL   O2 Saturation 77.7 %   Carboxyhemoglobin 1.0 0.5 - 1.5 %   Methemoglobin  0.8 0.0 - 1.5 %  Urine culture     Status: None (Preliminary result)   Collection Time: 05/23/15  6:04 PM  Result Value Ref Range   Specimen Description URINE, CATHETERIZED    Special Requests NONE    Culture NO GROWTH < 24 HOURS    Report Status PENDING   Urinalysis, Routine w reflex microscopic (not at [facility])     Status: Abnormal   Collection Time: 05/23/15  6:04 PM  Result Value Ref Range   Color, Urine AMBER (A) YELLOW    Comment: BIOCHEMICALS MAY BE AFFECTED BY COLOR   APPearance TURBID (A) CLEAR   Specific Gravity, Urine 1.020 1.005 - 1.030   pH 5.0 5.0 - 8.0   Glucose, UA NEGATIVE NEGATIVE mg/dL   Hgb urine dipstick LARGE (A) NEGATIVE   Bilirubin Urine SMALL (A) NEGATIVE   Ketones, ur 15 (A) NEGATIVE mg/dL   Protein, ur 100 (A) NEGATIVE mg/dL   Nitrite NEGATIVE NEGATIVE   Leukocytes, UA TRACE (A) NEGATIVE  Wound culture     Status: None (  Preliminary result)   Collection Time: 05/23/15  6:04 PM  Result Value Ref Range   Specimen Description WOUND LEFT THIGH    Special Requests NONE    Gram Stain      NO WBC SEEN NO SQUAMOUS EPITHELIAL CELLS SEEN NO ORGANISMS SEEN Performed at [facility]    Culture PENDING    Report Status PENDING   Urine microscopic-add on     Status: Abnormal   Collection Time: 05/23/15  6:04 PM  Result Value Ref Range   Squamous Epithelial / LPF 0-5 (A) NONE SEEN   WBC, UA 0-5 0 - 5 WBC/hpf   RBC / HPF 6-30 0 - 5 RBC/hpf   Bacteria, UA RARE (A) NONE SEEN   Urine-Other AMORPHOUS URATES/PHOSPHATES   Glucose, capillary     Status: Abnormal   Collection Time: 05/24/15  2:11 AM  Result Value Ref Range   Glucose-Capillary 129 (H) 65 - 99 mg/dL  CBC     Status: Abnormal   Collection Time: 05/24/15  3:45 AM  Result Value Ref Range   WBC 10.4 4.0 - 10.5 K/uL   RBC 3.91 3.87 - 5.11 MIL/uL   Hemoglobin 11.9 (L) 12.0 - 15.0 g/dL   HCT 37.0 36.0 - 46.0 %   MCV 94.6 78.0 - 100.0 fL   MCH 30.4 26.0 - 34.0 pg   MCHC 32.2 30.0 - 36.0 g/dL    RDW 16.3 (H) 11.5 - 15.5 %   Platelets 58 (L) 150 - 400 K/uL    Comment: CONSISTENT WITH PREVIOUS RESULT SPECIMEN CHECKED FOR CLOTS   Basic metabolic panel     Status: Abnormal   Collection Time: 05/24/15  3:45 AM  Result Value Ref Range   Sodium 134 (L) 135 - 145 mmol/L   Potassium 5.2 (H) 3.5 - 5.1 mmol/L   Chloride 103 101 - 111 mmol/L   CO2 16 (L) 22 - 32 mmol/L   Glucose, Bld 151 (H) 65 - 99 mg/dL   BUN 52 (H) 6 - 20 mg/dL   Creatinine, Ser 3.45 (H) 0.44 - 1.00 mg/dL   Calcium 7.0 (L) 8.9 - 10.3 mg/dL   GFR calc non Af Amer 15 (L) >60 mL/min   GFR calc Af Amer 17 (L) >60 mL/min    Comment: (NOTE) The eGFR has been calculated using the CKD EPI equation. This calculation has not been validated in all clinical situations. eGFR's persistently <60 mL/min signify possible Chronic Kidney Disease.    Anion gap 15 5 - 15  Magnesium     Status: Abnormal   Collection Time: 05/24/15  3:45 AM  Result Value Ref Range   Magnesium 1.6 (L) 1.7 - 2.4 mg/dL  Phosphorus     Status: Abnormal   Collection Time: 05/24/15  3:45 AM  Result Value Ref Range   Phosphorus 4.7 (H) 2.5 - 4.6 mg/dL  Glucose, capillary     Status: Abnormal   Collection Time: 05/24/15  8:30 AM  Result Value Ref Range   Glucose-Capillary 125 (H) 65 - 99 mg/dL  I-STAT 3, arterial blood gas (G3+)     Status: Abnormal   Collection Time: 05/24/15 11:39 AM  Result Value Ref Range   pH, Arterial 7.220 (L) 7.350 - 7.450   pCO2 arterial 41.2 35.0 - 45.0 mmHg   pO2, Arterial 144.0 (H) 80.0 - 100.0 mmHg   Bicarbonate 16.9 (L) 20.0 - 24.0 mEq/L   TCO2 18 0 - 100 mmol/L   O2 Saturation 99.0 %  Acid-base deficit 10.0 (H) 0.0 - 2.0 mmol/L   Patient temperature 98.6 F    Collection site RADIAL, ALLEN'S TEST ACCEPTABLE    Drawn by RT    Sample type ARTERIAL     US Abdomen Limited  05/23/2015  CLINICAL DATA:  Abdominal distension, possible ascites or fluid collection. EXAM: LIMITED ABDOMINAL ULTRASOUND COMPARISON:  None.  FINDINGS: Limited ultrasound of the lower abdominal wall right and left lower quadrant demonstrates no evidence of fluid collection. IMPRESSION: No evidence of fluid collection in right and left lower quadrant. Electronically Signed   By: Lahoma Crocker M.D.   On: 05/23/2015 15:27   [place] Extrem Low Bilat Ltd  05/23/2015  CLINICAL DATA:  Swollen thighs with induration. EXAM: BILATERAL LOWER EXTREMITY LIMITED SOFT TISSUE ULTRASOUND TECHNIQUE: Ultrasound examination of the lower extremity soft tissues was performed in the area of clinical concern. COMPARISON:  10/04/2013 FINDINGS: On the right, there is diffuse soft tissue edema, but no focal fluid collection to suggest an abscess. No masses. On the left there is a small superficial fluid collection, which lies along the deep layer of the skin bulging into the underlying superficial subcutaneous fat. It measures 2.5 x 0.6 x 2.9 cm. No other fluid collection. There is diffuse surrounding soft tissue edema. IMPRESSION: 1. Small superficial fluid collection along the medial left thigh which could reflect that abscess. It measures 2.9 cm greatest dimension but only 6 mm in thickness. 2. No evidence of a right medial thigh abscess/fluid collection. 3. Bilateral medial thigh nonspecific edema. Electronically Signed   By: Lajean Manes M.D.   On: 05/23/2015 15:13   Dg Chest Port 1 View  05/24/2015  CLINICAL DATA:  Respiratory failure. EXAM: PORTABLE CHEST 1 VIEW COMPARISON:  05/23/2015 . FINDINGS: Right IJ line stable position. Cardiomegaly with pulmonary vascular prominence and increasing bilateral infiltrates. These findings consistent congestive heart failure. No pleural effusion or pneumothorax. IMPRESSION: 1. Right IJ line stable position. 2. Cardiomegaly with diffuse new onset bilateral pulmonary infiltrates/edema suggesting congestive heart failure. Bilateral pneumonia cannot be excluded . Electronically Signed   By: Marcello Moores  Register   On: 05/24/2015 07:29   Dg Chest  Port 1 View  05/23/2015  CLINICAL DATA:  Respiratory failure EXAM: PORTABLE CHEST 1 VIEW COMPARISON:  May 22, 2015 FINDINGS: Central catheter tip is in the superior vena cava. No other tumor catheter seen. In particular, an endotracheal tube is not appreciable. Note that the patient's chin overlies the apices and may obscure a tube more superiorly. No edema or consolidation. Heart is enlarged with mild pulmonary venous hypertension. No adenopathy evident. Visualized bony structures appear intact. IMPRESSION: Central catheter tip in superior vena cava. No pneumothorax. Cardiomegaly with pulmonary venous hypertension. These findings are indicative of a degree of pulmonary vascular congestion. No frank edema or consolidation. Electronically Signed   By: Lowella Grip III M.D.   On: 05/23/2015 13:45    Review of Systems  Unable to perform ROS  Blood pressure 105/77, pulse 109, temperature 98 F (36.7 C), temperature source Oral, resp. rate 22, height _0  (1.676 m), weight 356.527 kg (786 lb), SpO2 99 %. Physical Exam  Constitutional:  Morbidly obese.  Sitting up in bed.  Not in any acute distress.  Non verbal.   Cardiovascular: Normal rate, regular rhythm, normal heart sounds and intact distal pulses.  Exam reveals no friction rub.   No murmur heard. Respiratory: Effort normal and breath sounds normal. No respiratory distress. She has no wheezes. She has no  rales. She exhibits no tenderness.  GI: Soft. Bowel sounds are normal. There is no tenderness.  Skin:  Lymphedema.  Left thigh discoloration with patches of skin sloughing and these areas a pink.  There is no tenderness, foul odor, induration, cellulitis to suggest necrotizing fasciitis.  There is a large bullae inferior to this.     Assessment/Plan: Lower extremity wounds Lymphedema   I did not appreciate any areas of fluctuance or induration and therefore no role for surgery.  I doubt this is the source of her bacteremia. Signing off.   Please call if any surgical issues arise.    Erby Pian ANP-BC Pager [phone] 05/24/2015, 2:09 PM

## 2015-05-24 NOTE — Research (Signed)
Protocol Title: The MIND-[place] Study Modifying the Impact of ICU-Associated Neurological Dysfunction: A multicenter, doubleblind, randomized, placebo-controlled trial investigating the effects of intravenously (IV) administered typical and atypical antipsychotics (haloperidol and ziprasidone) on delirium in critically ill patients.  This patient, Michele MowersSharon Weber, has been consented to the above clinical trial according to FDA regulations, GCP guidelines and [facility] SOPs. The study design has been explained to this patient's LAR, [name] (daughter) by this RN, and the surrogate demonstrated comprehension. No study procedures have been initiated before consenting of this patient/surrogate. This surrogate was given sufficient time for reading the consent and asking questions. All risks, benefits and options have been thoroughly discussed. This patient/surrogate was not coerced in any way to participate in this clinical trial. The surrogate has signed voluntarily consent version 5.2 at 14:10 on May 24, 2015. This surrogate was given a copy of this consent.  While enrolled in the study and in the ICU, no open label antipsychotic medications can be administered to the patient.    Toula MoosBERION, FRANCES J, RN [phone]       I d/w family. She is a good candidate for MIND [place], qtc less 550, not oversedated. Cam pos, will randomize  Mcarthur RossettiDaniel J. Tyson AliasFeinstein, MD, FACP Pgr: [phone] [facility]

## 2015-05-24 NOTE — Progress Notes (Signed)
CRITICAL VALUE ALERT  Critical value received: Gram Negative Rods  Date of notification: 05/24/15  Time of notification:  0928  Critical value read back: Yes Nurse who received alert: Hazel Samshristian Naveen Lorusso RN  MD notified (1st page):  MD Christene Slatese Dios   Time of first page:  MD on Unit, 1000  MD notified (2nd page):  Time of second page:  Responding MD:    Time MD responded:  1000

## 2015-05-24 NOTE — Progress Notes (Signed)
PCCM INTERVAL PROGRESS NOTE   Called to bedside to assess LLE wounds. Several ulcerated areas to LLE and one large bilster. Worse wound is posterior LLE. Nurses report these have significantly worsened overnight.  Will add image for posterior wounds and blister. Posterior wound picture would not cross over into epic.      Joneen RoachPaul Neymar Dowe, AGACNP-BC [facility] Pager [phone] or [phone]  05/24/2015 6:06 AM

## 2015-05-24 NOTE — Progress Notes (Signed)
PULMONARY / CRITICAL CARE MEDICINE   Name: Michele Weber MRN: [id number] DOB: 07-Jan-1971    ADMISSION DATE:  05/23/2015  REFERRING MD:  Duke Salviaandolph  CHIEF COMPLAINT:  Hypotension  BRIEF Hx of Present Illness-   Morbidly obese 850 lbs presented to [facility] 4/4 withAMS, hypotension , lactic acidosis, elevated pro calcitonin, BC with GNR, draining pannus wounds. Treated empirically with abx and promptly transferred to [facility] 4/5 on levophed at 20 and dobutamine at 55 cc/hr which we will dc until SVO2 is checked. She has a history of A fib on Xarleto but being held as of now. Family consulted and changed her code status to LCB with no intubation, cpr or shock. Abx of Vancomycin and zosyn instituted pan cultured. Her prognosis is poor.  PAST MEDICAL HISTORY :  She  has a past medical history of Morbid obesity (HCC); Lymphedema of both lower extremities; Diastolic CHF (HCC); and Vitamin D deficiency.  PAST SURGICAL HISTORY: She  has no past surgical history on file.  SUBJECTIVE: Patient altered, moaning, been maneovered so that wounds can be cleaned and dressed. Wounds progressed since yesterday. Opening her eyes to voice but not not following commands.  VITAL SIGNS: BP 105/77 mmHg  Pulse 109  Temp(Src) 98 F (36.7 C) (Oral)  Resp 22  Ht 5\' 6"  (1.676 m)  Wt 786 lb (356.527 kg)  BMI 126.92 kg/m2  SpO2 99%  LMP  (LMP Unknown)  HEMODYNAMICS: CVP:  [5 mmHg-12 mmHg] 9 mmHg  VENTILATOR SETTINGS:  Not on Vent.  INTAKE / OUTPUT: I/O last 3 completed shifts: In: 4665.7 [I.V.:4315.7; IV Piggyback:350] Out: 235 [Urine:235]  PHYSICAL EXAMINATION: General: Morbidly obese, moaning intermittently Neuro:  Confused, not following commands HEENT: no neck, oral mucosa dry Cardiovascular: Distant heart sounds Lungs: Decreased air movement Abdomen:  Huge abd ans pannus Musculoskeletal:  Huge pannus on extremities, markedly indurated Skin:left and right pannus with draining wounds,  skin on pannus on bilat lower extremity-thighs, sloughed off, hyperemic wounds, no purulence, hyperpimented  LABS:  BMET  Recent Labs Lab 05/23/15 1348 05/24/15 0345  NA 133* 134*  K 5.0 5.2*  CL 106 103  CO2 13* 16*  BUN 47* 52*  CREATININE 3.42* 3.45*  GLUCOSE 145* 151*    Electrolytes  Recent Labs Lab 05/23/15 1348 05/24/15 0345  CALCIUM 7.0* 7.0*  MG 1.6* 1.6*  PHOS 5.0* 4.7*    CBC  Recent Labs Lab 05/23/15 1348 05/24/15 0345  WBC 8.1 10.4  HGB 12.6 11.9*  HCT 39.2 37.0  PLT 71* 58*    Sepsis Markers  Recent Labs Lab 05/23/15 1348  LATICACIDVEN 4.5*    ABG  Recent Labs Lab 05/23/15 1415 05/24/15 1139  PHART 7.176* 7.220*  PCO2ART 36.2 41.2  PO2ART 149* 144.0*    Liver Enzymes  Recent Labs Lab 05/23/15 1348  AST 87*  ALT 47  ALKPHOS 34*  BILITOT 1.3*  ALBUMIN 2.0*    Cardiac Enzymes  Recent Labs Lab 05/23/15 1348  TROPONINI <0.03    Glucose  Recent Labs Lab 05/23/15 1351 05/24/15 0211 05/24/15 0830  GLUCAP 125* 129* 125*    Imaging [place] Abdomen Limited  05/23/2015  CLINICAL DATA:  Abdominal distension, possible ascites or fluid collection. EXAM: LIMITED ABDOMINAL ULTRASOUND COMPARISON:  None. FINDINGS: Limited ultrasound of the lower abdominal wall right and left lower quadrant demonstrates no evidence of fluid collection. IMPRESSION: No evidence of fluid collection in right and left lower quadrant. Electronically Signed   By: Lanette HampshireLiviu  Pop M.D.  On: 05/23/2015 15:27   [place] Extrem Low Bilat Ltd  05/23/2015  CLINICAL DATA:  Swollen thighs with induration. EXAM: BILATERAL LOWER EXTREMITY LIMITED SOFT TISSUE ULTRASOUND TECHNIQUE: Ultrasound examination of the lower extremity soft tissues was performed in the area of clinical concern. COMPARISON:  10/04/2013 FINDINGS: On the right, there is diffuse soft tissue edema, but no focal fluid collection to suggest an abscess. No masses. On the left there is a small superficial fluid  collection, which lies along the deep layer of the skin bulging into the underlying superficial subcutaneous fat. It measures 2.5 x 0.6 x 2.9 cm. No other fluid collection. There is diffuse surrounding soft tissue edema. IMPRESSION: 1. Small superficial fluid collection along the medial left thigh which could reflect that abscess. It measures 2.9 cm greatest dimension but only 6 mm in thickness. 2. No evidence of a right medial thigh abscess/fluid collection. 3. Bilateral medial thigh nonspecific edema. Electronically Signed   By: Amie Portland M.D.   On: 05/23/2015 15:13   Dg Chest Port 1 View  05/24/2015  CLINICAL DATA:  Respiratory failure. EXAM: PORTABLE CHEST 1 VIEW COMPARISON:  05/23/2015 . FINDINGS: Right IJ line stable position. Cardiomegaly with pulmonary vascular prominence and increasing bilateral infiltrates. These findings consistent congestive heart failure. No pleural effusion or pneumothorax. IMPRESSION: 1. Right IJ line stable position. 2. Cardiomegaly with diffuse new onset bilateral pulmonary infiltrates/edema suggesting congestive heart failure. Bilateral pneumonia cannot be excluded . Electronically Signed   By: Maisie Fus  Register   On: 05/24/2015 07:29    STUDIES:  Chest xray- 4/6- Bilat pulmonary infiltrates- edema, CHF. Bilat PNA cannot be excluded. Abd [place]- 4/5- No evidence of fluid collection in right and left lower quadrant. Bilat Lower extrem US-Small superficial fluid collection along the medial left thigh which could reflect that abscess. It measures 2.9 cm greatest dimension but only 6 mm in thickness. 2. No evidence of a right medial thigh abscess/fluid collection.3. Bilateral medial thigh nonspecific edema.   CULTURES: 4/4- Blood culture results from [facility]- Acinetobacter. 4/5 bc x 2>> Gram negative diplococci>> 4/5 uc>> No growth>>  ANTIBIOTICS: 4/5 zoysn>>4/6 4/5 vanc>> 4/6 Meropenem >>  SIGNIFICANT EVENTS: 4/5 transferred to [facility] 4/5 LCB , no intubation, no  cpr, no shock 4/6 Results of B/c- acinetobacter from [facility]  4/6 Meropenem started  LINES/TUBES: 4/4 left I J cvl>>  DISCUSSION: Super morbidly obese  female(850 lbs) who is a limited code. Been managed for Acinetobacter bacteremia, in septic shock requiring pressors. ABG with Non anion gap, metabolic acidosis- No Azotemia and lactic acidosis and concomittant respiratory acidosis - likely from OHS and OSA.   ASSESSMENT / PLAN:  PULMONARY A: No intubation per code status update 4/5 due obesity  Pulmonary edema Vs Bilat PNA OSA/OHS Metabolic and resp acidosis P:   O2 as needed Partial Code- ACLS meds only Repeat ABG with slight improvement in PH with bicarb infusion Consider Bipap for support  CARDIOVASCULAR A:  Hypotension from presumed shock Atria Fib on ?xalreto at home P:  Pressors as needed Rate control as needed Hold anticoag with low platelets and awaiting surg eval.  RENAL A:   AKI on CKD Metabolic Acidosis Hyperkalemia ATN Likely Oliguric P:   Monitor creatine, K Pressors Limit fluids Bicarb drip  GASTROINTESTINAL A:   Morbidly obese P:   NPO PPI  HEMATOLOGIC A:   On eliquis for chronic afib Thrombocytopenia P:  No anticoagulation till seen by surgery, but also low platelets  INFECTIOUS A:   gnr IN  BLOOD Cellulitis lower panus infections, worsening, concern for nec fascitis, no collection/absess appreciated on [place]. P:   Cont Vanc D/c Zosyn and switch to meropenem to cover acinetobacter Surgical consult  ENDOCRINE A:   Monitor glucose  P:   SSI  NEUROLOGIC A:   AMS/confusion from sepsis and marked acidosis P:   RASS goal: 0 Careful with any sedation in 850 lb person  FAMILY  - Updates:   - Inter-disciplinary family meet or Palliative Care meeting due by:  day 7  Rayna Sexton, MD PGY-3, IMTS. 05/24/2015, 2:22 PM

## 2015-05-24 NOTE — Consult Note (Signed)
WOC wound follow up Wound type: lymphedema with cellulitis and partial thickness skin loss Measurement: see images from CCM in patient chart, unable to measure accurately Wound AVW:UJWJbed:what can be visualized is clean, pink, ruddy.   Drainage (amount, consistency, odor) heavy, serousanginous Periwound: increasing skin sloughing, demarcation marked yesterday.  Dressing procedure/placement/frequency: Continue silvadene as a antimicrobial, no need for topical dressings unless just ABD pads for drainage.   Today her skin is sloughing more and the dark purple induration has extended on the upper portion of the thigh. She has a new large bulla on the inner thigh. Drainage is heavy, but not foul. She does not respond to me, will not follow commands and has this jerking movement when I try to get her to respond.  As well she continues to have blowing movements with her mouth and blowing of secretions at her lips. She will not speak to me, jerking becomes worse when I manipulate the affected leg/thigh. Was at the bedside for board rounds today with CCM.  Antibiotics are being changed based on BC. She continues to have low BP but not as low as when I was at the bedside yesterday.  I have recommend surgery to at least look at the leg because of the worsening of the discoloration of the thigh and the increasing drainage.  I suspect she is not a surgical candidate and most likely would need further workup of the soft tissues in order to rule out any other processes other than cellulitis.  Based on her current status, may need palliative care support. I have ordered her a larger bed to accommodate her girth and weight.   WOC will remain available for support with wound care as needed.   Leonetta Mcgivern [place] RN, [place] [phone]

## 2015-05-25 DIAGNOSIS — J9600 Acute respiratory failure, unspecified whether with hypoxia or hypercapnia: Secondary | ICD-10-CM

## 2015-05-25 DIAGNOSIS — L03116 Cellulitis of left lower limb: Secondary | ICD-10-CM | POA: Insufficient documentation

## 2015-05-25 DIAGNOSIS — A4102 Sepsis due to Methicillin resistant Staphylococcus aureus: Secondary | ICD-10-CM

## 2015-05-25 LAB — BASIC METABOLIC PANEL
Anion gap: 15 (ref 5–15)
BUN: 60 mg/dL — AB (ref 6–20)
CALCIUM: 6.9 mg/dL — AB (ref 8.9–10.3)
CHLORIDE: 99 mmol/L — AB (ref 101–111)
CO2: 19 mmol/L — ABNORMAL LOW (ref 22–32)
CREATININE: 3.84 mg/dL — AB (ref 0.44–1.00)
GFR, EST AFRICAN AMERICAN: 15 mL/min — AB (ref >60)
GFR, EST NON AFRICAN AMERICAN: 13 mL/min — AB (ref >60)
Glucose, Bld: 123 mg/dL — ABNORMAL HIGH (ref 65–99)
Potassium: 5 mmol/L (ref 3.5–5.1)
SODIUM: 133 mmol/L — AB (ref 135–145)

## 2015-05-25 LAB — CBC
HCT: 32.9 % — ABNORMAL LOW (ref 36.0–46.0)
Hemoglobin: 11.1 g/dL — ABNORMAL LOW (ref 12.0–15.0)
MCH: 31.2 pg (ref 26.0–34.0)
MCHC: 33.7 g/dL (ref 30.0–36.0)
MCV: 92.4 fL (ref 78.0–100.0)
PLATELETS: 35 10*3/uL — AB (ref 150–400)
RBC: 3.56 MIL/uL — AB (ref 3.87–5.11)
RDW: 16.2 % — AB (ref 11.5–15.5)
WBC: 14.7 10*3/uL — AB (ref 4.0–10.5)

## 2015-05-25 LAB — VANCOMYCIN, RANDOM: VANCOMYCIN RM: 30 ug/mL

## 2015-05-25 MED ORDER — SODIUM CHLORIDE 0.9 % IV SOLN
1.0000 g | Freq: Two times a day (BID) | INTRAVENOUS | Status: DC
  Administered 2015-05-25 – 2015-05-28 (×6): 1 g via INTRAVENOUS
  Filled 2015-05-25 (×7): qty 1

## 2015-05-25 MED ORDER — FENTANYL CITRATE (PF) 100 MCG/2ML IJ SOLN
25.0000 ug | INTRAMUSCULAR | Status: DC | PRN
  Administered 2015-05-25 – 2015-05-26 (×4): 25 ug via INTRAVENOUS
  Filled 2015-05-25 (×4): qty 2

## 2015-05-25 NOTE — Progress Notes (Signed)
Nutrition Brief Note  Chart reviewed due to low Braden score. Palliative Care team is following for EOL care. Daughter wants patient to be comfortable. No nutrition interventions warranted at this time.  Please consult as needed.   Joaquin CourtsKimberly Zev Blue, RD, LDN, CNSC Pager [phone] After Hours Pager [phone]

## 2015-05-25 NOTE — Care Management Important Message (Signed)
Important Message  Patient Details  Name: Michele MowersSharon Icenhour MRN: [id number] Date of Birth: Jul 18, 1970   Medicare Important Message Given:  Yes    Sacred Roa Abena 05/25/2015, 11:04 AM

## 2015-05-25 NOTE — Progress Notes (Signed)
ANTIBIOTIC CONSULT NOTE - FOLLOW UP  Pharmacy Consult:  Vancomycin / Merrem Indication:  Bacteremia / sepsis  No Known Allergies  Patient Measurements: Height: 5\' 6"  (167.6 cm) Weight:  (scale reading 574 lbs) IBW/kg (Calculated) : 59.3  Vital Signs: Temp: 98.4 F (36.9 C) (04/07 0810) Temp Source: Oral (04/07 0810) BP: 97/64 mmHg (04/07 0800) Pulse Rate: 109 (04/07 0800) Intake/Output from previous day: 04/06 0701 - 04/07 0700 In: 4353.4 [I.V.:4003.4; IV Piggyback:350] Out: 95 [Urine:95]  Labs:  Recent Labs  05/23/15 1348 05/24/15 0345 05/25/15 0521  WBC 8.1 10.4 14.7*  HGB 12.6 11.9* 11.1*  PLT 71* 58* 35*  CREATININE 3.42* 3.Michele* 3.84*   Estimated Creatinine Clearance: 52 mL/min (by C-G formula based on Cr of 3.84).  Recent Labs  05/24/15 1430 05/25/15 0521  VANCORANDOM 34 30     Microbiology: Recent Results (from the past 720 hour(s))  MRSA PCR Screening     Status: None   Collection Time: 05/23/15  1:42 PM  Result Value Ref Range Status   MRSA by PCR NEGATIVE NEGATIVE Final    Comment:        The GeneXpert MRSA Assay (FDA approved for NASAL specimens only), is one component of a comprehensive MRSA colonization surveillance program. It is not intended to diagnose MRSA infection nor to guide or monitor treatment for MRSA infections.   Culture, blood (routine x 2)     Status: None (Preliminary result)   Collection Time: 05/23/15  2:53 PM  Result Value Ref Range Status   Specimen Description BLOOD RIGHT ARM  Final   Special Requests IN PEDIATRIC BOTTLE  3CC  Final   Culture  Setup Time   Final    GRAM NEGATIVE DIPLOCOCCI AEROBIC BOTTLE ONLY CRITICAL RESULT CALLED TO, READ BACK BY AND VERIFIED WITH: C WOFFORD,RN @0415  05/24/15 MKELLY CORRECTED RESULTS GRAM NEGATIVE RODS CORRECTED RESULTS CALLED TO: J. SMITH,RN AT 0928 ON [id number] BY Lucienne CapersS. YARBROUGH    Culture TOO YOUNG TO READ  Final   Report Status PENDING  Incomplete  Culture, blood (routine x 2)      Status: None (Preliminary result)   Collection Time: 05/23/15  3:05 PM  Result Value Ref Range Status   Specimen Description BLOOD LEFT ARM  Final   Special Requests IN PEDIATRIC BOTTLE 3CC  Final   Culture  Setup Time   Final    GRAM NEGATIVE DIPLOCOCCI AEROBIC BOTTLE ONLY CRITICAL RESULT CALLED TO, READ BACK BY AND VERIFIED WITH: C WOFFORD,RN @0415  05/24/15 MKELLY CORRECTED RESULTS GRAM NEGATIVE RODS CORRECTED RESULTS CALLED TO: J. SMITH,RN AT 0928 ON [id number] BY Lucienne CapersS. YARBROUGH    Culture TOO YOUNG TO READ  Final   Report Status PENDING  Incomplete  Urine culture     Status: None   Collection Time: 05/23/15  6:04 PM  Result Value Ref Range Status   Specimen Description URINE, CATHETERIZED  Final   Special Requests NONE  Final   Culture NO GROWTH 1 DAY  Final   Report Status 05/24/2015 FINAL  Final  Wound culture     Status: None (Preliminary result)   Collection Time: 05/23/15  6:04 PM  Result Value Ref Range Status   Specimen Description WOUND LEFT THIGH  Final   Special Requests NONE  Final   Gram Stain   Final    NO WBC SEEN NO SQUAMOUS EPITHELIAL CELLS SEEN NO ORGANISMS SEEN Performed at [facility]    Culture   Final    Hosie PoissonABUNDANT GRAM  NEGATIVE RODS Performed at [facility]    Report Status PENDING  Incomplete     Assessment: Michele Weber continues on vancomycin and meropenem for sepsis and Acinetobacter bacteremia.  Patient's renal function is worsening.  Vanc 4/4 >> 4/7 Zosyn 4/4 >> 4/6 Merrem 4/6 >>  4/6 VR = 34 mcg/mL (post  on 4/5 at 1500) 4/7 VR = 30 mcg/mL  Blood ([place]) - Acinetobacter (sensitive to Zosyn, Merrem) Abscess cx ([place]) - negative 4/5 BCx x2 - GN diplococci 4/5 UCx - negative 4/5 left thigh wound - GNR   Goal of Therapy:  Resolution of infection   Plan:  - Change Merrem to 1gm IV Q12H - Monitor renal fxn, micro data - F/U resume AC    Corinna Burkman D. Laney Potash, PharmD, BCPS Pager:  [phone] 05/25/2015, 12:54  PM

## 2015-05-25 NOTE — Research (Addendum)
Protocol Title: The MIND-USA Study Modifying the Impact of ICU-Associated Neurological Dysfunction: A multicenter, doubleblind, randomized, placebo-controlled trial investigating the effects of intravenously (IV) administered typical and atypical antipsychotics (haloperidol and ziprasidone) on delirium in critically ill patients.  Spoke with Dr. Rory Percyaniel Makyra Corprew regarding patient status and goals of care. Patient has continued to exhibit upper extremity jerking/body movements since prior to admission of study drug. From a clinical standpoint patient has been tolerating study drug well and according to bedside RN study drug has had a "calming effect" on patient after administration. Michele AliasFeinstein, MD agrees to continue study drug at this point as there are no contraindications and patient has passed all safety assessments conducted as part of study protocol prior to drug administration. Will continue to follow. Please call Dr. Tyson Weber with any questions or concerns.  Michele Weber, Michele Weber   Movements were present and observed BY ME as well prior to drug. They DO NOT remind me of any dyskinetic movements. No major fevers etc They ar enot changed in severity Continued current drug seems to be improving with therapy   Michele Rossettianiel Weber. Michele AliasFeinstein, MD, FACP Pgr: [phone] [facility]

## 2015-05-25 NOTE — Consult Note (Signed)
Consultation Note Date: 05/25/2015   Patient Name: Michele Weber  DOB: [birth year]  MRN: [id number]  Age / Sex: 45 y.o., female  PCP: Cathren Harsh, MD Referring Physician: Louann Sjogren, MD  Reason for Consultation: Establishing goals of care, Psychosocial/spiritual support, Terminal Care and Withdrawal of life-sustaining treatment    Clinical Assessment/Narrative: Patient is a 45 year old female admitted with septic shock from a skilled nursing facility. She has bilateral lower extremity cellulitis and now with the acinetobacter bacteremia. She is also in cardiac and renal failure. Patient has been chronically ill for a number of years. She weighs 750 pounds, and up until year ago was living at home although very sedentary. She was verbal before being admitted to the hospital but is now nonverbal. She also is exhibiting upper extremity abnormal body movements where she jerks. She does occasionally say a word or 2 but mostly this morning she is saying oh God oh God. Her affect is anxious.  Contacts/Participants in Discussion: Primary Decision Maker:[name] and Travis Relationship to Patient children HCPOA: no  There is not a designated healthcare power of attorney. Patient is divorced. She has 2 children as referenced above. She is one of 13 children.  SUMMARY OF RECOMMENDATIONS Family recognizes the patient is nearing end-of-life I did give them a prognosis of hours to days As noted she comes from a very large family. Their hope is that people can come up to the hospital over the weekend and say goodbye. Her daughter feels like she is uncomfortable in pain and does not wish to see her this way and is requesting pain medicine. I did explain how fragile patient is in terms of maintaining her blood pressure, that she is currently on pressors. She verbalized understanding but does want her mother comfortable. It  is not anticipated that she will survive long off of pressors but continue this through the weekend as family is desiring additional time for family to come in and pay their respects Continue antibiotics for now  Code Status/Advance Care Planning: Limited code    Code Status Orders        Start     Ordered   05/23/15 1241  Limited resuscitation (code)   Continuous    Question Answer Comment  In the event of cardiac or respiratory ARREST: Initiate Code Blue, Call Rapid Response Yes   In the event of cardiac or respiratory ARREST: Perform CPR No   In the event of cardiac or respiratory ARREST: Perform Intubation/Mechanical Ventilation No   In the event of cardiac or respiratory ARREST: Use NIPPV/BiPAp only if indicated Yes   In the event of cardiac or respiratory ARREST: Administer ACLS medications if indicated Yes   In the event of cardiac or respiratory ARREST: Perform Defibrillation or Cardioversion if indicated No      05/23/15 1241    Code Status History    Date Active Date Inactive Code Status Order ID Comments User Context   09/30/2013  6:03 PM 10/04/2013 10:01 PM Full Code [id number]  Ripudeep Jenna Luo, MD Inpatient   08/11/2013  3:10 AM 08/15/2013  5:09 PM Full Code [id number]  Lynden Oxford, MD Inpatient      Other Directives:None  Symptom Management:   Pain: After meeting with the family, last CCM to address low-dose pain medicine in the hopes of making patient more comfortable but also not conflicting with family's goal to prolong life over the next 24-48 hours  Palliative Prophylaxis:   Bowel Regimen, Delirium Protocol,  Frequent Pain Assessment, Oral Care and Palliative Wound Care  Additional Recommendations (Limitations, Scope, Preferences):  No Chemotherapy, No Hemodialysis, No Radiation, No Surgical Procedures and No Tracheostomy    Psycho-social/Spiritual:  Support System: Strong Desire for further Chaplaincy support:no Additional Recommendations:  Grief/Bereavement Support  Prognosis: Hours - Days  Discharge Planning: Anticipate hospital death wants pressors are discontinued   Chief Complaint/ Primary Diagnoses: Present on Admission:  . Sepsis (HCC)  I have reviewed the medical record, interviewed the patient and family, and examined the patient. The following aspects are pertinent.  Past Medical History  Diagnosis Date  . Morbid obesity (HCC)   . Lymphedema of both lower extremities   . Diastolic CHF (HCC)   . Vitamin D deficiency    Social History   Social History  . Marital Status: Divorced    Spouse Name: N/A  . Number of Children: N/A  . Years of Education: N/A   Social History Main Topics  . Smoking status: Never Smoker   . Smokeless tobacco: None  . Alcohol Use: No  . Drug Use: No  . Sexual Activity: Not Currently   Other Topics Concern  . None   Social History Narrative   Family History  Problem Relation Age of Onset  . Diabetes Father    Scheduled Meds: . antiseptic oral rinse  7 mL Mouth Rinse q12n4p  . chlorhexidine  15 mL Mouth Rinse BID  . hydrocortisone sod succinate (SOLU-CORTEF) inj  100 mg Intravenous Q8H  . meropenem (MERREM) IV  1 g Intravenous Q12H  . research study medication  2 mL Intravenous Q12H  . pantoprazole (PROTONIX) IV  40 mg Intravenous Q24H  . silver sulfADIAZINE   Topical Daily   Continuous Infusions: . norepinephrine (LEVOPHED) Adult infusion 12 mcg/min (05/25/15 0700)  .  sodium bicarbonate  infusion 1000 mL 50 mL/hr at 05/25/15 1032  . vasopressin (PITRESSIN) infusion - *FOR SHOCK* 0.03 Units/min (05/25/15 0700)   PRN Meds:.sodium chloride, fentaNYL (SUBLIMAZE) injection Medications Prior to Admission:  Prior to Admission medications   Medication Sig Start Date End Date Taking? Authorizing Provider  acetaminophen (TYLENOL 8 HOUR) 650 MG CR tablet Take 650 mg by mouth every 8 (eight) hours as needed for pain.    Historical Provider, MD  Amino Acids-Protein  Hydrolys (FEEDING SUPPLEMENT, PRO-STAT SUGAR FREE 64,) LIQD Take 30 mLs by mouth 3 (three) times daily with meals. 10/04/13   Calvert Cantor, MD  amiodarone (PACERONE) 200 MG tablet Take 1 tablet (200 mg total) by mouth 2 (two) times daily. 10/04/13   Calvert Cantor, MD  ceFAZolin (ANCEF) 2-3 GM-% SOLR Inject 50 mLs (2 g total) into the vein every 8 (eight) hours. 10/04/13   Calvert Cantor, MD  diclofenac sodium (VOLTAREN) 1 % GEL Apply 2 g topically 4 (four) times daily as needed (for knee pain).     Historical Provider, MD  diltiazem (CARDIZEM CD) 180 MG 24 hr capsule Take 1 capsule (180 mg total) by mouth daily. 08/15/13   Christiane Ha, MD  furosemide (LASIX) 40 MG tablet Take 60 mg by mouth 2 (two) times daily.     Historical Provider, MD  metoprolol tartrate (LOPRESSOR) 25 MG tablet Take 0.5 tablets (12.5 mg total) by mouth 2 (two) times daily. 10/04/13   Calvert Cantor, MD  OVER THE COUNTER MEDICATION Inhale 1 application into the lungs at bedtime. CPAP-at bedtime    Historical Provider, MD  potassium chloride SA (K-DUR,KLOR-CON) 20 MEQ tablet Take 40 mEq by mouth  2 (two) times daily.    Historical Provider, MD  rivaroxaban (XARELTO) 20 MG TABS tablet Take 1 tablet (20 mg total) by mouth daily with supper. 08/15/13   Christiane Ha, MD  vitamin C (ASCORBIC ACID) 500 MG tablet Take 500 mg by mouth 2 (two) times daily.    Historical Provider, MD   No Known Allergies  Review of Systems  Unable to perform ROS: Acuity of condition    Physical Exam  Nursing note and vitals reviewed. Constitutional:  Morbidly obese Acutely ill appearing female  HENT:  Head: Normocephalic and atraumatic.  Respiratory:  Increased work of breathing  Neurological:  Abnormal movements of upper extremities Non verbal   Skin:  cool  Psychiatric:  Affect anxious    Vital Signs: BP 97/64 mmHg  Pulse 109  Temp(Src) 98.6 F (37 C) (Oral)  Resp 34  Ht  (1.676 m)  Wt 356.527 kg (786 lb)  BMI 126.92  kg/m2  SpO2 98%  LMP  (LMP Unknown)  SpO2: SpO2: 98 % O2 Device:SpO2: 98 % O2 Flow Rate: .O2 Flow Rate (L/min): 2 L/min  IO: Intake/output summary:  Intake/Output Summary (Last 24 hours) at 05/25/15 1258 Last data filed at 05/25/15 0700  Gross per 24 hour  Intake 3080.5 ml  Output     95 ml  Net 2985.5 ml    LBM: Last BM Date: 05/25/15 Baseline Weight: Weight: (!) 363 kg (800 lb 4.3 oz) Most recent weight: Weight:  (scale reading 574 lbs)      Palliative Assessment/Data:  Flowsheet Rows        Most Recent Value   Intake Tab    Referral Department  Hospitalist   Unit at Time of Referral  ICU   Palliative Care Primary Diagnosis  Sepsis/Infectious Disease   Date Notified  05/24/15   Palliative Care Type  New Palliative care   Reason for referral  Clarify Goals of Care   Date of Admission  05/19/15   Date first seen by Palliative Care  05/25/15   # of days Palliative referral response time  1 Day(s)   # of days IP prior to Palliative referral  5   Clinical Assessment    Palliative Performance Scale Score  20%   Pain Max last 24 hours  Not able to report   Pain Min Last 24 hours  Not able to report   Dyspnea Max Last 24 Hours  Not able to report   Dyspnea Min Last 24 hours  Not able to report   Nausea Max Last 24 Hours  Not able to report   Nausea Min Last 24 Hours  Not able to report   Anxiety Max Last 24 Hours  Not able to report   Anxiety Min Last 24 Hours  Not able to report   Other Max Last 24 Hours  Not able to report   Psychosocial & Spiritual Assessment    Palliative Care Outcomes    Patient/Family meeting held?  Yes   Who was at the meeting?  daughter, pt's sister   Palliative Care follow-up planned  Yes, Facility      Additional Data Reviewed:  CBC:    Component Value Date/Time   WBC 14.7* 05/25/2015 0521   WBC 7.2 05/13/2013   HGB 11.1* 05/25/2015 0521   HCT 32.9* 05/25/2015 0521   PLT 35* 05/25/2015 0521   MCV 92.4 05/25/2015 0521   NEUTROABS  6.9 05/23/2015 1348   LYMPHSABS 0.5* 05/23/2015 1348  MONOABS 0.7 05/23/2015 1348   EOSABS 0.0 05/23/2015 1348   BASOSABS 0.0 05/23/2015 1348   Comprehensive Metabolic Panel:    Component Value Date/Time   NA 133* 05/25/2015 0521   NA 136* 05/13/2013   K 5.0 05/25/2015 0521   CL 99* 05/25/2015 0521   CO2 19* 05/25/2015 0521   BUN 60* 05/25/2015 0521   BUN 11 05/13/2013   CREATININE 3.84* 05/25/2015 0521   CREATININE 1.0 05/13/2013   GLUCOSE 123* 05/25/2015 0521   CALCIUM 6.9* 05/25/2015 0521   AST 87* 05/23/2015 1348   ALT 47 05/23/2015 1348   ALKPHOS 34* 05/23/2015 1348   BILITOT 1.3* 05/23/2015 1348   PROT 5.8* 05/23/2015 1348   ALBUMIN 2.0* 05/23/2015 1348     Time In: 1100 Time Out: 1230 Time Total: 90 min Greater than 50%  of this time was spent counseling and coordinating care related to the above assessment and plan. Staffed with Dr. Christene Slatese Dios  Signed by: Irean HongSarah Grace Tabetha Haraway, NP  Irean HongSarah Grace Domani Bakos, NP  05/25/2015, 12:58 PM  Please contact Palliative Medicine Team phone at [phone] for questions and concerns.

## 2015-05-25 NOTE — Progress Notes (Signed)
PULMONARY / CRITICAL CARE MEDICINE   Name: Michele Weber MRN: [id number] DOB: Jan 20, 1971    ADMISSION DATE:  05/23/2015  REFERRING MD:  Duke Salvia  CHIEF COMPLAINT:  Hypotension  BRIEF Hx of Present Illness-   Morbidly obese 850 lbs presented to [facility] 4/4 withAMS, hypotension , lactic acidosis, elevated pro calcitonin, BC with GNR, draining pannus wounds. Treated empirically with abx and promptly transferred to [facility] 4/5 on levophed at 20 and dobutamine at 55 cc/hr which we will dc until SVO2 is checked. She has a history of A fib on Xarleto but being held as of now. Family consulted and changed her code status to LCB with no intubation, cpr or shock. Abx of Vancomycin and zosyn instituted pan cultured. Her prognosis is poor.  PAST MEDICAL HISTORY :  She  has a past medical history of Morbid obesity (HCC); Lymphedema of both lower extremities; Diastolic CHF (HCC); and Vitamin D deficiency.  PAST SURGICAL HISTORY: She  has no past surgical history on file.  SUBJECTIVE: Patient is awake, opens eyes to voice. Cannot tell where me her name. Moans intermittently.  VITAL SIGNS: BP 97/64 mmHg  Pulse 109  Temp(Src) 98.4 F (36.9 C) (Oral)  Resp 34  Ht  (1.676 m)  Wt 786 lb (356.527 kg)  BMI 126.92 kg/m2  SpO2 98%  LMP  (LMP Unknown)  HEMODYNAMICS: CVP:  [7 mmHg-15 mmHg] 15 mmHg  VENTILATOR SETTINGS:  Not on Vent.  INTAKE / OUTPUT: I/O last 3 completed shifts: In: 7074.9 [I.V.:6674.9; IV Piggyback:400] Out: 320 [Urine:320]  PHYSICAL EXAMINATION: General: Morbidly obese, not following commands Neuro:  Awake. HEENT: no neck, oral mucosa appears dry Cardiovascular: Distant heart sounds Lungs: Decreased air movement Abdomen:  Huge abd pannus, not tender Musculoskeletal:  Huge pannus on extremities, markedly indurated Skin:left and right pannus with draining wounds, skin on pannus on bilat lower extremity-thighs, sloughed off, hyperemic wounds , no  purulence, hyperpimented pannus bilat  LABS:  BMET  Recent Labs Lab 05/23/15 1348 05/24/15 0345 05/25/15 0521  NA 133* 134* 133*  K 5.0 5.2* 5.0  CL 106 103 99*  CO2 13* 16* 19*  BUN 47* 52* 60*  CREATININE 3.42* 3.45* 3.84*  GLUCOSE 145* 151* 123*    Electrolytes  Recent Labs Lab 05/23/15 1348 05/24/15 0345 05/25/15 0521  CALCIUM 7.0* 7.0* 6.9*  MG 1.6* 1.6*  --   PHOS 5.0* 4.7*  --     CBC  Recent Labs Lab 05/23/15 1348 05/24/15 0345 05/25/15 0521  WBC 8.1 10.4 14.7*  HGB 12.6 11.9* 11.1*  HCT 39.2 37.0 32.9*  PLT 71* 58* 35*    Sepsis Markers  Recent Labs Lab 05/23/15 1348  LATICACIDVEN 4.5*    ABG  Recent Labs Lab 05/23/15 1415 05/24/15 1139  PHART 7.176* 7.220*  PCO2ART 36.2 41.2  PO2ART 149* 144.0*    Liver Enzymes  Recent Labs Lab 05/23/15 1348  AST 87*  ALT 47  ALKPHOS 34*  BILITOT 1.3*  ALBUMIN 2.0*    Cardiac Enzymes  Recent Labs Lab 05/23/15 1348  TROPONINI <0.03    Glucose  Recent Labs Lab 05/23/15 1351 05/24/15 0211 05/24/15 0830  GLUCAP 125* 129* 125*    Imaging No results found.  STUDIES:  Chest xray- 4/6- Bilat pulmonary infiltrates- edema, CHF. Bilat PNA cannot be excluded. Abd [place]- 4/5- No evidence of fluid collection in right and left lower quadrant. Bilat Lower extrem US-Small superficial fluid collection along the medial left thigh which could reflect that  abscess. It measures 2.9 cm greatest dimension but only 6 mm in thickness. 2. No evidence of a right medial thigh abscess/fluid collection.3. Bilateral medial thigh nonspecific edema.   CULTURES: 4/4- [facility] Acinetobacter. 4/5 bc x 2>> Gram negative diplococci>> 4/5 uc>> No growth - Final 4/5 Wound Cultures >> Abundant Gram neg rods >>  ANTIBIOTICS: 4/5 zoysn>>4/6 4/5 vanc>> 4/7 4/6 Meropenem >>  SIGNIFICANT EVENTS: 4/5 transferred to [facility] 4/5 LCB , no intubation, no cpr, no shock 4/6 Results of B/c-  acinetobacter from [place]  4/6 Meropenem started  LINES/TUBES: 4/4 left I J cvl>>  DISCUSSION: Super morbidly obese  female(850 lbs) who is a limited code. Been managed for Acinetobacter bacteremia, in septic shock requiring pressors. ABG with Non anion gap, metabolic acidosis- No Azotemia, has a lactic acidosis and concomittant respiratory acidosis - likely from OHS and OSA.   ASSESSMENT / PLAN:  PULMONARY A: No intubation per code status update 4/5 due obesity  Pulmonary edema Vs Bilat PNA OSA/OHS Metabolic and resp acidosis P:   O2 as needed on nasal canula Partial Code- ACLS meds only Repeat ABG with slight improvement in PH with bicarb infusion Consider Bipap for support  CARDIOVASCULAR A:  Hypotension from presumed shock Atria Fib on ?xalreto at home P:  Pressors as needed Rate control as needed Hold anticoag with low platelets  RENAL A:   Worsening AKI on CKD Metabolic Acidosis Hyperkalemia ATN Likely Oliguric P:   Monitor creatine, K Pressors Limit fluids Bicarb drip reduce rate Stop IVF  GASTROINTESTINAL A:   Morbidly obese P:   NPO PPI  HEMATOLOGIC A:   On eliquis for chronic afib Thrombocytopenia P:  No anticoagulation till seen by surgery, but also low platelets  INFECTIOUS A:   gnr IN BLOOD Cellulitis lower panus infections, worsening, concern for nec fascitis, no collection/absess appreciated on US. P:   Cont Vanc D/c Zosyn and switch to meropenem to cover acinetobacter Surgical consult- impression- not nec fascitis, no indication for surgery.  ENDOCRINE A:   Monitor glucose  P:   SSI  NEUROLOGIC A:   AMS/confusion from sepsis and marked acidosis P:   RASS goal: 0 Careful with any sedation in 850 lb person On MIND-[place] Study Research Start fentanyl- Family wants patient to be comfortable.  FAMILY  - Updates: Palliative care to see pt and family.   - Inter-disciplinary family meet or Palliative Care meeting due by:   day 7  Rayna SextonEjiro Emokape, MD PGY-3, IMTS. 05/25/2015, 11:01 AM

## 2015-05-26 DIAGNOSIS — Z515 Encounter for palliative care: Secondary | ICD-10-CM

## 2015-05-26 LAB — CULTURE, BLOOD (ROUTINE X 2)

## 2015-05-26 LAB — BASIC METABOLIC PANEL
Anion gap: 15 (ref 5–15)
BUN: 64 mg/dL — AB (ref 6–20)
CHLORIDE: 101 mmol/L (ref 101–111)
CO2: 21 mmol/L — AB (ref 22–32)
CREATININE: 4.18 mg/dL — AB (ref 0.44–1.00)
Calcium: 6.9 mg/dL — ABNORMAL LOW (ref 8.9–10.3)
GFR calc Af Amer: 14 mL/min — ABNORMAL LOW (ref >60)
GFR calc non Af Amer: 12 mL/min — ABNORMAL LOW (ref >60)
Glucose, Bld: 139 mg/dL — ABNORMAL HIGH (ref 65–99)
POTASSIUM: 5 mmol/L (ref 3.5–5.1)
Sodium: 137 mmol/L (ref 135–145)

## 2015-05-26 LAB — CBC
HEMATOCRIT: 30.8 % — AB (ref 36.0–46.0)
Hemoglobin: 10.4 g/dL — ABNORMAL LOW (ref 12.0–15.0)
MCH: 30 pg (ref 26.0–34.0)
MCHC: 33.8 g/dL (ref 30.0–36.0)
MCV: 88.8 fL (ref 78.0–100.0)
PLATELETS: 48 10*3/uL — AB (ref 150–400)
RBC: 3.47 MIL/uL — AB (ref 3.87–5.11)
RDW: 16 % — AB (ref 11.5–15.5)
WBC: 28.9 10*3/uL — ABNORMAL HIGH (ref 4.0–10.5)

## 2015-05-26 LAB — PHOSPHORUS: Phosphorus: 4.5 mg/dL (ref 2.5–4.6)

## 2015-05-26 LAB — WOUND CULTURE: Gram Stain: NONE SEEN

## 2015-05-26 LAB — MAGNESIUM: Magnesium: 2 mg/dL (ref 1.7–2.4)

## 2015-05-26 MED ORDER — FENTANYL CITRATE (PF) 100 MCG/2ML IJ SOLN
25.0000 ug | INTRAMUSCULAR | Status: DC | PRN
  Administered 2015-05-26 – 2015-05-27 (×3): 50 ug via INTRAVENOUS
  Administered 2015-05-31: 25 ug via INTRAVENOUS
  Administered 2015-06-01 (×5): 50 ug via INTRAVENOUS
  Administered 2015-06-02: 25 ug via INTRAVENOUS
  Administered 2015-06-02 – 2015-06-06 (×20): 50 ug via INTRAVENOUS
  Administered 2015-06-07: 25 ug via INTRAVENOUS
  Administered 2015-06-07 – 2015-06-08 (×3): 50 ug via INTRAVENOUS
  Filled 2015-05-26 (×36): qty 2

## 2015-05-26 NOTE — Progress Notes (Signed)
PULMONARY / CRITICAL CARE MEDICINE   Name: Michele Weber MRN: [id number] DOB: [birth year]    ADMISSION DATE:  05/23/2015  REFERRING MD:  Duke Salvia  CHIEF COMPLAINT:  Hypotension  BRIEF Hx of Present Illness-   Morbidly obese 850 lbs presented to [facility] 4/4 withAMS, hypotension , lactic acidosis, elevated pro calcitonin, BC with GNR, draining pannus wounds. Treated empirically with abx and promptly transferred to [facility] 4/5 on levophed at 20 and dobutamine at 55 cc/hr which we will dc until SVO2 is checked. She has a history of A fib on Xarleto but being held as of now. Family consulted and changed her code status to LCB with no intubation, cpr or shock. Abx of Vancomycin and zosyn instituted pan cultured. Her prognosis is poor.   SUBJECTIVE: Patient is awake, opens eyes to voice. Remains SOB. Occasional jerking  VITAL SIGNS: BP 93/62 mmHg  Pulse 125  Temp(Src) 98.1 F (36.7 C) (Oral)  Resp 19  Ht  (1.676 m)  Wt 718 lb (325.683 kg)  BMI 115.94 kg/m2  SpO2 98%  LMP  (LMP Unknown)  HEMODYNAMICS:    VENTILATOR SETTINGS:  Not on Vent.  INTAKE / OUTPUT: I/O last 3 completed shifts: In: 3952.1 [I.V.:3652.1; IV Piggyback:300] Out: 575 [Urine:575]  PHYSICAL EXAMINATION: General: Morbidly obese, occasionally following commands Neuro:  Awake. confused HEENT: no neck, oral mucosa appears dry Cardiovascular: Distant heart sounds Lungs: Decreased air movement. Crackles at bases Abdomen:  Huge abd pannus, not tender Musculoskeletal:  Huge pannus on extremities, markedly indurated Skin:left and right pannus with draining wounds, skin on pannus on bilat lower extremity-thighs, sloughed off, hyperemic wounds , no purulence, hyperpimented pannus bilat  LABS:  BMET  Recent Labs Lab 05/24/15 0345 05/25/15 0521 05/26/15 0415  NA 134* 133* 137  K 5.2* 5.0 5.0  CL 103 99* 101  CO2 16* 19* 21*  BUN 52* 60* 64*  CREATININE 3.45* 3.84* 4.18*  GLUCOSE 151* 123*  139*    Electrolytes  Recent Labs Lab 05/23/15 1348 05/24/15 0345 05/25/15 0521 05/26/15 0415  CALCIUM 7.0* 7.0* 6.9* 6.9*  MG 1.6* 1.6*  --  2.0  PHOS 5.0* 4.7*  --  4.5    CBC  Recent Labs Lab 05/24/15 0345 05/25/15 0521 05/26/15 0415  WBC 10.4 14.7* 28.9*  HGB 11.9* 11.1* 10.4*  HCT 37.0 32.9* 30.8*  PLT 58* 35* 48*    Sepsis Markers  Recent Labs Lab 05/23/15 1348  LATICACIDVEN 4.5*    ABG  Recent Labs Lab 05/23/15 1415 05/24/15 1139  PHART 7.176* 7.220*  PCO2ART 36.2 41.2  PO2ART 149* 144.0*    Liver Enzymes  Recent Labs Lab 05/23/15 1348  AST 87*  ALT 47  ALKPHOS 34*  BILITOT 1.3*  ALBUMIN 2.0*    Cardiac Enzymes  Recent Labs Lab 05/23/15 1348  TROPONINI <0.03    Glucose  Recent Labs Lab 05/23/15 1351 05/24/15 0211 05/24/15 0830  GLUCAP 125* 129* 125*    Imaging No results found.  STUDIES:  Chest xray- 4/6- Bilat pulmonary infiltrates- edema, CHF. Bilat PNA cannot be excluded. Abd [place]- 4/5- No evidence of fluid collection in right and left lower quadrant. Bilat Lower extrem US-Small superficial fluid collection along the medial left thigh which could reflect that abscess. It measures 2.9 cm greatest dimension but only 6 mm in thickness. 2. No evidence of a right medial thigh abscess/fluid collection.3. Bilateral medial thigh nonspecific edema.   CULTURES: 4/4- Blood culture [facility] from [facility]- Acinetobacter. 4/5 bc x  2>> Gram negative diplococci>> 4/5 uc>> No growth - Final 4/5 Wound Cultures >> Abundant Gram neg rods >> Acinetobacter  ANTIBIOTICS: 4/5 zoysn>>4/6 4/5 vanc>> 4/7 4/6 Meropenem >>  SIGNIFICANT EVENTS: 4/5 transferred to [facility] 4/5 LCB , no intubation, no cpr, no shock 4/6 Results of B/c- acinetobacter from [place]  4/6 Meropenem started  LINES/TUBES: 4/4 left I J cvl>>  DISCUSSION: Super morbidly obese  female(850 lbs) who is a limited code. Been managed for Acinetobacter bacteremia, in  septic shock requiring pressors. ABG with Non anion gap, metabolic acidosis- No Azotemia, has a lactic acidosis and concomittant respiratory acidosis - likely from OHS and OSA.   ASSESSMENT / PLAN:  PULMONARY A: Acute hypoxemic hypercapnic resp fx 2/2 osa/sepsis/pulm edema P:   O2 as needed on nasal canula Consider Bipap for support prn  CARDIOVASCULAR A:  Hypotension from presumed shock Atrial Fib on ?xalreto at home P:  Pressors as needed Rate control as needed Hold anticoag with low platelets  RENAL A:   Worsening AKI on CKD; oliguric Metabolic Acidosis Hyperkalemia ATN Likely  P:   Monitor creatine, K Pressors Limit fluids Bicarb drip > 50 mls/hr   GASTROINTESTINAL A:   Morbidly obese P:   NPO PPI  HEMATOLOGIC A:   On eliquis for chronic afib Thrombocytopenia P:  No anticoagulation 2/2 low platelet   INFECTIOUS A:   Acinetobacter bacteremia and cellulitis Cellulitis lower panus infections, worsening,  P:   Cont Vanc/meropenem No indication for surgery  ENDOCRINE A:   Monitor glucose  P:   SSI  NEUROLOGIC A:   AMS/confusion from sepsis and marked acidosis P:   RASS goal: 0 Careful with any sedation in 850 lb person On MIND-[place] Study Research Fentanyl prn.   Critical care time spent on this pt : 35 minutes.   FAMILY  - Updates: Palliative care seeing pt.   Pollie MeyerJ. Angelo A de Dios, MD 05/26/2015, 9:24 AM [place] [facility] Pager [phone] After 3 pm or if no answer, call [phone]

## 2015-05-26 NOTE — Progress Notes (Signed)
Daily Progress Note   Patient Name: Michele Weber       Date: 05/26/2015 DOB: [birth year]  Age: 45 y.o. MRN#: [id number] Attending Physician: Louann Sjogren, MD Primary Care Physician: Thad Ranger, MD Admit Date: 05/23/2015  Reason for Consultation/Follow-up: Establishing goals of care, Non pain symptom management, Pain control and Psychosocial/spiritual support  Subjective: Patient more alert today. She is not observed to be saying "oh God oh God". She has asked specifically for "pain pill". She states her legs hurt. When you call her name she will look into, and say hello. When her family walks into the room, she sees her daughter and is able to call her by name and tells her that she has her her son is also in the room and she is able to do the same for him. Her speech is sometimes repetitive and she is not conversant, only answering simple questions occasionally. Despite improvement in her level of consciousness her laboratory values reflect worsening clinical status. Her white blood cell count has doubled to 28.9, platelets are 48, and her renal function is worsening as evidenced by increased creatinine of 4.18. She also has a low-grade temperature today at 99. I did convey this news to her family. They still are in a watchful waiting mode in terms of wanting other family members to visit throughout the weekend. Daughter became quite tearful when patient spoke to her and told her that she left her. Interval Events: More alert but increased leukocytosis, increased creatinine Length of Stay: 3 days  Current Medications: Scheduled Meds:  . antiseptic oral rinse  7 mL Mouth Rinse q12n4p  . chlorhexidine  15 mL Mouth Rinse BID  . hydrocortisone sod succinate (SOLU-CORTEF) inj  100 mg  Intravenous Q8H  . meropenem (MERREM) IV  1 g Intravenous Q12H  . research study medication  2 mL Intravenous Q12H  . pantoprazole (PROTONIX) IV  40 mg Intravenous Q24H  . silver sulfADIAZINE   Topical Daily    Continuous Infusions: . norepinephrine (LEVOPHED) Adult infusion 17 mcg/min (05/26/15 0700)  .  sodium bicarbonate  infusion 1000 mL 50 mL/hr at 05/26/15 1336  . vasopressin (PITRESSIN) infusion - *FOR SHOCK* 0.03 Units/min (05/26/15 0700)    PRN Meds: sodium chloride, fentaNYL (SUBLIMAZE) injection  Physical Exam: Physical Exam  Constitutional:  Morbidly  obese  Cardiovascular:  Tachy, irrg  Pulmonary/Chest:  Increased work of breathing  Neurological: She is alert.  Abnormal movements to upper extremities still present. Do not seem to be worsening                Vital Signs: BP 94/65 mmHg  Pulse 132  Temp(Src) 98.3 F (36.8 C) (Oral)  Resp 19  Ht  (1.676 m)  Wt 325.683 kg (718 lb)  BMI 115.94 kg/m2  SpO2 98%  LMP  (LMP Unknown) SpO2: SpO2: 98 % O2 Device: O2 Device: Nasal Cannula O2 Flow Rate: O2 Flow Rate (L/min): 2 L/min  Intake/output summary:  Intake/Output Summary (Last 24 hours) at 05/26/15 1434 Last data filed at 05/26/15 1200  Gross per 24 hour  Intake   1867 ml  Output    480 ml  Net   1387 ml   LBM: Last BM Date: 05/25/15 Baseline Weight: Weight: (!) 363 kg (800 lb 4.3 oz) Most recent weight: Weight: (!) 325.683 kg (718 lb)       Palliative Assessment/Data: Flowsheet Rows        Most Recent Value   Intake Tab    Referral Department  Hospitalist   Unit at Time of Referral  ICU   Palliative Care Primary Diagnosis  Sepsis/Infectious Disease   Date Notified  05/24/15   Palliative Care Type  New Palliative care   Reason for referral  Clarify Goals of Care   Date of Admission  05/19/15   Date first seen by Palliative Care  05/25/15   # of days Palliative referral response time  1 Day(s)   # of days IP prior to Palliative referral  5     Clinical Assessment    Palliative Performance Scale Score  20%   Pain Max last 24 hours  Not able to report   Pain Min Last 24 hours  Not able to report   Dyspnea Max Last 24 Hours  Not able to report   Dyspnea Min Last 24 hours  Not able to report   Nausea Max Last 24 Hours  Not able to report   Nausea Min Last 24 Hours  Not able to report   Anxiety Max Last 24 Hours  Not able to report   Anxiety Min Last 24 Hours  Not able to report   Other Max Last 24 Hours  Not able to report   Psychosocial & Spiritual Assessment    Palliative Care Outcomes    Patient/Family meeting held?  Yes   Who was at the meeting?  daughter, pt's sister   Palliative Care follow-up planned  Yes, Facility      Additional Data Reviewed: CBC    Component Value Date/Time   WBC 28.9* 05/26/2015 0415   WBC 7.2 05/13/2013   RBC 3.47* 05/26/2015 0415   HGB 10.4* 05/26/2015 0415   HCT 30.8* 05/26/2015 0415   PLT 48* 05/26/2015 0415   MCV 88.8 05/26/2015 0415   MCH 30.0 05/26/2015 0415   MCHC 33.8 05/26/2015 0415   RDW 16.0* 05/26/2015 0415   LYMPHSABS 0.5* 05/23/2015 1348   MONOABS 0.7 05/23/2015 1348   EOSABS 0.0 05/23/2015 1348   BASOSABS 0.0 05/23/2015 1348    CMP     Component Value Date/Time   NA 137 05/26/2015 0415   NA 136* 05/13/2013   K 5.0 05/26/2015 0415   CL 101 05/26/2015 0415   CO2 21* 05/26/2015 0415   GLUCOSE 139* 05/26/2015 0415  BUN 64* 05/26/2015 0415   BUN 11 05/13/2013   CREATININE 4.18* 05/26/2015 0415   CREATININE 1.0 05/13/2013   CALCIUM 6.9* 05/26/2015 0415   PROT 5.8* 05/23/2015 1348   ALBUMIN 2.0* 05/23/2015 1348   AST 87* 05/23/2015 1348   ALT 47 05/23/2015 1348   ALKPHOS 34* 05/23/2015 1348   BILITOT 1.3* 05/23/2015 1348   GFRNONAA 12* 05/26/2015 0415   GFRAA 14* 05/26/2015 0415       Problem List:  Patient Active Problem List   Diagnosis Date Noted  . Acute respiratory failure (HCC)   . Cellulitis of left lower extremity   . Abscess   .  Respiratory failure (HCC)   . Septic shock (HCC)   . Sepsis (HCC) 05/23/2015  . Unspecified hypothyroidism 10/04/2013  . Sepsis affecting skin 09/30/2013  . Coag negative Staphylococcus bacteremia 09/30/2013  . Hypotension 09/30/2013  . Edema 09/14/2013  . Atrial fibrillation with RVR (HCC) 08/11/2013  . Cellulitis and abscess of trunk 07/21/2013  . Abnormal weight gain 07/08/2013  . Anemia, unspecified 07/08/2013  . Encounter for long-term (current) use of other medications 07/04/2013  . Amenorrhea 06/07/2013  . Open wound of knee, leg (except thigh), and ankle, complicated 06/03/2013  . Cellulitis of leg, left 05/12/2013  . Lymphedema 05/12/2013  . Hypokalemia 05/12/2013  . Unspecified protein-calorie malnutrition (HCC) 05/12/2013  . Physical deconditioning 05/12/2013  . Morbid obesity (HCC)   . Lymphedema of both lower extremities   . Diastolic CHF (HCC)   . Vitamin D deficiency      Palliative Care Assessment & Plan    1.Code Status:  Limited code    Code Status Orders        Start     Ordered   05/23/15 1241  Limited resuscitation (code)   Continuous    Question Answer Comment  In the event of cardiac or respiratory ARREST: Initiate Code Blue, Call Rapid Response Yes   In the event of cardiac or respiratory ARREST: Perform CPR No   In the event of cardiac or respiratory ARREST: Perform Intubation/Mechanical Ventilation No   In the event of cardiac or respiratory ARREST: Use NIPPV/BiPAp only if indicated Yes   In the event of cardiac or respiratory ARREST: Administer ACLS medications if indicated Yes   In the event of cardiac or respiratory ARREST: Perform Defibrillation or Cardioversion if indicated No      05/23/15 1241    Code Status History    Date Active Date Inactive Code Status Order ID Comments User Context   09/30/2013  6:03 PM 10/04/2013 10:01 PM Full Code [id number]  Cathren Harshipudeep K Rai, MD Inpatient   08/11/2013  3:10 AM 08/15/2013  5:09 PM Full Code  [id number]  Lynden OxfordPranav Patel, MD Inpatient       2. Goals of Care/Additional Recommendations:  Continue to support the family and this watchful waiting mode. I did update them that despite her improved level of alertness clinically she continues to worsen  She remains on pressors.  Staffed with Dr. Christene Slatese Dios, feel that family will need through the weekend and further follow-up the first of the week before complete comfort care decision can be reached  Limitations on Scope of Treatment: No Hemodialysis, No Surgical Procedures and No Tracheostomy  Desire for further Chaplaincy support:no  Psycho-social Needs: Grief/Bereavement Support  3. Symptom Management:      1. Pain: Patient is verbalizing pain to her legs. Fentanyl 25 g has been helpful but very short acting. We'll provide  range of fentanyl 25-50 g every 2 hours as needed  4. Palliative Prophylaxis:   Aspiration, Delirium Protocol and Frequent Pain Assessment  5. Prognosis: < 2 weeks  6. Discharge Planning:  Patient has a very poor prognosis. I would not be surprised if she passed in the hospital. We'll follow-up the patient and family on 05/27/2015   Care plan was discussed with Dr. Christene Slates  Thank you for allowing the Palliative Medicine Team to assist in the care of this patient.   Time In: 1000 Time Out: 1040 Total Time 40 min Prolonged Time Billed  no         Irean Hong, NP  05/26/2015, 2:34 PM  Please contact Palliative Medicine Team phone at [phone] for questions and concerns.

## 2015-05-27 LAB — GLUCOSE, CAPILLARY
GLUCOSE-CAPILLARY: 149 mg/dL — AB (ref 65–99)
GLUCOSE-CAPILLARY: 160 mg/dL — AB (ref 65–99)
Glucose-Capillary: 167 mg/dL — ABNORMAL HIGH (ref 65–99)
Glucose-Capillary: 158 mg/dL — ABNORMAL HIGH (ref 65–99)
Glucose-Capillary: 155 mg/dL — ABNORMAL HIGH (ref 65–99)
Glucose-Capillary: 145 mg/dL — ABNORMAL HIGH (ref 65–99)

## 2015-05-27 MED ORDER — LEVOTHYROXINE SODIUM 100 MCG IV SOLR
25.0000 ug | Freq: Every day | INTRAVENOUS | Status: DC
  Administered 2015-05-27 – 2015-06-01 (×6): 25 ug via INTRAVENOUS
  Filled 2015-05-27 (×6): qty 5

## 2015-05-27 MED ORDER — MIDAZOLAM HCL 2 MG/2ML IJ SOLN
INTRAMUSCULAR | Status: AC
  Administered 2015-05-27: 0.5 mg via INTRAVENOUS
  Filled 2015-05-27: qty 2

## 2015-05-27 MED ORDER — BUMETANIDE 0.25 MG/ML IJ SOLN
0.5000 mg | Freq: Every day | INTRAMUSCULAR | Status: DC
  Administered 2015-05-27 – 2015-05-28 (×2): 0.5 mg via INTRAVENOUS
  Filled 2015-05-27 (×2): qty 2

## 2015-05-27 MED ORDER — MIDAZOLAM HCL 2 MG/2ML IJ SOLN
0.5000 mg | Freq: Once | INTRAMUSCULAR | Status: AC
  Administered 2015-05-27: 0.5 mg via INTRAVENOUS

## 2015-05-27 MED ORDER — INSULIN ASPART 100 UNIT/ML ~~LOC~~ SOLN
0.0000 [IU] | SUBCUTANEOUS | Status: DC
  Administered 2015-05-27 (×4): 2 [IU] via SUBCUTANEOUS
  Administered 2015-05-27 – 2015-06-03 (×17): 1 [IU] via SUBCUTANEOUS

## 2015-05-27 NOTE — Progress Notes (Signed)
Daily Progress Note   Patient Name: Michele Weber       Date: 05/27/2015 DOB: December 20, 1970  Age: 45 y.o. MRN#: [id number] Attending Physician: Louann SjogrenJose Angelo A de Dios, MD Primary Care Physician: Thad RangerAI,RIPUDEEP, MD Admit Date: 05/23/2015  Reason for Consultation/Follow-up: Establishing goals of care, Non pain symptom management and Pain control  Subjective: Family not at bedside this morning. Pain is better managed with fentanyl 25-50 g range. She is still complaining of leg pain intermittently. She has not made any progress in terms of weaning from pressors. She is however more alert, more verbal, speaking in full sentences today, tracking you visually. She tells me she feels anxious and worried. She is having ongoing abnormal involuntary movements of her upper extremities that she states does bother her and are uncomfortable. She was given Versed 0.5 mg 1 dose to glean effectiveness. She appeared to have decreased jerking. She verbalizes no improvement in terms of how it felt. Convey this information to CCM. Interval Events: Improved level of alertness Length of Stay: 4 days  Current Medications: Scheduled Meds:  . antiseptic oral rinse  7 mL Mouth Rinse q12n4p  . bumetanide  0.5 mg Intravenous Daily  . chlorhexidine  15 mL Mouth Rinse BID  . hydrocortisone sod succinate (SOLU-CORTEF) inj  100 mg Intravenous Q8H  . insulin aspart  0-9 Units Subcutaneous 6 times per day  . levothyroxine  25 mcg Intravenous Daily  . meropenem (MERREM) IV  1 g Intravenous Q12H  . research study medication  2 mL Intravenous Q12H  . pantoprazole (PROTONIX) IV  40 mg Intravenous Q24H  . silver sulfADIAZINE   Topical Daily    Continuous Infusions: . norepinephrine (LEVOPHED) Adult infusion 20 mcg/min (05/27/15  0920)  .  sodium bicarbonate  infusion 1000 mL 50 mL/hr at 05/27/15 1137  . vasopressin (PITRESSIN) infusion - *FOR SHOCK* 0.03 Units/min (05/27/15 0700)    PRN Meds: sodium chloride, fentaNYL (SUBLIMAZE) injection  Physical Exam: Physical Exam  Constitutional:  Morbidly obese  HENT:  Head: Normocephalic and atraumatic.  Cardiovascular:  Irrg, tachy  Pulmonary/Chest: Effort normal.  Neurological: She is alert.  More verbal; complete sentences today. Tracking better Continues to have abnormal UE movements  Skin: Skin is warm and dry.  Severe LE wounds  Psychiatric:  anxious  Nursing note and vitals  reviewed.               Vital Signs: BP 116/71 mmHg  Pulse 125  Temp(Src) 98.5 F (36.9 C) (Oral)  Resp 19  Ht 5\' 6"  (1.676 m)  Wt 326.59 kg (720 lb)  BMI 116.27 kg/m2  SpO2 97%  LMP  (LMP Unknown) SpO2: SpO2: 97 % O2 Device: O2 Device: Nasal Cannula O2 Flow Rate: O2 Flow Rate (L/min): 2 L/min  Intake/output summary:  Intake/Output Summary (Last 24 hours) at 05/27/15 1359 Last data filed at 05/27/15 1100  Gross per 24 hour  Intake 2136.67 ml  Output    395 ml  Net 1741.67 ml   LBM: Last BM Date: 05/26/15 Baseline Weight: Weight: (!) 363 kg (800 lb 4.3 oz) Most recent weight: Weight: (!) 326.59 kg (720 lb)       Palliative Assessment/Data: Flowsheet Rows        Most Recent Value   Intake Tab    Referral Department  Hospitalist   Unit at Time of Referral  ICU   Palliative Care Primary Diagnosis  Sepsis/Infectious Disease   Date Notified  05/24/15   Palliative Care Type  New Palliative care   Reason for referral  Clarify Goals of Care   Date of Admission  05/19/15   Date first seen by Palliative Care  05/25/15   # of days Palliative referral response time  1 Day(s)   # of days IP prior to Palliative referral  5   Clinical Assessment    Palliative Performance Scale Score  20%   Pain Max last 24 hours  Not able to report   Pain Min Last 24 hours  Not able to  report   Dyspnea Max Last 24 Hours  Not able to report   Dyspnea Min Last 24 hours  Not able to report   Nausea Max Last 24 Hours  Not able to report   Nausea Min Last 24 Hours  Not able to report   Anxiety Max Last 24 Hours  Not able to report   Anxiety Min Last 24 Hours  Not able to report   Other Max Last 24 Hours  Not able to report   Psychosocial & Spiritual Assessment    Palliative Care Outcomes    Patient/Family meeting held?  Yes   Who was at the meeting?  daughter, pt's sister   Palliative Care follow-up planned  Yes, Facility      Additional Data Reviewed: CBC    Component Value Date/Time   WBC 28.9* 05/26/2015 0415   WBC 7.2 05/13/2013   RBC 3.47* 05/26/2015 0415   HGB 10.4* 05/26/2015 0415   HCT 30.8* 05/26/2015 0415   PLT 48* 05/26/2015 0415   MCV 88.8 05/26/2015 0415   MCH 30.0 05/26/2015 0415   MCHC 33.8 05/26/2015 0415   RDW 16.0* 05/26/2015 0415   LYMPHSABS 0.5* 05/23/2015 1348   MONOABS 0.7 05/23/2015 1348   EOSABS 0.0 05/23/2015 1348   BASOSABS 0.0 05/23/2015 1348    CMP     Component Value Date/Time   NA 137 05/26/2015 0415   NA 136* 05/13/2013   K 5.0 05/26/2015 0415   CL 101 05/26/2015 0415   CO2 21* 05/26/2015 0415   GLUCOSE 139* 05/26/2015 0415   BUN 64* 05/26/2015 0415   BUN 11 05/13/2013   CREATININE 4.18* 05/26/2015 0415   CREATININE 1.0 05/13/2013   CALCIUM 6.9* 05/26/2015 0415   PROT 5.8* 05/23/2015 1348   ALBUMIN 2.0* 05/23/2015  1348   AST 87* 05/23/2015 1348   ALT 47 05/23/2015 1348   ALKPHOS 34* 05/23/2015 1348   BILITOT 1.3* 05/23/2015 1348   GFRNONAA 12* 05/26/2015 0415   GFRAA 14* 05/26/2015 0415       Problem List:  Patient Active Problem List   Diagnosis Date Noted  . Palliative care encounter   . Acute respiratory failure (HCC)   . Cellulitis of left lower extremity   . Abscess   . Respiratory failure (HCC)   . Septic shock (HCC)   . Sepsis (HCC) 05/23/2015  . Unspecified hypothyroidism 10/04/2013  .  Sepsis affecting skin 09/30/2013  . Coag negative Staphylococcus bacteremia 09/30/2013  . Hypotension 09/30/2013  . Edema 09/14/2013  . Atrial fibrillation with RVR (HCC) 08/11/2013  . Cellulitis and abscess of trunk 07/21/2013  . Abnormal weight gain 07/08/2013  . Anemia, unspecified 07/08/2013  . Encounter for long-term (current) use of other medications 07/04/2013  . Amenorrhea 06/07/2013  . Open wound of knee, leg (except thigh), and ankle, complicated 06/03/2013  . Cellulitis of leg, left 05/12/2013  . Lymphedema 05/12/2013  . Hypokalemia 05/12/2013  . Unspecified protein-calorie malnutrition (HCC) 05/12/2013  . Physical deconditioning 05/12/2013  . Morbid obesity (HCC)   . Lymphedema of both lower extremities   . Diastolic CHF (HCC)   . Vitamin D deficiency      Palliative Care Assessment & Plan    1.Code Status:  Limited code    Code Status Orders        Start     Ordered   05/23/15 1241  Limited resuscitation (code)   Continuous    Question Answer Comment  In the event of cardiac or respiratory ARREST: Initiate Code Blue, Call Rapid Response Yes   In the event of cardiac or respiratory ARREST: Perform CPR No   In the event of cardiac or respiratory ARREST: Perform Intubation/Mechanical Ventilation No   In the event of cardiac or respiratory ARREST: Use NIPPV/BiPAp only if indicated Yes   In the event of cardiac or respiratory ARREST: Administer ACLS medications if indicated Yes   In the event of cardiac or respiratory ARREST: Perform Defibrillation or Cardioversion if indicated No      05/23/15 1241    Code Status History    Date Active Date Inactive Code Status Order ID Comments User Context   09/30/2013  6:03 PM 10/04/2013 10:01 PM Full Code [id number]  Cathren Harsh, MD Inpatient   08/11/2013  3:10 AM 08/15/2013  5:09 PM Full Code [id number]  Lynden Oxford, MD Inpatient       2. Goals of Care/Additional Recommendations:  Can see family struggling greatly  with how to proceed with continuation versus discontinuation of pressors now that she is becoming more alert. Family would benefit from a multidisciplinary meeting with palliative medicine as well as CCM to address an endpoint in terms of continuation of pressors  Desire for further Chaplaincy support:no  Psycho-social Needs: Grief/Bereavement Support  3. Symptom Management:      1. Pain: Continue fentanyl 25-50 g every 2 hours as needed       2. Involuntary upper extremity movements: Patient didn't appear to have some improvement in terms of the amount in severity of involuntary upper extremity jerking after 0.5 mg of Versed. This is a difficult situation given that medications in the class of benzos would likely help this but will could adversely affect her blood pressure. Continue to monitor and adjust his family which is further  decisions regarding treatment plan  4. Palliative Prophylaxis:   Aspiration, Delirium Protocol, Frequent Pain Assessment and Oral Care  5. Prognosis: < 4 weeks  6. Discharge Planning:  Despite her level of alertness increasing I think we should still consider the possibility that she will not survive this hospitalization because she cannot maintain her blood pressure without use of pressors.   Care plan was discussed with Dr. Christene Slates  Thank you for allowing the Palliative Medicine Team to assist in the care of this patient.   Time In: 0800 Time Out: 0830 Total Time 30 min Prolonged Time Billed  no         Irean Hong, NP  05/27/2015, 1:59 PM  Please contact Palliative Medicine Team phone at [phone] for questions and concerns.

## 2015-05-27 NOTE — Progress Notes (Deleted)
eLink Physician-Brief Progress Note Patient Name: Michele MowersSharon Weber DOB: 08/11/1970 MRN: [id number]   Date of Service  05/27/2015  HPI/Events of Note  Hyperglycemia with blood sugars of 158 and 201  eICU Interventions  Placed on q4 hour SSI - sensitive scale     Intervention Category Intermediate Interventions: Hyperglycemia - evaluation and treatment  Anetria Harwick 05/27/2015, 1:10 AM

## 2015-05-27 NOTE — Progress Notes (Signed)
PULMONARY / CRITICAL CARE MEDICINE   Name: Michele Weber MRN: [id number] DOB: 1970/06/20    ADMISSION DATE:  05/23/2015  REFERRING MD:  Duke Salvia  CHIEF COMPLAINT:  Hypotension  BRIEF Hx of Present Illness-   Morbidly obese 850 lbs presented to [facility] 4/4 withAMS, hypotension , lactic acidosis, elevated pro calcitonin, BC with GNR, draining pannus wounds. Treated empirically with abx and promptly transferred to [facility] 4/5 on levophed at 20 and dobutamine at 55 cc/hr which we will dc until SVO2 is checked. She has a history of A fib on Xarleto but being held as of now. Family consulted and changed her code status to LCB with no intubation, cpr or shock. Abx of Vancomycin and zosyn instituted pan cultured on admission, then switched to meropenem and Vanc D/c, as blood culture results grew Acinetobacter. Her prognosis is poor.   SUBJECTIVE: Patient is awake, tells me her name, knows her childrens names, knows where she lives, but does not know where she is. Intermittent jerking.  VITAL SIGNS: BP 122/80 mmHg  Pulse 113  Temp(Src) 98.5 F (36.9 C) (Oral)  Resp 24  Ht  (1.676 m)  Wt 720 lb (326.59 kg)  BMI 116.27 kg/m2  SpO2 99%  LMP  (LMP Unknown)  HEMODYNAMICS:     VENTILATOR SETTINGS:  Not on Vent.  INTAKE / OUTPUT: I/O last 3 completed shifts: In: 3431.7 [I.V.:3031.7; IV Piggyback:400] Out: 550 [Urine:550]  PHYSICAL EXAMINATION: General: Morbidly obese, following commands Neuro:  Awake. Mental status has improved some. HEENT: no neck, oral mucosa appears slightly dry Cardiovascular: Distant heart sounds Lungs: Decreased air movement.  Abdomen:  Morbidly obese, Huge abd pannus, not tender Musculoskeletal:  Huge pannus on extremities, markedly indurated Skin:left and right pannus with draining wounds, skin on pannus on bilat lower extremity-thighs, sloughed off, hyperemic wounds , no purulence, large hyperpimented pannus bilat, marked discoloured areas  not improved, large billae on left feet.  LABS:  BMET  Recent Labs Lab 05/24/15 0345 05/25/15 0521 05/26/15 0415  NA 134* 133* 137  K 5.2* 5.0 5.0  CL 103 99* 101  CO2 16* 19* 21*  BUN 52* 60* 64*  CREATININE 3.45* 3.84* 4.18*  GLUCOSE 151* 123* 139*    Electrolytes  Recent Labs Lab 05/23/15 1348 05/24/15 0345 05/25/15 0521 05/26/15 0415  CALCIUM 7.0* 7.0* 6.9* 6.9*  MG 1.6* 1.6*  --  2.0  PHOS 5.0* 4.7*  --  4.5    CBC  Recent Labs Lab 05/24/15 0345 05/25/15 0521 05/26/15 0415  WBC 10.4 14.7* 28.9*  HGB 11.9* 11.1* 10.4*  HCT 37.0 32.9* 30.8*  PLT 58* 35* 48*    Sepsis Markers  Recent Labs Lab 05/23/15 1348  LATICACIDVEN 4.5*    ABG  Recent Labs Lab 05/23/15 1415 05/24/15 1139  PHART 7.176* 7.220*  PCO2ART 36.2 41.2  PO2ART 149* 144.0*    Liver Enzymes  Recent Labs Lab 05/23/15 1348  AST 87*  ALT 47  ALKPHOS 34*  BILITOT 1.3*  ALBUMIN 2.0*    Cardiac Enzymes  Recent Labs Lab 05/23/15 1348  TROPONINI <0.03    Glucose  Recent Labs Lab 05/23/15 1351 05/24/15 0211 05/24/15 0830 05/27/15 0031 05/27/15 0436  GLUCAP 125* 129* 125* 149* 160*    Imaging No results found.  STUDIES:  Chest xray- 4/6- Bilat pulmonary infiltrates- edema, CHF. Bilat PNA cannot be excluded. Abd [place]- 4/5- No evidence of fluid collection in right and left lower quadrant. Bilat Lower extrem US-Small superficial fluid collection  along the medial left thigh which could reflect that abscess. It measures 2.9 cm greatest dimension but only 6 mm in thickness. 2. No evidence of a right medial thigh abscess/fluid collection.3. Bilateral medial thigh nonspecific edema.   CULTURES: 4/4- [facility]- Acinetobacter. 4/5 bc x 2>> Gram negative diplococci>> 4/5 uc>> No growth - Final 4/5 Wound Cultures >> Final ACINETOBACTER CALCOACETICUS/BAUMANNII COMPLEX  ANTIBIOTICS: 4/5 zoysn>>4/6 4/5 vanc>> 4/7 4/6 Meropenem >>    SIGNIFICANT EVENTS: 4/5 transferred to [facility] 4/5 LCB , no intubation, no cpr, no shock 4/6 Results of B/c- acinetobacter from [facility]  4/6 Meropenem started.  LINES/TUBES: 4/4 left I J cvl>>  DISCUSSION: Super morbidly obese  female(850 lbs) who is a limited code. Been managed for Acinetobacter bacteremia, in septic shock requiring pressors. ABG with Non anion gap, metabolic acidosis- No Azotemia, has a lactic acidosis and concomittant respiratory acidosis - likely from OHS and OSA. Pt now on Meropenem. Started on PRN fentanyl. Also Oliguric, trial of diuretics.  ASSESSMENT / PLAN:  PULMONARY A: Acute hypoxemic hypercapnic resp fx 2/2 osa/sepsis/pulm edema P:   O2 as needed on nasal canula Consider Bipap for support prn  CARDIOVASCULAR A:  Hypotension from presumed shock Atrial Fib on ?xalreto at home P:  Pressors as needed Rate control as needed Hold anticoag with low platelets  RENAL A:   Worsening AKI on CKD, Cr- 4.18  4/9  oliguric Metabolic Acidosis Hyperkalemia ATN Likely P:   Monitor creatine, K Pressors Limit fluids Bicarb drip > 50 mls/hr Trial of bumex- 0.5mg  daily, as albumin is low-2.  GASTROINTESTINAL A:   Morbidly obese P:   NPO PPI  HEMATOLOGIC A:   On eliquis for chronic afib Thrombocytopenia P:  No anticoagulation 2/2 low platelet  INFECTIOUS A:   Acinetobacter bacteremia and cellulitis Cellulitis lower panus infections, worsening,  P:   On meropenem  Vanc and zosyn d/c No indication for surgery  ENDOCRINE A:   Monitor glucose  P:   SSI  NEUROLOGIC A:   AMS/confusion from sepsis and marked acidosis P:   RASS goal: 0 Careful with any sedation in 850 lb person, will give one time dose of Versed 0.5mg , considering persistent intermittent jerking. On M[employer] Study Research Fentanyl prn for pain  FAMILY   - Updates: Palliative care seeing pt. Consideration to withdraw care this weekend or tomorrow.  Inocente SallesEjiro Ricky Doan,  MD. PGY-3 IMTS 9:07 AM 05/27/2015

## 2015-05-28 ENCOUNTER — Inpatient Hospital Stay (HOSPITAL_COMMUNITY): Payer: Medicare Other

## 2015-05-28 DIAGNOSIS — Z7189 Other specified counseling: Secondary | ICD-10-CM | POA: Insufficient documentation

## 2015-05-28 DIAGNOSIS — N170 Acute kidney failure with tubular necrosis: Secondary | ICD-10-CM

## 2015-05-28 LAB — CBC
HEMATOCRIT: 29.6 % — AB (ref 36.0–46.0)
Hemoglobin: 9.7 g/dL — ABNORMAL LOW (ref 12.0–15.0)
MCH: 29.9 pg (ref 26.0–34.0)
MCHC: 32.8 g/dL (ref 30.0–36.0)
MCV: 91.4 fL (ref 78.0–100.0)
PLATELETS: 36 10*3/uL — AB (ref 150–400)
RBC: 3.24 MIL/uL — AB (ref 3.87–5.11)
RDW: 16.1 % — ABNORMAL HIGH (ref 11.5–15.5)
WBC: 39.1 10*3/uL — AB (ref 4.0–10.5)

## 2015-05-28 LAB — GLUCOSE, CAPILLARY
GLUCOSE-CAPILLARY: 126 mg/dL — AB (ref 65–99)
GLUCOSE-CAPILLARY: 130 mg/dL — AB (ref 65–99)
GLUCOSE-CAPILLARY: 140 mg/dL — AB (ref 65–99)
Glucose-Capillary: 126 mg/dL — ABNORMAL HIGH (ref 65–99)
Glucose-Capillary: 141 mg/dL — ABNORMAL HIGH (ref 65–99)
Glucose-Capillary: 148 mg/dL — ABNORMAL HIGH (ref 65–99)

## 2015-05-28 LAB — BASIC METABOLIC PANEL
Anion gap: 14 (ref 5–15)
BUN: 76 mg/dL — ABNORMAL HIGH (ref 6–20)
CHLORIDE: 100 mmol/L — AB (ref 101–111)
CO2: 24 mmol/L (ref 22–32)
CREATININE: 5.02 mg/dL — AB (ref 0.44–1.00)
Calcium: 7.2 mg/dL — ABNORMAL LOW (ref 8.9–10.3)
GFR calc non Af Amer: 10 mL/min — ABNORMAL LOW (ref >60)
GFR, EST AFRICAN AMERICAN: 11 mL/min — AB (ref >60)
Glucose, Bld: 162 mg/dL — ABNORMAL HIGH (ref 65–99)
POTASSIUM: 4.8 mmol/L (ref 3.5–5.1)
SODIUM: 138 mmol/L (ref 135–145)

## 2015-05-28 LAB — MAGNESIUM: Magnesium: 2 mg/dL (ref 1.7–2.4)

## 2015-05-28 MED ORDER — VANCOMYCIN HCL 10 G IV SOLR
1750.0000 mg | INTRAVENOUS | Status: DC
  Administered 2015-05-28: 1750 mg via INTRAVENOUS
  Filled 2015-05-28: qty 1750

## 2015-05-28 MED ORDER — NOREPINEPHRINE BITARTRATE 1 MG/ML IV SOLN
2.0000 ug/min | INTRAVENOUS | Status: DC
  Filled 2015-05-28: qty 4

## 2015-05-28 MED ORDER — STUDY - INVESTIGATIONAL DRUG SIMPLE RECORD (ML)
1.0000 mL | Freq: Two times a day (BID) | Status: DC
  Administered 2015-05-28: 1 mL via INTRAVENOUS
  Filled 2015-05-28: qty 1

## 2015-05-28 MED ORDER — VITAL HIGH PROTEIN PO LIQD
1000.0000 mL | ORAL | Status: DC
  Administered 2015-05-28: 1000 mL
  Administered 2015-05-29 (×3)
  Administered 2015-05-29: 1000 mL
  Administered 2015-05-29: 19:00:00
  Administered 2015-05-29: 1000 mL
  Administered 2015-05-29 (×2)
  Filled 2015-05-28 (×2): qty 1000

## 2015-05-28 MED ORDER — VANCOMYCIN HCL 10 G IV SOLR: 2000.0000 mg | INTRAVENOUS | Status: DC

## 2015-05-28 MED ORDER — PRO-STAT SUGAR FREE PO LIQD
30.0000 mL | Freq: Three times a day (TID) | ORAL | Status: DC
  Administered 2015-05-28 – 2015-06-04 (×20): 30 mL
  Filled 2015-05-28 (×22): qty 30

## 2015-05-28 MED ORDER — SODIUM CHLORIDE 0.9 % IV SOLN
2.0000 g | Freq: Two times a day (BID) | INTRAVENOUS | Status: DC
  Administered 2015-05-28 – 2015-05-31 (×6): 2 g via INTRAVENOUS
  Filled 2015-05-28 (×8): qty 2

## 2015-05-28 MED ORDER — STUDY - INVESTIGATIONAL DRUG SIMPLE RECORD (ML)
0.5000 mL | Freq: Two times a day (BID) | Status: DC
  Administered 2015-05-28: 0.5 mL via INTRAVENOUS
  Filled 2015-05-28: qty 0.5

## 2015-05-28 NOTE — Progress Notes (Addendum)
Pharmacy Antibiotic Note  Michele Weber is a 45 y.o. female admitted on 05/23/2015 with sepsis from chronic cellulitis. Repeat blood cx originally reported gram pos cocci but has since been corrected to gram neg coccobacilli. Pharmacy has been consulted for vancomycin/meropenem dosing.  Plan: -Change to meropenem 2g q12h -Change to vancomycin 2g q48h -f/u cultures, LOT, clinical course -Will follow VT at SS  Height: 5\' 6"  (167.6 cm) Weight: (!) 713 lb (323.415 kg) IBW/kg (Calculated) : 59.3  Temp (24hrs), Avg:98.2 F (36.8 C), Min:97.6 F (36.4 C), Max:98.6 F (37 C)   Recent Labs Lab 05/23/15 1348 05/24/15 0345 05/24/15 1430 05/25/15 0521 05/26/15 0415 05/28/15 0410  WBC 8.1 10.4  --  14.7* 28.9* 39.1*  CREATININE 3.42* 3.45*  --  3.84* 4.18* 5.02*  LATICACIDVEN 4.5*  --   --   --   --   --   VANCORANDOM  --   --  34 30  --   --     Estimated Creatinine Clearance: 36.8 mL/min (by C-G formula based on Cr of 5.02).    No Known Allergies  Antimicrobials this admission: Levaquin PTA Vanc 4/4 >> 4/7  Zosyn 4/4 >> 4/6  Merrem 4/6 >>  Vanc 4/10 >>  Dose adjustments this admission: 4/6 vanc random = 34 mcg/mL (post 2500mg  on 4/5 at 1500)  4/7 vanc random = 30 mcg/mL (no further doses)   Microbiology results: Blood Duke Salvia([place]) - Acinetobacter (sensitive Zosyn, Merrem)  Abscess cx () - negative  4/5 BCx x2 - Acinetobacter (sensitive Unasyn, Primaxin, Zosyn)  4/5 UCx - negative  4/5 left thigh wound - Acinetobacter (sensitive Unasyn, Fortaz, Primaxin) 4/9 BCx: gram neg coccobacilli  Thank you for allowing pharmacy to be a part of this patient's care.  Sandi CarneNick Angelena Sand, PharmD Pharmacy Resident Pager: [phone] 05/28/2015 8:37 AM

## 2015-05-28 NOTE — Progress Notes (Signed)
eLink Physician-Brief Progress Note Patient Name: Marcie MowersSharon Lintner DOB: [birth year] MRN: [id number]   Date of Service  05/28/2015  HPI/Events of Note  GPC in 2 of 2 blood cultures from 05/27/15.  eICU Interventions  Add vancomycin.     Intervention Category Major Interventions: Other:  Willo Yoon 05/28/2015, 2:57 AM

## 2015-05-28 NOTE — Progress Notes (Signed)
Pt has gram positive cocci in aerobic bottle. Dr. Craige CottaSood made aware.

## 2015-05-28 NOTE — Progress Notes (Signed)
Daily Progress Note   Patient Name: Markala Sitts       Date: 05/28/2015 DOB: [birth year]  Age: 45 y.o. MRN#: [id number] Attending Physician: Louann Sjogren, MD Primary Care Physician: Thad Ranger, MD Admit Date: 05/23/2015  Reason for Consultation/Follow-up: Establishing goals of care  45 yo female with morbid obesity (700+ lbs), lymphedema, chronic cellulitis, D-CHF, Afib.  She resides at [facility].  She was transferred to [facility] with septic shock from pannus wounds.  She has acute renal failure and her creatinine on 4/10 has climbed to 5.02.  She remains on levophed and in the ICU.  She has had hypoactive delirium which seems to be resolving.  She is currently on a study drug for delirium (Haldol /geodon /or placebo).  2 of 2 Blood cultures from 4/9 show GNR.   Subjective: Patient gives short appropriate answers to questions.    She is from [facility] and states she entertains herself with Facebook and the internet.   She denies pain.  States she would like to eat something.  Interval Events: WBC and Creatinine have risen significantly despite being on antibiotics.  Meropenem dose increased.  Vasopressin stopped.  Levofed was discontinued for approximately 20 - 30 minutes but restarted as the patient's BP dropped to 60s/30s.     Length of Stay: 5 days  Current Medications: Scheduled Meds:  . antiseptic oral rinse  7 mL Mouth Rinse q12n4p  . chlorhexidine  15 mL Mouth Rinse BID  . insulin aspart  0-9 Units Subcutaneous 6 times per day  . levothyroxine  25 mcg Intravenous Daily  . meropenem (MERREM) IV  2 g Intravenous Q12H  . research study medication  1 mL Intravenous Q12H  . pantoprazole (PROTONIX) IV  40 mg Intravenous Q24H  . silver sulfADIAZINE   Topical Daily     Continuous Infusions: . norepinephrine (LEVOPHED) Adult infusion 2 mcg/min (05/28/15 1157)  . vasopressin (PITRESSIN) infusion - *FOR SHOCK* Stopped (05/28/15 0418)    PRN Meds: sodium chloride, fentaNYL (SUBLIMAZE) injection   Physical Exam  HENT:  N/G in place  Pulmonary/Chest: Effort normal. No respiratory distress.  Abdominal: Soft. She exhibits no distension.  Massive obesity.  Neurological: She is alert.  Slight lethargy.  Simple responses.  Will follow commands  Vital Signs: BP 112/91 mmHg  Pulse 147  Temp(Src) 97.2 F (36.2 C) (Oral)  Resp 17  Ht  (1.676 m)  Wt 323.415 kg (713 lb)  BMI 115.14 kg/m2  SpO2 99%  LMP 05/28/2015 SpO2: SpO2: 99 % O2 Device: O2 Device: Nasal Cannula O2 Flow Rate: O2 Flow Rate (L/min): 2 L/min  Intake/output summary:  Intake/Output Summary (Last 24 hours) at 05/28/15 1510 Last data filed at 05/28/15 1400  Gross per 24 hour  Intake 2360.51 ml  Output    810 ml  Net 1550.51 ml   LBM: Last BM Date: 05/27/15 Baseline Weight: Weight: (!) 363 kg (800 lb 4.3 oz) Most recent weight: Weight: (!) 323.415 kg (713 lb)       Palliative Assessment/Data: Flowsheet Rows        Most Recent Value   Intake Tab    Referral Department  Hospitalist   Unit at Time of Referral  ICU   Palliative Care Primary Diagnosis  Sepsis/Infectious Disease   Date Notified  05/24/15   Palliative Care Type  New Palliative care   Reason for referral  Clarify Goals of Care   Date of Admission  05/19/15   Date first seen by Palliative Care  05/25/15   # of days Palliative referral response time  1 Day(s)   # of days IP prior to Palliative referral  5   Clinical Assessment    Palliative Performance Scale Score  20%   Pain Max last 24 hours  Not able to report   Pain Min Last 24 hours  Not able to report   Dyspnea Max Last 24 Hours  Not able to report   Dyspnea Min Last 24 hours  Not able to report   Nausea Max Last 24 Hours  Not able  to report   Nausea Min Last 24 Hours  Not able to report   Anxiety Max Last 24 Hours  Not able to report   Anxiety Min Last 24 Hours  Not able to report   Other Max Last 24 Hours  Not able to report   Psychosocial & Spiritual Assessment    Palliative Care Outcomes    Patient/Family meeting held?  Yes   Who was at the meeting?  daughter, pt's sister   Palliative Care follow-up planned  Yes, Facility      Additional Data Reviewed: CBC    Component Value Date/Time   WBC 39.1* 05/28/2015 0410   WBC 7.2 05/13/2013   RBC 3.24* 05/28/2015 0410   HGB 9.7* 05/28/2015 0410   HCT 29.6* 05/28/2015 0410   PLT 36* 05/28/2015 0410   MCV 91.4 05/28/2015 0410   MCH 29.9 05/28/2015 0410   MCHC 32.8 05/28/2015 0410   RDW 16.1* 05/28/2015 0410   LYMPHSABS 0.5* 05/23/2015 1348   MONOABS 0.7 05/23/2015 1348   EOSABS 0.0 05/23/2015 1348   BASOSABS 0.0 05/23/2015 1348    CMP     Component Value Date/Time   NA 138 05/28/2015 0410   NA 136* 05/13/2013   K 4.8 05/28/2015 0410   CL 100* 05/28/2015 0410   CO2 24 05/28/2015 0410   GLUCOSE 162* 05/28/2015 0410   BUN 76* 05/28/2015 0410   BUN 11 05/13/2013   CREATININE 5.02* 05/28/2015 0410   CREATININE 1.0 05/13/2013   CALCIUM 7.2* 05/28/2015 0410   PROT 5.8* 05/23/2015 1348   ALBUMIN 2.0* 05/23/2015 1348   AST 87* 05/23/2015 1348   ALT 47 05/23/2015 1348   ALKPHOS 34*  05/23/2015 1348   BILITOT 1.3* 05/23/2015 1348   GFRNONAA 10* 05/28/2015 0410   GFRAA 11* 05/28/2015 0410       Problem List:  Patient Active Problem List   Diagnosis Date Noted  . Palliative care encounter   . Acute respiratory failure (HCC)   . Cellulitis of left lower extremity   . Abscess   . Respiratory failure (HCC)   . Septic shock (HCC)   . Sepsis (HCC) 05/23/2015  . Unspecified hypothyroidism 10/04/2013  . Sepsis affecting skin 09/30/2013  . Coag negative Staphylococcus bacteremia 09/30/2013  . Hypotension 09/30/2013  . Edema 09/14/2013  . Atrial  fibrillation with RVR (HCC) 08/11/2013  . Cellulitis and abscess of trunk 07/21/2013  . Abnormal weight gain 07/08/2013  . Anemia, unspecified 07/08/2013  . Encounter for long-term (current) use of other medications 07/04/2013  . Amenorrhea 06/07/2013  . Open wound of knee, leg (except thigh), and ankle, complicated 06/03/2013  . Cellulitis of leg, left 05/12/2013  . Lymphedema 05/12/2013  . Hypokalemia 05/12/2013  . Unspecified protein-calorie malnutrition (HCC) 05/12/2013  . Physical deconditioning 05/12/2013  . Morbid obesity (HCC)   . Lymphedema of both lower extremities   . Diastolic CHF (HCC)   . Vitamin D deficiency      Palliative Care Assessment & Plan    1.Code Status:  Limited code    Code Status Orders        Start     Ordered   05/23/15 1241  Limited resuscitation (code)   Continuous    Question Answer Comment  In the event of cardiac or respiratory ARREST: Initiate Code Blue, Call Rapid Response Yes   In the event of cardiac or respiratory ARREST: Perform CPR No   In the event of cardiac or respiratory ARREST: Perform Intubation/Mechanical Ventilation No   In the event of cardiac or respiratory ARREST: Use NIPPV/BiPAp only if indicated Yes   In the event of cardiac or respiratory ARREST: Administer ACLS medications if indicated Yes   In the event of cardiac or respiratory ARREST: Perform Defibrillation or Cardioversion if indicated No      05/23/15 1241    Code Status History    Date Active Date Inactive Code Status Order ID Comments User Context   09/30/2013  6:03 PM 10/04/2013 10:01 PM Full Code [id number]  Cathren Harshipudeep K Rai, MD Inpatient   08/11/2013  3:10 AM 08/15/2013  5:09 PM Full Code [id number]  Lynden OxfordPranav Patel, MD Inpatient       2. Goals of Care/Additional Recommendations:  Full scope treatment.   I have a call in to her daughter.  Will attempt to meet with family asap.  Limitations on Scope of Treatment: No Defib, CPR or intubation.  3. Symptom  Management:      1. Per primary team.    4. Palliative Prophylaxis:   Aspiration, Bowel Regimen, Delirium Protocol, Palliative Wound Care and Turn Reposition  5. Prognosis: < 2 weeks given over whelming infection and kidney failure.  6. Discharge Planning:  Hospice facility   Discuss patient's care with PCCM attending.  Thank you for allowing the Palliative Medicine Team to assist in the care of this patient.   Time In: 3:00 Time Out: 3:35 Total Time 35 min Prolonged Time Billed no        Stephani PoliceMarianne L York, PA-C  05/28/2015, 3:10 PM  Please contact Palliative Medicine Team phone at [phone] for questions and concerns.

## 2015-05-28 NOTE — Evaluation (Signed)
Clinical/Bedside Swallow Evaluation Patient Details  Name: Michele MowersSharon Weber MRN: [id number] Date of Birth: [birth year]  Today's Date: 05/28/2015 Time: SLP Start Time (ACUTE ONLY): 1430 SLP Stop Time (ACUTE ONLY): 1450 SLP Time Calculation (min) (ACUTE ONLY): 20 min  Past Medical History:  Past Medical History  Diagnosis Date  . Morbid obesity (HCC)   . Lymphedema of both lower extremities   . Diastolic CHF (HCC)   . Vitamin D deficiency    Past Surgical History: History reviewed. No pertinent past surgical history. HPI:  Morbidly obese 850 lbs [facility] withAMS, hypotension , lactic acidosis, elevated pro calcitonin, BC with GNR, draining pannus wounds.   Assessment / Plan / Recommendation Clinical Impression  Pt demonstrates immediate and delayed throat clearing with thin and puree textures. Throat clearing became more persistent with each trial, though never progressed to cough. Vocal quality slightly harsh after PO. Risk factors include short shallow breathing pattern and poor positioning in bed due to obesity, but otherwise swallow appears strong and timely, pt was not intubated and denies any history of dysphagia. Suspect PO trials resulted in increased pharyngeal sensation of NG with prolonged behavioral throat clearing vs actual dysphagia and sensation fo penetration. Pt is recommend to consume small sips of water and bites of ice tonight under RN supervision. SLP will return tomorrow to retest tolerance of PO for diet. Discussed with RN.     Aspiration Risk  Mild aspiration risk    Diet Recommendation Ice chips PRN after oral care   Medication Administration: Via alternative means    Other  Recommendations     Follow up Recommendations  24 hour supervision/assistance    Frequency and Duration min 2x/week  2 weeks       Prognosis Prognosis for Safe Diet Advancement: Good      Swallow Study   General HPI: Morbidly obese 850 lbs [facility] withAMS, hypotension , lactic acidosis, elevated pro calcitonin, BC with GNR, draining pannus wounds. Type of Study: Bedside Swallow Evaluation Previous Swallow Assessment: none Diet Prior to this Study: NPO;NG Tube Temperature Spikes Noted: Yes Respiratory Status: Nasal cannula History of Recent Intubation: No Behavior/Cognition: Alert;Cooperative Oral Cavity Assessment: Within Functional Limits Oral Care Completed by SLP: No Oral Cavity - Dentition: Adequate natural dentition Vision: Functional for self-feeding Self-Feeding Abilities: Total assist Patient Positioning: Postural control interferes with function;Partially reclined Baseline Vocal Quality: Normal Volitional Cough: Strong Volitional Swallow: Able to elicit    Oral/Motor/Sensory Function Overall Oral Motor/Sensory Function: Within functional limits   Ice Chips Ice chips: Within functional limits   Thin Liquid Thin Liquid: Impaired Presentation: Straw Pharyngeal  Phase Impairments: Throat Clearing - Immediate;Throat Clearing - Delayed    Nectar Thick Nectar Thick Liquid: Not tested   Honey Thick Honey Thick Liquid: Not tested   Puree Puree: Impaired Presentation: Spoon Pharyngeal Phase Impairments: Throat Clearing - Immediate;Throat Clearing - Delayed   Solid   GO   Solid: Not tested       Michele DittyBonnie Preethi Scantlebury, MA CCC-SLP [phone]  Michele Weber, Michele Weber Michele Weber 05/28/2015,3:03 PM

## 2015-05-28 NOTE — Progress Notes (Signed)
PULMONARY / CRITICAL CARE MEDICINE   Name: Michele Weber MRN: [id number] DOB: 02-27-1970    ADMISSION DATE:  05/23/2015  REFERRING MD:  Michele Weber  CHIEF COMPLAINT:  Hypotension  BRIEF Hx of Present Illness-   Morbidly obese 850 lbs presented to [facility] 4/4 withAMS, hypotension , lactic acidosis, elevated pro calcitonin, BC with GNR, draining pannus wounds. Treated empirically with abx and promptly transferred to [facility] 4/5 on levophed at 20 and dobutamine at 55 cc/hr which we will dc until SVO2 is checked. She has a history of A fib on Xarleto but being held as of now. Family consulted and changed her code status to LCB with no intubation, cpr or shock. Abx of Vancomycin and zosyn instituted pan cultured on admission, then switched to meropenem and Vanc D/c, as blood culture results grew Acinetobacter.  SUBJECTIVE: More alert than yesterday. Subjective improved. She is able to tell me where she is, her name, children's name and that she has pain in her left lower extremity.  VITAL SIGNS: BP 112/91 mmHg  Pulse 147  Temp(Src) 97.2 F (36.2 C) (Oral)  Resp 17  Ht  (1.676 m)  Wt 713 lb (323.415 kg)  BMI 115.14 kg/m2  SpO2 99%  LMP 05/28/2015  HEMODYNAMICS:     VENTILATOR SETTINGS:  Not on Vent.  INTAKE / OUTPUT: I/O last 3 completed shifts: In: 3756.8 [I.V.:2956.8; IV Piggyback:800] Out: 1040 [Urine:1040]  PHYSICAL EXAMINATION: General: Morbidly obese, following commands, opens eyes spontaneously Neuro:  Awake. Oriented to person and place. Per pts nurse, mental status fluctuates.  HEENT: no neck, oral mucosa appears slightly dry Cardiovascular: Distant heart sounds Lungs: Decreased air movement.  Abdomen:  Morbidly obese, Huge abd pannus, not tender Musculoskeletal:  Huge pannus on extremities, markedly indurated hyperpigmented cellulitis areas bilat Skin:left and right pannus with draining wounds, skin on pannus on bilat lower extremity-thighs, sloughed  off, hyperemic wounds , no purulence,llarge billae on left feet.  LABS:  BMET  Recent Labs Lab 05/25/15 0521 05/26/15 0415 05/28/15 0410  NA 133* 137 138  K 5.0 5.0 4.8  CL 99* 101 100*  CO2 19* 21* 24  BUN 60* 64* 76*  CREATININE 3.84* 4.18* 5.02*  GLUCOSE 123* 139* 162*    Electrolytes  Recent Labs Lab 05/23/15 1348 05/24/15 0345 05/25/15 0521 05/26/15 0415 05/28/15 0410  CALCIUM 7.0* 7.0* 6.9* 6.9* 7.2*  MG 1.6* 1.6*  --  2.0 2.0  PHOS 5.0* 4.7*  --  4.5  --     CBC  Recent Labs Lab 05/25/15 0521 05/26/15 0415 05/28/15 0410  WBC 14.7* 28.9* 39.1*  HGB 11.1* 10.4* 9.7*  HCT 32.9* 30.8* 29.6*  PLT 35* 48* 36*    Sepsis Markers  Recent Labs Lab 05/23/15 1348  LATICACIDVEN 4.5*    ABG  Recent Labs Lab 05/23/15 1415 05/24/15 1139  PHART 7.176* 7.220*  PCO2ART 36.2 41.2  PO2ART 149* 144.0*    Liver Enzymes  Recent Labs Lab 05/23/15 1348  AST 87*  ALT 47  ALKPHOS 34*  BILITOT 1.3*  ALBUMIN 2.0*    Cardiac Enzymes  Recent Labs Lab 05/23/15 1348  TROPONINI <0.03    Glucose  Recent Labs Lab 05/27/15 1638 05/27/15 1952 05/28/15 0004 05/28/15 0410 05/28/15 0827 05/28/15 1154  GLUCAP 155* 145* 141* 148* 126* 130*    Imaging Dg Abd Portable 1v  05/28/2015  CLINICAL DATA:  Feeding tube placement. EXAM: PORTABLE ABDOMEN - 1 VIEW COMPARISON:  Chest x-ray 05/24/2015 FINDINGS: Examination is limited  due to morbid obesity. There is a feeding tube coursing down the esophagus and into the stomach. This tip is not identified for certain. There are overlying lines. IMPRESSION: Limited examination. Feeding tube is coursing down the esophagus and into the stomach but the tip is not identified. Electronically Signed   By: Rudie MeyerP.  Gallerani M.D.   On: 05/28/2015 14:07    STUDIES:  Chest xray- 4/6- Bilat pulmonary infiltrates- edema, CHF. Bilat PNA cannot be excluded. Abd US- 4/5- No evidence of fluid collection in right and left lower  quadrant. Bilat Lower extrem US-Small superficial fluid collection along the medial left thigh which could reflect abscess. It measures 2.9 cm greatest dimension but only 6 mm in thickness. 2. No evidence of a right medial thigh abscess/fluid collection.3. Bilateral medial thigh nonspecific edema.   CULTURES: 4/4- Blood [facility]- Acinetobacter. 4/5 bc x 2>> Gram negative diplococci>> 4/5 uc>> No growth - Final 4/5 Wound Cultures >> Final ACINETOBACTER CALCOACETICUS/BAUMANNII COMPLEX  ANTIBIOTICS: 4/5 zoysn>>4/6 4/5 vanc>> 4/7 4/6 Meropenem >>   SIGNIFICANT EVENTS: 4/5 transferred to [facility] 4/5 LCB , no intubation, no cpr, no shock 4/6 Results of B/c- acinetobacter from [place]  4/6 Meropenem started.   LINES/TUBES: 4/4 left I J cvl>>   DISCUSSION: Super morbidly obese  female(850 lbs) who is a limited code. Been managed for Acinetobacter bacteremia, in septic shock requiring pressors. ABG with Non anion gap, metabolic acidosis- No Azotemia, has a lactic acidosis and concomittant respiratory acidosis - likely from OHS and OSA. Pt now on Meropenem. Started on PRN fentanyl. Also Oliguric, trial of diuretics- 4/9. Pt not requiring any respiratory support so far, just O2 by nasal canula.   ASSESSMENT / PLAN:  PULMONARY A: Acute hypoxemic hypercapnic resp fx 2/2 osa/sepsis/pulm edema P:   O2 as needed on nasal canula Consider Bipap for support prn DNR  CARDIOVASCULAR A:  Hypotension from presumed shock Atrial Fib with RVR on xalreto at home Tachycardia P:  Pressors as needed No rate control for now as she is on pressors Hold anticoag with low platelets If persistently greater than 130s consider rhythm control with amiodarone.   RENAL A:   Worsening AKI on CKD, Cr- 5.02  Oliguric Metabolic Acidosis Hyperkalemia ATN Likely P:   Monitor creatine, K Pressors Limit fluids D/c Bicarb drip  Hold bumex-for today  GASTROINTESTINAL A:   Morbidly  obese P:   Swallow eval, if passes then [facility] diet PPI  HEMATOLOGIC A:   On eliquis for chronic afib Thrombocytopenia- ?etiology P:  No anticoagulation 2/2 low platelet Pt has not gotten any heparin products here PBS  INFECTIOUS A:   Acinetobacter bacteremia and cellulitis Cellulitis lower panus infections, worsening,  Worsening leukocytosis P:   On meropenem increase dose to se if this will help with cellulitis,  Vanc and zosyn d/c No indication for surgery  Persistent GNR, repeat B/c in tomorrow to document clearance of infection.  ENDOCRINE A:   Monitor glucose  P:   SSI  NEUROLOGIC A:   AMS/confusion from sepsis and marked acidosis P:   RASS goal: 0 Careful with sedation. On [employer] Fentanyl prn for pain  FAMILY   - Updates: Palliative care seeing pt.  Inocente SallesEjiro Emokpae, MD. PGY-3 IMTS 2:35 PM 05/28/2015

## 2015-05-28 NOTE — Progress Notes (Addendum)
Initial Nutrition Assessment  DOCUMENTATION CODES:   Morbid obesity  INTERVENTION:    Start TF via Cortrak tube with Vital High Protein at 25 ml/h, after 8 hours, increase to goal rate of 50 ml/h with Prostat 30 ml TID to provide 1500 kcals, 150 gm protein, 1003 ml free water daily.  NUTRITION DIAGNOSIS:   Inadequate oral intake related to inability to eat as evidenced by NPO status.  GOAL:   Patient will meet greater than or equal to 90% of their needs  MONITOR:   TF tolerance, Labs, I & O's, Diet advancement  REASON FOR ASSESSMENT:   Rounds (start TF after Cortrak tube placed)    ASSESSMENT:   [name] who [facility] 4/4 withAMS, hypotension , lactic acidosis, elevated pro calcitonin, BC with GNR, draining pannus wounds. She was treated with abx and promptly [facility].   Labs reviewed. CBG's: [phone]  Discussed patient in ICU rounds and with RN today. Cortrak tube placed today. CCM physician okay with RD ordering TF. Patient alert during RD visit. She was eating PTA. S/P SLP evaluation this afternoon, SLP to follow-up on 4/11 for potential diet recommendations.  Nutrition-Focused physical exam completed. Findings are no fat depletion, no muscle depletion, and severe edema. Difficult to complete exam due to extreme obesity.   Diet Order:  Diet NPO time specified Except for: Ice Chips  Skin:  Reviewed, no issues  Last BM:  4/9  Height:   Ht Readings from Last 1 Encounters:  05/23/15 5\' 6"  (1.676 m)    Weight:   Wt Readings from Last 1 Encounters:  05/28/15 713 lb (323.415 kg)    Ideal Body Weight:  59.1 kg  BMI:  Body mass index is 115.14 kg/(m^2).  Estimated Nutritional Needs:   Kcal:  1500  Protein:  148 gm  Fluid:  >/= 2 L  EDUCATION NEEDS:   No education needs identified at this time  Joaquin CourtsKimberly Harris, RD, LDN, CNSC Pager [phone] After Hours Pager [phone]

## 2015-05-28 NOTE — Care Management Important Message (Signed)
Important Message  Patient Details  Name: Michele MowersSharon Rigor MRN: [id number] Date of Birth: 25-Nov-1970   Medicare Important Message Given:  Yes    Teighlor Korson Abena 05/28/2015, 12:11 PM

## 2015-05-28 NOTE — Progress Notes (Signed)
Pharmacy Antibiotic Note  Michele MowersSharon Weber is a 45 y.o. female admitted on 05/23/2015 with sepsis and now w/ GPC bacteremia.  Pharmacy has been consulted for vancomycin dosing.  Plan: Vancomycin 1750 IV every 24 hours.  Goal trough 15-20 mcg/mL.  Height: 5\' 6"  (167.6 cm) Weight: (!) 720 lb (326.59 kg) IBW/kg (Calculated) : 59.3  Temp (24hrs), Avg:98.4 F (36.9 C), Min:97.9 F (36.6 C), Max:98.6 F (37 C)   Recent Labs Lab 05/23/15 1348 05/24/15 0345 05/24/15 1430 05/25/15 0521 05/26/15 0415  WBC 8.1 10.4  --  14.7* 28.9*  CREATININE 3.42* 3.45*  --  3.84* 4.18*  LATICACIDVEN 4.5*  --   --   --   --   VANCORANDOM  --   --  34 30  --     Estimated Creatinine Clearance: 44.6 mL/min (by C-G formula based on Cr of 4.18).    No Known Allergies  Antimicrobials this admission: Levaquin PTA Vanc 4/4 >> 4/7  Zosyn 4/4 >> 4/6  Merrem 4/6 >>  Vanc 4/10 >>  Dose adjustments this admission: 4/6 vanc random = 34 mcg/mL (post 2500mg  on 4/5 at 1500)  4/7 vanc random = 30 mcg/mL (no further doses)   Microbiology results: Blood [facility] Sal[facility]([place]) - Acinetobacter (sensitive Zosyn, Merrem)  Abscess cx ([place]) - negative  4/5 BCx x2 - Acinetobacter (sensitive Unasyn, Primaxin, Zosyn)  4/5 UCx - negative  4/5 left thigh wound - Acinetobacter (sensitive Unasyn, Fortaz, Primaxin) 4/9 BCx: GPC   Thank you for allowing pharmacy to be a part of this patient's care.  Vernard GamblesVeronda Sameena Artus, PharmD, BCPS  05/28/2015 3:11 AM

## 2015-05-29 DIAGNOSIS — J9600 Acute respiratory failure, unspecified whether with hypoxia or hypercapnia: Secondary | ICD-10-CM

## 2015-05-29 LAB — CBC
HEMATOCRIT: 30.4 % — AB (ref 36.0–46.0)
Hemoglobin: 10.2 g/dL — ABNORMAL LOW (ref 12.0–15.0)
MCH: 30.8 pg (ref 26.0–34.0)
MCHC: 33.6 g/dL (ref 30.0–36.0)
MCV: 91.8 fL (ref 78.0–100.0)
PLATELETS: 66 10*3/uL — AB (ref 150–400)
RBC: 3.31 MIL/uL — AB (ref 3.87–5.11)
RDW: 16.1 % — ABNORMAL HIGH (ref 11.5–15.5)
WBC: 39.3 10*3/uL — ABNORMAL HIGH (ref 4.0–10.5)

## 2015-05-29 LAB — GLUCOSE, CAPILLARY
GLUCOSE-CAPILLARY: 116 mg/dL — AB (ref 65–99)
GLUCOSE-CAPILLARY: 135 mg/dL — AB (ref 65–99)
Glucose-Capillary: 137 mg/dL — ABNORMAL HIGH (ref 65–99)
Glucose-Capillary: 136 mg/dL — ABNORMAL HIGH (ref 65–99)
Glucose-Capillary: 135 mg/dL — ABNORMAL HIGH (ref 65–99)
Glucose-Capillary: 117 mg/dL — ABNORMAL HIGH (ref 65–99)

## 2015-05-29 LAB — COMPREHENSIVE METABOLIC PANEL
ALK PHOS: 86 U/L (ref 38–126)
ALT: 33 U/L (ref 14–54)
AST: 18 U/L (ref 15–41)
Albumin: 1.6 g/dL — ABNORMAL LOW (ref 3.5–5.0)
Anion gap: 14 (ref 5–15)
BILIRUBIN TOTAL: 0.7 mg/dL (ref 0.3–1.2)
BUN: 83 mg/dL — AB (ref 6–20)
CALCIUM: 7.3 mg/dL — AB (ref 8.9–10.3)
CHLORIDE: 100 mmol/L — AB (ref 101–111)
CO2: 25 mmol/L (ref 22–32)
CREATININE: 5.02 mg/dL — AB (ref 0.44–1.00)
GFR, EST AFRICAN AMERICAN: 11 mL/min — AB (ref >60)
GFR, EST NON AFRICAN AMERICAN: 10 mL/min — AB (ref >60)
Glucose, Bld: 149 mg/dL — ABNORMAL HIGH (ref 65–99)
Potassium: 4.6 mmol/L (ref 3.5–5.1)
Sodium: 139 mmol/L (ref 135–145)
TOTAL PROTEIN: 5.6 g/dL — AB (ref 6.5–8.1)

## 2015-05-29 LAB — SAVE SMEAR

## 2015-05-29 MED ORDER — AMIODARONE HCL 200 MG PO TABS
400.0000 mg | ORAL_TABLET | Freq: Two times a day (BID) | ORAL | Status: DC
  Administered 2015-05-29 – 2015-06-08 (×21): 400 mg via ORAL
  Filled 2015-05-29 (×23): qty 2

## 2015-05-29 NOTE — Progress Notes (Signed)
SLP Cancellation Note  Patient Details Name: Michele Weber MRN: [id number] DOB: 1970-06-27   Cancelled treatment:       Reason Eval/Treat Not Completed: Other (comment) Discussed pt status with RN who reports frequent throat clearing and attempts to masticate pills when meds presented whole with applesauce. Given ongoing concerns for dysphagia and high risk for aspiration, recommend FEES for an objective measure of swallow function. Will attempt if medical team in agreement.    Rocky CraftsKara E Terril Amaro MA, CCC-SLP [phone] 05/29/2015, 3:48 PM

## 2015-05-29 NOTE — Progress Notes (Signed)
Speech Language Pathology Treatment: Dysphagia  Patient Details Name: Marcie MowersSharon Elders MRN: [id number] DOB: 05-10-1970 Today's Date: 05/29/2015 Time: [id number] SLP Time Calculation (min) (ACUTE ONLY): 25 min  Assessment / Plan / Recommendation Clinical Impression  Pt seen for reassessment of swallow function this a.m. Pt positioned upright as her body allows and trialed with ice chips, thin water via teaspoon, cup and straw, applesauce and cracker. Pt consistently demonstrated wet vocal quality with thins although no s/s aspiration with other consistencies. Recommend: Continue NPO except for ice chips and snacks of puree-type consistency with the RN. Could consider taking meds whole with applesauce.  Will followup for ongoing reassessment with hopes of initiating a PO diet. Options for an objective measure of swallow function are limited due to body habitus. Discussed POC with pt and RN.   HPI HPI: Morbidly obese 850 lbs [facility] withAMS, hypotension , lactic acidosis, elevated pro calcitonin, BC with GNR, draining pannus wounds.      SLP Plan  Continue with current plan of care     Recommendations  Diet recommendations: Dysphagia 1 (puree) (snacks with RN) Medication Administration: Whole meds with puree Supervision: Staff to assist with self feeding Compensations: Slow rate;Small sips/bites;Clear throat after each swallow Postural Changes and/or Swallow Maneuvers:  (seated upright as possible)             Oral Care Recommendations: Oral care BID Follow up Recommendations: 24 hour supervision/assistance Plan: Continue with current plan of care     GO                Rocky CraftsKara E Tanis Hensarling MA, CCC-SLP pge [phone] 05/29/2015, 9:27 AM

## 2015-05-29 NOTE — Progress Notes (Signed)
Daily Progress Note   Patient Name: Michele Weber       Date: 05/29/2015 DOB: 09-Apr-1970  Age: 45 y.o. MRN#: [id number] Attending Physician: Louann Sjogren, MD Primary Care Physician: Thad Ranger, MD Admit Date: 05/23/2015  Reason for Consultation/Follow-up: Establishing goals of care  Subjective: Complains of pain in her legs.  Reports that her daughter is her [facility] and Lexicographer.  She does not have a living will but she would want her son and daughter to make those decisions anyway.  I spoke with her daughter Michele Weber [phone] - the number on face sheet is wrong).  We discussed her mother's bacteremia and her poor kidney function.  Dondra Prader said that [name]'s family does understand that she is very sick.    I spoke briefly with nephrology about [name] - given her size and inability to sit in an outpatient HD chair she would not be a candidate for hemodialysis.  I explained this to [facility] as well.  Interval Events: WBC and Creatinine are still very high.  Urine output appears variable.  Length of Stay: 6 days  Current Medications: Scheduled Meds:  . amiodarone  400 mg Oral BID  . antiseptic oral rinse  7 mL Mouth Rinse q12n4p  . chlorhexidine  15 mL Mouth Rinse BID  . feeding supplement (PRO-STAT SUGAR FREE 64)  30 mL Per Tube TID  . feeding supplement (VITAL HIGH PROTEIN)  1,000 mL Per Tube Q24H  . insulin aspart  0-9 Units Subcutaneous 6 times per day  . levothyroxine  25 mcg Intravenous Daily  . meropenem (MERREM) IV  2 g Intravenous Q12H  . pantoprazole (PROTONIX) IV  40 mg Intravenous Q24H  . silver sulfADIAZINE   Topical Daily    Continuous Infusions: . norepinephrine (LEVOPHED) Adult infusion    . vasopressin (PITRESSIN) infusion  - *FOR SHOCK* Stopped (05/28/15 0418)    PRN Meds: sodium chloride, fentaNYL (SUBLIMAZE) injection   Physical Exam     Massively obese pleasant AA female, lying in bed in ICU.  VSS CV unable to hear Resp:  No increased work of breathing Abdomen:  Soft, Nd, massive pannus Extremities:  Multiple large blisters (bullae?) noted on legs.  These are draining yellow fluid. [name] asked to have her head raised.  I held  the control up to her hand and asked her to push the button. She doesn't have the strength or desire (?) to operate the bed control for herself.    Vital Signs: BP 96/37 mmHg  Pulse 126  Temp(Src) 97.5 F (36.4 C) (Oral)  Resp 20  Ht 5\' 6"  (1.676 m)  Wt 317.518 kg (700 lb)  BMI 113.04 kg/m2  SpO2 99%  LMP 05/28/2015 SpO2: SpO2: 99 % O2 Device: O2 Device: Nasal Cannula O2 Flow Rate: O2 Flow Rate (L/min): 2 L/min  Intake/output summary:   Intake/Output Summary (Last 24 hours) at 05/29/15 1553 Last data filed at 05/29/15 1400  Gross per 24 hour  Intake 1307.9 ml  Output    835 ml  Net  472.9 ml   LBM: Last BM Date: 05/28/15 Baseline Weight: Weight: (!) 363 kg (800 lb 4.3 oz) Most recent weight: Weight: (!) 317.518 kg (700 lb)       Palliative Assessment/Data: Flowsheet Rows        Most Recent Value   Intake Tab    Referral Department  Hospitalist   Unit at Time of Referral  ICU   Palliative Care Primary Diagnosis  Sepsis/Infectious Disease   Date Notified  05/24/15   Palliative Care Type  New Palliative care   Reason for referral  Clarify Goals of Care   Date of Admission  05/19/15   Date first seen by Palliative Care  05/25/15   # of days Palliative referral response time  1 Day(s)   # of days IP prior to Palliative referral  5   Clinical Assessment    Palliative Performance Scale Score  20%   Pain Max last 24 hours  Not able to report   Pain Min Last 24 hours  Not able to report   Dyspnea Max Last 24 Hours  Not able to report   Dyspnea Min  Last 24 hours  Not able to report   Nausea Max Last 24 Hours  Not able to report   Nausea Min Last 24 Hours  Not able to report   Anxiety Max Last 24 Hours  Not able to report   Anxiety Min Last 24 Hours  Not able to report   Other Max Last 24 Hours  Not able to report   Psychosocial & Spiritual Assessment    Palliative Care Outcomes    Patient/Family meeting held?  Yes   Who was at the meeting?  daughter, pt's sister   Palliative Care follow-up planned  Yes, Facility      Additional Data Reviewed: CBC    Component Value Date/Time   WBC 39.3* 05/29/2015 0452   WBC 7.2 05/13/2013   RBC 3.31* 05/29/2015 0452   HGB 10.2* 05/29/2015 0452   HCT 30.4* 05/29/2015 0452   PLT 66* 05/29/2015 0452   MCV 91.8 05/29/2015 0452   MCH 30.8 05/29/2015 0452   MCHC 33.6 05/29/2015 0452   RDW 16.1* 05/29/2015 0452   LYMPHSABS 0.5* 05/23/2015 1348   MONOABS 0.7 05/23/2015 1348   EOSABS 0.0 05/23/2015 1348   BASOSABS 0.0 05/23/2015 1348    CMP     Component Value Date/Time   NA 139 05/29/2015 0452   NA 136* 05/13/2013   K 4.6 05/29/2015 0452   CL 100* 05/29/2015 0452   CO2 25 05/29/2015 0452   GLUCOSE 149* 05/29/2015 0452   BUN 83* 05/29/2015 0452   BUN 11 05/13/2013   CREATININE 5.02* 05/29/2015 0452   CREATININE 1.0 05/13/2013  CALCIUM 7.3* 05/29/2015 0452   PROT 5.6* 05/29/2015 0452   ALBUMIN 1.6* 05/29/2015 0452   AST 18 05/29/2015 0452   ALT 33 05/29/2015 0452   ALKPHOS 86 05/29/2015 0452   BILITOT 0.7 05/29/2015 0452   GFRNONAA 10* 05/29/2015 0452   GFRAA 11* 05/29/2015 0452       Problem List:  Patient Active Problem List   Diagnosis Date Noted  . Goals of care, counseling/discussion   . Acute renal failure with tubular necrosis (HCC)   . Palliative care encounter   . Acute respiratory failure (HCC)   . Cellulitis of left lower extremity   . Abscess   . Respiratory failure (HCC)   . Septic shock (HCC)   . Sepsis (HCC) 05/23/2015  . Unspecified hypothyroidism  10/04/2013  . Sepsis affecting skin 09/30/2013  . Coag negative Staphylococcus bacteremia 09/30/2013  . Hypotension 09/30/2013  . Edema 09/14/2013  . Atrial fibrillation with RVR (HCC) 08/11/2013  . Cellulitis and abscess of trunk 07/21/2013  . Abnormal weight gain 07/08/2013  . Anemia, unspecified 07/08/2013  . Encounter for long-term (current) use of other medications 07/04/2013  . Amenorrhea 06/07/2013  . Open wound of knee, leg (except thigh), and ankle, complicated 06/03/2013  . Cellulitis of leg, left 05/12/2013  . Lymphedema 05/12/2013  . Hypokalemia 05/12/2013  . Unspecified protein-calorie malnutrition (HCC) 05/12/2013  . Physical deconditioning 05/12/2013  . Morbid obesity (HCC)   . Lymphedema of both lower extremities   . Diastolic CHF (HCC)   . Vitamin D deficiency      Palliative Care Assessment & Plan    1.Code Status:  Limited code    Code Status Orders        Start     Ordered   05/23/15 1241  Limited resuscitation (code)   Continuous    Question Answer Comment  In the event of cardiac or respiratory ARREST: Initiate Code Blue, Call Rapid Response Yes   In the event of cardiac or respiratory ARREST: Perform CPR No   In the event of cardiac or respiratory ARREST: Perform Intubation/Mechanical Ventilation No   In the event of cardiac or respiratory ARREST: Use NIPPV/BiPAp only if indicated Yes   In the event of cardiac or respiratory ARREST: Administer ACLS medications if indicated Yes   In the event of cardiac or respiratory ARREST: Perform Defibrillation or Cardioversion if indicated No      05/23/15 1241    2. Goals of Care/Additional Recommendations:  Full scope of treatment  Desire for further Chaplaincy support: Not at this time.  [employer].  Psycho-social Needs: Caregiving  Support/Resources  3. Symptom Management:      1. Continue low dose fentanyl for pain.  4. Palliative Prophylaxis:   Bowel Regimen, Delirium Protocol, Frequent Pain  Assessment and Palliative Wound Care  5. Prognosis: Unable to determine.  If her kidneys do not rebound, she likely has less than 6 months.  6. Discharge Planning:  Skilled Nursing Facility for rehab with Palliative care service follow-up   Care plan was discussed with PCCM team and daughter Dondra Prader.  Thank you for allowing the Palliative Medicine Team to assist in the care of this patient.   Time In: 1:00 Time Out: 1:35 Total Time 35 Prolonged Time Billed NO        Stephani Police, PA-C  05/29/2015, 3:53 PM  Please contact Palliative Medicine Team phone at [phone] for questions and concerns.

## 2015-05-29 NOTE — Progress Notes (Signed)
PULMONARY / CRITICAL CARE MEDICINE   Name: Michele Weber MRN: [id number] DOB: 1970-09-18    ADMISSION DATE:  05/23/2015  REFERRING MD:  Duke Salvia  CHIEF COMPLAINT:  Hypotension  BRIEF Hx of Present Illness-   Morbidly obese 850 lbs presented to [facility] 4/4 withAMS, hypotension , lactic acidosis, elevated pro calcitonin, BC with GNR, draining pannus wounds. Treated empirically with abx and promptly transferred to [facility] 4/5 on levophed at 20 and dobutamine at 55 cc/hr which we will dc until SVO2 is checked. She has a history of A fib on Xarleto but being held as of now. Family consulted and changed her code status to LCB with no intubation, cpr or shock. Abx of Vancomycin and zosyn instituted pan cultured on admission, then switched to meropenem and Vanc D/c, as blood culture results grew Acinetobacter.  SUBJECTIVE:  Awake and alert. No pain. Wants to go out. She denies diarrhea or abdominal pain. No cough, no difficulty breathing.  Norepi off this am.  VITAL SIGNS: BP 92/78 mmHg  Pulse 127  Temp(Src) 98.4 F (36.9 C) (Oral)  Resp 17  Ht  (1.676 m)  Wt 700 lb (317.518 kg)  BMI 113.04 kg/m2  SpO2 100%  LMP 05/28/2015  HEMODYNAMICS:     VENTILATOR SETTINGS:  Not on Vent.  INTAKE / OUTPUT: I/O last 3 completed shifts: In: 2843.1 [I.V.:1801.4; NG/GT:241.7; IV Piggyback:800] Out: 1405 [Urine:1405]  PHYSICAL EXAMINATION: General: Morbidly obese, following commands, opens eyes spontaneously Neuro:  Awake. Oriented to person and place.  HEENT: no neck, oral mucosa appears slightly dry, Right IJ Cardiovascular: Distant heart sounds Lungs: Decreased air movement.  Abdomen:  Morbidly obese, Huge abd pannus, not tender Musculoskeletal:  Huge pannus on extremities Skin: left and right pannus with draining wounds, skin on pannus on bilat lower extremity-thighs, sloughed off, improved hyperpigmentation, Large bullae on pannus and left lower  extremity.  LABS:  BMET  Recent Labs Lab 05/26/15 0415 05/28/15 0410 05/29/15 0452  NA 137 138 139  K 5.0 4.8 4.6  CL 101 100* 100*  CO2 21* 24 25  BUN 64* 76* 83*  CREATININE 4.18* 5.02* 5.02*  GLUCOSE 139* 162* 149*    Electrolytes  Recent Labs Lab 05/23/15 1348 05/24/15 0345  05/26/15 0415 05/28/15 0410 05/29/15 0452  CALCIUM 7.0* 7.0*  < > 6.9* 7.2* 7.3*  MG 1.6* 1.6*  --  2.0 2.0  --   PHOS 5.0* 4.7*  --  4.5  --   --   < > = values in this interval not displayed.  CBC  Recent Labs Lab 05/26/15 0415 05/28/15 0410 05/29/15 0452  WBC 28.9* 39.1* 39.3*  HGB 10.4* 9.7* 10.2*  HCT 30.8* 29.6* 30.4*  PLT 48* 36* 66*    Sepsis Markers  Recent Labs Lab 05/23/15 1348  LATICACIDVEN 4.5*    ABG  Recent Labs Lab 05/23/15 1415 05/24/15 1139  PHART 7.176* 7.220*  PCO2ART 36.2 41.2  PO2ART 149* 144.0*    Liver Enzymes  Recent Labs Lab 05/23/15 1348 05/29/15 0452  AST 87* 18  ALT 47 33  ALKPHOS 34* 86  BILITOT 1.3* 0.7  ALBUMIN 2.0* 1.6*    Cardiac Enzymes  Recent Labs Lab 05/23/15 1348  TROPONINI <0.03    Glucose  Recent Labs Lab 05/28/15 0827 05/28/15 1154 05/28/15 1533 05/28/15 2007 05/28/15 2342 05/29/15 0352  GLUCAP 126* 130* 140* 126* 116* 135*    Imaging Dg Abd Portable 1v  05/28/2015  CLINICAL DATA:  Feeding tube placement.  EXAM: PORTABLE ABDOMEN - 1 VIEW COMPARISON:  Chest x-ray 05/24/2015 FINDINGS: Examination is limited due to morbid obesity. There is a feeding tube coursing down the esophagus and into the stomach. This tip is not identified for certain. There are overlying lines. IMPRESSION: Limited examination. Feeding tube is coursing down the esophagus and into the stomach but the tip is not identified. Electronically Signed   By: Rudie MeyerP.  Gallerani M.D.   On: 05/28/2015 14:07   STUDIES:  Chest xray- 4/6- Bilat pulmonary infiltrates- edema, CHF. Bilat PNA cannot be excluded. Abd US- 4/5- No evidence of fluid  collection in right and left lower quadrant. Bilat Lower extrem US-Small superficial fluid collection along the medial left thigh which could reflect abscess. It measures 2.9 cm greatest dimension but only 6 mm in thickness. 2. No evidence of a right medial thigh abscess/fluid collection.3. Bilateral medial thigh nonspecific edema.   CULTURES: 4/4- [facility]- Acinetobacter. 4/5 bc x 2>> Acinetobacter CAlcoaceticus/Baumannii Complex- Final. 4/5 uc>> No growth - Final 4/5 Wound Cultures >> Final ACINETOBACTER CALCOACETICUS/BAUMANNII COMPLEX 4/9 B/c X2- Gram negative Coccobaccilli>> 4/11 B/c >>  ANTIBIOTICS: 4/5 zoysn>>4/6 4/5 vanc>> 4/7 4/6 Meropenem >>   SIGNIFICANT EVENTS: 4/5 transferred to [facility] 4/5 LCB , no intubation, no cpr, no shock 4/6 Results of B/c- acinetobacter from [place]  4/6 Meropenem started.   LINES/TUBES: 4/4 left I J cvl>>   DISCUSSION: Super morbidly obese  female(850 lbs) who is a limited code. Been managed for Acinetobacter bacteremia, in septic shock requiring pressors. ABG with Non anion gap, metabolic acidosis- No Azotemia, has a lactic acidosis and concomittant respiratory acidosis - likely from OHS and OSA. Pt now on Meropenem. Started on PRN fentanyl. Also Oliguric, trial of diuretics- 4/9. Pt not requiring any respiratory support so far, just O2 by nasal canula, might bene  ASSESSMENT / PLAN:  PULMONARY A: Acute hypoxemic hypercapnic resp fx 2/2 osa/sepsis/pulm edema P:   O2 as needed on nasal canula Consider Bipap for support prn; ? Whether she would benefit from this qhs due to OSA / OHS DNR Transfer to step down today if stable off pressors, re-evaluate at 2pm.  CARDIOVASCULAR A:  Hypotension from presumed shock Atrial Fib with RVR on xalreto at home Tachycardia P:  Shock improved, now off pressors Hold anticoag with low platelets Load amiodarone and plan to continue daily dosing  RENAL A:   AKI on CKD, Cr-  5.02  Oliguric Metabolic Acidosis- Improved Hyperkalemia ATN Likely P:   Monitor creatinine, K Pressors Limit fluids Off Bicarb drip  Cont to Hold bumex If persistent Acidosis or oliguria or anuria- Consider consulting nephrology, although Poor candidate for HD  GASTROINTESTINAL A:   Morbidly obese P:   NG tube PPI  HEMATOLOGIC A:   On eliquis for chronic afib Thrombocytopenia- ?etiology likely sepsis Leukocytosis P:  No anticoagulation 2/2 low platelet Pt has not gotten any heparin products here Peripheral blood smear- pending  INFECTIOUS A:   Acinetobacter bacteremia and cellulitis Cellulitis lower panus infections- Stable to improving Worsening leukocytosis-39.3 P:   On meropenem dose double to 2g 4/10 Vanc and zosyn d/c No indication for surgery  Persistent GNR, rRepeat B/c today Acinetobacter not one of the HACEK orgs ,and does not commonly cause Endocarditis No suggestion of colitis or abscess at this time considering marked elevated WBC.. Leukocytosis likely due to persistent infection.  ENDOCRINE A:   Monitor glucose  P:   SSI  NEUROLOGIC A:   AMS/confusion from sepsis and  marked acidosis- Improved P:   RASS goal: 0 Careful with sedation. On MIND-USA Study Research Fentanyl prn for pain  IF REMAINS STABLE OFF PRESSORS, TRANSFER TO STEP DOWN TODAY, for hospitalist to take over care tomorrow- 4/12.     FAMILY   - Updates: Palliative care seeing pt. Updated patients daughter today- 4/11 at bedside.  Inocente Salles, MD. PGY-3 IMTS 7:53 AM 05/29/2015

## 2015-05-30 DIAGNOSIS — A498 Other bacterial infections of unspecified site: Secondary | ICD-10-CM | POA: Diagnosis present

## 2015-05-30 DIAGNOSIS — N179 Acute kidney failure, unspecified: Secondary | ICD-10-CM

## 2015-05-30 DIAGNOSIS — A499 Bacterial infection, unspecified: Secondary | ICD-10-CM | POA: Diagnosis present

## 2015-05-30 DIAGNOSIS — Z7189 Other specified counseling: Secondary | ICD-10-CM

## 2015-05-30 DIAGNOSIS — Z1624 Resistance to multiple antibiotics: Secondary | ICD-10-CM

## 2015-05-30 LAB — GLUCOSE, CAPILLARY
GLUCOSE-CAPILLARY: 101 mg/dL — AB (ref 65–99)
GLUCOSE-CAPILLARY: 104 mg/dL — AB (ref 65–99)
GLUCOSE-CAPILLARY: 124 mg/dL — AB (ref 65–99)
Glucose-Capillary: 122 mg/dL — ABNORMAL HIGH (ref 65–99)
Glucose-Capillary: 106 mg/dL — ABNORMAL HIGH (ref 65–99)
Glucose-Capillary: 109 mg/dL — ABNORMAL HIGH (ref 65–99)

## 2015-05-30 MED ORDER — SODIUM CHLORIDE 0.9 % IV SOLN: INTRAVENOUS | Status: DC

## 2015-05-30 MED ORDER — SODIUM CHLORIDE 0.9 % IV SOLN
INTRAVENOUS | Status: AC
  Administered 2015-05-30: 15:00:00 via INTRAVENOUS

## 2015-05-30 MED ORDER — SODIUM CHLORIDE 0.9 % IV SOLN
300.0000 mg | Freq: Once | INTRAVENOUS | Status: AC
  Administered 2015-05-30: 300 mg via INTRAVENOUS
  Filled 2015-05-30: qty 4

## 2015-05-30 NOTE — Procedures (Signed)
Objective Swallowing Evaluation: Type of Study: FEES-Fiberoptic Endoscopic Evaluation of Swallow  Patient Details  Name: Michele MowersSharon Weber MRN: [id number] Date of Birth: 01-13-1971  Today's Date: 05/30/2015 Time: SLP Start Time (ACUTE ONLY): 1005-SLP Stop Time (ACUTE ONLY): 1110 SLP Time Calculation (min) (ACUTE ONLY): 65 min  Past Medical History:  Past Medical History  Diagnosis Date  . Morbid obesity (HCC)   . Lymphedema of both lower extremities   . Diastolic CHF (HCC)   . Vitamin D deficiency    Past Surgical History: History reviewed. No pertinent past surgical history. HPI: Morbidly obese 850 lbs [facility] 4/4 withAMS, hypotension , lactic acidosis, elevated pro calcitonin, BC with GNR, draining pannus wounds.  Subjective: Pt agreeable to FEEs  Assessment / Plan / Recommendation  CHL IP CLINICAL IMPRESSIONS 05/30/2015  Therapy Diagnosis WFL  Clinical Impression FEES completed for an objective measure of swallow function due to persistent throat clearing at bedside with PO trials. Study was significant for presence of dried mucous and reddish thick mucous in the pharynx. Small blister-like lesion present at the posterior commissure possibly secondary to presence of NG tube. No aspiration observed with any consistency. Recommend: Dys 3 diet with thin liquid. Encourage small bites/sips and position upright as possible. Meds whole with thin. Will follow for tolerance and readiness for upgrade. Mental status and mucous appear to be primary barriers to safe PO intake.   Impact on safety and function Mild aspiration risk      CHL IP TREATMENT RECOMMENDATION 05/30/2015  Treatment Recommendations Therapy as outlined in treatment plan below     Prognosis 05/30/2015  Prognosis for Safe Diet Advancement Good  Barriers to Reach Goals Cognitive deficits  Barriers/Prognosis Comment --    CHL IP DIET RECOMMENDATION 05/30/2015  SLP Diet Recommendations Dysphagia 3 (Mech soft)  solids;Thin liquid  Liquid Administration via Cup;Straw  Medication Administration Whole meds with liquid  Compensations Slow rate;Small sips/bites  Postural Changes (No Data)      CHL IP OTHER RECOMMENDATIONS 05/30/2015  Recommended Consults --  Oral Care Recommendations Oral care BID  Other Recommendations --      CHL IP FOLLOW UP RECOMMENDATIONS 05/30/2015  Follow up Recommendations 24 hour supervision/assistance      CHL IP FREQUENCY AND DURATION 05/30/2015  Speech Therapy Frequency (ACUTE ONLY) min 1 x/week  Treatment Duration 1 week           CHL IP ORAL PHASE 05/30/2015  Oral Phase WFL  Oral - Pudding Teaspoon --  Oral - Pudding Cup --  Oral - Honey Teaspoon --  Oral - Honey Cup --  Oral - Nectar Teaspoon --  Oral - Nectar Cup --  Oral - Nectar Straw --  Oral - Thin Teaspoon --  Oral - Thin Cup --  Oral - Thin Straw --  Oral - Puree --  Oral - Mech Soft --  Oral - Regular --  Oral - Multi-Consistency --  Oral - Pill --  Oral Phase - Comment --    CHL IP PHARYNGEAL PHASE 05/30/2015  Pharyngeal Phase WFL  Pharyngeal- Pudding Teaspoon --  Pharyngeal --  Pharyngeal- Pudding Cup --  Pharyngeal --  Pharyngeal- Honey Teaspoon --  Pharyngeal --  Pharyngeal- Honey Cup --  Pharyngeal --  Pharyngeal- Nectar Teaspoon --  Pharyngeal --  Pharyngeal- Nectar Cup --  Pharyngeal --  Pharyngeal- Nectar Straw --  Pharyngeal --  Pharyngeal- Thin Teaspoon --  Pharyngeal --  Pharyngeal- Thin Cup --  Pharyngeal --  Pharyngeal- Thin Straw --  Pharyngeal --  Pharyngeal- Puree --  Pharyngeal --  Pharyngeal- Mechanical Soft --  Pharyngeal --  Pharyngeal- Regular --  Pharyngeal --  Pharyngeal- Multi-consistency --  Pharyngeal --  Pharyngeal- Pill --  Pharyngeal --  Pharyngeal Comment --     CHL IP CERVICAL ESOPHAGEAL PHASE 05/30/2015  Cervical Esophageal Phase WFL  Pudding Teaspoon --  Pudding Cup --  Honey Teaspoon --  Honey Cup --  Nectar Teaspoon --  Nectar  Cup --  Nectar Straw --  Thin Teaspoon --  Thin Cup --  Thin Straw --  Puree --  Mechanical Soft --  Regular --  Multi-consistency --  Pill --  Cervical Esophageal Comment --    No flowsheet data found.  Rocky Crafts MA, CCC-SLP Pager [phone] 05/30/2015, 12:18 PM

## 2015-05-30 NOTE — Progress Notes (Signed)
05/30/2015 Dr Rhona Leavenshiu was made aware at 1300 patient HR 119, 130's and 140's and blood pressure in the low 90's to 100's. Patient is asymptomatic. [facility] Marcelline Temkin RN.

## 2015-05-30 NOTE — Consult Note (Signed)
WOC wound consult note Reason for Consult: re-consulted for LLE skin sloughing, bulla rupture.  Patient with lymphedema, morbid obesity and cellulitis of the LLE  Wound type: cellulitis with sloughing of the skin and multiple of bulla Wound bed: large bullous areas that have ruptured and drained.  Since my last assessment she had a extremely large bulla on the mid medial thigh/knee area, this has since ruptured and the tissue is exposed large areas of tissue.  The tissues are clean, but draining heavily. The drainage is serosanguinous.  The demarcation has not changed much since I marked a week ago. She has a new bulla on the left dorsal foot.    Drainage (amount, consistency, odor) heavy, slight odor but it smells like blood not really an infectious process Periwound: lymphedema skin changes Dressing procedure/placement/frequency: Unfortunately this patient is going to be difficult to manage with her size, the extremity is not able to be lifted easily to assess and place any type of wrap like dressings.  I have ordered the addition of xeroform gauze to some of the new areas to attempt to dry them out a bit and for the antibacterial effects.  Using dry flow pads around the leg is our best option at this time.  She is not ambulatory. [facility] she is bed bound and has been for some time.  The chronic areas of the upper inner thigh and posterior leg have been managed with silvadene and these appear to be unchanged at this time. She has another new area on the lateral thigh but it also from a bulla rupture.   I have verified with portable equipment that she _______ on the largest bed for her weight and girth we have.  She will need bariatric low air loss when she returns to SNF/Hospice? due to highly draining wounds.   Discussed POC with patient and bedside nurse.  Re consult if needed, will not follow at this time. Thanks  Bahja Bence [employer] RN, CWOCN [phone])

## 2015-05-30 NOTE — Progress Notes (Signed)
PULMONARY / CRITICAL CARE MEDICINE   Name: Michele MowersSharon Loughney MRN: [id number] DOB: 02/17/1971    ADMISSION DATE:  05/23/2015  REFERRING MD:  Duke Salviaandolph  CHIEF COMPLAINT:  Hypotension  BRIEF Hx of Present Illness-   Morbidly obese 850 lbs [facility] 4/4 withAMS, hypotension , lactic acidosis, elevated pro calcitonin, BC with GNR, draining pannus wounds. Treated empirically with abx and promptly transferred to [facility] 4/5 on levophed at 20 and dobutamine at 55 cc/hr which we will dc until SVO2 is checked. She has a history of A fib on Xarleto but being held as of now. Family consulted and changed her code status to LCB with no intubation, cpr or shock. Abx of Vancomycin and zosyn instituted pan cultured on admission, then switched to meropenem and Vanc D/c, as blood culture results grew Acinetobacter.  SUBJECTIVE:  Awake, interactive an following commands, no new complaints, no events overnight.  VITAL SIGNS: BP 106/68 mmHg  Pulse 127  Temp(Src) 97.6 F (36.4 C) (Oral)  Resp 20  Ht 5\' 6"  (1.676 m)  Wt 303.91 kg (670 lb)  BMI 108.19 kg/m2  SpO2 100%  LMP 05/28/2015  HEMODYNAMICS:     VENTILATOR SETTINGS:  Not on Vent.  INTAKE / OUTPUT: I/O last 3 completed shifts: In: 2594 [P.O.:360; I.V.:561.5; NG/GT:1372.5; IV Piggyback:300] Out: 1485 [Urine:1485]  PHYSICAL EXAMINATION: General: Morbidly obese, following commands, opens eyes spontaneously Neuro:  Awake. Oriented to person and place.  HEENT: no neck, oral mucosa appears slightly dry, Right IJ Cardiovascular: Distant heart sounds Lungs: Decreased air movement.  Abdomen:  Morbidly obese, Huge abd pannus, not tender Musculoskeletal:  Huge pannus on extremities Skin: left and right pannus with draining wounds, skin on pannus on bilat lower extremity-thighs, sloughed off, improved hyperpigmentation, Large bullae on pannus and left lower extremity.  LABS:  BMET  Recent Labs Lab 05/26/15 0415  05/28/15 0410 05/29/15 0452  NA 137 138 139  K 5.0 4.8 4.6  CL 101 100* 100*  CO2 21* 24 25  BUN 64* 76* 83*  CREATININE 4.18* 5.02* 5.02*  GLUCOSE 139* 162* 149*    Electrolytes  Recent Labs Lab 05/23/15 1348 05/24/15 0345  05/26/15 0415 05/28/15 0410 05/29/15 0452  CALCIUM 7.0* 7.0*  < > 6.9* 7.2* 7.3*  MG 1.6* 1.6*  --  2.0 2.0  --   PHOS 5.0* 4.7*  --  4.5  --   --   < > = values in this interval not displayed.  CBC  Recent Labs Lab 05/26/15 0415 05/28/15 0410 05/29/15 0452  WBC 28.9* 39.1* 39.3*  HGB 10.4* 9.7* 10.2*  HCT 30.8* 29.6* 30.4*  PLT 48* 36* 66*    Sepsis Markers  Recent Labs Lab 05/23/15 1348  LATICACIDVEN 4.5*    ABG  Recent Labs Lab 05/23/15 1415 05/24/15 1139  PHART 7.176* 7.220*  PCO2ART 36.2 41.2  PO2ART 149* 144.0*    Liver Enzymes  Recent Labs Lab 05/23/15 1348 05/29/15 0452  AST 87* 18  ALT 47 33  ALKPHOS 34* 86  BILITOT 1.3* 0.7  ALBUMIN 2.0* 1.6*    Cardiac Enzymes  Recent Labs Lab 05/23/15 1348  TROPONINI <0.03    Glucose  Recent Labs Lab 05/29/15 1203 05/29/15 1534 05/29/15 2041 05/30/15 0054 05/30/15 0345 05/30/15 0737  GLUCAP 136* 135* 117* 101* 124* 122*    Imaging No results found. STUDIES:  Chest xray- 4/6- Bilat pulmonary infiltrates- edema, CHF. Bilat PNA cannot be excluded. Abd US- 4/5- No evidence of fluid collection  in right and left lower quadrant. Bilat Lower extrem US-Small superficial fluid collection along the medial left thigh which could reflect abscess. It measures 2.9 cm greatest dimension but only 6 mm in thickness. 2. No evidence of a right medial thigh abscess/fluid collection.3. Bilateral medial thigh nonspecific edema.   CULTURES: 4/4- Blood culture results from [facility]- Acinetobacter. 4/5 bc x 2>> Acinetobacter CAlcoaceticus/Baumannii Complex- Final. 4/5 uc>> No growth - Final 4/5 Wound Cultures >> Final ACINETOBACTER CALCOACETICUS/BAUMANNII COMPLEX 4/9 B/c  X2- Gram negative Coccobaccilli>> 4/11 B/c >>  ANTIBIOTICS: 4/5 zoysn>>4/6 4/5 vanc>> 4/7 4/6 Meropenem >>   SIGNIFICANT EVENTS: 4/5 transferred to [facility] 4/5 LCB , no intubation, no cpr, no shock 4/6 Results of B/c- acinetobacter from   4/6 Meropenem started.   LINES/TUBES: 4/4 left I J cvl>>   I reviewed CXR myself, TLC ok, difficult to evaluate lung parenchyma given size.  DISCUSSION: Super morbidly obese  female(850 lbs) who is a limited code. Been managed for Acinetobacter bacteremia, in septic shock requiring pressors. ABG with Non anion gap, metabolic acidosis- No Azotemia, has a lactic acidosis and concomittant respiratory acidosis - likely from OHS and OSA. Pt now on Meropenem. Started on PRN fentanyl. Also Oliguric, trial of diuretics- 4/9. Pt not requiring any respiratory support so far, just O2 by nasal canula, might bene  ASSESSMENT / PLAN:  PULMONARY A: Acute hypoxemic hypercapnic resp fx 2/2 osa/sepsis/pulm edema P:   O2 as needed on nasal canula CPAP QHS, can make BiPAP available PRN at this point but not necessary right now. DNR  CARDIOVASCULAR A:  Hypotension from presumed shock Atrial Fib with RVR on xalreto at home Tachycardia P:  Shock improved, now off pressors Hold anticoag with low platelets Load amiodarone and plan to continue daily dosing Insure that BP measurement is accurate prior to restarting any pressors.  RENAL A:   AKI on CKD, Cr- 5.02  Oliguric Metabolic Acidosis- Improved Hyperkalemia ATN Likely P:   Monitor creatinine, K Pressors Limit fluids Off Bicarb drip  Cont to Hold bumex If persistent Acidosis or oliguria or anuria- Consider consulting nephrology, although Poor candidate for HD  GASTROINTESTINAL A:   Morbidly obese P:   NG tube PPI  HEMATOLOGIC A:   On eliquis for chronic afib Thrombocytopenia- ?etiology likely sepsis Leukocytosis P:  No anticoagulation 2/2 low platelet Pt has not gotten any  heparin products here Do not believe this is HIT.  INFECTIOUS A:   Acinetobacter bacteremia and cellulitis Cellulitis lower panus infections- Stable to improving Worsening leukocytosis-39.3 P:   On meropenem dose double to 2g 4/10 Vanc and zosyn d/c No indication for surgery  Persistent GNR, rRepeat B/c today Acinetobacter not one of the HACEK orgs ,and does not commonly cause Endocarditis No suggestion of colitis or abscess at this time considering marked elevated WBC.. Leukocytosis likely due to persistent infection.  ENDOCRINE A:   Monitor glucose  P:   SSI  NEUROLOGIC A:   AMS/confusion from sepsis and marked acidosis- Improved P:   Careful with sedation and respiratory failure. On MIND-[place] Study Research Fentanyl prn for pain  FAMILY   - Updates: Palliative care seeing pt. Updated patient and daughters bedside.  Discussed with PCCM-NP, palliative care following, recommend full DNR at this point.  PCCM will sign off, please call back if needed.  Alyson Reedy, M.D. [facility] Pulmonary/Critical Care Medicine. Pager: [phone]. After hours pager: [phone].  11:58 AM 05/30/2015

## 2015-05-30 NOTE — Progress Notes (Signed)
[facility] PROGRESS NOTE  Michele Weber ZOX:[id number] DOB: [birth year] DOA: 05/23/2015 PCP: Thad Ranger, MD  HPI/Brief narrative Morbidly obese 850lb pt who presented initially with AMS and hypotension with presenting sepsis with GNR bacteremia.  Assessment/Plan: 1. Gm neg bacteremia (acinetobacter) with sepsis present on admission 1. WBC remains elevated at over 39k with continued tachycardia 2. Appreciate input by ID. Recs to continue meropenem with one dose of colistin given 3. Overall poor prognosis 2. Morbid obesity 1. Stable thus far 3. Hypoxemic hypercapnic resp failure secondary to morbid obesity 1. On RA currently 2. Per Pulmonary, CPAP QHS and can make PRN BiPAP. No intubation 4. Afib with RVR 1. Tachycardic. Suspect worsened by active sepsis 2. Given gentle IVF today, but would avoid over-hydration 5. Acute on CKD 1. Per above, given gentle IVF 2. Will repeat labs in AM 6. Altered mental status 1. Seems improved 7. Bulla ruptures 1. Noted in multiple areas under the patient 2. Do not appear infected at present with good granulation seen 3. Appreciate input by WOC. Recs noted 8. DVT prophylaxis 1. SCD's  Code Status: No intubation or chest compressions Family Communication: Pt in room Disposition Plan: Uncertain at this time   Consultants:  ID  Critical Care  Procedures:    Antibiotics: Anti-infectives    Start     Dose/Rate Route Frequency Ordered Stop   05/30/15 1800  colistimethate (COLYMYCIN) 300 mg in sodium chloride 0.9 % 100 mL IVPB     300 mg 208 mL/hr over 30 Minutes Intravenous  Once 05/30/15 1716 05/30/15 1847   05/30/15 0405  vancomycin (VANCOCIN) 2,000 mg in sodium chloride 0.9 % 500 mL IVPB  Status:  Discontinued     2,000 mg 250 mL/hr over 120 Minutes Intravenous Every 48 hours 05/28/15 0842 05/28/15 1334   05/28/15 1700  meropenem (MERREM) 2 g in sodium chloride 0.9 % 100 mL IVPB     2 g 200 mL/hr over 30 Minutes Intravenous  Every 12 hours 05/28/15 0841     05/28/15 0400  vancomycin (VANCOCIN) 1,750 mg in sodium chloride 0.9 % 500 mL IVPB  Status:  Discontinued     1,750 mg 250 mL/hr over 120 Minutes Intravenous Every 24 hours 05/28/15 0330 05/28/15 0842   05/25/15 1700  meropenem (MERREM) 1 g in sodium chloride 0.9 % 100 mL IVPB  Status:  Discontinued     1 g 200 mL/hr over 30 Minutes Intravenous Every 12 hours 05/25/15 1255 05/28/15 0841   05/24/15 0945  meropenem (MERREM) 1 g in sodium chloride 0.9 % 100 mL IVPB  Status:  Discontinued     1 g 200 mL/hr over 30 Minutes Intravenous 3 times per day 05/24/15 0939 05/25/15 1255   05/23/15 2200  piperacillin-tazobactam (ZOSYN) IVPB 3.375 g  Status:  Discontinued     3.375 g 12.5 mL/hr over 240 Minutes Intravenous 3 times per day 05/23/15 1257 05/24/15 0939   05/23/15 1300  vancomycin (VANCOCIN) 2,500 mg in sodium chloride 0.9 % 500 mL IVPB     2,500 mg 250 mL/hr over 120 Minutes Intravenous  Once 05/23/15 1257 05/23/15 1656   05/23/15 1300  piperacillin-tazobactam (ZOSYN) IVPB 3.375 g     3.375 g 100 mL/hr over 30 Minutes Intravenous  Once 05/23/15 1257 05/23/15 1417      HPI/Subjective: No complaints  Objective: Filed Vitals:   05/30/15 1400 05/30/15 1500 05/30/15 1600 05/30/15 1700  BP: 109/81 105/79 111/81 113/90  Pulse: 123 118 126 128  Temp:  97.8  F (36.6 C)    TempSrc:  Oral    Resp: Height:      Weight:      SpO2: 97% 99% 99% 100%    Intake/Output Summary (Last 24 hours) at 05/30/15 1931 Last data filed at 05/30/15 1817  Gross per 24 hour  Intake 2287.66 ml  Output   1200 ml  Net 1087.66 ml   Filed Weights   05/28/15 0426 05/29/15 0446 05/30/15 0500  Weight: 323.415 kg (713 lb) 317.518 kg (700 lb) 303.91 kg (670 lb)    Exam:   General:  Awake, in nad  Cardiovascular: tachycardic, s1, s2  Respiratory: normal resp effort, no wheezing  Abdomen: morbidly obese, nondistended  Musculoskeletal: perfused, no  clubbing   Data Reviewed: Basic Metabolic Panel:  Recent Labs Lab 05/24/15 0345 05/25/15 0521 05/26/15 0415 05/28/15 0410 05/29/15 0452  NA 134* 133* 137 138 139  K 5.2* 5.0 5.0 4.8 4.6  CL 103 99* 101 100* 100*  CO2 16* 19* 21* 24 25  GLUCOSE 151* 123* 139* 162* 149*  BUN 52* 60* 64* 76* 83*  CREATININE 3.45* 3.84* 4.18* 5.02* 5.02*  CALCIUM 7.0* 6.9* 6.9* 7.2* 7.3*  MG 1.6*  --  2.0 2.0  --   PHOS 4.7*  --  4.5  --   --    Liver Function Tests:  Recent Labs Lab 05/29/15 0452  AST 18  ALT 33  ALKPHOS 86  BILITOT 0.7  PROT 5.6*  ALBUMIN 1.6*   No results for input(s): LIPASE, AMYLASE in the last 168 hours. No results for input(s): AMMONIA in the last 168 hours. CBC:  Recent Labs Lab 05/24/15 0345 05/25/15 0521 05/26/15 0415 05/28/15 0410 05/29/15 0452  WBC 10.4 14.7* 28.9* 39.1* 39.3*  HGB 11.9* 11.1* 10.4* 9.7* 10.2*  HCT 37.0 32.9* 30.8* 29.6* 30.4*  MCV 94.6 92.4 88.8 91.4 91.8  PLT 58* 35* 48* 36* 66*   Cardiac Enzymes: No results for input(s): CKTOTAL, CKMB, CKMBINDEX, TROPONINI in the last 168 hours. BNP (last 3 results) No results for input(s): BNP in the last 8760 hours.  ProBNP (last 3 results) No results for input(s): PROBNP in the last 8760 hours.  CBG:  Recent Labs Lab 05/30/15 0054 05/30/15 0345 05/30/15 0737 05/30/15 1150 05/30/15 1634  GLUCAP 101* 124* 122* 106* 104*    Recent Results (from the past 240 hour(s))  MRSA PCR Screening     Status: None   Collection Time: 05/23/15  1:42 PM  Result Value Ref Range Status   MRSA by PCR NEGATIVE NEGATIVE Final    Comment:        The GeneXpert MRSA Assay (FDA approved for NASAL specimens only), is one component of a comprehensive MRSA colonization surveillance program. It is not intended to diagnose MRSA infection nor to guide or monitor treatment for MRSA infections.   Culture, blood (routine x 2)     Status: Abnormal   Collection Time: 05/23/15  2:53 PM  Result Value  Ref Range Status   Specimen Description BLOOD RIGHT ARM  Final   Special Requests IN PEDIATRIC BOTTLE  3CC  Final   Culture  Setup Time (A)  Final    GRAM NEGATIVE DIPLOCOCCI AEROBIC BOTTLE ONLY CRITICAL RESULT CALLED TO, READ BACK BY AND VERIFIED WITH: C WOFFORD,RN  05/24/15 MKELLY CORRECTED RESULTS GRAM NEGATIVE RODS CORRECTED RESULTS CALLED TO: J. SMITH,RN AT 9147 ON [id number] BY Lucienne Capers    Culture (A)  Final  ACINETOBACTER CALCOACETICUS/BAUMANNII COMPLEX SUSCEPTIBILITIES PERFORMED ON PREVIOUS CULTURE WITHIN THE LAST 5 DAYS.    Report Status 05/26/2015 FINAL  Final  Culture, blood (routine x 2)     Status: Abnormal   Collection Time: 05/23/15  3:05 PM  Result Value Ref Range Status   Specimen Description BLOOD LEFT ARM  Final   Special Requests IN PEDIATRIC BOTTLE 3CC  Final   Culture  Setup Time (A)  Final    GRAM NEGATIVE DIPLOCOCCI AEROBIC BOTTLE ONLY CRITICAL RESULT CALLED TO, READ BACK BY AND VERIFIED WITH: C WOFFORD,RN @0415  05/24/15 MKELLY CORRECTED RESULTS GRAM NEGATIVE RODS CORRECTED RESULTS CALLED TO: J. SMITH,RN AT [id number] ON [id number] BY Lucienne CapersS. YARBROUGH    Culture ACINETOBACTER CALCOACETICUS/BAUMANNII COMPLEX (A)  Final   Report Status 05/26/2015 FINAL  Final   Organism ID, Bacteria ACINETOBACTER CALCOACETICUS/BAUMANNII COMPLEX  Final      Susceptibility   Acinetobacter calcoaceticus/baumannii complex - MIC*    CEFTAZIDIME 8 SENSITIVE Sensitive     CEFTRIAXONE 16 INTERMEDIATE Intermediate     CIPROFLOXACIN >=4 RESISTANT Resistant     GENTAMICIN >=16 RESISTANT Resistant     IMIPENEM 0.5 SENSITIVE Sensitive     PIP/TAZO 16 SENSITIVE Sensitive     TRIMETH/SULFA >=320 RESISTANT Resistant     AMPICILLIN/SULBACTAM <=2 SENSITIVE Sensitive     * ACINETOBACTER CALCOACETICUS/BAUMANNII COMPLEX  Urine culture     Status: None   Collection Time: 05/23/15  6:04 PM  Result Value Ref Range Status   Specimen Description URINE, CATHETERIZED  Final   Special Requests NONE   Final   Culture NO GROWTH 1 DAY  Final   Report Status 05/24/2015 FINAL  Final  Wound culture     Status: None   Collection Time: 05/23/15  6:04 PM  Result Value Ref Range Status   Specimen Description WOUND LEFT THIGH  Final   Special Requests NONE  Final   Gram Stain   Final    NO WBC SEEN NO SQUAMOUS EPITHELIAL CELLS SEEN NO ORGANISMS SEEN [facility]    Culture   Final    ABUNDANT ACINETOBACTER CALCOACETICUS/BAUMANNII COMPLEX Performed at [facility]    Report Status 05/26/2015 FINAL  Final   Organism ID, Bacteria ACINETOBACTER CALCOACETICUS/BAUMANNII COMPLEX  Final      Susceptibility   Acinetobacter calcoaceticus/baumannii complex - MIC*    AMPICILLIN >=32 RESISTANT Resistant     AMPICILLIN/SULBACTAM <=2 SENSITIVE Sensitive     CEFAZOLIN >=64 RESISTANT Resistant     CEFTAZIDIME 4 SENSITIVE Sensitive     CEFTRIAXONE 16 INTERMEDIATE Intermediate     CIPROFLOXACIN >=4 RESISTANT Resistant     GENTAMICIN >=16 RESISTANT Resistant     IMIPENEM 1 SENSITIVE Sensitive     PIP/TAZO 32 INTERMEDIATE Intermediate     TOBRAMYCIN 8 INTERMEDIATE Intermediate     TRIMETH/SULFA Value in next row Resistant      >=320 RESISTANT(NOTE)    * ABUNDANT ACINETOBACTER CALCOACETICUS/BAUMANNII COMPLEX  Culture, blood (routine x 2)     Status: Abnormal (Preliminary result)   Collection Time: 05/27/15 12:01 PM  Result Value Ref Range Status   Specimen Description BLOOD CENTRAL LINE  Final   Special Requests BOTTLES DRAWN AEROBIC AND ANAEROBIC 10CC  Final   Culture  Setup Time   Final    GRAM NEGATIVE COCCOBACILLI IN BOTH AEROBIC AND ANAEROBIC BOTTLES CRITICAL RESULT CALLED TO, READ BACK BY AND VERIFIED WITH: TO BSHEPARD(RN) BY TCLEVELAND 05/28/2015 AT 2:52AM CORRECTED RESULTS CALLED TO: Mohammed KindleL WILSON,RN AT [id number]  05/28/15 BY L BENFIELD PREVIOUSLY REPORTED AS GRAM POSITIVE COCCI    Culture (A)  Final    ACINETOBACTER CALCOACETICUS/BAUMANNII COMPLEX SUSCEPTIBILITIES TO  FOLLOW    Report Status PENDING  Incomplete  Culture, blood (routine x 2)     Status: Abnormal (Preliminary result)   Collection Time: 05/27/15 12:07 PM  Result Value Ref Range Status   Specimen Description BLOOD RIGHT HAND  Final   Special Requests BOTTLES DRAWN AEROBIC ONLY 5CC  Final   Culture  Setup Time   Final    GRAM NEGATIVE COCCOBACILLI AEROBIC BOTTLE ONLY CRITICAL RESULT CALLED TO, READ BACK BY AND VERIFIED WITH: TO BSHEPARD (RN) BY TCLEVELAND AT 2:54AM CORRECTED RESULTS CALLED TO: L WILSON,RN AT 1610 05/28/15 BY L BENFIELD PREVIOUSLY REPORTED AS GRAM POSITIVE COCCI    Culture (A)  Final    ACINETOBACTER CALCOACETICUS/BAUMANNII COMPLEX SUSCEPTIBILITIES TO FOLLOW    Report Status PENDING  Incomplete  Culture, blood (Routine X 2) w Reflex to ID Panel     Status: None (Preliminary result)   Collection Time: 05/29/15  9:43 AM  Result Value Ref Range Status   Specimen Description BLOOD LEFT ARM  Final   Special Requests IN PEDIATRIC BOTTLE  4CC  Final   Culture NO GROWTH 1 DAY  Final   Report Status PENDING  Incomplete  Culture, blood (Routine X 2) w Reflex to ID Panel     Status: None (Preliminary result)   Collection Time: 05/29/15  9:50 AM  Result Value Ref Range Status   Specimen Description BLOOD LEFT ARM  Final   Special Requests IN PEDIATRIC BOTTLE  4CC  Final   Culture NO GROWTH 1 DAY  Final   Report Status PENDING  Incomplete     Studies: No results found.  Scheduled Meds: . amiodarone  400 mg Oral BID  . antiseptic oral rinse  7 mL Mouth Rinse q12n4p  . chlorhexidine  15 mL Mouth Rinse BID  . feeding supplement (PRO-STAT SUGAR FREE 64)  30 mL Per Tube TID  . insulin aspart  0-9 Units Subcutaneous 6 times per day  . levothyroxine  25 mcg Intravenous Daily  . meropenem (MERREM) IV  2 g Intravenous Q12H  . pantoprazole (PROTONIX) IV  40 mg Intravenous Q24H  . silver sulfADIAZINE   Topical Daily   Continuous Infusions: . norepinephrine (LEVOPHED) Adult  infusion    . vasopressin (PITRESSIN) infusion - *FOR SHOCK* Stopped (05/28/15 9604)    Principal Problem:   MDR Acinetobacter baumannii infection Active Problems:   Morbid obesity (HCC)   Lymphedema of both lower extremities   Cellulitis and abscess of trunk   Sepsis (HCC)   Septic shock (HCC)   Acute respiratory failure (HCC)   Palliative care encounter   Goals of care, counseling/discussion   Acute renal failure with tubular necrosis (HCC)    Keely Drennan K  [facility] Pager [phone]. If 7PM-7AM, please contact night-coverage at [email or web address], password [facility] 05/30/2015, 7:31 PM  LOS: 7 days

## 2015-05-30 NOTE — Progress Notes (Signed)
[facility]    Date of Admission:  05/23/2015    Total days of antibiotics 7        Day 7 meropenem        4 days of vancomycin        3 days of piperacillin-tazobactam       Reason for Consult: Persistently positive blood cultures for Acinetobacter with increasing resistance with each isolate    Referring Physician: Dr. Rhona Leavens  Principal Problem:   MDR Acinetobacter baumannii infection Active Problems:   Morbid obesity (HCC)   Lymphedema of both lower extremities   Cellulitis and abscess of trunk   Sepsis (HCC)   Septic shock (HCC)   Acute respiratory failure (HCC)   Palliative care encounter   Goals of care, counseling/discussion   Acute renal failure with tubular necrosis (HCC)   . amiodarone  400 mg Oral BID  . antiseptic oral rinse  7 mL Mouth Rinse q12n4p  . chlorhexidine  15 mL Mouth Rinse BID  . feeding supplement (PRO-STAT SUGAR FREE 64)  30 mL Per Tube TID  . insulin aspart  0-9 Units Subcutaneous 6 times per day  . levothyroxine  25 mcg Intravenous Daily  . meropenem (MERREM) IV  2 g Intravenous Q12H  . pantoprazole (PROTONIX) IV  40 mg Intravenous Q24H  . silver sulfADIAZINE   Topical Daily    Recommendations: 1. Continue meropenem for now 2. Give one dose of colistin 3. We will see how she does overnight clinically and reassess her cultures and susceptibility data   Assessment: Ms. Utecht has a very severe blood stream infection with a multi-drug resistant Acinetobacter that is rapidly developing resistance each day.  I did call the microbiology lab about the susceptibilities on the blood cultures from 4/9 - they informed me that they are rerunning the panel because it was a highly resistant organism.  They then told me that preliminary susceptibilities revealed that the organism is now resistant to all drugs tested (imipenem, ampicillin-sulbactam, piperacillin-tazobactam, etc) and intermediate to only ceftazidime and  tobramycin.  They are redoing the susceptibilities, but I suspect that these susceptibilities are true.  I have asked that additional antibiotics be tested - ceftazidime-avibactam, colistin, polymyxin B, amikacin, and tigecycline. Ms. Wisser has also developed acute kidney failure and her creatinine is 5.02 today, and unfortunately most of the antibiotics we can use to treat this infection will likely impact her kidney function.  I have discussed her options with her and have decided to continue the meropenem for now and add a one-time dose of colistin for tonight. Colistin is likely to be susceptible but comes with a huge risk of renal failure - not the best option in someone who already has acute renal failure and who is not a candidate for dialysis.  We will follow-up early tomorrow morning to see how she does over night clinically, and follow-up on the susceptibilities that should be back tomorrow morning. We will follow along with you.    HPI: Ayn Domangue is a 45 y.o. female with diastolic congestive heart failure, vitamin D deficiency, morbid obesity, and chronic lymphedema of both lower extremities who usually resides in a skilled nursing facility.  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  She was started on vancomycin and piperacillin-tazobactam in  the emergency department. The blood culture obtained from [facility] on 4/4 did grow Acinetobacter that was susceptible to both piperacillin-tazobactam and imipenem, and repeat blood cultures obtained here on admission again grew Acinetobacter that was susceptible to ampicillin-sulbactam, ceftazidime, imipenem, and piperacillin-tazobactam. She was eventually transitioned to meropenem on 4/6 and has been receiving that since.  Repeat blood cultures from 4/9 are again growing  Acinetobacter.    Review of Systems: Review of Systems  Unable to perform ROS: acuity of condition  Constitutional:       She is alert now but has very little recollection of the events of the past 10 days.    Past Medical History  Diagnosis Date  . Morbid obesity (HCC)   . Lymphedema of both lower extremities   . Diastolic CHF (HCC)   . Vitamin D deficiency     Social History  Substance Use Topics  . Smoking status: Never Smoker   . Smokeless tobacco: None  . Alcohol Use: No    Family History  Problem Relation Age of Onset  . Diabetes Father    No Known Allergies  OBJECTIVE: Blood pressure 105/79, pulse 118, temperature 97.8 F (36.6 C), temperature source Oral, resp. rate 16, height  (1.676 m), weight 670 lb (303.91 kg), last menstrual period 05/28/2015, SpO2 99 %.  Physical Exam  Constitutional: She is oriented to person, place, and time.  She is alert and resting comfortably in bed. Her daughter is visiting. She is morbidly obese.  Eyes: Conjunctivae are normal.  Cardiovascular: Normal rate and regular rhythm.   No murmur heard. Distant heart sounds.  Pulmonary/Chest: Breath sounds normal. She has no wheezes. She has no rales.  Abdominal: Soft. There is no tenderness.  Neurological: She is alert and oriented to person, place, and time.  Skin:  She has extensive pannus of her abdomen and lower extremities with multiple skin folds. There are multiple areas of maceration, blistering and skin loss. There is extensive drainage on the dressings and mattress cover. There is a slight foul odor. The extent and weight of the massive skinfold makes thorough examination virtually impossible.  Psychiatric: Mood and affect normal.    Lab Results Lab Results  Component Value Date   WBC 39.3* 05/29/2015   HGB 10.2* 05/29/2015   HCT 30.4* 05/29/2015   MCV 91.8 05/29/2015   PLT 66* 05/29/2015    Lab Results  Component Value Date   CREATININE 5.02* 05/29/2015   BUN  83* 05/29/2015   NA 139 05/29/2015   K 4.6 05/29/2015   CL 100* 05/29/2015   CO2 25 05/29/2015    Lab Results  Component Value Date   ALT 33 05/29/2015   AST 18 05/29/2015   ALKPHOS 86 05/29/2015   BILITOT 0.7 05/29/2015     Microbiology: Recent Results (from the past 240 hour(s))  MRSA PCR Screening     Status: None   Collection Time: 05/23/15  1:42 PM  Result Value Ref Range Status   MRSA by PCR NEGATIVE NEGATIVE Final    Comment:        The GeneXpert MRSA Assay (FDA approved for NASAL specimens only), is one component of a comprehensive MRSA colonization surveillance program. It is not intended to diagnose MRSA infection nor to guide or monitor treatment for MRSA infections.   Culture, blood (routine x 2)     Status: Abnormal   Collection Time: 05/23/15  2:53 PM  Result Value Ref Range Status   Specimen Description BLOOD RIGHT  ARM  Final   Special Requests IN PEDIATRIC BOTTLE  3CC  Final   Culture  Setup Time (A)  Final    GRAM NEGATIVE DIPLOCOCCI AEROBIC BOTTLE ONLY CRITICAL RESULT CALLED TO, READ BACK BY AND VERIFIED WITH: C WOFFORD,RN @0415  05/24/15 MKELLY CORRECTED RESULTS GRAM NEGATIVE RODS CORRECTED RESULTS CALLED TO: J. SMITH,RN AT 1610 ON [id number] BY Lucienne Capers    Culture (A)  Final    ACINETOBACTER CALCOACETICUS/BAUMANNII COMPLEX SUSCEPTIBILITIES PERFORMED ON PREVIOUS CULTURE WITHIN THE LAST 5 DAYS.    Report Status 05/26/2015 FINAL  Final  Culture, blood (routine x 2)     Status: Abnormal   Collection Time: 05/23/15  3:05 PM  Result Value Ref Range Status   Specimen Description BLOOD LEFT ARM  Final   Special Requests IN PEDIATRIC BOTTLE 3CC  Final   Culture  Setup Time (A)  Final    GRAM NEGATIVE DIPLOCOCCI AEROBIC BOTTLE ONLY CRITICAL RESULT CALLED TO, READ BACK BY AND VERIFIED WITH: C WOFFORD,RN @0415  05/24/15 MKELLY CORRECTED RESULTS GRAM NEGATIVE RODS CORRECTED RESULTS CALLED TO: J. SMITH,RN AT 0981 ON [id number] BY Lucienne Capers    Culture  ACINETOBACTER CALCOACETICUS/BAUMANNII COMPLEX (A)  Final   Report Status 05/26/2015 FINAL  Final   Organism ID, Bacteria ACINETOBACTER CALCOACETICUS/BAUMANNII COMPLEX  Final      Susceptibility   Acinetobacter calcoaceticus/baumannii complex - MIC*    CEFTAZIDIME 8 SENSITIVE Sensitive     CEFTRIAXONE 16 INTERMEDIATE Intermediate     CIPROFLOXACIN >=4 RESISTANT Resistant     GENTAMICIN >=16 RESISTANT Resistant     IMIPENEM 0.5 SENSITIVE Sensitive     PIP/TAZO 16 SENSITIVE Sensitive     TRIMETH/SULFA >=320 RESISTANT Resistant     AMPICILLIN/SULBACTAM <=2 SENSITIVE Sensitive     * ACINETOBACTER CALCOACETICUS/BAUMANNII COMPLEX  Urine culture     Status: None   Collection Time: 05/23/15  6:04 PM  Result Value Ref Range Status   Specimen Description URINE, CATHETERIZED  Final   Special Requests NONE  Final   Culture NO GROWTH 1 DAY  Final   Report Status 05/24/2015 FINAL  Final  Wound culture     Status: None   Collection Time: 05/23/15  6:04 PM  Result Value Ref Range Status   Specimen Description WOUND LEFT THIGH  Final   Special Requests NONE  Final   Gram Stain   Final    NO WBC SEEN NO SQUAMOUS EPITHELIAL CELLS SEEN NO ORGANISMS SEEN Performed at [facility]    Culture   Final    ABUNDANT ACINETOBACTER CALCOACETICUS/BAUMANNII COMPLEX Performed at [facility]    Report Status 05/26/2015 FINAL  Final   Organism ID, Bacteria ACINETOBACTER CALCOACETICUS/BAUMANNII COMPLEX  Final      Susceptibility   Acinetobacter calcoaceticus/baumannii complex - MIC*    AMPICILLIN >=32 RESISTANT Resistant     AMPICILLIN/SULBACTAM <=2 SENSITIVE Sensitive     CEFAZOLIN >=64 RESISTANT Resistant     CEFTAZIDIME 4 SENSITIVE Sensitive     CEFTRIAXONE 16 INTERMEDIATE Intermediate     CIPROFLOXACIN >=4 RESISTANT Resistant     GENTAMICIN >=16 RESISTANT Resistant     IMIPENEM 1 SENSITIVE Sensitive     PIP/TAZO 32 INTERMEDIATE Intermediate     TOBRAMYCIN 8 INTERMEDIATE Intermediate      TRIMETH/SULFA Value in next row Resistant      >=320 RESISTANT(NOTE)    * ABUNDANT ACINETOBACTER CALCOACETICUS/BAUMANNII COMPLEX  Culture, blood (routine x 2)     Status: Abnormal (Preliminary result)   Collection Time:  05/27/15 12:01 PM  Result Value Ref Range Status   Specimen Description BLOOD CENTRAL LINE  Final   Special Requests BOTTLES DRAWN AEROBIC AND ANAEROBIC 10CC  Final   Culture  Setup Time   Final    GRAM NEGATIVE COCCOBACILLI IN BOTH AEROBIC AND ANAEROBIC BOTTLES CRITICAL RESULT CALLED TO, READ BACK BY AND VERIFIED WITH: TO BSHEPARD(RN) BY TCLEVELAND 05/28/2015 AT 2:52AM CORRECTED RESULTS CALLED TO: L WILSON,RN AT 2440 05/28/15 BY L BENFIELD PREVIOUSLY REPORTED AS GRAM POSITIVE COCCI    Culture (A)  Final    ACINETOBACTER CALCOACETICUS/BAUMANNII COMPLEX SUSCEPTIBILITIES TO FOLLOW    Report Status PENDING  Incomplete  Culture, blood (routine x 2)     Status: Abnormal (Preliminary result)   Collection Time: 05/27/15 12:07 PM  Result Value Ref Range Status   Specimen Description BLOOD RIGHT HAND  Final   Special Requests BOTTLES DRAWN AEROBIC ONLY 5CC  Final   Culture  Setup Time   Final    GRAM NEGATIVE COCCOBACILLI AEROBIC BOTTLE ONLY CRITICAL RESULT CALLED TO, READ BACK BY AND VERIFIED WITH: TO BSHEPARD (RN) BY TCLEVELAND AT 2:54AM CORRECTED RESULTS CALLED TO: L WILSON,RN AT 1027 05/28/15 BY L BENFIELD PREVIOUSLY REPORTED AS GRAM POSITIVE COCCI    Culture (A)  Final    ACINETOBACTER CALCOACETICUS/BAUMANNII COMPLEX SUSCEPTIBILITIES TO FOLLOW    Report Status PENDING  Incomplete  Culture, blood (Routine X 2) w Reflex to ID Panel     Status: None (Preliminary result)   Collection Time: 05/29/15  9:43 AM  Result Value Ref Range Status   Specimen Description BLOOD LEFT ARM  Final   Special Requests IN PEDIATRIC BOTTLE  4CC  Final   Culture NO GROWTH 1 DAY  Final   Report Status PENDING  Incomplete  Culture, blood (Routine X 2) w Reflex to ID Panel     Status:  None (Preliminary result)   Collection Time: 05/29/15  9:50 AM  Result Value Ref Range Status   Specimen Description BLOOD LEFT ARM  Final   Special Requests IN PEDIATRIC BOTTLE  4CC  Final   Culture NO GROWTH 1 DAY  Final   Report Status PENDING  Incomplete    Cassie L. Roseanne Reno, PharmD PGY2 Infectious Diseases Pharmacy Resident Pager: [phone] 05/30/2015 5:11 PM   Addendum:  I have seen and examined Ms. Sacks and discussed her difficult situation with our infectious disease pharmacy resident, Tennis Must. She has left leg infection complicating her obesity and venous insufficiency. Data from [facility] suggests that a left leg abscess was cultured. I cannot rule out the possibility of undrained abscess given the difficulty of her exam and the inability to do scans of her lower extremities. She's had extensive exposure to antibiotics over the past year. She tells me that she has frequently been treated with 4-5 days of antibiotics when she developed redness of her lower extremities. She now has infection and bacteremia with a progressively more multidrug resistant Acinetobacter. Therapeutic options are quite limited and carry significant risk of either not offering additional benefit as we had more antibiotics as well as the risk of progressive renal insufficiency that is likely to be life ending. After discussing the various options she agrees to it may give her one dose of IV colistin and continue the meropenem tonight. She will need careful reassessment of the situation and options based on her clinical condition, renal function and any additional results from her most recent blood cultures.  Cliffton Asters, MD [facility] [phone] pager   [phone] cell 05/30/2015, 5:36 PM

## 2015-05-31 ENCOUNTER — Inpatient Hospital Stay (HOSPITAL_COMMUNITY): Payer: Medicare Other

## 2015-05-31 DIAGNOSIS — I890 Lymphedema, not elsewhere classified: Secondary | ICD-10-CM

## 2015-05-31 DIAGNOSIS — A419 Sepsis, unspecified organism: Secondary | ICD-10-CM

## 2015-05-31 DIAGNOSIS — Z7189 Other specified counseling: Secondary | ICD-10-CM | POA: Insufficient documentation

## 2015-05-31 DIAGNOSIS — L0291 Cutaneous abscess, unspecified: Secondary | ICD-10-CM | POA: Insufficient documentation

## 2015-05-31 DIAGNOSIS — A488 Other specified bacterial diseases: Secondary | ICD-10-CM

## 2015-05-31 DIAGNOSIS — Z959 Presence of cardiac and vascular implant and graft, unspecified: Secondary | ICD-10-CM

## 2015-05-31 DIAGNOSIS — Z4659 Encounter for fitting and adjustment of other gastrointestinal appliance and device: Secondary | ICD-10-CM | POA: Insufficient documentation

## 2015-05-31 HISTORY — PX: CENTRAL VENOUS CATHETER INSERTION: SHX401

## 2015-05-31 LAB — BASIC METABOLIC PANEL
Anion gap: 13 (ref 5–15)
BUN: 104 mg/dL — AB (ref 6–20)
CALCIUM: 7.8 mg/dL — AB (ref 8.9–10.3)
CHLORIDE: 100 mmol/L — AB (ref 101–111)
CO2: 26 mmol/L (ref 22–32)
CREATININE: 5.12 mg/dL — AB (ref 0.44–1.00)
GFR calc non Af Amer: 9 mL/min — ABNORMAL LOW (ref >60)
GFR, EST AFRICAN AMERICAN: 11 mL/min — AB (ref >60)
GLUCOSE: 118 mg/dL — AB (ref 65–99)
Potassium: 4.1 mmol/L (ref 3.5–5.1)
Sodium: 139 mmol/L (ref 135–145)

## 2015-05-31 LAB — GLUCOSE, CAPILLARY
GLUCOSE-CAPILLARY: 121 mg/dL — AB (ref 65–99)
GLUCOSE-CAPILLARY: 134 mg/dL — AB (ref 65–99)
Glucose-Capillary: 107 mg/dL — ABNORMAL HIGH (ref 65–99)
Glucose-Capillary: 98 mg/dL (ref 65–99)
Glucose-Capillary: 105 mg/dL — ABNORMAL HIGH (ref 65–99)
Glucose-Capillary: 117 mg/dL — ABNORMAL HIGH (ref 65–99)

## 2015-05-31 LAB — CBC
HCT: 30.1 % — ABNORMAL LOW (ref 36.0–46.0)
Hemoglobin: 9.9 g/dL — ABNORMAL LOW (ref 12.0–15.0)
MCH: 30.1 pg (ref 26.0–34.0)
MCHC: 32.9 g/dL (ref 30.0–36.0)
MCV: 91.5 fL (ref 78.0–100.0)
PLATELETS: 130 10*3/uL — AB (ref 150–400)
RBC: 3.29 MIL/uL — ABNORMAL LOW (ref 3.87–5.11)
RDW: 15.8 % — AB (ref 11.5–15.5)
WBC: 32.2 10*3/uL — ABNORMAL HIGH (ref 4.0–10.5)

## 2015-05-31 LAB — CULTURE, BLOOD (ROUTINE X 2)

## 2015-05-31 MED ORDER — DEXTROSE 5 % IV SOLN
1.2500 g | Freq: Three times a day (TID) | INTRAVENOUS | Status: DC
  Administered 2015-05-31 – 2015-06-06 (×19): 1.25 g via INTRAVENOUS
  Filled 2015-05-31 (×31): qty 6

## 2015-05-31 MED ORDER — SODIUM CHLORIDE 0.9 % IV SOLN
INTRAVENOUS | Status: AC
  Administered 2015-05-31: 11:00:00 via INTRAVENOUS

## 2015-05-31 MED ORDER — DEXTROSE 5 % IV SOLN: 2.5000 g | Freq: Three times a day (TID) | INTRAVENOUS | Status: DC

## 2015-05-31 NOTE — Progress Notes (Signed)
eLink Physician-Brief Progress Note Patient Name: Michele Weber DOB: [birth year] MRN: [id number]   Date of Service  05/31/2015  HPI/Events of Note  RN calls re: N L IJ TLC  eICU Interventions  CXR reviewed. May use L IJ TLC. Nurse aware.      Intervention Category Evaluation Type: Other  [name] 05/31/2015, 5:20 PM

## 2015-05-31 NOTE — Procedures (Signed)
Central Venous Catheter Insertion Procedure Note Michele Weber [id number] Oct 12, 1970  Procedure: Insertion of Central Venous Catheter Indications: Assessment of intravascular volume, Drug and/or fluid administration and Frequent blood sampling  Procedure Details Consent: Risks of procedure as well as the alternatives and risks of each were explained to the (patient/caregiver).  Consent for procedure obtained. Time Out: Verified patient identification, verified procedure, site/side was marked, verified correct patient position, special equipment/implants available, medications/allergies/relevent history reviewed, required imaging and test results available.  Performed  Maximum sterile technique was used including antiseptics, cap, gloves, gown, hand hygiene, mask and sheet. Skin prep: Chlorhexidine; local anesthetic administered A triple lumen catheter was placed in the left internal jugular vein to 20 cm using the Seldinger technique.  Sutured x2.    Evaluation Blood flow good Complications: No apparent complications Patient did tolerate procedure well. Chest X-ray ordered to verify placement.  CXR: pending.   Procedure performed under direct supervision of Dr. Craige CottaSood and with ultrasound guidance for real time vessel cannulation.       Michele BrimBrandi Kiet Geer, NP-C [facility] Pgr: [phone] or if no answer [phone] 05/31/2015, 3:07 PM

## 2015-05-31 NOTE — Progress Notes (Signed)
05/31/2015 Patient right IJ was removed at 1900,  pressure was apply to the area.Rn place a pressure dressing the site was assess and it clean and dry. [facility] Center[facility]adine Caren Garske RN.

## 2015-05-31 NOTE — Progress Notes (Signed)
Subjective: No new complaints   Antibiotics:  Anti-infectives    Start     Dose/Rate Route Frequency Ordered Stop   05/31/15 1000  ceftazidime-avibactam (AVYCAZ) 1.25 g in dextrose 5 % 50 mL IVPB     1.25 g 25 mL/hr over 2 Hours Intravenous Every 8 hours 05/31/15 0914     05/31/15 0900  ceftazidime-avibactam (AVYCAZ) 2.5 g in dextrose 5 % 50 mL IVPB  Status:  Discontinued     2.5 g 25 mL/hr over 2 Hours Intravenous 3 times per day 05/31/15 0851 05/31/15 0857   05/30/15 1800  colistimethate (COLYMYCIN) 300 mg in sodium chloride 0.9 % 100 mL IVPB     300 mg 208 mL/hr over 30 Minutes Intravenous  Once 05/30/15 1716 05/30/15 1847   05/30/15 0405  vancomycin (VANCOCIN) 2,000 mg in sodium chloride 0.9 % 500 mL IVPB  Status:  Discontinued     2,000 mg 250 mL/hr over 120 Minutes Intravenous Every 48 hours 05/28/15 0842 05/28/15 1334   05/28/15 1700  meropenem (MERREM) 2 g in sodium chloride 0.9 % 100 mL IVPB  Status:  Discontinued     2 g 200 mL/hr over 30 Minutes Intravenous Every 12 hours 05/28/15 0841 05/31/15 0851   05/28/15 0400  vancomycin (VANCOCIN) 1,750 mg in sodium chloride 0.9 % 500 mL IVPB  Status:  Discontinued     1,750 mg 250 mL/hr over 120 Minutes Intravenous Every 24 hours 05/28/15 0330 05/28/15 0842   05/25/15 1700  meropenem (MERREM) 1 g in sodium chloride 0.9 % 100 mL IVPB  Status:  Discontinued     1 g 200 mL/hr over 30 Minutes Intravenous Every 12 hours 05/25/15 1255 05/28/15 0841   05/24/15 0945  meropenem (MERREM) 1 g in sodium chloride 0.9 % 100 mL IVPB  Status:  Discontinued     1 g 200 mL/hr over 30 Minutes Intravenous 3 times per day 05/24/15 0939 05/25/15 1255   05/23/15 2200  piperacillin-tazobactam (ZOSYN) IVPB 3.375 g  Status:  Discontinued     3.375 g 12.5 mL/hr over 240 Minutes Intravenous 3 times per day 05/23/15 1257 05/24/15 0939   05/23/15 1300  vancomycin (VANCOCIN) 2,500 mg in sodium chloride 0.9 % 500 mL IVPB     2,500 mg 250 mL/hr  over 120 Minutes Intravenous  Once 05/23/15 1257 05/23/15 1656   05/23/15 1300  piperacillin-tazobactam (ZOSYN) IVPB 3.375 g     3.375 g 100 mL/hr over 30 Minutes Intravenous  Once 05/23/15 1257 05/23/15 1417      Medications: Scheduled Meds: . amiodarone  400 mg Oral BID  . antiseptic oral rinse  7 mL Mouth Rinse q12n4p  . ceftazidime avibactam (AVYCAZ) IVPB  1.25 g Intravenous Q8H  . chlorhexidine  15 mL Mouth Rinse BID  . feeding supplement (PRO-STAT SUGAR FREE 64)  30 mL Per Tube TID  . insulin aspart  0-9 Units Subcutaneous 6 times per day  . levothyroxine  25 mcg Intravenous Daily  . pantoprazole (PROTONIX) IV  40 mg Intravenous Q24H  . silver sulfADIAZINE   Topical Daily   Continuous Infusions: . sodium chloride    . norepinephrine (LEVOPHED) Adult infusion    . vasopressin (PITRESSIN) infusion - *FOR SHOCK* Stopped (05/28/15 0418)   PRN Meds:.sodium chloride, fentaNYL (SUBLIMAZE) injection    Objective: Weight change: -7 lb (-3.175 kg)  Intake/Output Summary (Last 24 hours) at 05/31/15 1006 Last data filed at 05/31/15 0430  Gross per 24  hour  Intake 1302.5 ml  Output   1100 ml  Net  202.5 ml   Blood pressure 95/65, pulse 122, temperature 98.2 F (36.8 C), temperature source Axillary, resp. rate 27, height  (1.676 m), weight 663 lb (300.735 kg), last menstrual period 05/28/2015, SpO2 100 %. Temp:  [97.6 F (36.4 C)-98.5 F (36.9 C)] 98.2 F (36.8 C) (04/13 0746) Pulse Rate:  [118-140] 122 (04/13 0746) Resp:  [16-27] 27 (04/13 0746) BP: (81-113)/(42-90) 95/65 mmHg (04/13 0746) SpO2:  [97 %-100 %] 100 % (04/13 0746) Weight:  [663 lb (300.735 kg)] 663 lb (300.735 kg) (04/13 0500)  Physical Exam: General: Alert and awake, oriented x3,  HEENT: anicteric sclera EOMI, central line in place CVS tachy rate, normal r,  no murmur rubs or gallops Chest: clear to auscultation bilaterally, no wheezing, rales or rhonchi Abdomen: soft nontender, nondistended, normal  bowel sounds, Extremities: lymphedema with ulceration, tenderness redness   Neuro: nonfocal  CBC: CBC Latest Ref Rng 05/31/2015 05/29/2015 05/28/2015  WBC 4.0 - 10.5 K/uL 32.2(H) 39.3(H) 39.1(H)  Hemoglobin 12.0 - 15.0 g/dL 1.6(X) 10.2(L) 9.7(L)  Hematocrit 36.0 - 46.0 % 30.1(L) 30.4(L) 29.6(L)  Platelets 150 - 400 K/uL 130(L) 66(L) 36(L)       BMET  Recent Labs  05/29/15 0452 05/31/15 0530  NA 139 139  K 4.6 4.1  CL 100* 100*  CO2 25 26  GLUCOSE 149* 118*  BUN 83* 104*  CREATININE 5.02* 5.12*  CALCIUM 7.3* 7.8*     Liver Panel   Recent Labs  05/29/15 0452  PROT 5.6*  ALBUMIN 1.6*  AST 18  ALT 33  ALKPHOS 86  BILITOT 0.7       Sedimentation Rate No results for input(s): ESRSEDRATE in the last 72 hours. C-Reactive Protein No results for input(s): CRP in the last 72 hours.  Micro Results: Recent Results (from the past 720 hour(s))  MRSA PCR Screening     Status: None   Collection Time: 05/23/15  1:42 PM  Result Value Ref Range Status   MRSA by PCR NEGATIVE NEGATIVE Final    Comment:        The GeneXpert MRSA Assay (FDA approved for NASAL specimens only), is one component of a comprehensive MRSA colonization surveillance program. It is not intended to diagnose MRSA infection nor to guide or monitor treatment for MRSA infections.   Culture, blood (routine x 2)     Status: Abnormal   Collection Time: 05/23/15  2:53 PM  Result Value Ref Range Status   Specimen Description BLOOD RIGHT ARM  Final   Special Requests IN PEDIATRIC BOTTLE  3CC  Final   Culture  Setup Time (A)  Final    GRAM NEGATIVE DIPLOCOCCI AEROBIC BOTTLE ONLY CRITICAL RESULT CALLED TO, READ BACK BY AND VERIFIED WITH: C WOFFORD,RN  05/24/15 MKELLY CORRECTED RESULTS GRAM NEGATIVE RODS CORRECTED RESULTS CALLED TO: J. SMITH,RN AT 0960 ON [id number] BY Lucienne Capers    Culture (A)  Final    ACINETOBACTER CALCOACETICUS/BAUMANNII COMPLEX SUSCEPTIBILITIES PERFORMED ON PREVIOUS  CULTURE WITHIN THE LAST 5 DAYS.    Report Status 05/26/2015 FINAL  Final  Culture, blood (routine x 2)     Status: Abnormal   Collection Time: 05/23/15  3:05 PM  Result Value Ref Range Status   Specimen Description BLOOD LEFT ARM  Final   Special Requests IN PEDIATRIC BOTTLE 3CC  Final   Culture  Setup Time (A)  Final    GRAM NEGATIVE DIPLOCOCCI AEROBIC BOTTLE ONLY  CRITICAL RESULT CALLED TO, READ BACK BY AND VERIFIED WITH: C WOFFORD,RN @0415  05/24/15 MKELLY CORRECTED RESULTS GRAM NEGATIVE RODS CORRECTED RESULTS CALLED TO: J. SMITH,RN AT [id number] ON [id number] BY Lucienne CapersS. YARBROUGH    Culture ACINETOBACTER CALCOACETICUS/BAUMANNII COMPLEX (A)  Final   Report Status 05/26/2015 FINAL  Final   Organism ID, Bacteria ACINETOBACTER CALCOACETICUS/BAUMANNII COMPLEX  Final      Susceptibility   Acinetobacter calcoaceticus/baumannii complex - MIC*    CEFTAZIDIME 8 SENSITIVE Sensitive     CEFTRIAXONE 16 INTERMEDIATE Intermediate     CIPROFLOXACIN >=4 RESISTANT Resistant     GENTAMICIN >=16 RESISTANT Resistant     IMIPENEM 0.5 SENSITIVE Sensitive     PIP/TAZO 16 SENSITIVE Sensitive     TRIMETH/SULFA >=320 RESISTANT Resistant     AMPICILLIN/SULBACTAM <=2 SENSITIVE Sensitive     * ACINETOBACTER CALCOACETICUS/BAUMANNII COMPLEX  Urine culture     Status: None   Collection Time: 05/23/15  6:04 PM  Result Value Ref Range Status   Specimen Description URINE, CATHETERIZED  Final   Special Requests NONE  Final   Culture NO GROWTH 1 DAY  Final   Report Status 05/24/2015 FINAL  Final  Wound culture     Status: None   Collection Time: 05/23/15  6:04 PM  Result Value Ref Range Status   Specimen Description WOUND LEFT THIGH  Final   Special Requests NONE  Final   Gram Stain   Final    NO WBC SEEN NO SQUAMOUS EPITHELIAL CELLS SEEN NO ORGANISMS SEEN Performed at [facility]    Culture   Final    ABUNDANT ACINETOBACTER CALCOACETICUS/BAUMANNII COMPLEX Performed at [facility]    Report Status  05/26/2015 FINAL  Final   Organism ID, Bacteria ACINETOBACTER CALCOACETICUS/BAUMANNII COMPLEX  Final      Susceptibility   Acinetobacter calcoaceticus/baumannii complex - MIC*    AMPICILLIN >=32 RESISTANT Resistant     AMPICILLIN/SULBACTAM <=2 SENSITIVE Sensitive     CEFAZOLIN >=64 RESISTANT Resistant     CEFTAZIDIME 4 SENSITIVE Sensitive     CEFTRIAXONE 16 INTERMEDIATE Intermediate     CIPROFLOXACIN >=4 RESISTANT Resistant     GENTAMICIN >=16 RESISTANT Resistant     IMIPENEM 1 SENSITIVE Sensitive     PIP/TAZO 32 INTERMEDIATE Intermediate     TOBRAMYCIN 8 INTERMEDIATE Intermediate     TRIMETH/SULFA Value in next row Resistant      >=320 RESISTANT(NOTE)    * ABUNDANT ACINETOBACTER CALCOACETICUS/BAUMANNII COMPLEX  Culture, blood (routine x 2)     Status: Abnormal (Preliminary result)   Collection Time: 05/27/15 12:01 PM  Result Value Ref Range Status   Specimen Description BLOOD CENTRAL LINE  Final   Special Requests BOTTLES DRAWN AEROBIC AND ANAEROBIC 10CC  Final   Culture  Setup Time   Final    GRAM NEGATIVE COCCOBACILLI IN BOTH AEROBIC AND ANAEROBIC BOTTLES CRITICAL RESULT CALLED TO, READ BACK BY AND VERIFIED WITH: TO BSHEPARD(RN) BY TCLEVELAND 05/28/2015 AT 2:52AM CORRECTED RESULTS CALLED TO: L WILSON,RN AT [id number] 05/28/15 BY L BENFIELD PREVIOUSLY REPORTED AS GRAM POSITIVE COCCI    Culture (A)  Final    ACINETOBACTER CALCOACETICUS/BAUMANNII COMPLEX MULTI-DRUG RESISTANT ORGANISM SENDING TO REFERENCE LAB FOR COLISITIN AND AMIKACIN SUSCEPTIBILITIES    Report Status PENDING  Incomplete   Organism ID, Bacteria ACINETOBACTER CALCOACETICUS/BAUMANNII COMPLEX  Final      Susceptibility   Acinetobacter calcoaceticus/baumannii complex - MIC*    CEFTAZIDIME 16 INTERMEDIATE Intermediate     CEFTRIAXONE >=64 RESISTANT Resistant  CIPROFLOXACIN >=4 RESISTANT Resistant     GENTAMICIN >=16 RESISTANT Resistant     IMIPENEM >=16 RESISTANT Resistant     PIP/TAZO >=128 RESISTANT Resistant      TRIMETH/SULFA >=320 RESISTANT Resistant     AMPICILLIN/SULBACTAM 16 INTERMEDIATE Intermediate     * ACINETOBACTER CALCOACETICUS/BAUMANNII COMPLEX  Culture, blood (routine x 2)     Status: Abnormal   Collection Time: 05/27/15 12:07 PM  Result Value Ref Range Status   Specimen Description BLOOD RIGHT HAND  Final   Special Requests BOTTLES DRAWN AEROBIC ONLY 5CC  Final   Culture  Setup Time   Final    GRAM NEGATIVE COCCOBACILLI AEROBIC BOTTLE ONLY CRITICAL RESULT CALLED TO, READ BACK BY AND VERIFIED WITH: TO BSHEPARD (RN) BY TCLEVELAND AT 2:54AM CORRECTED RESULTS CALLED TO: L WILSON,RN AT 1884 05/28/15 BY L BENFIELD PREVIOUSLY REPORTED AS GRAM POSITIVE COCCI    Culture (A)  Final    ACINETOBACTER CALCOACETICUS/BAUMANNII COMPLEX SUSCEPTIBILITIES PERFORMED ON PREVIOUS CULTURE WITHIN THE LAST 5 DAYS.    Report Status 05/31/2015 FINAL  Final  Culture, blood (Routine X 2) w Reflex to ID Panel     Status: None (Preliminary result)   Collection Time: 05/29/15  9:43 AM  Result Value Ref Range Status   Specimen Description BLOOD LEFT ARM  Final   Special Requests IN PEDIATRIC BOTTLE  4CC  Final   Culture NO GROWTH 1 DAY  Final   Report Status PENDING  Incomplete  Culture, blood (Routine X 2) w Reflex to ID Panel     Status: None (Preliminary result)   Collection Time: 05/29/15  9:50 AM  Result Value Ref Range Status   Specimen Description BLOOD LEFT ARM  Final   Special Requests IN PEDIATRIC BOTTLE  4CC  Final   Culture NO GROWTH 1 DAY  Final   Report Status PENDING  Incomplete    Studies/Results: No results found.    Assessment/Plan:  INTERVAL HISTORY:   06/01/15: cultures persistently + for acinetobacter which is R to Merrem as well   Principal Problem:   MDR Acinetobacter baumannii infection Active Problems:   Morbid obesity (HCC)   Lymphedema of both lower extremities   Cellulitis and abscess of trunk   Sepsis (HCC)   Septic shock (HCC)   Acute respiratory failure (HCC)    Palliative care encounter   Goals of care, counseling/discussion   Acute renal failure with tubular necrosis (HCC)    Michele Weber is a 45 y.o. female with  MDR acinetobacter bacteremia. She still has central line from 05/22/15  #1 MDR Acinetobacter  --ask CCM to DC her central line and replace with a new one --start AVYCAZ, DC meropenem --try to acquire AVYCAZ S testing at [facility], followup colistin,amikcain,  tygacil, polymyxin B --repeat blood cultures tomorrow after central line out and she has had a day of avycaz ---may be forced to use more colisting which will put kidneys at risk --difficult to find drainable source given morbid obesity and inability to image    LOS: 8 days   Acey Lav 05/31/2015, 10:06 AM

## 2015-05-31 NOTE — NC FL2 (Signed)
MEDICAID FL2 LEVEL OF CARE SCREENING TOOL     IDENTIFICATION  Patient Name: Michele MowersSharon Theurer Birthdate: 1970/05/06 Sex: female Admission Date (Current Location): 05/23/2015  [facility] [place]   Facility and Address:  [facility] [place], [address], [place], [place] [address]      Provider Number:    Attending Physician Name and Address:  Jerald KiefStephen K Chiu, MD  Relative Name and Phone Number:   Dondra Prader(dominique (daughter) [phone])    Current Level of Care: Home Recommended Level of Care: Skilled Nursing Facility Prior Approval Number:    Date Approved/Denied:   PASRR Number:    Discharge Plan: SNF    Current Diagnoses: Patient Active Problem List   Diagnosis Date Noted  . Abscess   . 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 Goals of care, counseling/discussion   . Acute renal failure with tubular necrosis (HCC)   . Palliative care encounter   . Acute respiratory failure (HCC)   . Septic shock (HCC)   . Sepsis (HCC) 05/23/2015  . Unspecified hypothyroidism 10/04/2013  . Hypotension 09/30/2013  . Edema 09/14/2013  . Atrial fibrillation with RVR (HCC) 08/11/2013  . Cellulitis and abscess of trunk 07/21/2013  . Abnormal weight gain 07/08/2013  . Anemia, unspecified 07/08/2013  . Encounter for long-term (current) use of other medications 07/04/2013  . Amenorrhea 06/07/2013  . Open wound of knee, leg (except thigh), and ankle, complicated 06/03/2013  . Hypokalemia 05/12/2013  . Unspecified protein-calorie malnutrition (HCC) 05/12/2013  . Physical deconditioning 05/12/2013  . Morbid obesity (HCC)   . Lymphedema of both lower extremities   . Diastolic CHF (HCC)   . Vitamin D deficiency     Orientation RESPIRATION BLADDER Height & Weight     Self, Time, Situation, Place  Normal Continent Weight: (!) 663 lb (300.735 kg) Height:  5\' 6"  (167.6 cm)  BEHAVIORAL SYMPTOMS/MOOD NEUROLOGICAL BOWEL NUTRITION  STATUS      Continent    AMBULATORY STATUS COMMUNICATION OF NEEDS Skin   Limited Assist Verbally Normal                       Personal Care Assistance Level of Assistance  Bathing, Feeding, Dressing Bathing Assistance: Limited assistance Feeding assistance: Limited assistance Dressing Assistance: Limited assistance     Functional Limitations Info  Sight, Hearing, Speech Sight Info: Adequate Hearing Info: Adequate Speech Info: Adequate    SPECIAL CARE FACTORS FREQUENCY  PT (By licensed PT), OT (By licensed OT)     PT Frequency: no follow up OT Frequency: no follow up            Contractures      Additional Factors Info  Code Status, Allergies Code Status Info: partial Allergies Info: NKA           Current Medications (05/31/2015):  This is the current hospital active medication list Current Facility-Administered Medications  Medication Dose Route Frequency Provider Last Rate Last Dose  . 0.9 %  sodium chloride infusion  250 mL Intravenous PRN Rahul P Desai, PA-C 10 mL/hr at 05/29/15 2300 250 mL at 05/29/15 2300  . 0.9 %  sodium chloride infusion   Intravenous Continuous Jerald KiefStephen K Chiu, MD 75 mL/hr at 05/31/15 1033    . amiodarone (PACERONE) tablet 400 mg  400 mg Oral BID Leslye Peerobert S Byrum, MD   400 mg at 05/31/15 1023  . antiseptic oral rinse (CPC / CETYLPYRIDINIUM CHLORIDE 0.05%) solution 7  mL  7 mL Mouth Rinse q12n4p Jose Alexis Frock, MD   7 mL at 05/30/15 1731  . ceftazidime-avibactam (AVYCAZ) 1.25 g in dextrose 5 % 50 mL IVPB  1.25 g Intravenous Q8H Randall Hiss, MD   1.25 g at 05/31/15 1053  . chlorhexidine (PERIDEX) 0.12 % solution 15 mL  15 mL Mouth Rinse BID Jose Alexis Frock, MD   15 mL at 05/31/15 1023  . feeding supplement (PRO-STAT SUGAR FREE 64) liquid 30 mL  30 mL Per Tube TID Leslye Peer, MD   30 mL at 05/31/15 1023  . fentaNYL (SUBLIMAZE) injection 25-50 mcg  25-50 mcg Intravenous Q2H PRN Irean Hong, NP   50 mcg at  05/27/15 0916  . insulin aspart (novoLOG) injection 0-9 Units  0-9 Units Subcutaneous 6 times per day Zigmund Gottron, MD   1 Units at 05/31/15 0009  . levothyroxine (SYNTHROID, LEVOTHROID) injection 25 mcg  25 mcg Intravenous Daily Jose Alexis Frock, MD   25 mcg at 05/31/15 1022  . norepinephrine (LEVOPHED) 4 mg in dextrose 5 % 250 mL (0.016 mg/mL) infusion  2-50 mcg/min Intravenous Continuous Leslye Peer, MD      . pantoprazole (PROTONIX) injection 40 mg  40 mg Intravenous Q24H Ejiroghene E Mariea Clonts, MD   40 mg at 05/30/15 1048  . silver sulfADIAZINE (SILVADENE) 1 % cream   Topical Daily Cyril Mourning V, MD      . vasopressin (PITRESSIN) 40 Units in sodium chloride 0.9 % 250 mL (0.16 Units/mL) infusion  0.03 Units/min Intravenous Continuous Oretha Milch, MD   Stopped at 05/28/15 [phone]     Discharge Medications: Please see discharge summary for a list of discharge medications.  Relevant Imaging Results:  Relevant Lab Results:   Additional Information SS# [id number]  Catheryn Bacon  BSW intern  [phone]

## 2015-05-31 NOTE — Clinical Social Work Note (Signed)
Clinical Social Work Assessment  Patient Details  Name: Michele MowersSharon Weber MRN: [id number] Date of Birth: 1970/03/11  Date of referral:  05/31/15               Reason for consult:  Facility Placement                Permission sought to share information with:  Case Manager, Facility Medical sales representativeContact Representative, Family Supports Permission granted to share information::  Yes, Release of Information Signed  Name::      Michele Weber([name])  Agency::     Relationship::   (daughter)  SolicitorContact Information:   [phone])  Housing/Transportation Living arrangements for the past 2 months:  Skilled Nursing Facility ( health and rehab) Source of Information:  Adult Children Patient Interpreter Needed:  None Criminal Activity/Legal Involvement Pertinent to Current Situation/Hospitalization:  No - Comment as needed Significant Relationships:  Adult Children, Siblings Lives with:  Facility Resident Do you feel safe going back to the place where you live?  Yes Need for family participation in patient care:  Yes (Comment)  Care giving concerns:  Patient wants a facility close to family. Family wants a facility closer to patient.    Social Worker assessment / plan: BSW intern enters patient room. Patient is alert but does not fell up to it so BSW intern completed assessment with daughter and sister that was in the room at the time. BSW intern wanted to know if patient wanted to [facility]. Patient family does not want patient to return [facility], they want to look at facilities close to the [place] area. The daughter said she currently stays in [place] and feels that mom would benefit more by being closer to family that can come by and help and support mom in her time of need. BSW intern will follow up with facilities in [place] and [place] area.  Employment status:  Disabled (Comment on whether or not currently receiving Disability) Insurance  information:  Medicare PT Recommendations:  Skilled Nursing Facility Information / Referral to community resources:  Skilled Nursing Facility  Patient/Family's Response to care:  Patient family has good response to care.  Patient/Family's Understanding of and Emotional Response to Diagnosis, Current Treatment, and Prognosis:  Patient family has a good insight on patient condition and current treatments.   Emotional Assessment Appearance:  Appears stated age Attitude/Demeanor/Rapport:  Unable to Assess Affect (typically observed):  Unable to Assess Orientation:  Oriented to Self, Oriented to Place, Oriented to  Time, Oriented to Situation Alcohol / Substance use:  Never Used Psych involvement (Current and /or in the community):  No (Comment)  Discharge Needs  Concerns to be addressed:  No discharge needs identified Readmission within the last 30 days:  No Current discharge risk:  None Barriers to Discharge:  No Barriers Identified   Barb MerinoCharlean McNeill, Student-SW 05/31/2015, 12:00 PM   CSW reviewed and agrees with findings  Merlyn LotJenna Holoman, LCSWA Clinical Social Worker [phone]

## 2015-05-31 NOTE — Progress Notes (Signed)
Pharmacy Antibiotic Note  Michele MowersSharon Weber is a 45 y.o. female admitted on 05/23/2015 with sepsis due to MDR acinetobacter bacteremia.  Pharmacy has been consulted for ceftazidime-avibactam dosing.  Pt with MDR Acinetobacter bacteremia that is resistant to every drug we usually test - even carbapenems, and she has been on meropenem for about a week.  I have asked the micro lab to test amikacin and colistin.  Our lab cannot test for ceftazidime-avibactam, but we will put her on this medication for now.  May possibly be using colistin in the near future as well, but want to try and preserve her kidney function.   Plan: - Stop meropenem - Start ceftazidime-avibactam 1.25 gm IV q8h - no data on dosing in severely obese patients - F/u additional susceptibilities - Monitor patient progress  Height: 5\' 6"  (167.6 cm) Weight: (!) 663 lb (300.735 kg) IBW/kg (Calculated) : 59.3  Temp (24hrs), Avg:97.9 F (36.6 C), Min:97.6 F (36.4 C), Max:98.5 F (36.9 C)   Recent Labs Lab 05/24/15 1430 05/25/15 0521 05/26/15 0415 05/28/15 0410 05/29/15 0452 05/31/15 0530  WBC  --  14.7* 28.9* 39.1* 39.3* 32.2*  CREATININE  --  3.84* 4.18* 5.02* 5.02* 5.12*  VANCORANDOM 34 30  --   --   --   --     Estimated Creatinine Clearance: 34.1 mL/min (by C-G formula based on Cr of 5.12).    No Known Allergies  Antimicrobials this admission: Levaquin PTA Vanc 4/4 >> 4/7 Zosyn 4/4 >> 4/6 Meropenem 4/6 >> 4/14 Colistin x 1 4/12 Ceftazidime-avibactam 4/13 >>  Dose adjustments this admission: 4/6 VR = 34  Microbiology results: Blood [facility]via([place]) - Acinetobacter (sensitive Zosyn, Merrem)  Abscess cx ([place]) - negative  4/5 BCx x2 - Acinetobacter (sensitive Unasyn, Primaxin, Zosyn)  4/5 UCx - negative  4/5 left thigh wound - Acinetobacter (sensitive Unasyn, Fortaz, Primaxin) 4/9 BCx: Acinetobacter - R to ceftriaxone, cipro, gent, imipenem, zosyn, bactrim; I to ceftazidime and unasyn  4/11 BCx:  NGTD  Thank you for allowing pharmacy to be a part of this patient's care.  Cassie L. Roseanne RenoStewart, PharmD PGY2 Infectious Diseases Pharmacy Resident Pager: [phone] 05/31/2015 11:07 AM

## 2015-05-31 NOTE — Progress Notes (Signed)
Speech Language Pathology Treatment: Dysphagia  Patient Details Name: Michele Weber MRN: [id number] DOB: 18-Jun-1970 Today's Date: 05/31/2015 Time: 0459-9774 SLP Time Calculation (min) (ACUTE ONLY): 21 min  Assessment / Plan / Recommendation Clinical Impression  Pt seen for SLP followup of diet tolerance and requirement for supervision with PO intake. Pt was able to self-feed after setup of Dysphagia 3 tray with thin liquids. Able to raise bed to upright position once provided with remote. No s/s aspiration noted. No throat clearing. Vocal quality remained clear throughout meal. Discussed current diet texture with pt who agreed that mechanical soft is the best consistency for her given mobility limitations and quick physical fatigue. Recommend: Continue Dysphagia 3 with thins. No further SLP intervention planned at this time. Pt and daughter agree with POC.   HPI HPI: Morbidly obese 850 lbs presented to [facility] 4/4 withAMS, hypotension , lactic acidosis, elevated pro calcitonin, BC with GNR, draining pannus wounds.      SLP Plan  All goals met;Discharge SLP treatment due to (comment) (no further tx warranted)     Recommendations  Diet recommendations: Dysphagia 3 (mechanical soft);Thin liquid Liquids provided via: Straw;Cup Medication Administration: Whole meds with liquid (or puree- per pt preference) Supervision: Patient able to self feed (staff to assist with setup) Compensations: Small sips/bites;Slow rate Postural Changes and/or Swallow Maneuvers: Seated upright 90 degrees;Upright 30-60 min after meal             Oral Care Recommendations: Oral care QID Follow up Recommendations: 24 hour supervision/assistance Plan: All goals met;Discharge SLP treatment due to (comment) (no further tx warranted)     Poyen MA, [place] Pager [phone] 05/31/2015, 1:44 PM

## 2015-05-31 NOTE — Research (Signed)
Protocol Title: The MIND-USA Study Modifying the Impact of ICU-Associated Neurological Dysfunction: A multicenter, doubleblind, randomized, placebo-controlled trial investigating the effects of intravenously (IV) administered typical and atypical antipsychotics (haloperidol and ziprasidone) on delirium in critically ill patients.  Informed Consent   Subject Name: Michele MowersSharon Ruane  This patient, Michele Weber has been re-consented to the above clinical trial according to FDA regulations, GCP guidelines and [facility]'s SOPs. Jasmine DecemberSharon Blatter's condition has now improved and her mental status and emotional capacity are adequate to re-consent to this study. The informed consent form and study design have been explained to this patient by this RN. The patient demonstrated comprehension of the trial and study requirements/expectations. This patient was given sufficient time for reading the informed consent form. All risks, benefits and options have been thoroughly discussed and all questions were answered per the patient's satisfaction. This patient was not coerced in any way to participate in this clinical trial. This patient and this RN have voluntarily signed ICF version 5.2 at 0915 on June 01, 2015. A copy of the signed consent form was given to the patient and a copy was placed in the patient's medical record.  While enrolled in the study and in the ICU, no open label antipsychotic medications can be administered to the patient.   Abbey ChattersFrances J. Lucretia KernAberion, RN Clinical Research Nurse Coordinator [facility] [phone]    Agree with re consent. Confusion resolved and wa sable to make her own decisions  Mcarthur RossettiDaniel J. Tyson AliasFeinstein, MD, FACP Pgr: [phone] [facility]

## 2015-05-31 NOTE — Progress Notes (Addendum)
Daily Progress Note   Patient Name: Michele Weber       Date: 05/31/2015 DOB: 03/05/1970  Age: 45 y.o. MRN#: [id number] Attending Physician: Jerald Kief, MD Primary Care Physician: Thad Ranger, MD Admit Date: 05/23/2015  Reason for Consultation/Follow-up: Establishing goals of care  Subjective: Patient asks about her kidney function.  She asks me to explain how it is assessed and what the numbers mean.  Patient reports she has 2 masters degrees that she has obtained via on line education.  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    Interval Events: Improved mental status.  Creatinine slightly worse, but urine output slightly improved.  ID has utilized one dose of colistin and ID has changed her antibiotics from meropenem to avycaz based on resistance patterns of the MDR acinetobacter. She continues to struggle with multiple wounds, and bullae on her extremities.  A large bulla has ruptured on her leg leaving a large area of exposed tissue.  Patient is now on a D3 diet and all pressors have been discontinued.  BP low but stable.  We discussed her current health status with focus on the bacteremia, her wounds, and her kidneys.  We discussed hospice eligibility.  I asked the patient and her if it was surprising to them that she is likely hospice eligible.  The patient was not surprised. However, she states that at this point in her life she wants to push forward and get better so that she will be able to walk.  Length of Stay: 8 days  Current Medications: Scheduled Meds:  . amiodarone  400 mg Oral BID  . antiseptic oral rinse  7 mL Mouth Rinse q12n4p  . ceftazidime avibactam (AVYCAZ) IVPB  1.25 g Intravenous Q8H  . chlorhexidine  15 mL  Mouth Rinse BID  . feeding supplement (PRO-STAT SUGAR FREE 64)  30 mL Per Tube TID  . insulin aspart  0-9 Units Subcutaneous 6 times per day  . levothyroxine  25 mcg Intravenous Daily  . pantoprazole (PROTONIX) IV  40 mg Intravenous Q24H  . silver sulfADIAZINE   Topical Daily    Continuous Infusions: . sodium chloride 75 mL/hr at 05/31/15 1033  . norepinephrine (LEVOPHED) Adult infusion    . vasopressin (PITRESSIN) infusion - *FOR SHOCK* Stopped (  05/28/15 0418)    PRN Meds: sodium chloride, fentaNYL (SUBLIMAZE) injection   Physical Exam             Massively obese, Awake, Alert, orientated x 3.  Sister and daughter at bedside, n/g removed. CV difficult to hear Resp:  No increased work of breathing. Abdomen:  Soft.  Non distended Ext:  Able to move all 4   Vital Signs: BP 104/49 mmHg  Pulse 112  Temp(Src) 97.6 F (36.4 C) (Axillary)  Resp 25  Ht 5\' 6"  (1.676 m)  Wt 300.735 kg (663 lb)  BMI 107.06 kg/m2  SpO2 93%  LMP 05/28/2015 SpO2: SpO2: 93 % O2 Device: O2 Device: Not Delivered O2 Flow Rate: O2 Flow Rate (L/min): 2 L/min  Intake/output summary:  Intake/Output Summary (Last 24 hours) at 05/31/15 1540 Last data filed at 05/31/15 0430  Gross per 24 hour  Intake  867.5 ml  Output    650 ml  Net  217.5 ml   LBM: Last BM Date: 05/30/15 Baseline Weight: Weight: (!) 363 kg (800 lb 4.3 oz) Most recent weight: Weight: (!) 300.735 kg (663 lb)       Palliative Assessment/Data: Flowsheet Rows        Most Recent Value   Intake Tab    Referral Department  Hospitalist   Unit at Time of Referral  ICU   Palliative Care Primary Diagnosis  Sepsis/Infectious Disease   Date Notified  05/24/15   Palliative Care Type  New Palliative care   Reason for referral  Clarify Goals of Care   Date of Admission  05/19/15   Date first seen by Palliative Care  05/25/15   # of days Palliative referral response time  1 Day(s)   # of days IP prior to Palliative referral  5   Clinical  Assessment    Palliative Performance Scale Score  20%   Pain Max last 24 hours  Not able to report   Pain Min Last 24 hours  Not able to report   Dyspnea Max Last 24 Hours  Not able to report   Dyspnea Min Last 24 hours  Not able to report   Nausea Max Last 24 Hours  Not able to report   Nausea Min Last 24 Hours  Not able to report   Anxiety Max Last 24 Hours  Not able to report   Anxiety Min Last 24 Hours  Not able to report   Other Max Last 24 Hours  Not able to report   Psychosocial & Spiritual Assessment    Palliative Care Outcomes    Patient/Family meeting held?  Yes   Who was at the meeting?  daughter, pt's sister   Palliative Care follow-up planned  Yes, Facility      Additional Data Reviewed: CBC    Component Value Date/Time   WBC 32.2* 05/31/2015 0530   WBC 7.2 05/13/2013   RBC 3.29* 05/31/2015 0530   HGB 9.9* 05/31/2015 0530   HCT 30.1* 05/31/2015 0530   PLT 130* 05/31/2015 0530   MCV 91.5 05/31/2015 0530   MCH 30.1 05/31/2015 0530   MCHC 32.9 05/31/2015 0530   RDW 15.8* 05/31/2015 0530   LYMPHSABS 0.5* 05/23/2015 1348   MONOABS 0.7 05/23/2015 1348   EOSABS 0.0 05/23/2015 1348   BASOSABS 0.0 05/23/2015 1348    CMP     Component Value Date/Time   NA 139 05/31/2015 0530   NA 136* 05/13/2013   K 4.1 05/31/2015 0530   CL  100* 05/31/2015 0530   CO2 26 05/31/2015 0530   GLUCOSE 118* 05/31/2015 0530   BUN 104* 05/31/2015 0530   BUN 11 05/13/2013   CREATININE 5.12* 05/31/2015 0530   CREATININE 1.0 05/13/2013   CALCIUM 7.8* 05/31/2015 0530   PROT 5.6* 05/29/2015 0452   ALBUMIN 1.6* 05/29/2015 0452   AST 18 05/29/2015 0452   ALT 33 05/29/2015 0452   ALKPHOS 86 05/29/2015 0452   BILITOT 0.7 05/29/2015 0452   GFRNONAA 9* 05/31/2015 0530   GFRAA 11* 05/31/2015 0530       Problem List:  Patient Active Problem List   Diagnosis Date Noted  . Abscess   . Encounter for nasogastric (NG) tube placement   . 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 Goals of care, counseling/discussion   . Acute renal failure with tubular necrosis (HCC)   . Palliative care encounter   . Acute respiratory failure (HCC)   . Septic shock (HCC)   . Sepsis (HCC) 05/23/2015  . Unspecified hypothyroidism 10/04/2013  . Hypotension 09/30/2013  . Edema 09/14/2013  . Atrial fibrillation with RVR (HCC) 08/11/2013  . Cellulitis and abscess of trunk 07/21/2013  . Abnormal weight gain 07/08/2013  . Anemia, unspecified 07/08/2013  . Encounter for long-term (current) use of other medications 07/04/2013  . Amenorrhea 06/07/2013  . Open wound of knee, leg (except thigh), and ankle, complicated 06/03/2013  . Hypokalemia 05/12/2013  . Unspecified protein-calorie malnutrition (HCC) 05/12/2013  . Physical deconditioning 05/12/2013  . Morbid obesity (HCC)   . Lymphedema of both lower extremities   . Diastolic CHF (HCC)   . Vitamin D deficiency      Palliative Care Assessment & Plan    1.Code Status: Limited code   2. Goals of Care/Additional Recommendations:  Full scope treatment - patient wants to improve and walk again.  Limitations on Scope of Treatment: No Hemodialysis per nephrology  - as the patient can not fit into the chair.  Desire for further Chaplaincy support: yes  Psycho-social Needs: Caregiving  Support/Resources  3. Symptom Management:      Per Primary team.   Fentanyl is ordered PRN for pain but fortunately the patient is not requiring it.  4. Palliative Prophylaxis:   Frequent Pain Assessment, Palliative Wound Care and Turn Reposition  5. Prognosis:  Difficult to determine.  Patient has a MDR bacteremia, a GFR of approximately 11, she has chronic cellulitis with non healing wounds, is massively obese and bedbound.  Likely less than 6 months.   At high risk for an acute event.  6. Discharge Planning:  Skilled Nursing Facility for rehab with Palliative care service follow-up.  Patient requests SNF closer to [facility] where her sister  lives.   Care plan was discussed with patient, dtr, sister at bedside.  Thank you for allowing the Palliative Medicine Team to assist in the care of this patient.   Time In: 11:00 Time Out: 11:35 Total Time 35  no       Algis Downs, [place] [facility] Pager: [phone]  05/31/2015, 3:40 PM  Please contact Palliative Medicine Team phone at [phone] for questions and concerns.

## 2015-05-31 NOTE — Progress Notes (Signed)
[facility] PROGRESS NOTE  Michele Weber ZOX:[id number] DOB: January 19, 1971 DOA: 05/23/2015 PCP: Thad Ranger, MD  HPI/Brief narrative Morbidly obese 850lb pt who presented initially with AMS and hypotension with presenting sepsis with GNR bacteremia.  Assessment/Plan: 1. Gm neg bacteremia (acinetobacter) with sepsis present on admission 1. WBC today at 32k with continued tachycardia 2. Appreciate input by ID. Recs to continue meropenem. May benefit from more doses of colistin, however, concerned about patient's renal insufficiency 3. Overall poor prognosis 4. Have asked PCCM regarding replacing central line, however given patient's morbid obesity, successfully placing a new line may prove to be a challenge 2. Morbid obesity 1. Stable thus far 3. Hypoxemic hypercapnic resp failure secondary to morbid obesity 1. Per Pulmonary, CPAP QHS and can make PRN BiPAP. No intubation 4. Afib with RVR 1. Tachycardic. Suspect worsened by active sepsis 2. Cont gentle hydration as tolerated, but would avoid over-hydration. Pt is +15L 5. Acute on CKD 1. Per above, given gentle IVF but will have to avoid overhydration as pt is already 15L pos 2. Cr has rising slightly overnight 3. Try to avoid nephrotoxic agents as possible 6. Altered mental status 1. Seems improved 2. Pt axo x3 today 7. Bulla ruptures 1. Noted in multiple areas under the patient 2. Do not appear infected at present with good granulation seen 3. Appreciate input by WOC. Recs noted 8. DVT prophylaxis 1. SCD's  Code Status: No intubation or chest compressions Family Communication: Pt in room Disposition Plan: Uncertain at this time   Consultants:  ID  Critical Care  Procedures:    Antibiotics: Anti-infectives    Start     Dose/Rate Route Frequency Ordered Stop   05/31/15 1000  ceftazidime-avibactam (AVYCAZ) 1.25 g in dextrose 5 % 50 mL IVPB     1.25 g 25 mL/hr over 2 Hours Intravenous Every 8 hours 05/31/15 0914      05/31/15 0900  ceftazidime-avibactam (AVYCAZ) 2.5 g in dextrose 5 % 50 mL IVPB  Status:  Discontinued     2.5 g 25 mL/hr over 2 Hours Intravenous 3 times per day 05/31/15 0851 05/31/15 0857   05/30/15 1800  colistimethate (COLYMYCIN) 300 mg in sodium chloride 0.9 % 100 mL IVPB     300 mg 208 mL/hr over 30 Minutes Intravenous  Once 05/30/15 1716 05/30/15 1847   05/30/15 0405  vancomycin (VANCOCIN) 2,000 mg in sodium chloride 0.9 % 500 mL IVPB  Status:  Discontinued     2,000 mg 250 mL/hr over 120 Minutes Intravenous Every 48 hours 05/28/15 0842 05/28/15 1334   05/28/15 1700  meropenem (MERREM) 2 g in sodium chloride 0.9 % 100 mL IVPB  Status:  Discontinued     2 g 200 mL/hr over 30 Minutes Intravenous Every 12 hours 05/28/15 0841 05/31/15 0851   05/28/15 0400  vancomycin (VANCOCIN) 1,750 mg in sodium chloride 0.9 % 500 mL IVPB  Status:  Discontinued     1,750 mg 250 mL/hr over 120 Minutes Intravenous Every 24 hours 05/28/15 0330 05/28/15 0842   05/25/15 1700  meropenem (MERREM) 1 g in sodium chloride 0.9 % 100 mL IVPB  Status:  Discontinued     1 g 200 mL/hr over 30 Minutes Intravenous Every 12 hours 05/25/15 1255 05/28/15 0841   05/24/15 0945  meropenem (MERREM) 1 g in sodium chloride 0.9 % 100 mL IVPB  Status:  Discontinued     1 g 200 mL/hr over 30 Minutes Intravenous 3 times per day 05/24/15 0939 05/25/15 1255  05/23/15 2200  piperacillin-tazobactam (ZOSYN) IVPB 3.375 g  Status:  Discontinued     3.375 g 12.5 mL/hr over 240 Minutes Intravenous 3 times per day 05/23/15 1257 05/24/15 0939   05/23/15 1300  vancomycin (VANCOCIN) 2,500 mg in sodium chloride 0.9 % 500 mL IVPB     2,500 mg 250 mL/hr over 120 Minutes Intravenous  Once 05/23/15 1257 05/23/15 1656   05/23/15 1300  piperacillin-tazobactam (ZOSYN) IVPB 3.375 g     3.375 g 100 mL/hr over 30 Minutes Intravenous  Once 05/23/15 1257 05/23/15 1417      HPI/Subjective: Reports feeling better today  Objective: Filed Vitals:    05/31/15 0400 05/31/15 0500 05/31/15 0746 05/31/15 1100  BP: 104/64  95/65 104/49  Pulse: 125  122 112  Temp: 97.6 F (36.4 C)  98.2 F (36.8 C) 97.6 F (36.4 C)  TempSrc: Oral  Axillary Axillary  Resp: Height:      Weight:  300.735 kg (663 lb)    SpO2: 100%  100% 93%    Intake/Output Summary (Last 24 hours) at 05/31/15 1404 Last data filed at 05/31/15 0430  Gross per 24 hour  Intake 1302.5 ml  Output   1100 ml  Net  202.5 ml   Filed Weights   05/29/15 0446 05/30/15 0500 05/31/15 0500  Weight: 317.518 kg (700 lb) 303.91 kg (670 lb) 300.735 kg (663 lb)    Exam:   General:  Awake, in nad, laying in bed  Cardiovascular: tachycardic, s1, s2  Respiratory: normal resp effort, no wheezing  Abdomen: morbidly obese, nondistended  Musculoskeletal: perfused, no clubbing, no cyanosis  Data Reviewed: Basic Metabolic Panel:  Recent Labs Lab 05/25/15 0521 05/26/15 0415 05/28/15 0410 05/29/15 0452 05/31/15 0530  NA 133* 137 138 139 139  K 5.0 5.0 4.8 4.6 4.1  CL 99* 101 100* 100* 100*  CO2 19* 21* GLUCOSE 123* 139* 162* 149* 118*  BUN 60* 64* 76* 83* 104*  CREATININE 3.84* 4.18* 5.02* 5.02* 5.12*  CALCIUM 6.9* 6.9* 7.2* 7.3* 7.8*  MG  --  2.0 2.0  --   --   PHOS  --  4.5  --   --   --    Liver Function Tests:  Recent Labs Lab 05/29/15 0452  AST 18  ALT 33  ALKPHOS 86  BILITOT 0.7  PROT 5.6*  ALBUMIN 1.6*   No results for input(s): LIPASE, AMYLASE in the last 168 hours. No results for input(s): AMMONIA in the last 168 hours. CBC:  Recent Labs Lab 05/25/15 0521 05/26/15 0415 05/28/15 0410 05/29/15 0452 05/31/15 0530  WBC 14.7* 28.9* 39.1* 39.3* 32.2*  HGB 11.1* 10.4* 9.7* 10.2* 9.9*  HCT 32.9* 30.8* 29.6* 30.4* 30.1*  MCV 92.4 88.8 91.4 91.8 91.5  PLT 35* 48* 36* 66* 130*   Cardiac Enzymes: No results for input(s): CKTOTAL, CKMB, CKMBINDEX, TROPONINI in the last 168 hours. BNP (last 3 results) No results for input(s):  BNP in the last 8760 hours.  ProBNP (last 3 results) No results for input(s): PROBNP in the last 8760 hours.  CBG:  Recent Labs Lab 05/30/15 2053 05/31/15 0007 05/31/15 0416 05/31/15 0746 05/31/15 1143  GLUCAP 109* 121* 107* 98 134*    Recent Results (from the past 240 hour(s))  MRSA PCR Screening     Status: None   Collection Time: 05/23/15  1:42 PM  Result Value Ref Range Status   MRSA by PCR NEGATIVE NEGATIVE Final  Comment:        The GeneXpert MRSA Assay (FDA approved for NASAL specimens only), is one component of a comprehensive MRSA colonization surveillance program. It is not intended to diagnose MRSA infection nor to guide or monitor treatment for MRSA infections.   Culture, blood (routine x 2)     Status: Abnormal   Collection Time: 05/23/15  2:53 PM  Result Value Ref Range Status   Specimen Description BLOOD RIGHT ARM  Final   Special Requests IN PEDIATRIC BOTTLE  3CC  Final   Culture  Setup Time (A)  Final    GRAM NEGATIVE DIPLOCOCCI AEROBIC BOTTLE ONLY CRITICAL RESULT CALLED TO, READ BACK BY AND VERIFIED WITH: C WOFFORD,RN @0415  05/24/15 MKELLY CORRECTED RESULTS GRAM NEGATIVE RODS CORRECTED RESULTS CALLED TO: J. SMITH,RN AT 1610 ON [id number] BY Lucienne Capers    Culture (A)  Final    ACINETOBACTER CALCOACETICUS/BAUMANNII COMPLEX SUSCEPTIBILITIES PERFORMED ON PREVIOUS CULTURE WITHIN THE LAST 5 DAYS.    Report Status 05/26/2015 FINAL  Final  Culture, blood (routine x 2)     Status: Abnormal   Collection Time: 05/23/15  3:05 PM  Result Value Ref Range Status   Specimen Description BLOOD LEFT ARM  Final   Special Requests IN PEDIATRIC BOTTLE 3CC  Final   Culture  Setup Time (A)  Final    GRAM NEGATIVE DIPLOCOCCI AEROBIC BOTTLE ONLY CRITICAL RESULT CALLED TO, READ BACK BY AND VERIFIED WITH: C WOFFORD,RN @0415  05/24/15 MKELLY CORRECTED RESULTS GRAM NEGATIVE RODS CORRECTED RESULTS CALLED TO: J. SMITH,RN AT 0981 ON [id number] BY Lucienne Capers    Culture  ACINETOBACTER CALCOACETICUS/BAUMANNII COMPLEX (A)  Final   Report Status 05/26/2015 FINAL  Final   Organism ID, Bacteria ACINETOBACTER CALCOACETICUS/BAUMANNII COMPLEX  Final      Susceptibility   Acinetobacter calcoaceticus/baumannii complex - MIC*    CEFTAZIDIME 8 SENSITIVE Sensitive     CEFTRIAXONE 16 INTERMEDIATE Intermediate     CIPROFLOXACIN >=4 RESISTANT Resistant     GENTAMICIN >=16 RESISTANT Resistant     IMIPENEM 0.5 SENSITIVE Sensitive     PIP/TAZO 16 SENSITIVE Sensitive     TRIMETH/SULFA >=320 RESISTANT Resistant     AMPICILLIN/SULBACTAM <=2 SENSITIVE Sensitive     * ACINETOBACTER CALCOACETICUS/BAUMANNII COMPLEX  Urine culture     Status: None   Collection Time: 05/23/15  6:04 PM  Result Value Ref Range Status   Specimen Description URINE, CATHETERIZED  Final   Special Requests NONE  Final   Culture NO GROWTH 1 DAY  Final   Report Status 05/24/2015 FINAL  Final  Wound culture     Status: None   Collection Time: 05/23/15  6:04 PM  Result Value Ref Range Status   Specimen Description WOUND LEFT THIGH  Final   Special Requests NONE  Final   Gram Stain   Final    NO WBC SEEN NO SQUAMOUS EPITHELIAL CELLS SEEN NO ORGANISMS SEEN Performed at [facility]    Culture   Final    ABUNDANT ACINETOBACTER CALCOACETICUS/BAUMANNII COMPLEX Performed at [facility]    Report Status 05/26/2015 FINAL  Final   Organism ID, Bacteria ACINETOBACTER CALCOACETICUS/BAUMANNII COMPLEX  Final      Susceptibility   Acinetobacter calcoaceticus/baumannii complex - MIC*    AMPICILLIN >=32 RESISTANT Resistant     AMPICILLIN/SULBACTAM <=2 SENSITIVE Sensitive     CEFAZOLIN >=64 RESISTANT Resistant     CEFTAZIDIME 4 SENSITIVE Sensitive     CEFTRIAXONE 16 INTERMEDIATE Intermediate     CIPROFLOXACIN >=4  RESISTANT Resistant     GENTAMICIN >=16 RESISTANT Resistant     IMIPENEM 1 SENSITIVE Sensitive     PIP/TAZO 32 INTERMEDIATE Intermediate     TOBRAMYCIN 8 INTERMEDIATE Intermediate      TRIMETH/SULFA Value in next row Resistant      >=320 RESISTANT(NOTE)    * ABUNDANT ACINETOBACTER CALCOACETICUS/BAUMANNII COMPLEX  Culture, blood (routine x 2)     Status: Abnormal (Preliminary result)   Collection Time: 05/27/15 12:01 PM  Result Value Ref Range Status   Specimen Description BLOOD CENTRAL LINE  Final   Special Requests BOTTLES DRAWN AEROBIC AND ANAEROBIC 10CC  Final   Culture  Setup Time   Final    GRAM NEGATIVE COCCOBACILLI IN BOTH AEROBIC AND ANAEROBIC BOTTLES CRITICAL RESULT CALLED TO, READ BACK BY AND VERIFIED WITH: TO BSHEPARD(RN) BY TCLEVELAND 05/28/2015 AT 2:52AM CORRECTED RESULTS CALLED TO: L WILSON,RN AT 1610 05/28/15 BY L BENFIELD PREVIOUSLY REPORTED AS GRAM POSITIVE COCCI    Culture (A)  Final    ACINETOBACTER CALCOACETICUS/BAUMANNII COMPLEX MULTI-DRUG RESISTANT ORGANISM SENDING TO REFERENCE LAB FOR COLISITIN AND AMIKACIN SUSCEPTIBILITIES    Report Status PENDING  Incomplete   Organism ID, Bacteria ACINETOBACTER CALCOACETICUS/BAUMANNII COMPLEX  Final      Susceptibility   Acinetobacter calcoaceticus/baumannii complex - MIC*    CEFTAZIDIME 16 INTERMEDIATE Intermediate     CEFTRIAXONE >=64 RESISTANT Resistant     CIPROFLOXACIN >=4 RESISTANT Resistant     GENTAMICIN >=16 RESISTANT Resistant     IMIPENEM >=16 RESISTANT Resistant     PIP/TAZO >=128 RESISTANT Resistant     TRIMETH/SULFA >=320 RESISTANT Resistant     AMPICILLIN/SULBACTAM 16 INTERMEDIATE Intermediate     * ACINETOBACTER CALCOACETICUS/BAUMANNII COMPLEX  Culture, blood (routine x 2)     Status: Abnormal   Collection Time: 05/27/15 12:07 PM  Result Value Ref Range Status   Specimen Description BLOOD RIGHT HAND  Final   Special Requests BOTTLES DRAWN AEROBIC ONLY 5CC  Final   Culture  Setup Time   Final    GRAM NEGATIVE COCCOBACILLI AEROBIC BOTTLE ONLY CRITICAL RESULT CALLED TO, READ BACK BY AND VERIFIED WITH: TO BSHEPARD (RN) BY TCLEVELAND AT 2:54AM CORRECTED RESULTS CALLED TO: L WILSON,RN  AT 9604 05/28/15 BY L BENFIELD PREVIOUSLY REPORTED AS GRAM POSITIVE COCCI    Culture (A)  Final    ACINETOBACTER CALCOACETICUS/BAUMANNII COMPLEX SUSCEPTIBILITIES PERFORMED ON PREVIOUS CULTURE WITHIN THE LAST 5 DAYS.    Report Status 05/31/2015 FINAL  Final  Culture, blood (Routine X 2) w Reflex to ID Panel     Status: None (Preliminary result)   Collection Time: 05/29/15  9:43 AM  Result Value Ref Range Status   Specimen Description BLOOD LEFT ARM  Final   Special Requests IN PEDIATRIC BOTTLE  4CC  Final   Culture NO GROWTH 2 DAYS  Final   Report Status PENDING  Incomplete  Culture, blood (Routine X 2) w Reflex to ID Panel     Status: None (Preliminary result)   Collection Time: 05/29/15  9:50 AM  Result Value Ref Range Status   Specimen Description BLOOD LEFT ARM  Final   Special Requests IN PEDIATRIC BOTTLE  4CC  Final   Culture NO GROWTH 2 DAYS  Final   Report Status PENDING  Incomplete     Studies: No results found.  Scheduled Meds: . amiodarone  400 mg Oral BID  . antiseptic oral rinse  7 mL Mouth Rinse q12n4p  . ceftazidime avibactam (AVYCAZ) IVPB  1.25 g Intravenous Q8H  .  chlorhexidine  15 mL Mouth Rinse BID  . feeding supplement (PRO-STAT SUGAR FREE 64)  30 mL Per Tube TID  . insulin aspart  0-9 Units Subcutaneous 6 times per day  . levothyroxine  25 mcg Intravenous Daily  . pantoprazole (PROTONIX) IV  40 mg Intravenous Q24H  . silver sulfADIAZINE   Topical Daily   Continuous Infusions: . sodium chloride 75 mL/hr at 05/31/15 1033  . norepinephrine (LEVOPHED) Adult infusion    . vasopressin (PITRESSIN) infusion - *FOR SHOCK* Stopped (05/28/15 [id number])    Principal Problem:   MDR Acinetobacter baumannii infection Active Problems:   Morbid obesity (HCC)   Lymphedema of both lower extremities   Cellulitis and abscess of trunk   Sepsis (HCC)   Septic shock (HCC)   Acute respiratory failure (HCC)   Palliative care encounter   Goals of care, counseling/discussion    Acute renal failure with tubular necrosis (HCC)   Abscess    Aleks Nawrot K  [facility] Pager [phone]. If 7PM-7AM, please contact night-coverage at [email or web address], password [facility] [email or web address] 05/31/2015, 2:04 PM  LOS: 8 days

## 2015-06-01 DIAGNOSIS — Z452 Encounter for adjustment and management of vascular access device: Secondary | ICD-10-CM | POA: Insufficient documentation

## 2015-06-01 LAB — CBC
HCT: 30.6 % — ABNORMAL LOW (ref 36.0–46.0)
Hemoglobin: 10 g/dL — ABNORMAL LOW (ref 12.0–15.0)
MCH: 29.9 pg (ref 26.0–34.0)
MCHC: 32.7 g/dL (ref 30.0–36.0)
MCV: 91.6 fL (ref 78.0–100.0)
PLATELETS: 147 10*3/uL — AB (ref 150–400)
RBC: 3.34 MIL/uL — AB (ref 3.87–5.11)
RDW: 16 % — AB (ref 11.5–15.5)
WBC: 26.7 10*3/uL — ABNORMAL HIGH (ref 4.0–10.5)

## 2015-06-01 LAB — BASIC METABOLIC PANEL
ANION GAP: 13 (ref 5–15)
BUN: 107 mg/dL — ABNORMAL HIGH (ref 6–20)
CHLORIDE: 99 mmol/L — AB (ref 101–111)
CO2: 25 mmol/L (ref 22–32)
Calcium: 8 mg/dL — ABNORMAL LOW (ref 8.9–10.3)
Creatinine, Ser: 5.17 mg/dL — ABNORMAL HIGH (ref 0.44–1.00)
GFR calc non Af Amer: 9 mL/min — ABNORMAL LOW (ref >60)
GFR, EST AFRICAN AMERICAN: 11 mL/min — AB (ref >60)
Glucose, Bld: 97 mg/dL (ref 65–99)
POTASSIUM: 4.4 mmol/L (ref 3.5–5.1)
Sodium: 137 mmol/L (ref 135–145)

## 2015-06-01 LAB — GLUCOSE, CAPILLARY
GLUCOSE-CAPILLARY: 101 mg/dL — AB (ref 65–99)
GLUCOSE-CAPILLARY: 85 mg/dL (ref 65–99)
GLUCOSE-CAPILLARY: 95 mg/dL (ref 65–99)
Glucose-Capillary: 90 mg/dL (ref 65–99)
Glucose-Capillary: 104 mg/dL — ABNORMAL HIGH (ref 65–99)
Glucose-Capillary: 104 mg/dL — ABNORMAL HIGH (ref 65–99)

## 2015-06-01 MED ORDER — METOPROLOL TARTRATE 25 MG PO TABS
25.0000 mg | ORAL_TABLET | Freq: Two times a day (BID) | ORAL | Status: DC
  Administered 2015-06-01 (×2): 25 mg via ORAL
  Filled 2015-06-01 (×2): qty 1

## 2015-06-01 MED ORDER — SODIUM CHLORIDE 0.9 % IV SOLN
100.0000 mg | Freq: Two times a day (BID) | INTRAVENOUS | Status: DC
  Administered 2015-06-01 – 2015-06-02 (×2): 100 mg via INTRAVENOUS
  Filled 2015-06-01 (×5): qty 100

## 2015-06-01 MED ORDER — LEVOTHYROXINE SODIUM 50 MCG PO TABS
50.0000 ug | ORAL_TABLET | Freq: Every day | ORAL | Status: DC
  Administered 2015-06-02 – 2015-06-08 (×7): 50 ug via ORAL
  Filled 2015-06-01 (×8): qty 1

## 2015-06-01 MED ORDER — DEXTROSE 5 % IV SOLN: 0.9400 g | INTRAVENOUS | Status: DC

## 2015-06-01 MED ORDER — SODIUM CHLORIDE 0.9 % IV SOLN: Freq: Two times a day (BID) | INTRAVENOUS | Status: DC

## 2015-06-01 MED ORDER — SODIUM CHLORIDE 0.9 % IV SOLN
100.0000 mg | Freq: Two times a day (BID) | INTRAVENOUS | Status: DC
  Administered 2015-06-01: 100 mg via INTRAVENOUS
  Filled 2015-06-01 (×2): qty 250

## 2015-06-01 NOTE — Progress Notes (Signed)
[facility] PROGRESS NOTE  Michele MowersSharon Weber BJY:[id number]:[id number] DOB: Nov 15, 1970 DOA: 05/23/2015 PCP: Thad RangerAI,RIPUDEEP, MD  HPI/Brief narrative Morbidly obese 850lb pt who presented initially with AMS and hypotension with presenting sepsis with GNR bacteremia.  Assessment/Plan: 1. Gm neg bacteremia (acinetobacter) with sepsis present on admission 1. WBC today at 32k with continued tachycardia 2. Appreciate input by ID. Recs to continue meropenem for now. May benefit from more doses of colistin, however, concerned about patient's renal insufficiency 3. Overall poor prognosis 4. Central line successfully replaced on 4/13 by PCCM 2. Morbid obesity 1. Stable thus far 3. Hypoxemic hypercapnic resp failure secondary to morbid obesity 1. Per Pulmonary, CPAP QHS and can make PRN BiPAP. No intubation 4. Afib with RVR 1. Tachycardic. Suspect worsened by active sepsis 2. On amiodarone 400mg  bid 3. Will add metoprolol, titrate as BP tolerates 5. Acute on CKD 1. Was given gentle IVF but will have to avoid overhydration as pt is already 15.5L pos 2. Difficult to assess volume status given morbid obesity 3. Cr continues to rise 4. Try to cont to avoid nephrotoxic agents as possible 6. Altered mental status 1. Seems improved 2. Pt axo x3 today 7. Bulla ruptures 1. Noted in multiple areas under the patient 2. Do not appear infected at present with good granulation seen 3. Appreciate input by WOC. Recs noted 8. DVT prophylaxis 1. SCD's  Code Status: No intubation or chest compressions Family Communication: Pt in room Disposition Plan: Uncertain at this time, remains in stepdown   Consultants:  ID  Critical Care  Palliative Care  Procedures:    Antibiotics: Anti-infectives    Start     Dose/Rate Route Frequency Ordered Stop   06/01/15 1000  Minocycline HCl infusion 100 mg     100 mg 4.2 mL/hr over 60 Minutes Intravenous Every 12 hours 06/01/15 0911 06/06/15 0959   06/01/15 1000   sodium chloride 0.9 % 1,000 mL with Minocycline HCl 100 mg infusion  Status:  Discontinued     4.2 mL/hr  Intravenous Every 12 hours 06/01/15 0926 06/01/15 0926   06/01/15 0930  ceftazidime-avibactam (AVYCAZ) 0.94 g in dextrose 5 % 50 mL IVPB  Status:  Discontinued     0.94 g 25 mL/hr over 2 Hours Intravenous Every 24 hours 06/01/15 0918 06/01/15 0923   05/31/15 1000  ceftazidime-avibactam (AVYCAZ) 1.25 g in dextrose 5 % 50 mL IVPB     1.25 g 25 mL/hr over 2 Hours Intravenous Every 8 hours 05/31/15 0914     05/31/15 0900  ceftazidime-avibactam (AVYCAZ) 2.5 g in dextrose 5 % 50 mL IVPB  Status:  Discontinued     2.5 g 25 mL/hr over 2 Hours Intravenous 3 times per day 05/31/15 0851 05/31/15 0857   05/30/15 1800  colistimethate (COLYMYCIN) 300 mg in sodium chloride 0.9 % 100 mL IVPB     300 mg 208 mL/hr over 30 Minutes Intravenous  Once 05/30/15 1716 05/30/15 1847   05/30/15 0405  vancomycin (VANCOCIN) 2,000 mg in sodium chloride 0.9 % 500 mL IVPB  Status:  Discontinued     2,000 mg 250 mL/hr over 120 Minutes Intravenous Every 48 hours 05/28/15 0842 05/28/15 1334   05/28/15 1700  meropenem (MERREM) 2 g in sodium chloride 0.9 % 100 mL IVPB  Status:  Discontinued     2 g 200 mL/hr over 30 Minutes Intravenous Every 12 hours 05/28/15 0841 05/31/15 0851   05/28/15 0400  vancomycin (VANCOCIN) 1,750 mg in sodium chloride 0.9 % 500 mL IVPB  Status:  Discontinued     1,750 mg 250 mL/hr over 120 Minutes Intravenous Every 24 hours 05/28/15 0330 05/28/15 0842   05/25/15 1700  meropenem (MERREM) 1 g in sodium chloride 0.9 % 100 mL IVPB  Status:  Discontinued     1 g 200 mL/hr over 30 Minutes Intravenous Every 12 hours 05/25/15 1255 05/28/15 0841   05/24/15 0945  meropenem (MERREM) 1 g in sodium chloride 0.9 % 100 mL IVPB  Status:  Discontinued     1 g 200 mL/hr over 30 Minutes Intravenous 3 times per day 05/24/15 0939 05/25/15 1255   05/23/15 2200  piperacillin-tazobactam (ZOSYN) IVPB 3.375 g  Status:   Discontinued     3.375 g 12.5 mL/hr over 240 Minutes Intravenous 3 times per day 05/23/15 1257 05/24/15 0939   05/23/15 1300  vancomycin (VANCOCIN) 2,500 mg in sodium chloride 0.9 % 500 mL IVPB     2,500 mg 250 mL/hr over 120 Minutes Intravenous  Once 05/23/15 1257 05/23/15 1656   05/23/15 1300  piperacillin-tazobactam (ZOSYN) IVPB 3.375 g     3.375 g 100 mL/hr over 30 Minutes Intravenous  Once 05/23/15 1257 05/23/15 1417      HPI/Subjective: No complaints this AM  Objective: Filed Vitals:   06/01/15 0600 06/01/15 0755 06/01/15 1000 06/01/15 1217  BP: 130/86 140/101  128/67  Pulse: 118 135  136  Temp:  97.5 F (36.4 C)  97.5 F (36.4 C)  TempSrc:  Oral  Oral  Resp: 14 25  14   Height:      Weight:   301.188 kg (664 lb)   SpO2: 100% 100%  100%    Intake/Output Summary (Last 24 hours) at 06/01/15 1450 Last data filed at 06/01/15 0008  Gross per 24 hour  Intake    275 ml  Output    850 ml  Net   -575 ml   Filed Weights   05/31/15 0500 06/01/15 0436 06/01/15 1000  Weight: 300.735 kg (663 lb) 196.408 kg (433 lb) 301.188 kg (664 lb)    Exam:   General:  Awake, in nad, laying in bed  Cardiovascular: tachycardic, s1, s2  Respiratory: normal resp effort, no wheezing  Abdomen: morbidly obese, nondistended, pos BS  Musculoskeletal: perfused, no cyanosis  Data Reviewed: Basic Metabolic Panel:  Recent Labs Lab 05/26/15 0415 05/28/15 0410 05/29/15 0452 05/31/15 0530 06/01/15 0457  NA 137 138 139 139 137  K 5.0 4.8 4.6 4.1 4.4  CL 101 100* 100* 100* 99*  CO2 21* 24 25 26 25   GLUCOSE 139* 162* 149* 118* 97  BUN 64* 76* 83* 104* 107*  CREATININE 4.18* 5.02* 5.02* 5.12* 5.17*  CALCIUM 6.9* 7.2* 7.3* 7.8* 8.0*  MG 2.0 2.0  --   --   --   PHOS 4.5  --   --   --   --    Liver Function Tests:  Recent Labs Lab 05/29/15 0452  AST 18  ALT 33  ALKPHOS 86  BILITOT 0.7  PROT 5.6*  ALBUMIN 1.6*   No results for input(s): LIPASE, AMYLASE in the last 168  hours. No results for input(s): AMMONIA in the last 168 hours. CBC:  Recent Labs Lab 05/26/15 0415 05/28/15 0410 05/29/15 0452 05/31/15 0530 06/01/15 0457  WBC 28.9* 39.1* 39.3* 32.2* 26.7*  HGB 10.4* 9.7* 10.2* 9.9* 10.0*  HCT 30.8* 29.6* 30.4* 30.1* 30.6*  MCV 88.8 91.4 91.8 91.5 91.6  PLT 48* 36* 66* 130* 147*   Cardiac Enzymes: No  results for input(s): CKTOTAL, CKMB, CKMBINDEX, TROPONINI in the last 168 hours. BNP (last 3 results) No results for input(s): BNP in the last 8760 hours.  ProBNP (last 3 results) No results for input(s): PROBNP in the last 8760 hours.  CBG:  Recent Labs Lab 05/31/15 1935 06/01/15 0003 06/01/15 0410 06/01/15 0752 06/01/15 1216  GLUCAP 117* 95 90 85 101*    Recent Results (from the past 240 hour(s))  MRSA PCR Screening     Status: None   Collection Time: 05/23/15  1:42 PM  Result Value Ref Range Status   MRSA by PCR NEGATIVE NEGATIVE Final    Comment:        The GeneXpert MRSA Assay (FDA approved for NASAL specimens only), is one component of a comprehensive MRSA colonization surveillance program. It is not intended to diagnose MRSA infection nor to guide or monitor treatment for MRSA infections.   Culture, blood (routine x 2)     Status: Abnormal   Collection Time: 05/23/15  2:53 PM  Result Value Ref Range Status   Specimen Description BLOOD RIGHT ARM  Final   Special Requests IN PEDIATRIC BOTTLE  3CC  Final   Culture  Setup Time (A)  Final    GRAM NEGATIVE DIPLOCOCCI AEROBIC BOTTLE ONLY CRITICAL RESULT CALLED TO, READ BACK BY AND VERIFIED WITH: C WOFFORD,RN  05/24/15 MKELLY CORRECTED RESULTS GRAM NEGATIVE RODS CORRECTED RESULTS CALLED TO: J. SMITH,RN AT 1610 ON [id number] BY Lucienne Capers    Culture (A)  Final    ACINETOBACTER CALCOACETICUS/BAUMANNII COMPLEX SUSCEPTIBILITIES PERFORMED ON PREVIOUS CULTURE WITHIN THE LAST 5 DAYS.    Report Status 05/26/2015 FINAL  Final  Culture, blood (routine x 2)     Status:  Abnormal   Collection Time: 05/23/15  3:05 PM  Result Value Ref Range Status   Specimen Description BLOOD LEFT ARM  Final   Special Requests IN PEDIATRIC BOTTLE 3CC  Final   Culture  Setup Time (A)  Final    GRAM NEGATIVE DIPLOCOCCI AEROBIC BOTTLE ONLY CRITICAL RESULT CALLED TO, READ BACK BY AND VERIFIED WITH: C WOFFORD,RN  05/24/15 MKELLY CORRECTED RESULTS GRAM NEGATIVE RODS CORRECTED RESULTS CALLED TO: J. SMITH,RN AT 0981 ON [id number] BY Lucienne Capers    Culture ACINETOBACTER CALCOACETICUS/BAUMANNII COMPLEX (A)  Final   Report Status 05/26/2015 FINAL  Final   Organism ID, Bacteria ACINETOBACTER CALCOACETICUS/BAUMANNII COMPLEX  Final      Susceptibility   Acinetobacter calcoaceticus/baumannii complex - MIC*    CEFTAZIDIME 8 SENSITIVE Sensitive     CEFTRIAXONE 16 INTERMEDIATE Intermediate     CIPROFLOXACIN >=4 RESISTANT Resistant     GENTAMICIN >=16 RESISTANT Resistant     IMIPENEM 0.5 SENSITIVE Sensitive     PIP/TAZO 16 SENSITIVE Sensitive     TRIMETH/SULFA >=320 RESISTANT Resistant     AMPICILLIN/SULBACTAM <=2 SENSITIVE Sensitive     * ACINETOBACTER CALCOACETICUS/BAUMANNII COMPLEX  Urine culture     Status: None   Collection Time: 05/23/15  6:04 PM  Result Value Ref Range Status   Specimen Description URINE, CATHETERIZED  Final   Special Requests NONE  Final   Culture NO GROWTH 1 DAY  Final   Report Status 05/24/2015 FINAL  Final  Wound culture     Status: None   Collection Time: 05/23/15  6:04 PM  Result Value Ref Range Status   Specimen Description WOUND LEFT THIGH  Final   Special Requests NONE  Final   Gram Stain   Final    NO WBC SEEN  NO SQUAMOUS EPITHELIAL CELLS SEEN NO ORGANISMS SEEN Performed at [facility]    Culture   Final    ABUNDANT ACINETOBACTER CALCOACETICUS/BAUMANNII COMPLEX Performed at [facility]    Report Status 05/26/2015 FINAL  Final   Organism ID, Bacteria ACINETOBACTER CALCOACETICUS/BAUMANNII COMPLEX  Final       Susceptibility   Acinetobacter calcoaceticus/baumannii complex - MIC*    AMPICILLIN >=32 RESISTANT Resistant     AMPICILLIN/SULBACTAM <=2 SENSITIVE Sensitive     CEFAZOLIN >=64 RESISTANT Resistant     CEFTAZIDIME 4 SENSITIVE Sensitive     CEFTRIAXONE 16 INTERMEDIATE Intermediate     CIPROFLOXACIN >=4 RESISTANT Resistant     GENTAMICIN >=16 RESISTANT Resistant     IMIPENEM 1 SENSITIVE Sensitive     PIP/TAZO 32 INTERMEDIATE Intermediate     TOBRAMYCIN 8 INTERMEDIATE Intermediate     TRIMETH/SULFA Value in next row Resistant      >=320 RESISTANT(NOTE)    * ABUNDANT ACINETOBACTER CALCOACETICUS/BAUMANNII COMPLEX  Culture, blood (routine x 2)     Status: Abnormal (Preliminary result)   Collection Time: 05/27/15 12:01 PM  Result Value Ref Range Status   Specimen Description BLOOD CENTRAL LINE  Final   Special Requests BOTTLES DRAWN AEROBIC AND ANAEROBIC 10CC  Final   Culture  Setup Time   Final    GRAM NEGATIVE COCCOBACILLI IN BOTH AEROBIC AND ANAEROBIC BOTTLES CRITICAL RESULT CALLED TO, READ BACK BY AND VERIFIED WITH: TO BSHEPARD(RN) BY TCLEVELAND 05/28/2015 AT 2:52AM CORRECTED RESULTS CALLED TO: L WILSON,RN AT 4540 05/28/15 BY L BENFIELD PREVIOUSLY REPORTED AS GRAM POSITIVE COCCI    Culture (A)  Final    ACINETOBACTER CALCOACETICUS/BAUMANNII COMPLEX MULTI-DRUG RESISTANT ORGANISM SENDING TO REFERENCE LAB FOR COLISITIN, TETRACYCLINE AND AMIKACIN SUSCEPTIBILITIES    Report Status PENDING  Incomplete   Organism ID, Bacteria ACINETOBACTER CALCOACETICUS/BAUMANNII COMPLEX  Final      Susceptibility   Acinetobacter calcoaceticus/baumannii complex - MIC*    CEFTAZIDIME 16 INTERMEDIATE Intermediate     CEFTRIAXONE >=64 RESISTANT Resistant     CIPROFLOXACIN >=4 RESISTANT Resistant     GENTAMICIN >=16 RESISTANT Resistant     IMIPENEM >=16 RESISTANT Resistant     PIP/TAZO >=128 RESISTANT Resistant     TRIMETH/SULFA >=320 RESISTANT Resistant     AMPICILLIN/SULBACTAM 16 INTERMEDIATE Intermediate      * ACINETOBACTER CALCOACETICUS/BAUMANNII COMPLEX  Culture, blood (routine x 2)     Status: Abnormal   Collection Time: 05/27/15 12:07 PM  Result Value Ref Range Status   Specimen Description BLOOD RIGHT HAND  Final   Special Requests BOTTLES DRAWN AEROBIC ONLY 5CC  Final   Culture  Setup Time   Final    GRAM NEGATIVE COCCOBACILLI AEROBIC BOTTLE ONLY CRITICAL RESULT CALLED TO, READ BACK BY AND VERIFIED WITH: TO BSHEPARD (RN) BY TCLEVELAND AT 2:54AM CORRECTED RESULTS CALLED TO: L WILSON,RN AT 9811 05/28/15 BY L BENFIELD PREVIOUSLY REPORTED AS GRAM POSITIVE COCCI    Culture (A)  Final    ACINETOBACTER CALCOACETICUS/BAUMANNII COMPLEX SUSCEPTIBILITIES PERFORMED ON PREVIOUS CULTURE WITHIN THE LAST 5 DAYS.    Report Status 05/31/2015 FINAL  Final  Culture, blood (Routine X 2) w Reflex to ID Panel     Status: None (Preliminary result)   Collection Time: 05/29/15  9:43 AM  Result Value Ref Range Status   Specimen Description BLOOD LEFT ARM  Final   Special Requests IN PEDIATRIC BOTTLE  4CC  Final   Culture NO GROWTH 2 DAYS  Final   Report Status PENDING  Incomplete  Culture, blood (Routine X 2) w Reflex to ID Panel     Status: None (Preliminary result)   Collection Time: 05/29/15  9:50 AM  Result Value Ref Range Status   Specimen Description BLOOD LEFT ARM  Final   Special Requests IN PEDIATRIC BOTTLE  4CC  Final   Culture NO GROWTH 2 DAYS  Final   Report Status PENDING  Incomplete     Studies: Dg Chest Port 1 View  05/31/2015  CLINICAL DATA:  Central line placement EXAM: PORTABLE CHEST 1 VIEW COMPARISON:  05/24/2011 FINDINGS: New central line from a left jugular approach. Tip appears to be in the mid right atrium. Right jugular catheter in the SVC unchanged. No pneumothorax Cardiac enlargement. Improvement in vascular congestion. Improvement in bibasilar atelectasis. IMPRESSION: Left jugular central venous catheter tip in the mid right stroke atrium. No pneumothorax Improvement in  vascular congestion. Improvement in bibasilar atelectasis. Electronically Signed   By: Marlan Palau M.D.   On: 05/31/2015 15:37    Scheduled Meds: . amiodarone  400 mg Oral BID  . antiseptic oral rinse  7 mL Mouth Rinse q12n4p  . ceftazidime avibactam (AVYCAZ) IVPB  1.25 g Intravenous Q8H  . chlorhexidine  15 mL Mouth Rinse BID  . feeding supplement (PRO-STAT SUGAR FREE 64)  30 mL Per Tube TID  . insulin aspart  0-9 Units Subcutaneous 6 times per day  . [START ON 06/02/2015] levothyroxine  50 mcg Oral QAC breakfast  . Minocycline HCl infusion 100 mg  100 mg Intravenous Q12H  . pantoprazole (PROTONIX) IV  40 mg Intravenous Q24H  . silver sulfADIAZINE   Topical Daily   Continuous Infusions:    Principal Problem:   MDR Acinetobacter baumannii infection Active Problems:   Morbid obesity (HCC)   Lymphedema of both lower extremities   Cellulitis and abscess of trunk   Sepsis (HCC)   Septic shock (HCC)   Acute respiratory failure (HCC)   Palliative care encounter   Goals of care, counseling/discussion   Acute renal failure with tubular necrosis (HCC)   Abscess   Encounter for nasogastric (NG) tube placement   Encounter for hospice care discussion   Encounter for central line placement    CHIU, STEPHEN K  [facility] Pager [phone]. If 7PM-7AM, please contact night-coverage at [email or web address], password [facility] 06/01/2015, 2:50 PM  LOS: 9 days

## 2015-06-01 NOTE — Progress Notes (Signed)
Subjective: No new complaints   Antibiotics:  Anti-infectives    Start     Dose/Rate Route Frequency Ordered Stop   06/01/15 1000  Minocycline HCl infusion 100 mg     100 mg 4.2 mL/hr over 60 Minutes Intravenous Every 12 hours 06/01/15 0911 06/06/15 0959   06/01/15 1000  sodium chloride 0.9 % 1,000 mL with Minocycline HCl 100 mg infusion  Status:  Discontinued     4.2 mL/hr  Intravenous Every 12 hours 06/01/15 0926 06/01/15 0926   06/01/15 0930  ceftazidime-avibactam (AVYCAZ) 0.94 g in dextrose 5 % 50 mL IVPB  Status:  Discontinued     0.94 g 25 mL/hr over 2 Hours Intravenous Every 24 hours 06/01/15 0918 06/01/15 0923   05/31/15 1000  ceftazidime-avibactam (AVYCAZ) 1.25 g in dextrose 5 % 50 mL IVPB     1.25 g 25 mL/hr over 2 Hours Intravenous Every 8 hours 05/31/15 0914     05/31/15 0900  ceftazidime-avibactam (AVYCAZ) 2.5 g in dextrose 5 % 50 mL IVPB  Status:  Discontinued     2.5 g 25 mL/hr over 2 Hours Intravenous 3 times per day 05/31/15 0851 05/31/15 0857   05/30/15 1800  colistimethate (COLYMYCIN) 300 mg in sodium chloride 0.9 % 100 mL IVPB     300 mg 208 mL/hr over 30 Minutes Intravenous  Once 05/30/15 1716 05/30/15 1847   05/30/15 0405  vancomycin (VANCOCIN) 2,000 mg in sodium chloride 0.9 % 500 mL IVPB  Status:  Discontinued     2,000 mg 250 mL/hr over 120 Minutes Intravenous Every 48 hours 05/28/15 0842 05/28/15 1334   05/28/15 1700  meropenem (MERREM) 2 g in sodium chloride 0.9 % 100 mL IVPB  Status:  Discontinued     2 g 200 mL/hr over 30 Minutes Intravenous Every 12 hours 05/28/15 0841 05/31/15 0851   05/28/15 0400  vancomycin (VANCOCIN) 1,750 mg in sodium chloride 0.9 % 500 mL IVPB  Status:  Discontinued     1,750 mg 250 mL/hr over 120 Minutes Intravenous Every 24 hours 05/28/15 0330 05/28/15 0842   05/25/15 1700  meropenem (MERREM) 1 g in sodium chloride 0.9 % 100 mL IVPB  Status:  Discontinued     1 g 200 mL/hr over 30 Minutes Intravenous Every 12  hours 05/25/15 1255 05/28/15 0841   05/24/15 0945  meropenem (MERREM) 1 g in sodium chloride 0.9 % 100 mL IVPB  Status:  Discontinued     1 g 200 mL/hr over 30 Minutes Intravenous 3 times per day 05/24/15 0939 05/25/15 1255   05/23/15 2200  piperacillin-tazobactam (ZOSYN) IVPB 3.375 g  Status:  Discontinued     3.375 g 12.5 mL/hr over 240 Minutes Intravenous 3 times per day 05/23/15 1257 05/24/15 0939   05/23/15 1300  vancomycin (VANCOCIN) 2,500 mg in sodium chloride 0.9 % 500 mL IVPB     2,500 mg 250 mL/hr over 120 Minutes Intravenous  Once 05/23/15 1257 05/23/15 1656   05/23/15 1300  piperacillin-tazobactam (ZOSYN) IVPB 3.375 g     3.375 g 100 mL/hr over 30 Minutes Intravenous  Once 05/23/15 1257 05/23/15 1417      Medications: Scheduled Meds: . amiodarone  400 mg Oral BID  . antiseptic oral rinse  7 mL Mouth Rinse q12n4p  . ceftazidime avibactam (AVYCAZ) IVPB  1.25 g Intravenous Q8H  . chlorhexidine  15 mL Mouth Rinse BID  . feeding supplement (PRO-STAT SUGAR FREE 64)  30 mL Per Tube  TID  . insulin aspart  0-9 Units Subcutaneous 6 times per day  . levothyroxine  25 mcg Intravenous Daily  . Minocycline HCl infusion 100 mg  100 mg Intravenous Q12H  . pantoprazole (PROTONIX) IV  40 mg Intravenous Q24H  . silver sulfADIAZINE   Topical Daily   Continuous Infusions:   PRN Meds:.sodium chloride, fentaNYL (SUBLIMAZE) injection    Objective: Weight change: -230 lb (-104.327 kg)  Intake/Output Summary (Last 24 hours) at 06/01/15 1117 Last data filed at 06/01/15 0008  Gross per 24 hour  Intake    500 ml  Output    850 ml  Net   -350 ml   Blood pressure 140/101, pulse 135, temperature 97.5 F (36.4 C), temperature source Oral, resp. rate 25, height 5\' 6"  (1.676 m), weight 664 lb (301.188 kg), last menstrual period 05/28/2015, SpO2 100 %. Temp:  [96.7 F (35.9 C)-97.5 F (36.4 C)] 97.5 F (36.4 C) (04/14 0755) Pulse Rate:  [110-135] 135 (04/14 0755) Resp:  [14-25] 25 (04/14  0755) BP: (96-140)/(50-101) 140/101 mmHg (04/14 0755) SpO2:  [93 %-100 %] 100 % (04/14 0755) Weight:  [433 lb (196.408 kg)-664 lb (301.188 kg)] 664 lb (301.188 kg) (04/14 1000)  Physical Exam: General: Alert and awake, oriented x3,  HEENT: anicteric sclera EOMI, central line in place CVS tachy rate, normal r,  no murmur rubs or gallops Chest: clear to auscultation bilaterally, no wheezing, rales or rhonchi Abdomen: soft nontender, nondistended, normal bowel sounds, Extremities: lymphedema with ulceration, tenderness redness with drainage   Neuro: nonfocal  CBC: CBC Latest Ref Rng 06/01/2015 05/31/2015 05/29/2015  WBC 4.0 - 10.5 K/uL 26.7(H) 32.2(H) 39.3(H)  Hemoglobin 12.0 - 15.0 g/dL 10.0(L) 9.9(L) 10.2(L)  Hematocrit 36.0 - 46.0 % 30.6(L) 30.1(L) 30.4(L)  Platelets 150 - 400 K/uL 147(L) 130(L) 66(L)       BMET  Recent Labs  05/31/15 0530 06/01/15 0457  NA 139 137  K 4.1 4.4  CL 100* 99*  CO2 26 25  GLUCOSE 118* 97  BUN 104* 107*  CREATININE 5.12* 5.17*  CALCIUM 7.8* 8.0*     Liver Panel  No results for input(s): PROT, ALBUMIN, AST, ALT, ALKPHOS, BILITOT, BILIDIR, IBILI in the last 72 hours.     Sedimentation Rate No results for input(s): ESRSEDRATE in the last 72 hours. C-Reactive Protein No results for input(s): CRP in the last 72 hours.  Micro Results: Recent Results (from the past 720 hour(s))  MRSA PCR Screening     Status: None   Collection Time: 05/23/15  1:42 PM  Result Value Ref Range Status   MRSA by PCR NEGATIVE NEGATIVE Final    Comment:        The GeneXpert MRSA Assay (FDA approved for NASAL specimens only), is one component of a comprehensive MRSA colonization surveillance program. It is not intended to diagnose MRSA infection nor to guide or monitor treatment for MRSA infections.   Culture, blood (routine x 2)     Status: Abnormal   Collection Time: 05/23/15  2:53 PM  Result Value Ref Range Status   Specimen Description BLOOD  RIGHT ARM  Final   Special Requests IN PEDIATRIC BOTTLE  3CC  Final   Culture  Setup Time (A)  Final    GRAM NEGATIVE DIPLOCOCCI AEROBIC BOTTLE ONLY CRITICAL RESULT CALLED TO, READ BACK BY AND VERIFIED WITH: C WOFFORD,RN @0415  05/24/15 MKELLY CORRECTED RESULTS GRAM NEGATIVE RODS CORRECTED RESULTS CALLED TO: J. SMITH,RN AT [id number] ON [id number] BY Lucienne CapersS. YARBROUGH  Culture (A)  Final    ACINETOBACTER CALCOACETICUS/BAUMANNII COMPLEX SUSCEPTIBILITIES PERFORMED ON PREVIOUS CULTURE WITHIN THE LAST 5 DAYS.    Report Status 05/26/2015 FINAL  Final  Culture, blood (routine x 2)     Status: Abnormal   Collection Time: 05/23/15  3:05 PM  Result Value Ref Range Status   Specimen Description BLOOD LEFT ARM  Final   Special Requests IN PEDIATRIC BOTTLE 3CC  Final   Culture  Setup Time (A)  Final    GRAM NEGATIVE DIPLOCOCCI AEROBIC BOTTLE ONLY CRITICAL RESULT CALLED TO, READ BACK BY AND VERIFIED WITH: C WOFFORD,RN  05/24/15 MKELLY CORRECTED RESULTS GRAM NEGATIVE RODS CORRECTED RESULTS CALLED TO: J. SMITH,RN AT 1610 ON [id number] BY Lucienne Capers    Culture ACINETOBACTER CALCOACETICUS/BAUMANNII COMPLEX (A)  Final   Report Status 05/26/2015 FINAL  Final   Organism ID, Bacteria ACINETOBACTER CALCOACETICUS/BAUMANNII COMPLEX  Final      Susceptibility   Acinetobacter calcoaceticus/baumannii complex - MIC*    CEFTAZIDIME 8 SENSITIVE Sensitive     CEFTRIAXONE 16 INTERMEDIATE Intermediate     CIPROFLOXACIN >=4 RESISTANT Resistant     GENTAMICIN >=16 RESISTANT Resistant     IMIPENEM 0.5 SENSITIVE Sensitive     PIP/TAZO 16 SENSITIVE Sensitive     TRIMETH/SULFA >=320 RESISTANT Resistant     AMPICILLIN/SULBACTAM <=2 SENSITIVE Sensitive     * ACINETOBACTER CALCOACETICUS/BAUMANNII COMPLEX  Urine culture     Status: None   Collection Time: 05/23/15  6:04 PM  Result Value Ref Range Status   Specimen Description URINE, CATHETERIZED  Final   Special Requests NONE  Final   Culture NO GROWTH 1 DAY  Final   Report  Status 05/24/2015 FINAL  Final  Wound culture     Status: None   Collection Time: 05/23/15  6:04 PM  Result Value Ref Range Status   Specimen Description WOUND LEFT THIGH  Final   Special Requests NONE  Final   Gram Stain   Final    NO WBC SEEN NO SQUAMOUS EPITHELIAL CELLS SEEN NO ORGANISMS SEEN Performed at [facility]    Culture   Final    ABUNDANT ACINETOBACTER CALCOACETICUS/BAUMANNII COMPLEX Performed at [facility]    Report Status 05/26/2015 FINAL  Final   Organism ID, Bacteria ACINETOBACTER CALCOACETICUS/BAUMANNII COMPLEX  Final      Susceptibility   Acinetobacter calcoaceticus/baumannii complex - MIC*    AMPICILLIN >=32 RESISTANT Resistant     AMPICILLIN/SULBACTAM <=2 SENSITIVE Sensitive     CEFAZOLIN >=64 RESISTANT Resistant     CEFTAZIDIME 4 SENSITIVE Sensitive     CEFTRIAXONE 16 INTERMEDIATE Intermediate     CIPROFLOXACIN >=4 RESISTANT Resistant     GENTAMICIN >=16 RESISTANT Resistant     IMIPENEM 1 SENSITIVE Sensitive     PIP/TAZO 32 INTERMEDIATE Intermediate     TOBRAMYCIN 8 INTERMEDIATE Intermediate     TRIMETH/SULFA Value in next row Resistant      >=320 RESISTANT(NOTE)    * ABUNDANT ACINETOBACTER CALCOACETICUS/BAUMANNII COMPLEX  Culture, blood (routine x 2)     Status: Abnormal (Preliminary result)   Collection Time: 05/27/15 12:01 PM  Result Value Ref Range Status   Specimen Description BLOOD CENTRAL LINE  Final   Special Requests BOTTLES DRAWN AEROBIC AND ANAEROBIC 10CC  Final   Culture  Setup Time   Final    GRAM NEGATIVE COCCOBACILLI IN BOTH AEROBIC AND ANAEROBIC BOTTLES CRITICAL RESULT CALLED TO, READ BACK BY AND VERIFIED WITH: TO BSHEPARD(RN) BY TCLEVELAND 05/28/2015 AT 2:52AM CORRECTED  RESULTS CALLED TO: Mohammed Kindle AT 1610 05/28/15 BY L BENFIELD PREVIOUSLY REPORTED AS GRAM POSITIVE COCCI    Culture (A)  Final    ACINETOBACTER CALCOACETICUS/BAUMANNII COMPLEX MULTI-DRUG RESISTANT ORGANISM SENDING TO REFERENCE LAB FOR COLISITIN,  TETRACYCLINE AND AMIKACIN SUSCEPTIBILITIES    Report Status PENDING  Incomplete   Organism ID, Bacteria ACINETOBACTER CALCOACETICUS/BAUMANNII COMPLEX  Final      Susceptibility   Acinetobacter calcoaceticus/baumannii complex - MIC*    CEFTAZIDIME 16 INTERMEDIATE Intermediate     CEFTRIAXONE >=64 RESISTANT Resistant     CIPROFLOXACIN >=4 RESISTANT Resistant     GENTAMICIN >=16 RESISTANT Resistant     IMIPENEM >=16 RESISTANT Resistant     PIP/TAZO >=128 RESISTANT Resistant     TRIMETH/SULFA >=320 RESISTANT Resistant     AMPICILLIN/SULBACTAM 16 INTERMEDIATE Intermediate     * ACINETOBACTER CALCOACETICUS/BAUMANNII COMPLEX  Culture, blood (routine x 2)     Status: Abnormal   Collection Time: 05/27/15 12:07 PM  Result Value Ref Range Status   Specimen Description BLOOD RIGHT HAND  Final   Special Requests BOTTLES DRAWN AEROBIC ONLY 5CC  Final   Culture  Setup Time   Final    GRAM NEGATIVE COCCOBACILLI AEROBIC BOTTLE ONLY CRITICAL RESULT CALLED TO, READ BACK BY AND VERIFIED WITH: TO BSHEPARD (RN) BY TCLEVELAND AT 2:54AM CORRECTED RESULTS CALLED TO: L WILSON,RN AT 9604 05/28/15 BY L BENFIELD PREVIOUSLY REPORTED AS GRAM POSITIVE COCCI    Culture (A)  Final    ACINETOBACTER CALCOACETICUS/BAUMANNII COMPLEX SUSCEPTIBILITIES PERFORMED ON PREVIOUS CULTURE WITHIN THE LAST 5 DAYS.    Report Status 05/31/2015 FINAL  Final  Culture, blood (Routine X 2) w Reflex to ID Panel     Status: None (Preliminary result)   Collection Time: 05/29/15  9:43 AM  Result Value Ref Range Status   Specimen Description BLOOD LEFT ARM  Final   Special Requests IN PEDIATRIC BOTTLE  4CC  Final   Culture NO GROWTH 2 DAYS  Final   Report Status PENDING  Incomplete  Culture, blood (Routine X 2) w Reflex to ID Panel     Status: None (Preliminary result)   Collection Time: 05/29/15  9:50 AM  Result Value Ref Range Status   Specimen Description BLOOD LEFT ARM  Final   Special Requests IN PEDIATRIC BOTTLE  4CC  Final    Culture NO GROWTH 2 DAYS  Final   Report Status PENDING  Incomplete    Studies/Results: Dg Chest Port 1 View  05/31/2015  CLINICAL DATA:  Central line placement EXAM: PORTABLE CHEST 1 VIEW COMPARISON:  05/24/2011 FINDINGS: New central line from a left jugular approach. Tip appears to be in the mid right atrium. Right jugular catheter in the SVC unchanged. No pneumothorax Cardiac enlargement. Improvement in vascular congestion. Improvement in bibasilar atelectasis. IMPRESSION: Left jugular central venous catheter tip in the mid right stroke atrium. No pneumothorax Improvement in vascular congestion. Improvement in bibasilar atelectasis. Electronically Signed   By: Marlan Palau M.D.   On: 05/31/2015 15:37      Assessment/Plan:  INTERVAL HISTORY:   05/31/15: cultures persistently + for acinetobacter which is R to Merrem as well 06/01/15:  IJ removed and new central line placed, IV minocycline arrived  Principal Problem:   MDR Acinetobacter baumannii infection Active Problems:   Morbid obesity (HCC)   Lymphedema of both lower extremities   Cellulitis and abscess of trunk   Sepsis (HCC)   Septic shock (HCC)   Acute respiratory failure (HCC)   Palliative care  encounter   Goals of care, counseling/discussion   Acute renal failure with tubular necrosis (HCC)   Abscess   Encounter for nasogastric (NG) tube placement   Encounter for hospice care discussion    Michele Weber is a 45 y.o. female with  MDR acinetobacter bacteremia. She still has central line from 05/22/15  #1 MDR Acinetobacter  Greatly appreciate  CCM  Help with her central lines  --continue AVYCAZ, and starting MINOCYCLINE IV --try to acquire AVYCAZ S testing at [facility] --followup colistin,amikcain, tetracyclin polymyxin B being done through our lab --repeat blood cultures tomorrow after central line out   Fortunately her most recent cultures even before IJ removal were negative but organism could be slow grower  Dr.  Orvan Falconer will be available for questions over the weekend.    LOS: 9 days   Acey Lav 06/01/2015, 11:17 AM

## 2015-06-02 LAB — CBC
HCT: 29.6 % — ABNORMAL LOW (ref 36.0–46.0)
HEMOGLOBIN: 9.5 g/dL — AB (ref 12.0–15.0)
MCH: 29.5 pg (ref 26.0–34.0)
MCHC: 32.1 g/dL (ref 30.0–36.0)
MCV: 91.9 fL (ref 78.0–100.0)
PLATELETS: 150 10*3/uL (ref 150–400)
RBC: 3.22 MIL/uL — ABNORMAL LOW (ref 3.87–5.11)
RDW: 15.9 % — ABNORMAL HIGH (ref 11.5–15.5)
WBC: 22.9 10*3/uL — ABNORMAL HIGH (ref 4.0–10.5)

## 2015-06-02 LAB — BASIC METABOLIC PANEL
Anion gap: 11 (ref 5–15)
BUN: 108 mg/dL — AB (ref 6–20)
CALCIUM: 8.2 mg/dL — AB (ref 8.9–10.3)
CHLORIDE: 101 mmol/L (ref 101–111)
CO2: 25 mmol/L (ref 22–32)
CREATININE: 4.84 mg/dL — AB (ref 0.44–1.00)
GFR, EST AFRICAN AMERICAN: 12 mL/min — AB (ref >60)
GFR, EST NON AFRICAN AMERICAN: 10 mL/min — AB (ref >60)
Glucose, Bld: 107 mg/dL — ABNORMAL HIGH (ref 65–99)
Potassium: 4.7 mmol/L (ref 3.5–5.1)
SODIUM: 137 mmol/L (ref 135–145)

## 2015-06-02 LAB — GLUCOSE, CAPILLARY
GLUCOSE-CAPILLARY: 110 mg/dL — AB (ref 65–99)
GLUCOSE-CAPILLARY: 74 mg/dL (ref 65–99)
GLUCOSE-CAPILLARY: 99 mg/dL (ref 65–99)
Glucose-Capillary: 92 mg/dL (ref 65–99)
Glucose-Capillary: 104 mg/dL — ABNORMAL HIGH (ref 65–99)
Glucose-Capillary: 127 mg/dL — ABNORMAL HIGH (ref 65–99)

## 2015-06-02 LAB — LACTIC ACID, PLASMA: LACTIC ACID, VENOUS: 1.3 mmol/L (ref 0.5–2.0)

## 2015-06-02 MED ORDER — METOPROLOL TARTRATE 12.5 MG HALF TABLET: 12.5000 mg | ORAL_TABLET | Freq: Two times a day (BID) | ORAL | Status: DC

## 2015-06-02 MED ORDER — SODIUM CHLORIDE 0.9 % IV SOLN
Freq: Two times a day (BID) | INTRAVENOUS | Status: DC
  Administered 2015-06-02 – 2015-06-03 (×3): via INTRAVENOUS
  Filled 2015-06-02 (×9): qty 100

## 2015-06-02 MED ORDER — SODIUM CHLORIDE 0.9 % IV SOLN
INTRAVENOUS | Status: AC
  Administered 2015-06-02: 1000 mL via INTRAVENOUS

## 2015-06-02 NOTE — Evaluation (Signed)
Physical Therapy Evaluation Patient Details Name: Michele Weber MRN: [id number] DOB: 14-Jul-1970 Today's Date: 06/02/2015   History of Present Illness  Patient is a 45 yo female admitted 05/23/15 with hypotension, sepsis, cellulitis/lymphedema BLE's, acute resp failure, Afib with RVR, acute renal failure.   PMH:  morbid obesity, afib with RVR, cellulitis, CHF  Clinical Impression  Patient presents with problems listed below.  Patient severely limited due to morbid obesity.  Will benefit from exercise program to initiate strengthening program.  [place] if available due to patient's young age.  If not, will need SNF at discharge for continued therapy.    Follow Up Recommendations SNF;Supervision/Assistance - 24 hour    Equipment Recommendations  None recommended by PT    Recommendations for Other Services       Precautions / Restrictions Precautions Precautions: Fall Restrictions Weight Bearing Restrictions: No      Mobility  Bed Mobility Overal bed mobility: Needs Assistance (Per RN, 4-5 staff to roll patient for bedpan)             General bed mobility comments: Patient unable.  Requires 4-5 staff to roll patient.  Transfers                    Ambulation/Gait                Stairs            Wheelchair Mobility    Modified Rankin (Stroke Patients Only)       Balance                                             Pertinent Vitals/Pain Pain Assessment: Faces Faces Pain Scale: Hurts even more Pain Location: LLE Pain Descriptors / Indicators: Aching;Sore Pain Intervention(s): Monitored during session;Repositioned;Patient requesting pain meds-RN notified    Home Living Family/patient expects to be discharged to:: Skilled nursing facility                 Additional Comments: Patient admitted from [facility].  Reports trying to move to SNF in [facility].    Prior Function  Level of Independence: Needs assistance   Gait / Transfers Assistance Needed: Per patient, bedridden for more than a year.  ADL's / Homemaking Assistance Needed: Able to feed self.  Assist with all other ADL's        Hand Dominance        Extremity/Trunk Assessment   Upper Extremity Assessment: Generalized weakness (Able to lift arms above head)           Lower Extremity Assessment: RLE deficits/detail;LLE deficits/detail RLE Deficits / Details: Strength grossly 2-/5 except ankle movement 3/5.  Wounds noted on LE. LLE Deficits / Details: Strength grossly 2-/5 except ankle movement 3/5.  Wounds noted on LE.     Communication   Communication: No difficulties  Cognition Arousal/Alertness: Awake/alert Behavior During Therapy: WFL for tasks assessed/performed;Flat affect Overall Cognitive Status: Within Functional Limits for tasks assessed                      General Comments General comments (skin integrity, edema, etc.): Wounds on BLE's    Exercises General Exercises - Upper Extremity Shoulder Flexion: AROM;Both;5 reps;Supine Shoulder Horizontal ABduction: AROM;Both;5 reps;Supine General Exercises - Lower Extremity Ankle Circles/Pumps: AROM;Both;10 reps;Supine Quad Sets: AROM;Both;5 reps;Supine  Assessment/Plan    PT Assessment Patient needs continued PT services;All further PT needs can be met in the next venue of care  PT Diagnosis Generalized weakness;Acute pain   PT Problem List Decreased strength;Decreased range of motion;Decreased activity tolerance;Decreased mobility;Obesity;Pain;Decreased skin integrity  PT Treatment Interventions Therapeutic exercise;Patient/family education   PT Goals (Current goals can be found in the Care Plan section) Acute Rehab PT Goals Patient Stated Goal: To get stronger PT Goal Formulation: With patient Time For Goal Achievement: 06/09/15 Potential to Achieve Goals: Good    Frequency Min 2X/week   Barriers to  discharge        Co-evaluation               End of Session   Activity Tolerance: Patient tolerated treatment well;Patient limited by pain Patient left: in bed;with call bell/phone within reach Nurse Communication: Patient requests pain meds         Time: 6811-5726 PT Time Calculation (min) (ACUTE ONLY): 17 min   Charges:   PT Evaluation $PT Eval Moderate Complexity: 1 Procedure     PT G CodesDespina Pole 06-28-2015, 9:24 PM Carita Pian. Sanjuana Kava, [place] Pager [phone]

## 2015-06-02 NOTE — Progress Notes (Signed)
[facility] PROGRESS NOTE  Michele Weber ZOX:[id number] DOB: 1970/11/29 DOA: 05/23/2015 PCP: Angela Cox, MD  HPI/Brief narrative Morbidly obese 850lb pt who presented initially with AMS and hypotension with presenting sepsis with GNR bacteremia.  Assessment/Plan: 1. Gm neg bacteremia (acinetobacter) with sepsis present on admission 1. WBC today at 22k with continued tachycardia, albeit improved 2. Appreciate input by ID. Had been on meropenem. Now on Avycaz as of 4/13 3. Central line successfully replaced on 4/13 by PCCM 2. Morbid obesity 1. Stable thus far 3. Hypoxemic hypercapnic resp failure secondary to morbid obesity 1. Per Pulmonary, CPAP QHS and can make PRN BiPAP. No intubation 4. Afib with RVR 1. Tachycardic. Suspect worsened by active sepsis 2. On amiodarone 400mg  bid 3. Gave trial of metoprolol, however sbp down to below 100 this AM, thus d/c'd 5. Acute on CKD 1. Difficult to assess volume status given morbid obesity 2. Cr now trending down 3. Try to cont to avoid nephrotoxic agents as possible 4. Discussed case with Nephrology. Rec to continue gentle hydration over the next 12-24hrs and re-assess 6. Altered mental status 1. Seems improved 2. Pt axo x3 today 7. Bulla ruptures 1. Noted in multiple areas under the patient 2. Do not appear infected at present with good granulation seen 3. Appreciate input by WOC. Recs noted 8. DVT prophylaxis 1. SCD's for prophylaxis  Code Status: No intubation or chest compressions Family Communication: Pt in room Disposition Plan: Uncertain at this time, remains in stepdown   Consultants:  ID  Critical Care  Palliative Care  Discussed case with Nephrology over phone  Procedures:    Antibiotics: Anti-infectives    Start     Dose/Rate Route Frequency Ordered Stop   06/02/15 2100  sodium chloride 0.9 % 100 mL with Minocycline HCl 100 mg infusion     100 mL/hr  Intravenous Every 12 hours 06/02/15 1307  06/06/15 0859   06/01/15 2200  Minocycline HCl infusion 100 mg  Status:  Discontinued     100 mg 100 mL/hr over 60 Minutes Intravenous Every 12 hours 06/01/15 1546 06/02/15 1303   06/01/15 1000  Minocycline HCl infusion 100 mg  Status:  Discontinued     100 mg 4.2 mL/hr over 60 Minutes Intravenous Every 12 hours 06/01/15 0911 06/01/15 1546   06/01/15 1000  sodium chloride 0.9 % 1,000 mL with Minocycline HCl 100 mg infusion  Status:  Discontinued     4.2 mL/hr  Intravenous Every 12 hours 06/01/15 0926 06/01/15 0926   06/01/15 0930  ceftazidime-avibactam (AVYCAZ) 0.94 g in dextrose 5 % 50 mL IVPB  Status:  Discontinued     0.94 g 25 mL/hr over 2 Hours Intravenous Every 24 hours 06/01/15 0918 06/01/15 0923   05/31/15 1000  ceftazidime-avibactam (AVYCAZ) 1.25 g in dextrose 5 % 50 mL IVPB     1.25 g 25 mL/hr over 2 Hours Intravenous Every 8 hours 05/31/15 0914     05/31/15 0900  ceftazidime-avibactam (AVYCAZ) 2.5 g in dextrose 5 % 50 mL IVPB  Status:  Discontinued     2.5 g 25 mL/hr over 2 Hours Intravenous 3 times per day 05/31/15 0851 05/31/15 0857   05/30/15 1800  colistimethate (COLYMYCIN) 300 mg in sodium chloride 0.9 % 100 mL IVPB     300 mg 208 mL/hr over 30 Minutes Intravenous  Once 05/30/15 1716 05/30/15 1847   05/30/15 0405  vancomycin (VANCOCIN) 2,000 mg in sodium chloride 0.9 % 500 mL IVPB  Status:  Discontinued  2,000 mg 250 mL/hr over 120 Minutes Intravenous Every 48 hours 05/28/15 0842 05/28/15 1334   05/28/15 1700  meropenem (MERREM) 2 g in sodium chloride 0.9 % 100 mL IVPB  Status:  Discontinued     2 g 200 mL/hr over 30 Minutes Intravenous Every 12 hours 05/28/15 0841 05/31/15 0851   05/28/15 0400  vancomycin (VANCOCIN) 1,750 mg in sodium chloride 0.9 % 500 mL IVPB  Status:  Discontinued     1,750 mg 250 mL/hr over 120 Minutes Intravenous Every 24 hours 05/28/15 0330 05/28/15 0842   05/25/15 1700  meropenem (MERREM) 1 g in sodium chloride 0.9 % 100 mL IVPB  Status:   Discontinued     1 g 200 mL/hr over 30 Minutes Intravenous Every 12 hours 05/25/15 1255 05/28/15 0841   05/24/15 0945  meropenem (MERREM) 1 g in sodium chloride 0.9 % 100 mL IVPB  Status:  Discontinued     1 g 200 mL/hr over 30 Minutes Intravenous 3 times per day 05/24/15 0939 05/25/15 1255   05/23/15 2200  piperacillin-tazobactam (ZOSYN) IVPB 3.375 g  Status:  Discontinued     3.375 g 12.5 mL/hr over 240 Minutes Intravenous 3 times per day 05/23/15 1257 05/24/15 0939   05/23/15 1300  vancomycin (VANCOCIN) 2,500 mg in sodium chloride 0.9 % 500 mL IVPB     2,500 mg 250 mL/hr over 120 Minutes Intravenous  Once 05/23/15 1257 05/23/15 1656   05/23/15 1300  piperacillin-tazobactam (ZOSYN) IVPB 3.375 g     3.375 g 100 mL/hr over 30 Minutes Intravenous  Once 05/23/15 1257 05/23/15 1417      HPI/Subjective: Reports feeling better this AM  Objective: Filed Vitals:   06/02/15 0757 06/02/15 1000 06/02/15 1056 06/02/15 1337  BP: 90/62 110/58  94/61  Pulse: 103 105 109 112  Temp: 99 F (37.2 C)   98.5 F (36.9 C)  TempSrc: Axillary   Axillary  Resp: 14 15 24 18   Height:      Weight:      SpO2: 100% 99% 100% 100%    Intake/Output Summary (Last 24 hours) at 06/02/15 1510 Last data filed at 06/02/15 1000  Gross per 24 hour  Intake    280 ml  Output   2450 ml  Net  -2170 ml   Filed Weights   05/31/15 0500 06/01/15 0436 06/01/15 1000  Weight: 300.735 kg (663 lb) 196.408 kg (433 lb) 301.188 kg (664 lb)    Exam:   General:  Asleep, easily arousable, in nad  Cardiovascular: tachycardic, s1, s2  Respiratory: normal resp effort, no wheezing  Abdomen: morbidly obese, nondistended, pos BS  Musculoskeletal: perfused, no cyanosis, no clubbing  Data Reviewed: Basic Metabolic Panel:  Recent Labs Lab 05/28/15 0410 05/29/15 0452 05/31/15 0530 06/01/15 0457 06/02/15 0504  NA 138 139 139 137 137  K 4.8 4.6 4.1 4.4 4.7  CL 100* 100* 100* 99* 101  CO2 24 25 26 25 25   GLUCOSE  162* 149* 118* 97 107*  BUN 76* 83* 104* 107* 108*  CREATININE 5.02* 5.02* 5.12* 5.17* 4.84*  CALCIUM 7.2* 7.3* 7.8* 8.0* 8.2*  MG 2.0  --   --   --   --    Liver Function Tests:  Recent Labs Lab 05/29/15 0452  AST 18  ALT 33  ALKPHOS 86  BILITOT 0.7  PROT 5.6*  ALBUMIN 1.6*   No results for input(s): LIPASE, AMYLASE in the last 168 hours. No results for input(s): AMMONIA in the last  168 hours. CBC:  Recent Labs Lab 05/28/15 0410 05/29/15 0452 05/31/15 0530 06/01/15 0457 06/02/15 0504  WBC 39.1* 39.3* 32.2* 26.7* 22.9*  HGB 9.7* 10.2* 9.9* 10.0* 9.5*  HCT 29.6* 30.4* 30.1* 30.6* 29.6*  MCV 91.4 91.8 91.5 91.6 91.9  PLT 36* 66* 130* 147* 150   Cardiac Enzymes: No results for input(s): CKTOTAL, CKMB, CKMBINDEX, TROPONINI in the last 168 hours. BNP (last 3 results) No results for input(s): BNP in the last 8760 hours.  ProBNP (last 3 results) No results for input(s): PROBNP in the last 8760 hours.  CBG:  Recent Labs Lab 06/01/15 2005 06/02/15 0049 06/02/15 0414 06/02/15 0755 06/02/15 1335  GLUCAP 104* 110* 99 74 92    Recent Results (from the past 240 hour(s))  Urine culture     Status: None   Collection Time: 05/23/15  6:04 PM  Result Value Ref Range Status   Specimen Description URINE, CATHETERIZED  Final   Special Requests NONE  Final   Culture NO GROWTH 1 DAY  Final   Report Status 05/24/2015 FINAL  Final  Wound culture     Status: None   Collection Time: 05/23/15  6:04 PM  Result Value Ref Range Status   Specimen Description WOUND LEFT THIGH  Final   Special Requests NONE  Final   Gram Stain   Final    NO WBC SEEN NO SQUAMOUS EPITHELIAL CELLS SEEN NO ORGANISMS SEEN Performed at [facility]    Culture   Final    ABUNDANT ACINETOBACTER CALCOACETICUS/BAUMANNII COMPLEX Performed at [facility]    Report Status 05/26/2015 FINAL  Final   Organism ID, Bacteria ACINETOBACTER CALCOACETICUS/BAUMANNII COMPLEX  Final       Susceptibility   Acinetobacter calcoaceticus/baumannii complex - MIC*    AMPICILLIN >=32 RESISTANT Resistant     AMPICILLIN/SULBACTAM <=2 SENSITIVE Sensitive     CEFAZOLIN >=64 RESISTANT Resistant     CEFTAZIDIME 4 SENSITIVE Sensitive     CEFTRIAXONE 16 INTERMEDIATE Intermediate     CIPROFLOXACIN >=4 RESISTANT Resistant     GENTAMICIN >=16 RESISTANT Resistant     IMIPENEM 1 SENSITIVE Sensitive     PIP/TAZO 32 INTERMEDIATE Intermediate     TOBRAMYCIN 8 INTERMEDIATE Intermediate     TRIMETH/SULFA Value in next row Resistant      >=320 RESISTANT(NOTE)    * ABUNDANT ACINETOBACTER CALCOACETICUS/BAUMANNII COMPLEX  Culture, blood (routine x 2)     Status: Abnormal (Preliminary result)   Collection Time: 05/27/15 12:01 PM  Result Value Ref Range Status   Specimen Description BLOOD CENTRAL LINE  Final   Special Requests BOTTLES DRAWN AEROBIC AND ANAEROBIC 10CC  Final   Culture  Setup Time   Final    GRAM NEGATIVE COCCOBACILLI IN BOTH AEROBIC AND ANAEROBIC BOTTLES CRITICAL RESULT CALLED TO, READ BACK BY AND VERIFIED WITH: TO BSHEPARD(RN) BY TCLEVELAND 05/28/2015 AT 2:52AM CORRECTED RESULTS CALLED TO: L WILSON,RN AT 1610 05/28/15 BY L BENFIELD PREVIOUSLY REPORTED AS GRAM POSITIVE COCCI    Culture (A)  Final    ACINETOBACTER CALCOACETICUS/BAUMANNII COMPLEX MULTI-DRUG RESISTANT ORGANISM SENDING TO REFERENCE LAB FOR COLISITIN, TETRACYCLINE AND AMIKACIN SUSCEPTIBILITIES    Report Status PENDING  Incomplete   Organism ID, Bacteria ACINETOBACTER CALCOACETICUS/BAUMANNII COMPLEX  Final      Susceptibility   Acinetobacter calcoaceticus/baumannii complex - MIC*    CEFTAZIDIME 16 INTERMEDIATE Intermediate     CEFTRIAXONE >=64 RESISTANT Resistant     CIPROFLOXACIN >=4 RESISTANT Resistant     GENTAMICIN >=16 RESISTANT Resistant  IMIPENEM >=16 RESISTANT Resistant     PIP/TAZO >=128 RESISTANT Resistant     TRIMETH/SULFA >=320 RESISTANT Resistant     AMPICILLIN/SULBACTAM 16 INTERMEDIATE Intermediate      * ACINETOBACTER CALCOACETICUS/BAUMANNII COMPLEX  Culture, blood (routine x 2)     Status: Abnormal   Collection Time: 05/27/15 12:07 PM  Result Value Ref Range Status   Specimen Description BLOOD RIGHT HAND  Final   Special Requests BOTTLES DRAWN AEROBIC ONLY 5CC  Final   Culture  Setup Time   Final    GRAM NEGATIVE COCCOBACILLI AEROBIC BOTTLE ONLY CRITICAL RESULT CALLED TO, READ BACK BY AND VERIFIED WITH: TO BSHEPARD (RN) BY TCLEVELAND AT 2:54AM CORRECTED RESULTS CALLED TO: L WILSON,RN AT 1610 05/28/15 BY L BENFIELD PREVIOUSLY REPORTED AS GRAM POSITIVE COCCI    Culture (A)  Final    ACINETOBACTER CALCOACETICUS/BAUMANNII COMPLEX SUSCEPTIBILITIES PERFORMED ON PREVIOUS CULTURE WITHIN THE LAST 5 DAYS.    Report Status 05/31/2015 FINAL  Final  Culture, blood (Routine X 2) w Reflex to ID Panel     Status: None (Preliminary result)   Collection Time: 05/29/15  9:43 AM  Result Value Ref Range Status   Specimen Description BLOOD LEFT ARM  Final   Special Requests IN PEDIATRIC BOTTLE  4CC  Final   Culture NO GROWTH 3 DAYS  Final   Report Status PENDING  Incomplete  Culture, blood (Routine X 2) w Reflex to ID Panel     Status: None (Preliminary result)   Collection Time: 05/29/15  9:50 AM  Result Value Ref Range Status   Specimen Description BLOOD LEFT ARM  Final   Special Requests IN PEDIATRIC BOTTLE  4CC  Final   Culture NO GROWTH 3 DAYS  Final   Report Status PENDING  Incomplete     Studies: Dg Chest Port 1 View  05/31/2015  CLINICAL DATA:  Central line placement EXAM: PORTABLE CHEST 1 VIEW COMPARISON:  05/24/2011 FINDINGS: New central line from a left jugular approach. Tip appears to be in the mid right atrium. Right jugular catheter in the SVC unchanged. No pneumothorax Cardiac enlargement. Improvement in vascular congestion. Improvement in bibasilar atelectasis. IMPRESSION: Left jugular central venous catheter tip in the mid right stroke atrium. No pneumothorax Improvement in  vascular congestion. Improvement in bibasilar atelectasis. Electronically Signed   By: Marlan Palau M.D.   On: 05/31/2015 15:37    Scheduled Meds: . amiodarone  400 mg Oral BID  . antiseptic oral rinse  7 mL Mouth Rinse q12n4p  . ceftazidime avibactam (AVYCAZ) IVPB  1.25 g Intravenous Q8H  . chlorhexidine  15 mL Mouth Rinse BID  . feeding supplement (PRO-STAT SUGAR FREE 64)  30 mL Per Tube TID  . insulin aspart  0-9 Units Subcutaneous 6 times per day  . levothyroxine  50 mcg Oral QAC breakfast  . pantoprazole (PROTONIX) IV  40 mg Intravenous Q24H  . silver sulfADIAZINE   Topical Daily  . sodium chloride 0.9 % 100 mL with Minocycline HCl 100 mg infusion   Intravenous Q12H   Continuous Infusions: . sodium chloride      Principal Problem:   MDR Acinetobacter baumannii infection Active Problems:   Morbid obesity (HCC)   Lymphedema of both lower extremities   Cellulitis and abscess of trunk   Sepsis (HCC)   Septic shock (HCC)   Acute respiratory failure (HCC)   Palliative care encounter   Goals of care, counseling/discussion   Acute renal failure with tubular necrosis (HCC)   Abscess  Encounter for nasogastric (NG) tube placement   Encounter for hospice care discussion   Encounter for central line placement    CHIU, STEPHEN K  [facility] Pager [phone]. If 7PM-7AM, please contact night-coverage at [email or web address], password [facility] 06/02/2015, 3:10 PM  LOS: 10 days

## 2015-06-03 LAB — CULTURE, BLOOD (ROUTINE X 2)
CULTURE: NO GROWTH
Culture: NO GROWTH

## 2015-06-03 LAB — BASIC METABOLIC PANEL
Anion gap: 12 (ref 5–15)
BUN: 115 mg/dL — AB (ref 6–20)
CALCIUM: 8.5 mg/dL — AB (ref 8.9–10.3)
CHLORIDE: 100 mmol/L — AB (ref 101–111)
CO2: 23 mmol/L (ref 22–32)
CREATININE: 4.99 mg/dL — AB (ref 0.44–1.00)
GFR calc Af Amer: 11 mL/min — ABNORMAL LOW (ref >60)
GFR, EST NON AFRICAN AMERICAN: 10 mL/min — AB (ref >60)
Glucose, Bld: 111 mg/dL — ABNORMAL HIGH (ref 65–99)
Potassium: 4.6 mmol/L (ref 3.5–5.1)
SODIUM: 135 mmol/L (ref 135–145)

## 2015-06-03 LAB — CBC
HCT: 29 % — ABNORMAL LOW (ref 36.0–46.0)
Hemoglobin: 9.5 g/dL — ABNORMAL LOW (ref 12.0–15.0)
MCH: 30.6 pg (ref 26.0–34.0)
MCHC: 32.8 g/dL (ref 30.0–36.0)
MCV: 93.5 fL (ref 78.0–100.0)
PLATELETS: 175 10*3/uL (ref 150–400)
RBC: 3.1 MIL/uL — AB (ref 3.87–5.11)
RDW: 15.8 % — AB (ref 11.5–15.5)
WBC: 20.9 10*3/uL — AB (ref 4.0–10.5)

## 2015-06-03 LAB — CK: CK TOTAL: 111 U/L (ref 38–234)

## 2015-06-03 LAB — GLUCOSE, CAPILLARY
GLUCOSE-CAPILLARY: 90 mg/dL (ref 65–99)
GLUCOSE-CAPILLARY: 92 mg/dL (ref 65–99)
Glucose-Capillary: 121 mg/dL — ABNORMAL HIGH (ref 65–99)
Glucose-Capillary: 104 mg/dL — ABNORMAL HIGH (ref 65–99)
Glucose-Capillary: 108 mg/dL — ABNORMAL HIGH (ref 65–99)
Glucose-Capillary: 105 mg/dL — ABNORMAL HIGH (ref 65–99)

## 2015-06-03 MED ORDER — SODIUM CHLORIDE 0.9 % IV SOLN
INTRAVENOUS | Status: AC
  Administered 2015-06-03: 18:00:00 via INTRAVENOUS

## 2015-06-03 NOTE — Progress Notes (Signed)
Physical Therapy Treatment Patient Details Name: Michele Weber MRN: [id number] DOB: [birth year] Today's Date: 06/03/2015    History of Present Illness Patient is a 45 yo female admitted 05/23/15 with hypotension, sepsis, cellulitis/lymphedema BLE's, acute resp failure, Afib with RVR, acute renal failure.   PMH:  morbid obesity, afib with RVR, cellulitis, CHF    PT Comments    Assisted nursing with rolling patient to both sides.  Patient able to assist minimally with UE's.  Required +4 total assist.  Patient was provided with handout for strengthening exercise program.  Reviewed exercises, and patient to complete 2x/day.  Will provide theraband for UE exercises.  Goals achieved - PT will sign off.  Continue to recommend Bariatric Program vs. SNF for continued therapy.   Follow Up Recommendations  SNF;Supervision/Assistance - 24 hour     Equipment Recommendations  None recommended by PT    Recommendations for Other Services       Precautions / Restrictions Precautions Precautions: Fall Restrictions Weight Bearing Restrictions: No    Mobility  Bed Mobility Overal bed mobility: Needs Assistance (+4 for rolling) Bed Mobility: Rolling Rolling: Total assist (+4 for physical assist)         General bed mobility comments: Verbal cues to have patient reach for bedrail to assist with rolling.  Patient able to assist with UE's minimally, but not with LE's.  Required +4 assist to roll patient to both sides to be cleaned.  Transfers                 General transfer comment: Unable  Ambulation/Gait                 Stairs            Wheelchair Mobility    Modified Rankin (Stroke Patients Only)       Balance                                    Cognition Arousal/Alertness: Awake/alert Behavior During Therapy: WFL for tasks assessed/performed;Flat affect Overall Cognitive Status: Within Functional Limits for tasks assessed                       Exercises General Exercises - Upper Extremity Shoulder Flexion: AROM;Both;5 reps;Supine Shoulder Horizontal ABduction: AROM;Both;5 reps;Supine Shoulder Horizontal ADduction: AROM;Both;5 reps;Supine Elbow Flexion: AROM;Both;Supine;5 reps Elbow Extension: AROM;Both;5 reps;Supine General Exercises - Lower Extremity Ankle Circles/Pumps: AROM;Both;10 reps;Supine Quad Sets: AROM;Both;5 reps;Supine Gluteal Sets: AROM;Both;5 reps;Supine Heel Slides: Both;5 reps;Supine (Isometric) Hip ABduction/ADduction: Both;5 reps;Supine (Isometric)    General Comments General comments (skin integrity, edema, etc.): Wounds with copious drainage BLE's      Pertinent Vitals/Pain Pain Assessment: 0-10 Pain Score: 9  Pain Location: LE's and abdomen Pain Descriptors / Indicators: Sore;Tender Pain Intervention(s): Premedicated before session;Monitored during session;Repositioned    Home Living                      Prior Function            PT Goals (current goals can now be found in the care plan section) Acute Rehab PT Goals PT Goal Formulation: All assessment and education complete, DC therapy Progress towards PT goals: Goals met/education completed, patient discharged from PT    Frequency  Min 2X/week    PT Plan Current plan remains appropriate    Co-evaluation  End of Session   Activity Tolerance: Patient tolerated treatment well;Patient limited by pain Patient left: in bed;with call bell/phone within reach;with nursing/sitter in room     Time: 1726-1801 PT Time Calculation (min) (ACUTE ONLY): 35 min  Charges:  $Therapeutic Exercise: 8-22 mins $Therapeutic Activity: 8-22 mins                    G Codes:      Despina Pole 06/12/2015, 6:29 PM Carita Pian. Sanjuana Kava, [place] Pager [phone]

## 2015-06-03 NOTE — Progress Notes (Signed)
Pharmacy Antibiotic Note  Michele MowersSharon Hemmingway is a 45 y.o. female admitted on 05/23/2015 with sepsis due to MDR acinetobacter bacteremia.  Pharmacy has been consulted for ceftazidime-avibactam dosing.   Day #4 of Avycaz for MDR Acinetobacter bacteremia and cellulitis. ID on board since cx's continued to remain positive and changed to Avycaz for continued positive cx's and creeping MICs. Has extensive LLE wounds that significantly worsened.  Afebrile, WBC continues to trend down to 20.9. Silvadene. Last two cx's have remained ngtd. Holding off on further colistin to preserve renal function and awaiting susceptibilities to other abx.  Plan: Continue Ceftazidime-avibactam 1.25 gm IV  q8h (No obesity data) Continue minocycline 100mg  IV Q12 per MD Monitor clinical picture, renal function F/U blood cx, colistin, amikacin, tetracycline, and Avycaz susceptibilities, LOT  Height: 5\' 6"  (167.6 cm) Weight: (!) 664 lb (301.188 kg) IBW/kg (Calculated) : 59.3  Temp (24hrs), Avg:98.1 F (36.7 C), Min:97.7 F (36.5 C), Max:98.5 F (36.9 C)   Recent Labs Lab 05/29/15 0452 05/31/15 0530 06/01/15 0457 06/02/15 0504 06/02/15 0943 06/03/15 0659  WBC 39.3* 32.2* 26.7* 22.9*  --  20.9*  CREATININE 5.02* 5.12* 5.17* 4.84*  --  4.99*  LATICACIDVEN  --   --   --   --  1.3  --     Estimated Creatinine Clearance: 35.1 mL/min (by C-G formula based on Cr of 4.99).    No Known Allergies  Antimicrobials this admission: Levaquin PTA Vanc 4/4 >> 4/7 Zosyn 4/4 >> 4/6 Meropenem 4/6 >> 4/14 Colistin x 1 4/12 Ceftazidime-avibactam 4/13 >> Minocycline 4/14 >>  Dose adjustments this admission: 4/6 VR = 34  Microbiology results: Blood [facility] Salvia([place]) - Acinetobacter (sensitive Zosyn, Merrem)  Abscess cx ([place]) - negative  4/5 BCx x2 - Acinetobacter (sensitive Unasyn, Primaxin, Zosyn)  4/5 UCx - negative  4/5 left thigh wound - Acinetobacter (sensitive Unasyn, Fortaz, Primaxin) 4/9 BCx: Acinetobacter - R to  ceftriaxone, cipro, gent, imipenem, zosyn, bactrim; I to ceftazidime and unasyn  4/11 BCx: NGTD 4/14 Blood cx: sent  Thank you for allowing pharmacy to be a part of this patient's care.  Enzo BiNathan Ebonique Hallstrom, PharmD, BCPS Clinical Pharmacist Pager [phone] 06/03/2015 10:30 AM

## 2015-06-03 NOTE — Progress Notes (Signed)
[facility] PROGRESS NOTE  Marcie MowersSharon Yahr ZOX:[id number] DOB: 1970/07/04 DOA: 05/23/2015 PCP: Angela Coxasanayaka, Gayani Y, MD  HPI/Brief narrative Morbidly obese 850lb pt who presented initially with AMS and hypotension with presenting sepsis with GNR bacteremia.  Assessment/Plan: 1. Gm neg bacteremia (acinetobacter) with sepsis present on admission 1. WBC today at [facility]20k with continued tachycardia, stable 2. Appreciate input by ID. Had been on meropenem. Now on Avycaz as of 4/13 3. Central line successfully replaced on 4/13 by PCCM 2. Morbid obesity 1. Stable thus far 3. Hypoxemic hypercapnic resp failure secondary to morbid obesity 1. Per Pulmonary, CPAP QHS and can make PRN BiPAP. No intubation 4. Afib with RVR 1. Tachycardic. Suspect worsened by active sepsis 2. On amiodarone 400mg  bid 3. Gave trial of metoprolol, however sbp down to below 100 this AM, thus d/c'd 5. Acute on CKD 1. Difficult to assess volume status given morbid obesity 2. Cr remains stable at around 5 with BUN over 100 3. Try to cont to avoid nephrotoxic agents as possible 4. Discussed case with Nephrology on 4/15. Rec to continue gentle hydration as tolerated. Will cont for now and repeat bmet in AM. If no significant change in renal function despite hydration, would formally consult Nephrology 6. Altered mental status 1. Much improved 2. Pt axo x3, stable 7. Bulla ruptures 1. Noted in multiple areas under the patient 2. Do not appear infected at present with good granulation seen 3. Appreciate input by WOC. Recs noted 8. DVT prophylaxis 1. Continue SCD's for prophylaxis  Code Status: No intubation or chest compressions Family Communication: Pt in room Disposition Plan: Uncertain at this time, remains in stepdown   Consultants:  ID  Critical Care  Palliative Care  Discussed case with Nephrology over phone  Procedures:    Antibiotics: Anti-infectives    Start     Dose/Rate Route Frequency  Ordered Stop   06/02/15 2100  sodium chloride 0.9 % 100 mL with Minocycline HCl 100 mg infusion     100 mL/hr  Intravenous Every 12 hours 06/02/15 1307 06/06/15 0859   06/01/15 2200  Minocycline HCl infusion 100 mg  Status:  Discontinued     100 mg 100 mL/hr over 60 Minutes Intravenous Every 12 hours 06/01/15 1546 06/02/15 1303   06/01/15 1000  Minocycline HCl infusion 100 mg  Status:  Discontinued     100 mg 4.2 mL/hr over 60 Minutes Intravenous Every 12 hours 06/01/15 0911 06/01/15 1546   06/01/15 1000  sodium chloride 0.9 % 1,000 mL with Minocycline HCl 100 mg infusion  Status:  Discontinued     4.2 mL/hr  Intravenous Every 12 hours 06/01/15 0926 06/01/15 0926   06/01/15 0930  ceftazidime-avibactam (AVYCAZ) 0.94 g in dextrose 5 % 50 mL IVPB  Status:  Discontinued     0.94 g 25 mL/hr over 2 Hours Intravenous Every 24 hours 06/01/15 0918 06/01/15 0923   05/31/15 1000  ceftazidime-avibactam (AVYCAZ) 1.25 g in dextrose 5 % 50 mL IVPB     1.25 g 25 mL/hr over 2 Hours Intravenous Every 8 hours 05/31/15 0914     05/31/15 0900  ceftazidime-avibactam (AVYCAZ) 2.5 g in dextrose 5 % 50 mL IVPB  Status:  Discontinued     2.5 g 25 mL/hr over 2 Hours Intravenous 3 times per day 05/31/15 0851 05/31/15 0857   05/30/15 1800  colistimethate (COLYMYCIN) 300 mg in sodium chloride 0.9 % 100 mL IVPB     300 mg 208 mL/hr over 30 Minutes Intravenous  Once  05/30/15 1716 05/30/15 1847   05/30/15 0405  vancomycin (VANCOCIN) 2,000 mg in sodium chloride 0.9 % 500 mL IVPB  Status:  Discontinued     2,000 mg 250 mL/hr over 120 Minutes Intravenous Every 48 hours 05/28/15 0842 05/28/15 1334   05/28/15 1700  meropenem (MERREM) 2 g in sodium chloride 0.9 % 100 mL IVPB  Status:  Discontinued     2 g 200 mL/hr over 30 Minutes Intravenous Every 12 hours 05/28/15 0841 05/31/15 0851   05/28/15 0400  vancomycin (VANCOCIN) 1,750 mg in sodium chloride 0.9 % 500 mL IVPB  Status:  Discontinued     1,750 mg 250 mL/hr over 120  Minutes Intravenous Every 24 hours 05/28/15 0330 05/28/15 0842   05/25/15 1700  meropenem (MERREM) 1 g in sodium chloride 0.9 % 100 mL IVPB  Status:  Discontinued     1 g 200 mL/hr over 30 Minutes Intravenous Every 12 hours 05/25/15 1255 05/28/15 0841   05/24/15 0945  meropenem (MERREM) 1 g in sodium chloride 0.9 % 100 mL IVPB  Status:  Discontinued     1 g 200 mL/hr over 30 Minutes Intravenous 3 times per day 05/24/15 0939 05/25/15 1255   05/23/15 2200  piperacillin-tazobactam (ZOSYN) IVPB 3.375 g  Status:  Discontinued     3.375 g 12.5 mL/hr over 240 Minutes Intravenous 3 times per day 05/23/15 1257 05/24/15 0939   05/23/15 1300  vancomycin (VANCOCIN) 2,500 mg in sodium chloride 0.9 % 500 mL IVPB     2,500 mg 250 mL/hr over 120 Minutes Intravenous  Once 05/23/15 1257 05/23/15 1656   05/23/15 1300  piperacillin-tazobactam (ZOSYN) IVPB 3.375 g     3.375 g 100 mL/hr over 30 Minutes Intravenous  Once 05/23/15 1257 05/23/15 1417      HPI/Subjective: Patient in good spirits this AM. No complaints  Objective: Filed Vitals:   06/02/15 2359 06/03/15 0350 06/03/15 0816 06/03/15 1100  BP: 85/67 99/56 105/64 109/65  Pulse: 116 110 115 106  Temp: 98 F (36.7 C) 97.8 F (36.6 C) 98.4 F (36.9 C) 98.8 F (37.1 C)  TempSrc: Oral Oral Oral Oral  Resp: Height:      Weight:      SpO2: 100% 93% 97% 96%    Intake/Output Summary (Last 24 hours) at 06/03/15 1457 Last data filed at 06/03/15 1300  Gross per 24 hour  Intake   1473 ml  Output   1225 ml  Net    248 ml   Filed Weights   05/31/15 0500 06/01/15 0436 06/01/15 1000  Weight: 300.735 kg (663 lb) 196.408 kg (433 lb) 301.188 kg (664 lb)    Exam:   General:  Awake, sitting in bed, eating breakfast  Cardiovascular: tachycardic, s1, s2  Respiratory: normal resp effort, no wheezing  Abdomen: morbidly obese, nondistended, pos BS  Musculoskeletal: perfused, no clubbing  Data Reviewed: Basic Metabolic  Panel:  Recent Labs Lab 05/28/15 0410 05/29/15 0452 05/31/15 0530 06/01/15 0457 06/02/15 0504 06/03/15 0659  NA 138 139 139 137 137 135  K 4.8 4.6 4.1 4.4 4.7 4.6  CL 100* 100* 100* 99* 101 100*  CO2 GLUCOSE 162* 149* 118* 97 107* 111*  BUN 76* 83* 104* 107* 108* 115*  CREATININE 5.02* 5.02* 5.12* 5.17* 4.84* 4.99*  CALCIUM 7.2* 7.3* 7.8* 8.0* 8.2* 8.5*  MG 2.0  --   --   --   --   --  Liver Function Tests:  Recent Labs Lab 05/29/15 0452  AST 18  ALT 33  ALKPHOS 86  BILITOT 0.7  PROT 5.6*  ALBUMIN 1.6*   No results for input(s): LIPASE, AMYLASE in the last 168 hours. No results for input(s): AMMONIA in the last 168 hours. CBC:  Recent Labs Lab 05/29/15 0452 05/31/15 0530 06/01/15 0457 06/02/15 0504 06/03/15 0659  WBC 39.3* 32.2* 26.7* 22.9* 20.9*  HGB 10.2* 9.9* 10.0* 9.5* 9.5*  HCT 30.4* 30.1* 30.6* 29.6* 29.0*  MCV 91.8 91.5 91.6 91.9 93.5  PLT 66* 130* 147* 150 175   Cardiac Enzymes:  Recent Labs Lab 06/03/15 0943  CKTOTAL 111   BNP (last 3 results) No results for input(s): BNP in the last 8760 hours.  ProBNP (last 3 results) No results for input(s): PROBNP in the last 8760 hours.  CBG:  Recent Labs Lab 06/02/15 1933 06/02/15 2359 06/03/15 0353 06/03/15 0814 06/03/15 1224  GLUCAP 127* 90 121* 104* 108*    Recent Results (from the past 240 hour(s))  Culture, blood (routine x 2)     Status: Abnormal (Preliminary result)   Collection Time: 05/27/15 12:01 PM  Result Value Ref Range Status   Specimen Description BLOOD CENTRAL LINE  Final   Special Requests BOTTLES DRAWN AEROBIC AND ANAEROBIC 10CC  Final   Culture  Setup Time   Final    GRAM NEGATIVE COCCOBACILLI IN BOTH AEROBIC AND ANAEROBIC BOTTLES CRITICAL RESULT CALLED TO, READ BACK BY AND VERIFIED WITH: TO BSHEPARD(RN) BY TCLEVELAND 05/28/2015 AT 2:52AM CORRECTED RESULTS CALLED TO: L WILSON,RN AT 0865 05/28/15 BY L BENFIELD PREVIOUSLY REPORTED AS GRAM POSITIVE  COCCI    Culture (A)  Final    ACINETOBACTER CALCOACETICUS/BAUMANNII COMPLEX MULTI-DRUG RESISTANT ORGANISM SENDING TO REFERENCE LAB FOR COLISITIN, TETRACYCLINE AND AMIKACIN SUSCEPTIBILITIES    Report Status PENDING  Incomplete   Organism ID, Bacteria ACINETOBACTER CALCOACETICUS/BAUMANNII COMPLEX  Final      Susceptibility   Acinetobacter calcoaceticus/baumannii complex - MIC*    CEFTAZIDIME 16 INTERMEDIATE Intermediate     CEFTRIAXONE >=64 RESISTANT Resistant     CIPROFLOXACIN >=4 RESISTANT Resistant     GENTAMICIN >=16 RESISTANT Resistant     IMIPENEM >=16 RESISTANT Resistant     PIP/TAZO >=128 RESISTANT Resistant     TRIMETH/SULFA >=320 RESISTANT Resistant     AMPICILLIN/SULBACTAM 16 INTERMEDIATE Intermediate     * ACINETOBACTER CALCOACETICUS/BAUMANNII COMPLEX  Culture, blood (routine x 2)     Status: Abnormal   Collection Time: 05/27/15 12:07 PM  Result Value Ref Range Status   Specimen Description BLOOD RIGHT HAND  Final   Special Requests BOTTLES DRAWN AEROBIC ONLY 5CC  Final   Culture  Setup Time   Final    GRAM NEGATIVE COCCOBACILLI AEROBIC BOTTLE ONLY CRITICAL RESULT CALLED TO, READ BACK BY AND VERIFIED WITH: TO BSHEPARD (RN) BY TCLEVELAND AT 2:54AM CORRECTED RESULTS CALLED TO: L WILSON,RN AT 7846 05/28/15 BY L BENFIELD PREVIOUSLY REPORTED AS GRAM POSITIVE COCCI    Culture (A)  Final    ACINETOBACTER CALCOACETICUS/BAUMANNII COMPLEX SUSCEPTIBILITIES PERFORMED ON PREVIOUS CULTURE WITHIN THE LAST 5 DAYS.    Report Status 05/31/2015 FINAL  Final  Culture, blood (Routine X 2) w Reflex to ID Panel     Status: None (Preliminary result)   Collection Time: 05/29/15  9:43 AM  Result Value Ref Range Status   Specimen Description BLOOD LEFT ARM  Final   Special Requests IN PEDIATRIC BOTTLE  4CC  Final   Culture NO GROWTH 4 DAYS  Final   Report Status PENDING  Incomplete  Culture, blood (Routine X 2) w Reflex to ID Panel     Status: None (Preliminary result)   Collection Time:  05/29/15  9:50 AM  Result Value Ref Range Status   Specimen Description BLOOD LEFT ARM  Final   Special Requests IN PEDIATRIC BOTTLE  4CC  Final   Culture NO GROWTH 4 DAYS  Final   Report Status PENDING  Incomplete  Culture, blood (Routine X 2) w Reflex to ID Panel     Status: None (Preliminary result)   Collection Time: 06/01/15  3:36 PM  Result Value Ref Range Status   Specimen Description BLOOD RIGHT HAND  Final   Special Requests IN PEDIATRIC BOTTLE 4CC  Final   Culture NO GROWTH 1 DAY  Final   Report Status PENDING  Incomplete  Culture, blood (Routine X 2) w Reflex to ID Panel     Status: None (Preliminary result)   Collection Time: 06/01/15  3:46 PM  Result Value Ref Range Status   Specimen Description BLOOD RIGHT ANTECUBITAL  Final   Special Requests IN PEDIATRIC BOTTLE 3CC  Final   Culture NO GROWTH 1 DAY  Final   Report Status PENDING  Incomplete     Studies: No results found.  Scheduled Meds: . amiodarone  400 mg Oral BID  . antiseptic oral rinse  7 mL Mouth Rinse q12n4p  . ceftazidime avibactam (AVYCAZ) IVPB  1.25 g Intravenous Q8H  . chlorhexidine  15 mL Mouth Rinse BID  . feeding supplement (PRO-STAT SUGAR FREE 64)  30 mL Per Tube TID  . insulin aspart  0-9 Units Subcutaneous 6 times per day  . levothyroxine  50 mcg Oral QAC breakfast  . pantoprazole (PROTONIX) IV  40 mg Intravenous Q24H  . silver sulfADIAZINE   Topical Daily  . sodium chloride 0.9 % 100 mL with Minocycline HCl 100 mg infusion   Intravenous Q12H   Continuous Infusions: . sodium chloride 75 mL/hr at 06/03/15 1236    Principal Problem:   MDR Acinetobacter baumannii infection Active Problems:   Morbid obesity (HCC)   Lymphedema of both lower extremities   Cellulitis and abscess of trunk   Sepsis (HCC)   Septic shock (HCC)   Acute respiratory failure (HCC)   Palliative care encounter   Goals of care, counseling/discussion   Acute renal failure with tubular necrosis (HCC)   Abscess    Encounter for nasogastric (NG) tube placement   Encounter for hospice care discussion   Encounter for central line placement    CHIU, STEPHEN K  [facility] Pager [phone]. If 7PM-7AM, please contact night-coverage at [email or web address], password [facility] 06/03/2015, 2:57 PM  LOS: 11 days

## 2015-06-04 LAB — CBC
HEMATOCRIT: 29.5 % — AB (ref 36.0–46.0)
Hemoglobin: 9.5 g/dL — ABNORMAL LOW (ref 12.0–15.0)
MCH: 29.8 pg (ref 26.0–34.0)
MCHC: 32.2 g/dL (ref 30.0–36.0)
MCV: 92.5 fL (ref 78.0–100.0)
Platelets: 167 10*3/uL (ref 150–400)
RBC: 3.19 MIL/uL — ABNORMAL LOW (ref 3.87–5.11)
RDW: 15.7 % — AB (ref 11.5–15.5)
WBC: 16.1 10*3/uL — ABNORMAL HIGH (ref 4.0–10.5)

## 2015-06-04 LAB — GLUCOSE, CAPILLARY
GLUCOSE-CAPILLARY: 103 mg/dL — AB (ref 65–99)
GLUCOSE-CAPILLARY: 110 mg/dL — AB (ref 65–99)
GLUCOSE-CAPILLARY: 111 mg/dL — AB (ref 65–99)
Glucose-Capillary: 90 mg/dL (ref 65–99)
Glucose-Capillary: 96 mg/dL (ref 65–99)
Glucose-Capillary: 106 mg/dL — ABNORMAL HIGH (ref 65–99)

## 2015-06-04 LAB — BASIC METABOLIC PANEL
Anion gap: 14 (ref 5–15)
BUN: 116 mg/dL — AB (ref 6–20)
CHLORIDE: 99 mmol/L — AB (ref 101–111)
CO2: 23 mmol/L (ref 22–32)
Calcium: 8.6 mg/dL — ABNORMAL LOW (ref 8.9–10.3)
Creatinine, Ser: 5.17 mg/dL — ABNORMAL HIGH (ref 0.44–1.00)
GFR calc Af Amer: 11 mL/min — ABNORMAL LOW (ref >60)
GFR calc non Af Amer: 9 mL/min — ABNORMAL LOW (ref >60)
GLUCOSE: 110 mg/dL — AB (ref 65–99)
POTASSIUM: 4.9 mmol/L (ref 3.5–5.1)
Sodium: 136 mmol/L (ref 135–145)

## 2015-06-04 MED ORDER — ENSURE ENLIVE PO LIQD
237.0000 mL | Freq: Two times a day (BID) | ORAL | Status: DC
  Administered 2015-06-04 – 2015-06-05 (×3): 237 mL via ORAL

## 2015-06-04 MED ORDER — PRO-STAT SUGAR FREE PO LIQD
30.0000 mL | Freq: Two times a day (BID) | ORAL | Status: DC
  Administered 2015-06-04 – 2015-06-08 (×8): 30 mL via ORAL
  Filled 2015-06-04 (×8): qty 30

## 2015-06-04 MED FILL — Minocycline HCl IV For Soln 100 MG: INTRAVENOUS | Qty: 1 | Status: AC

## 2015-06-04 MED FILL — Minocycline HCl IV For Soln 100 MG: INTRAVENOUS | Qty: 1 | Status: CN

## 2015-06-04 MED FILL — Sodium Chloride IV Soln 0.9%: INTRAVENOUS | Qty: 100 | Status: AC

## 2015-06-04 NOTE — Clinical Social Work Note (Signed)
Patient transferred from [facility]. CSW has reviewed patient's chart. Patient currently on IV abx and plans to continue once d/c'ed.   [facility] Facility checking daily cost of abx.  CSW remains available as needed.   Derenda FennelBashira Carin Weber, MSW, LCSWA [phone] 06/04/2015 4:05 PM

## 2015-06-04 NOTE — Progress Notes (Addendum)
Report called to unit charge nurse; Annie at approx 1345, receiving nurse not available at time of call. Report given, pt will be transferred over as soon as several staff members are available to transfer pt via bariatric bed.      At 1529, Pt has been transferred to unit [address], room 3E21 via bariatric air mattress bed. Prior to transfer discussed removal of foley cath with patient, pt has multiple open wounds  all over her upper thighs and lower legs, areas weeping serosanguineous drainage in moderate to large amounts over time. Pads under legs and thighs changed prior to transfer. Skin sloughing off to inner thigh areas. Educated on possibility of UTI with continued use of indwelling foley catheter. Pt verbalized understanding and refuses to have foley catheter removed at this time. Family member at bedside with pt at this time.  Pt states she has had incontinence of bladder and bowel at times and doesn't want that to interfere with her wounds improving.  Receiving unit informed of pt's refusal to have foley cath removed at this time and her reasons why.

## 2015-06-04 NOTE — Progress Notes (Signed)
[facility] PROGRESS NOTE  Michele Weber:[id number] DOB: May 02, 1970 DOA: 05/23/2015 PCP: Angela Cox, MD  HPI/Brief narrative Morbidly obese 850lb pt who presented initially with AMS and hypotension with presenting sepsis with GNR bacteremia.  Assessment/Plan: 1. Gm neg bacteremia (acinetobacter) with sepsis present on admission 1. WBC today 16k with continued tachycardia, stable 2. Appreciate input by ID. Had been on meropenem. Now on Avycaz as of 4/13. Plan for 2 weeks of tx from date of clearance 3. Central line successfully replaced on 4/13 by PCCM 2. Morbid obesity 1. Stable thus far 2. Pt's baseline is being non-ambulatory and laying/sitting in bed 3. Hypoxemic hypercapnic resp failure secondary to morbid obesity 1. Per Pulmonary, CPAP QHS and can make PRN BiPAP. No intubation 4. Afib with RVR 1. Tachycardic. Suspect worsened by active sepsis 2. On amiodarone 400mg  bid 3. Pt was earlier given trial of metoprolol, however pt became hypotensive, thus d/c'd 5. Acute on CKD 1. Difficult to assess volume status given morbid obesity 2. Cr remains stable at around 5 with BUN over 100 3. Cont to try to cont to avoid nephrotoxic agents as possible 4. Had discussed case with Nephrology on 4/15. Rec at that time to continue gentle hydration as tolerated. No significant change in renal function this AM, would would formally consult Nephrology for further assistance 6. Altered mental status 1. Much improved since admission 2. Pt axo x3, stable 7. Bulla ruptures 1. Noted in multiple areas under the patient 2. Do not appear infected at present with good granulation seen 3. Appreciate input by WOC. Recs noted 8. DVT prophylaxis 1. Continue SCD's for prophylaxis  Code Status: No intubation or chest compressions Family Communication: Pt in room Disposition Plan: Uncertain at this time   Consultants:  ID  Critical Care  Palliative Care  Discussed case with  Nephrology over phone  Procedures:    Antibiotics: Anti-infectives    Start     Dose/Rate Route Frequency Ordered Stop   06/02/15 2100  Minocycline 100 mg in 0.9% Sodium Chloride     100 mL/hr  Intravenous Every 12 hours 06/02/15 1307 06/06/15 0859   06/01/15 2200  Minocycline HCl infusion 100 mg  Status:  Discontinued     100 mg 100 mL/hr over 60 Minutes Intravenous Every 12 hours 06/01/15 1546 06/02/15 1303   06/01/15 1000  Minocycline HCl infusion 100 mg  Status:  Discontinued     100 mg 4.2 mL/hr over 60 Minutes Intravenous Every 12 hours 06/01/15 0911 06/01/15 1546   06/01/15 1000  sodium chloride 0.9 % 1,000 mL with Minocycline HCl 100 mg infusion  Status:  Discontinued     4.2 mL/hr  Intravenous Every 12 hours 06/01/15 0926 06/01/15 0926   06/01/15 0930  ceftazidime-avibactam (AVYCAZ) 0.94 g in dextrose 5 % 50 mL IVPB  Status:  Discontinued     0.94 g 25 mL/hr over 2 Hours Intravenous Every 24 hours 06/01/15 0918 06/01/15 0923   05/31/15 1000  ceftazidime-avibactam (AVYCAZ) 1.25 g in dextrose 5 % 50 mL IVPB     1.25 g 25 mL/hr over 2 Hours Intravenous Every 8 hours 05/31/15 0914     05/31/15 0900  ceftazidime-avibactam (AVYCAZ) 2.5 g in dextrose 5 % 50 mL IVPB  Status:  Discontinued     2.5 g 25 mL/hr over 2 Hours Intravenous 3 times per day 05/31/15 0851 05/31/15 0857   05/30/15 1800  colistimethate (COLYMYCIN) 300 mg in sodium chloride 0.9 % 100 mL IVPB  300 mg 208 mL/hr over 30 Minutes Intravenous  Once 05/30/15 1716 05/30/15 1847   05/30/15 0405  vancomycin (VANCOCIN) 2,000 mg in sodium chloride 0.9 % 500 mL IVPB  Status:  Discontinued     2,000 mg 250 mL/hr over 120 Minutes Intravenous Every 48 hours 05/28/15 0842 05/28/15 1334   05/28/15 1700  meropenem (MERREM) 2 g in sodium chloride 0.9 % 100 mL IVPB  Status:  Discontinued     2 g 200 mL/hr over 30 Minutes Intravenous Every 12 hours 05/28/15 0841 05/31/15 0851   05/28/15 0400  vancomycin (VANCOCIN) 1,750 mg in  sodium chloride 0.9 % 500 mL IVPB  Status:  Discontinued     1,750 mg 250 mL/hr over 120 Minutes Intravenous Every 24 hours 05/28/15 0330 05/28/15 0842   05/25/15 1700  meropenem (MERREM) 1 g in sodium chloride 0.9 % 100 mL IVPB  Status:  Discontinued     1 g 200 mL/hr over 30 Minutes Intravenous Every 12 hours 05/25/15 1255 05/28/15 0841   05/24/15 0945  meropenem (MERREM) 1 g in sodium chloride 0.9 % 100 mL IVPB  Status:  Discontinued     1 g 200 mL/hr over 30 Minutes Intravenous 3 times per day 05/24/15 0939 05/25/15 1255   05/23/15 2200  piperacillin-tazobactam (ZOSYN) IVPB 3.375 g  Status:  Discontinued     3.375 g 12.5 mL/hr over 240 Minutes Intravenous 3 times per day 05/23/15 1257 05/24/15 0939   05/23/15 1300  vancomycin (VANCOCIN) 2,500 mg in sodium chloride 0.9 % 500 mL IVPB     2,500 mg 250 mL/hr over 120 Minutes Intravenous  Once 05/23/15 1257 05/23/15 1656   05/23/15 1300  piperacillin-tazobactam (ZOSYN) IVPB 3.375 g     3.375 g 100 mL/hr over 30 Minutes Intravenous  Once 05/23/15 1257 05/23/15 1417      HPI/Subjective: No complaints this AM  Objective: Filed Vitals:   06/04/15 0340 06/04/15 0341 06/04/15 0600 06/04/15 0728  BP: 111/67  113/75 118/63  Pulse: 119  107 110  Temp: 98.1 F (36.7 C)   97.2 F (36.2 C)  TempSrc: Oral   Oral  Resp: Height:      Weight:  299.374 kg (660 lb)    SpO2: 97%  96% 96%    Intake/Output Summary (Last 24 hours) at 06/04/15 0833 Last data filed at 06/04/15 0400  Gross per 24 hour  Intake   1745 ml  Output   1850 ml  Net   -105 ml   Filed Weights   06/01/15 0436 06/01/15 1000 06/04/15 0341  Weight: 196.408 kg (433 lb) 301.188 kg (664 lb) 299.374 kg (660 lb)    Exam:   General:  Awake, sitting in bed  Cardiovascular: tachycardic, s1, s2  Respiratory: normal resp effort, no wheezing  Abdomen: morbidly obese, nondistended, pos BS  Musculoskeletal: perfused, no cyanosis  Data Reviewed: Basic  Metabolic Panel:  Recent Labs Lab 05/31/15 0530 06/01/15 0457 06/02/15 0504 06/03/15 0659 06/04/15 0400  NA 139 137 137 135 136  K 4.1 4.4 4.7 4.6 4.9  CL 100* 99* 101 100* 99*  CO2 GLUCOSE 118* 97 107* 111* 110*  BUN 104* 107* 108* 115* 116*  CREATININE 5.12* 5.17* 4.84* 4.99* 5.17*  CALCIUM 7.8* 8.0* 8.2* 8.5* 8.6*   Liver Function Tests:  Recent Labs Lab 05/29/15 0452  AST 18  ALT 33  ALKPHOS 86  BILITOT 0.7  PROT 5.6*  ALBUMIN 1.6*   No results for input(s): LIPASE, AMYLASE in the last 168 hours. No results for input(s): AMMONIA in the last 168 hours. CBC:  Recent Labs Lab 05/31/15 0530 06/01/15 0457 06/02/15 0504 06/03/15 0659 06/04/15 0400  WBC 32.2* 26.7* 22.9* 20.9* 16.1*  HGB 9.9* 10.0* 9.5* 9.5* 9.5*  HCT 30.1* 30.6* 29.6* 29.0* 29.5*  MCV 91.5 91.6 91.9 93.5 92.5  PLT 130* 147* 150 175 167   Cardiac Enzymes:  Recent Labs Lab 06/03/15 0943  CKTOTAL 111   BNP (last 3 results) No results for input(s): BNP in the last 8760 hours.  ProBNP (last 3 results) No results for input(s): PROBNP in the last 8760 hours.  CBG:  Recent Labs Lab 06/03/15 1224 06/03/15 1541 06/03/15 1952 06/04/15 0013 06/04/15 0344  GLUCAP 108* 105* 92 111* 103*    Recent Results (from the past 240 hour(s))  Culture, blood (routine x 2)     Status: Abnormal (Preliminary result)   Collection Time: 05/27/15 12:01 PM  Result Value Ref Range Status   Specimen Description BLOOD CENTRAL LINE  Final   Special Requests BOTTLES DRAWN AEROBIC AND ANAEROBIC 10CC  Final   Culture  Setup Time   Final    GRAM NEGATIVE COCCOBACILLI IN BOTH AEROBIC AND ANAEROBIC BOTTLES CRITICAL RESULT CALLED TO, READ BACK BY AND VERIFIED WITH: TO BSHEPARD(RN) BY TCLEVELAND 05/28/2015 AT 2:52AM CORRECTED RESULTS CALLED TO: L WILSON,RN AT [id number] 05/28/15 BY L BENFIELD PREVIOUSLY REPORTED AS GRAM POSITIVE COCCI    Culture (A)  Final    ACINETOBACTER CALCOACETICUS/BAUMANNII  COMPLEX MULTI-DRUG RESISTANT ORGANISM SENDING TO REFERENCE LAB FOR COLISITIN, TETRACYCLINE AND AMIKACIN SUSCEPTIBILITIES    Report Status PENDING  Incomplete   Organism ID, Bacteria ACINETOBACTER CALCOACETICUS/BAUMANNII COMPLEX  Final      Susceptibility   Acinetobacter calcoaceticus/baumannii complex - MIC*    CEFTAZIDIME 16 INTERMEDIATE Intermediate     CEFTRIAXONE >=64 RESISTANT Resistant     CIPROFLOXACIN >=4 RESISTANT Resistant     GENTAMICIN >=16 RESISTANT Resistant     IMIPENEM >=16 RESISTANT Resistant     PIP/TAZO >=128 RESISTANT Resistant     TRIMETH/SULFA >=320 RESISTANT Resistant     AMPICILLIN/SULBACTAM 16 INTERMEDIATE Intermediate     * ACINETOBACTER CALCOACETICUS/BAUMANNII COMPLEX  Culture, blood (routine x 2)     Status: Abnormal   Collection Time: 05/27/15 12:07 PM  Result Value Ref Range Status   Specimen Description BLOOD RIGHT HAND  Final   Special Requests BOTTLES DRAWN AEROBIC ONLY 5CC  Final   Culture  Setup Time   Final    GRAM NEGATIVE COCCOBACILLI AEROBIC BOTTLE ONLY CRITICAL RESULT CALLED TO, READ BACK BY AND VERIFIED WITH: TO BSHEPARD (RN) BY TCLEVELAND AT 2:54AM CORRECTED RESULTS CALLED TO: L WILSON,RN AT [id number] 05/28/15 BY L BENFIELD PREVIOUSLY REPORTED AS GRAM POSITIVE COCCI    Culture (A)  Final    ACINETOBACTER CALCOACETICUS/BAUMANNII COMPLEX SUSCEPTIBILITIES PERFORMED ON PREVIOUS CULTURE WITHIN THE LAST 5 DAYS.    Report Status 05/31/2015 FINAL  Final  Culture, blood (Routine X 2) w Reflex to ID Panel     Status: None   Collection Time: 05/29/15  9:43 AM  Result Value Ref Range Status   Specimen Description BLOOD LEFT ARM  Final   Special Requests IN PEDIATRIC BOTTLE  4CC  Final   Culture NO GROWTH 5 DAYS  Final   Report Status 06/03/2015 FINAL  Final  Culture, blood (Routine X 2) w Reflex to ID Panel     Status: None  Collection Time: 05/29/15  9:50 AM  Result Value Ref Range Status   Specimen Description BLOOD LEFT ARM  Final   Special  Requests IN PEDIATRIC BOTTLE  4CC  Final   Culture NO GROWTH 5 DAYS  Final   Report Status 06/03/2015 FINAL  Final  Culture, blood (Routine X 2) w Reflex to ID Panel     Status: None (Preliminary result)   Collection Time: 06/01/15  3:36 PM  Result Value Ref Range Status   Specimen Description BLOOD RIGHT HAND  Final   Special Requests IN PEDIATRIC BOTTLE 4CC  Final   Culture NO GROWTH 2 DAYS  Final   Report Status PENDING  Incomplete  Culture, blood (Routine X 2) w Reflex to ID Panel     Status: None (Preliminary result)   Collection Time: 06/01/15  3:46 PM  Result Value Ref Range Status   Specimen Description BLOOD RIGHT ANTECUBITAL  Final   Special Requests IN PEDIATRIC BOTTLE 3CC  Final   Culture NO GROWTH 2 DAYS  Final   Report Status PENDING  Incomplete     Studies: No results found.  Scheduled Meds: . amiodarone  400 mg Oral BID  . antiseptic oral rinse  7 mL Mouth Rinse q12n4p  . ceftazidime avibactam (AVYCAZ) IVPB  1.25 g Intravenous Q8H  . chlorhexidine  15 mL Mouth Rinse BID  . feeding supplement (PRO-STAT SUGAR FREE 64)  30 mL Per Tube TID  . insulin aspart  0-9 Units Subcutaneous 6 times per day  . levothyroxine  50 mcg Oral QAC breakfast  . Minocycline 100 mg in 0.9% Sodium Chloride   Intravenous Q12H  . pantoprazole (PROTONIX) IV  40 mg Intravenous Q24H  . silver sulfADIAZINE   Topical Daily   Continuous Infusions:    Principal Problem:   MDR Acinetobacter baumannii infection Active Problems:   Morbid obesity (HCC)   Lymphedema of both lower extremities   Cellulitis and abscess of trunk   Sepsis (HCC)   Septic shock (HCC)   Acute respiratory failure (HCC)   Palliative care encounter   Goals of care, counseling/discussion   Acute renal failure with tubular necrosis (HCC)   Abscess   Encounter for nasogastric (NG) tube placement   Encounter for hospice care discussion   Encounter for central line placement    CHIU, STEPHEN K  [facility] Pager [phone]. If 7PM-7AM, please contact night-coverage at [email or web address], password [facility] 06/04/2015, 8:33 AM  LOS: 12 days

## 2015-06-04 NOTE — Consult Note (Signed)
Referring Provider: No ref. provider found Primary Care Physician:  Angela Cox, MD Primary Nephrologist:    Reason for Consultation:  Acute kidney Injury  With sepsis with acinetobacter  Bacteremia   HPI: This is a morbidly obese lady that was transferred from [facility] with a lactic acidosis and sepsis and acute kidney injury and was treated with antibiotics and pressors  She had acute oliguric renal failure. Since hospitalization she is 13 L positive balance, her creatinine was 5.2 and her utine output has improved. She weighs over 600lbs  Past Medical History  Diagnosis Date  . Morbid obesity (HCC)   . Lymphedema of both lower extremities   . Diastolic CHF (HCC)   . Vitamin D deficiency     History reviewed. No pertinent past surgical history.  Prior to Admission medications   Medication Sig Start Date End Date Taking? Authorizing Provider  acetaminophen (TYLENOL) 325 MG tablet Take 650 mg by mouth every 8 (eight) hours. scheduled   Yes Historical Provider, MD  acetaminophen (TYLENOL) 500 MG tablet Take 1,000 mg by mouth every 8 (eight) hours as needed (pain).   Yes Historical Provider, MD  albuterol (PROVENTIL HFA;VENTOLIN HFA) 108 (90 Base) MCG/ACT inhaler Inhale 2 puffs into the lungs every 6 (six) hours as needed for wheezing or shortness of breath.   Yes Historical Provider, MD  Amino Acids-Protein Hydrolys (FEEDING SUPPLEMENT, PRO-STAT SUGAR FREE 64,) LIQD Take 30 mLs by mouth 3 (three) times daily with meals. Patient taking differently: Take 30 mLs by mouth 3 (three) times daily. 8am 5pm, 8pm 10/04/13  Yes Calvert Cantor, MD  amiodarone (PACERONE) 200 MG tablet Take 1 tablet (200 mg total) by mouth 2 (two) times daily. Patient taking differently: Take 200 mg by mouth daily.  10/04/13  Yes Calvert Cantor, MD  Cholecalciferol (VITAMIN D3) 3000 units TABS Take 6,000 Units by mouth daily.   Yes Historical Provider, MD  diltiazem (CARDIZEM CD) 180 MG 24 hr capsule Take 1  capsule (180 mg total) by mouth daily. 08/15/13  Yes Christiane Ha, MD  docusate sodium (COLACE) 100 MG capsule Take 100 mg by mouth daily as needed for mild constipation.   Yes Historical Provider, MD  famotidine (PEPCID) 20 MG tablet Take 20 mg by mouth at bedtime.   Yes Historical Provider, MD  fluticasone (FLONASE) 50 MCG/ACT nasal spray Place 1 spray into both nostrils daily as needed for allergies or rhinitis.   Yes Historical Provider, MD  furosemide (LASIX) 40 MG tablet Take 40-60 mg by mouth 2 (two) times daily. Take 1 1/2 tablets (60 mg) by mouth every morning and 1 tablet (40 mg) daily at 6pm   Yes Historical Provider, MD  guaifenesin (HUMIBID E) 400 MG TABS tablet Take 600 mg by mouth 2 (two) times daily.   Yes Historical Provider, MD  guaifenesin (ROBITUSSIN) 100 MG/5ML syrup Take 200 mg by mouth every 6 (six) hours as needed for cough.   Yes Historical Provider, MD  ipratropium-albuterol (DUONEB) 0.5-2.5 (3) MG/3ML SOLN Take 3 mLs by nebulization every 6 (six) hours as needed (shortness of breath).   Yes Historical Provider, MD  levofloxacin (LEVAQUIN) 750 MG tablet Take 750 mg by mouth at bedtime. 7 day course started 05/21/15   Yes Historical Provider, MD  levothyroxine (SYNTHROID, LEVOTHROID) 50 MCG tablet Take 50 mcg by mouth daily before breakfast.   Yes Historical Provider, MD  loperamide (IMODIUM A-D) 2 MG tablet Take 2 mg by mouth 4 (four) times daily as  needed for diarrhea or loose stools. Maximum 8 tablets in 24 hours   Yes Historical Provider, MD  metoprolol tartrate (LOPRESSOR) 25 MG tablet Take 0.5 tablets (12.5 mg total) by mouth 2 (two) times daily. 10/04/13  Yes Calvert Cantor, MD  Multiple Vitamin (MULTIVITAMIN WITH MINERALS) TABS tablet Take 1 tablet by mouth daily.   Yes Historical Provider, MD  OXYGEN Inhale into the lungs at bedtime. 2-4 L   Yes Historical Provider, MD  potassium chloride SA (K-DUR,KLOR-CON) 20 MEQ tablet Take 20 mEq by mouth 2 (two) times daily. 9am,  5pm   Yes Historical Provider, MD  rivaroxaban (XARELTO) 20 MG TABS tablet Take 1 tablet (20 mg total) by mouth daily with supper. 08/15/13  Yes Christiane Ha, MD  saccharomyces boulardii (FLORASTOR) 250 MG capsule Take 250 mg by mouth 2 (two) times daily.   Yes Historical Provider, MD  simethicone (MYLICON) 80 MG chewable tablet Chew 80 mg by mouth every 6 (six) hours as needed (indigestion).   Yes Historical Provider, MD  Skin Protectants, Misc. (EUCERIN) cream Apply 1 application topically daily.   Yes Historical Provider, MD  topiramate (TOPAMAX) 50 MG tablet Take 50 mg by mouth 2 (two) times daily.   Yes Historical Provider, MD    Current Facility-Administered Medications  Medication Dose Route Frequency Provider Last Rate Last Dose  . 0.9 %  sodium chloride infusion  250 mL Intravenous PRN Rahul P Desai, PA-C 10 mL/hr at 05/29/15 2300 250 mL at 05/29/15 2300  . amiodarone (PACERONE) tablet 400 mg  400 mg Oral BID Leslye Peer, MD   400 mg at 06/04/15 1610  . antiseptic oral rinse (CPC / CETYLPYRIDINIUM CHLORIDE 0.05%) solution 7 mL  7 mL Mouth Rinse q12n4p Jose Angelo Dorcas Mcmurray, MD   7 mL at 06/03/15 1813  . ceftazidime-avibactam (AVYCAZ) 1.25 g in dextrose 5 % 50 mL IVPB  1.25 g Intravenous Q8H Randall Hiss, MD   1.25 g at 06/04/15 1028  . chlorhexidine (PERIDEX) 0.12 % solution 15 mL  15 mL Mouth Rinse BID Jose Alexis Frock, MD   15 mL at 06/04/15 9604  . feeding supplement (ENSURE ENLIVE) (ENSURE ENLIVE) liquid 237 mL  237 mL Oral BID BM Jerald Kief, MD      . feeding supplement (PRO-STAT SUGAR FREE 64) liquid 30 mL  30 mL Oral BID Jerald Kief, MD      . fentaNYL (SUBLIMAZE) injection 25-50 mcg  25-50 mcg Intravenous Q2H PRN Irean Hong, NP   50 mcg at 06/04/15 0831  . insulin aspart (novoLOG) injection 0-9 Units  0-9 Units Subcutaneous 6 times per day Zigmund Gottron, MD   1 Units at 06/03/15 0417  . levothyroxine (SYNTHROID, LEVOTHROID) tablet 50 mcg   50 mcg Oral QAC breakfast Jerald Kief, MD   50 mcg at 06/04/15 5409  . pantoprazole (PROTONIX) injection 40 mg  40 mg Intravenous Q24H Ejiroghene E Mariea Clonts, MD   40 mg at 06/04/15 1206  . silver sulfADIAZINE (SILVADENE) 1 % cream   Topical Daily Oretha Milch, MD        Allergies as of 05/22/2015  . (No Known Allergies)    Family History  Problem Relation Age of Onset  . Diabetes Father     Social History   Social History  . Marital Status: Divorced    Spouse Name: N/A  . Number of Children: N/A  . Years of Education: N/A  Occupational History  . Not on file.   Social History Main Topics  . Smoking status: Never Smoker   . Smokeless tobacco: Not on file  . Alcohol Use: No  . Drug Use: No  . Sexual Activity: Not Currently   Other Topics Concern  . Not on file   Social History Narrative    Review of Systems: Gen:  Fever and chills that have improved HEENT: No visual complaints, No history of Retinopathy. Normal external appearance No Epistaxis or Sore throat. No sinusitis.   CV: Denies chest pain, angina, palpitations, syncope, orthopnea, PND, peripheral edema, and claudication. Resp: Denies dyspnea at rest, dyspnea with exercise, cough, sputum, wheezing, coughing up blood, and pleurisy. GI: Denies vomiting blood, jaundice, and fecal incontinence.   Denies dysphagia or odynophagia. GU : Denies urinary burning, blood in urine, urinary frequency, urinary hesitancy, nocturnal urination, and urinary incontinence.  No renal calculi. MS: Denies joint pain, limitation of movement, and swelling, stiffness, low back pain, extremity pain. + muscle weakness,no cramps, atrophy.  No use of non steroidal antiinflammatory drugs. Derm: Denies rash, itching, dry skin, hives, moles, warts, or unhealing ulcers.  Psych: Denies depression, anxiety, memory loss, suicidal ideation, hallucinations, paranoia, and confusion. Heme: Denies bruising, bleeding, and enlarged lymph nodes. Neuro:  No headache.  No diplopia. No dysarthria.  No dysphasia.  No history of CVA.  No Seizures. No paresthesias.  No weakness. Endocrine No DM.  No Thyroid disease.  No Adrenal disease.  Physical Exam: Vital signs in last 24 hours: Temp:  [97.2 F (36.2 C)-98.6 F (37 C)] 97.7 F (36.5 C) (04/17 1154) Pulse Rate:  [107-122] 122 (04/17 1154) Resp:  [13-22] 22 (04/17 1154) BP: (94-118)/(38-92) 94/69 mmHg (04/17 1154) SpO2:  [96 %-100 %] 100 % (04/17 1154) Weight:  [299.374 kg (660 lb)] 299.374 kg (660 lb) (04/17 0341) Last BM Date: 06/04/15 (per night RN over night at 0400 ) General:   Alert,  Well-developed, well-nourished, pleasant and cooperative in NAD Head:  Normocephalic and atraumatic. Eyes:  Sclera clear, no icterus.   Conjunctiva pink. Ears:  Normal auditory acuity. Nose:  No deformity, discharge,  or lesions. Mouth:  No deformity or lesions, dentition normal. Neck:  Supple; no masses or thyromegaly. JVP not elevated Lungs:  Clear throughout to auscultation.   No wheezes, crackles, or rhonchi. No acute distress. Heart:  Regular rate and rhythm; no murmurs, clicks, rubs,  or gallops. Abdomen:  Soft, nontender and nondistended. No masses, hepatosplenomegaly or hernias noted. Normal bowel sounds, without guarding, and without rebound.   Msk:  Symmetrical without gross deformities. Normal posture. Pulses:  No carotid, renal, femoral bruits. DP and PT symmetrical and equal Extremities:  Pannus with good peripheral pulses Neurologic:  Alert and  oriented x4;  grossly normal neurologically. Skin:  Intact without significant lesions or rashes. Cervical Nodes:  No significant cervical adenopathy. Psych:  Alert and cooperative. Normal mood and affect.  Intake/Output from previous day: 04/16 0701 - 04/17 0700 In: 1820 [P.O.:720; I.V.:1000; IV Piggyback:100] Out: 1850 [Urine:1850] Intake/Output this shift: Total I/O In: 240 [P.O.:240] Out: -   Lab Results:  Recent Labs   06/02/15 0504 06/03/15 0659 06/04/15 0400  WBC 22.9* 20.9* 16.1*  HGB 9.5* 9.5* 9.5*  HCT 29.6* 29.0* 29.5*  PLT 150 175 167   BMET  Recent Labs  06/02/15 0504 06/03/15 0659 06/04/15 0400  NA 137 135 136  K 4.7 4.6 4.9  CL 101 100* 99*  CO2 GLUCOSE  107* 111* 110*  BUN 108* 115* 116*  CREATININE 4.84* 4.99* 5.17*  CALCIUM 8.2* 8.5* 8.6*   LFT No results for input(s): PROT, ALBUMIN, AST, ALT, ALKPHOS, BILITOT, BILIDIR, IBILI in the last 72 hours. PT/INR No results for input(s): LABPROT, INR in the last 72 hours. Hepatitis Panel No results for input(s): HEPBSAG, HCVAB, HEPAIGM, HEPBIGM in the last 72 hours.  Studies/Results: No results found.  Assessment/Plan: Patient is a 45 year old female admitted with septic shock from a skilled nursing facility. She has bilateral lower extremity cellulitis and now with the acinetobacter bacteremia. She is also in cardiac and renal failure. Patient has been chronically ill for a number of years. She weighs 750 pounds, and up until year ago was living at home although very sedentary  1. Acute kidney Injury in setting of sepsis and hypotension. Urine output appears improved there are no indications for dialysis at this time. 2. HTN  Blood pressure soft will continue to follow 3. Anemia stable Hb < 10 4. Electrolytes and acid base stable  No evidence of uremia  I would hope that renal function returns   LOS: 12 Artice Bergerson W @TODAY @1 :55 PM

## 2015-06-04 NOTE — Evaluation (Signed)
Occupational Therapy Evaluation Patient Details Name: Michele Weber MRN: [id number] DOB: 29-May-1970 Today's Date: 06/04/2015    History of Present Illness Patient is a 45 yo female admitted 05/23/15 with hypotension, sepsis, cellulitis/lymphedema BLE's, acute resp failure, Afib with RVR, acute renal failure.   PMH:  morbid obesity, afib with RVR, cellulitis, CHF   Clinical Impression   Patient evaluated by Occupational Therapy with no further acute OT needs identified. All education has been completed and the patient has no further questions. Pt is at baseline from SNF facility per patient reports. See below for any follow-up Occupational Therapy or equipment needs. OT to sign off. Thank you for referral.    Recommend bariatric facility to help with patients overall care and bariatric needs. Pt will need air mattress overlay due to BIL LE wounds and risk for further skin break down. Pt requesting placement toward [place] in [place].     Follow Up Recommendations  SNF (bariatric facility to help with patients care)    Equipment Recommendations  Other (comment) (bariatric bed, air mattress overlay)    Recommendations for Other Services       Precautions / Restrictions Precautions Precautions: Fall Precaution Comments: needs repositioning by staff due to wounds and decr ability to reposition      Mobility Bed Mobility Overal bed mobility: Needs Assistance             General bed mobility comments: pt demonstrates ability to pump ankles and requires (A) to slide BIL LE. Pt reports incr pain is greatest limiting factor with rolling compared to baseline.  Noted large amounts of drainage on L LE lateral aspect  Transfers                 General transfer comment: not appropriate    Balance                                            ADL Overall ADL's : At baseline                                       General ADL Comments: Pt  completed UB bathing with OT this session. pt after basin setup able to complete task. pt c/o difficulty feeling the wash cloth on finger tip to clean inside of ears but reports otherwise the same     Vision     Perception     Praxis      Pertinent Vitals/Pain Pain Assessment: Faces Faces Pain Scale: Hurts even more Pain Location: L LE Pain Descriptors / Indicators: Discomfort Pain Intervention(s): Repositioned;Patient requesting pain meds-RN notified     Hand Dominance Right   Extremity/Trunk Assessment Upper Extremity Assessment Upper Extremity Assessment: Generalized weakness;RUE deficits/detail;LUE deficits/detail RUE Deficits / Details: numbness at the finger tips only, pt self reports decr grasp but able to ring out wash cloth and open bottle of soap. pt encouraged to continue bil hand use  LUE Deficits / Details: numbness at the finger tips only, pt self reports decr grasp but able to ring out wash cloth and open bottle of soap. pt encouraged to continue bil hand use   Lower Extremity Assessment Lower Extremity Assessment: Defer to PT evaluation       Communication Communication Communication: No difficulties   Cognition Arousal/Alertness:  Awake/alert Behavior During Therapy: WFL for tasks assessed/performed;Flat affect Overall Cognitive Status: Within Functional Limits for tasks assessed                     General Comments       Exercises Exercises: Other exercises Other Exercises Other Exercises: pt able to verbalize theraband program. pt with theraband provided to patient with handouts from previous PT session. pt starting exercises at the end of the session   Shoulder Instructions      Home Living Family/patient expects to be discharged to:: Skilled nursing facility                                 Additional Comments: Patient admitted from [facility].  Reports [place].       Prior Functioning/Environment Level of Independence: Needs assistance  Gait / Transfers Assistance Needed: Per patient, bedridden for more than a year. ADL's / Homemaking Assistance Needed: Able to feed self.  Assist with all other ADL's        OT Diagnosis:     OT Problem List:     OT Treatment/Interventions:      OT Goals(Current goals can be found in the care plan section) Acute Rehab OT Goals OT Goal Formulation: With patient  OT Frequency:     Barriers to D/C:            Co-evaluation              End of Session    Activity Tolerance: Patient tolerated treatment well Patient left: in bed;with call bell/phone within reach   Time: 0800-0825 OT Time Calculation (min): 25 min Charges:  OT General Charges $OT Visit: 1 Procedure OT Evaluation $OT Eval Moderate Complexity: 1 Procedure G-Codes:    Boone MasterJones, Eldin Bonsell B 06/04/2015, 8:42 AM   Mateo FlowJones, Brynn   OTR/L Pager: [phone] Office: [phone] .

## 2015-06-04 NOTE — Progress Notes (Signed)
FYI - Update from microbiology lab for Acinetobacter blood culture susceptibilities from 4/9 isolate: - Colistin, MIC 0.5 = sensitive - Amikacin, MIC < 16 = sensitive - Minocycline = resistant (no MIC reported) - Tetracycline, MIC 8 = resistant - Avycaz - [facility] to [facility], will hopefully have report in next few days  Ezio Wieck L. Roseanne RenoStewart, PharmD PGY2 Infectious Diseases Pharmacy Resident Pager: [phone] 06/04/2015 10:56 AM

## 2015-06-04 NOTE — Progress Notes (Signed)
INFECTIOUS DISEASE PROGRESS NOTE  ID: Michele Weber is a 45 y.o. female with  Principal Problem:   MDR Acinetobacter baumannii infection Active Problems:   Morbid obesity (HCC)   Lymphedema of both lower extremities   Cellulitis and abscess of trunk   Sepsis (HCC)   Septic shock (HCC)   Acute respiratory failure (HCC)   Palliative care encounter   Goals of care, counseling/discussion   Acute renal failure with tubular necrosis (HCC)   Abscess   Encounter for nasogastric (NG) tube placement   Encounter for hospice care discussion   Encounter for central line placement  Subjective: Without complaints  Abtx:  Anti-infectives    Start     Dose/Rate Route Frequency Ordered Stop   06/02/15 2100  Minocycline 100 mg in 0.9% Sodium Chloride     100 mL/hr  Intravenous Every 12 hours 06/02/15 1307 06/06/15 0859   06/01/15 2200  Minocycline HCl infusion 100 mg  Status:  Discontinued     100 mg 100 mL/hr over 60 Minutes Intravenous Every 12 hours 06/01/15 1546 06/02/15 1303   06/01/15 1000  Minocycline HCl infusion 100 mg  Status:  Discontinued     100 mg 4.2 mL/hr over 60 Minutes Intravenous Every 12 hours 06/01/15 0911 06/01/15 1546   06/01/15 1000  sodium chloride 0.9 % 1,000 mL with Minocycline HCl 100 mg infusion  Status:  Discontinued     4.2 mL/hr  Intravenous Every 12 hours 06/01/15 0926 06/01/15 0926   06/01/15 0930  ceftazidime-avibactam (AVYCAZ) 0.94 g in dextrose 5 % 50 mL IVPB  Status:  Discontinued     0.94 g 25 mL/hr over 2 Hours Intravenous Every 24 hours 06/01/15 0918 06/01/15 0923   05/31/15 1000  ceftazidime-avibactam (AVYCAZ) 1.25 g in dextrose 5 % 50 mL IVPB     1.25 g 25 mL/hr over 2 Hours Intravenous Every 8 hours 05/31/15 0914     05/31/15 0900  ceftazidime-avibactam (AVYCAZ) 2.5 g in dextrose 5 % 50 mL IVPB  Status:  Discontinued     2.5 g 25 mL/hr over 2 Hours Intravenous 3 times per day 05/31/15 0851 05/31/15 0857   05/30/15 1800  colistimethate  (COLYMYCIN) 300 mg in sodium chloride 0.9 % 100 mL IVPB     300 mg 208 mL/hr over 30 Minutes Intravenous  Once 05/30/15 1716 05/30/15 1847   05/30/15 0405  vancomycin (VANCOCIN) 2,000 mg in sodium chloride 0.9 % 500 mL IVPB  Status:  Discontinued     2,000 mg 250 mL/hr over 120 Minutes Intravenous Every 48 hours 05/28/15 0842 05/28/15 1334   05/28/15 1700  meropenem (MERREM) 2 g in sodium chloride 0.9 % 100 mL IVPB  Status:  Discontinued     2 g 200 mL/hr over 30 Minutes Intravenous Every 12 hours 05/28/15 0841 05/31/15 0851   05/28/15 0400  vancomycin (VANCOCIN) 1,750 mg in sodium chloride 0.9 % 500 mL IVPB  Status:  Discontinued     1,750 mg 250 mL/hr over 120 Minutes Intravenous Every 24 hours 05/28/15 0330 05/28/15 0842   05/25/15 1700  meropenem (MERREM) 1 g in sodium chloride 0.9 % 100 mL IVPB  Status:  Discontinued     1 g 200 mL/hr over 30 Minutes Intravenous Every 12 hours 05/25/15 1255 05/28/15 0841   05/24/15 0945  meropenem (MERREM) 1 g in sodium chloride 0.9 % 100 mL IVPB  Status:  Discontinued     1 g 200 mL/hr over 30 Minutes Intravenous 3 times  per day 05/24/15 0939 05/25/15 1255   05/23/15 2200  piperacillin-tazobactam (ZOSYN) IVPB 3.375 g  Status:  Discontinued     3.375 g 12.5 mL/hr over 240 Minutes Intravenous 3 times per day 05/23/15 1257 05/24/15 0939   05/23/15 1300  vancomycin (VANCOCIN) 2,500 mg in sodium chloride 0.9 % 500 mL IVPB     2,500 mg 250 mL/hr over 120 Minutes Intravenous  Once 05/23/15 1257 05/23/15 1656   05/23/15 1300  piperacillin-tazobactam (ZOSYN) IVPB 3.375 g     3.375 g 100 mL/hr over 30 Minutes Intravenous  Once 05/23/15 1257 05/23/15 1417      Medications:  Scheduled: . amiodarone  400 mg Oral BID  . antiseptic oral rinse  7 mL Mouth Rinse q12n4p  . ceftazidime avibactam (AVYCAZ) IVPB  1.25 g Intravenous Q8H  . chlorhexidine  15 mL Mouth Rinse BID  . feeding supplement (PRO-STAT SUGAR FREE 64)  30 mL Per Tube TID  . insulin aspart   0-9 Units Subcutaneous 6 times per day  . levothyroxine  50 mcg Oral QAC breakfast  . Minocycline 100 mg in 0.9% Sodium Chloride   Intravenous Q12H  . pantoprazole (PROTONIX) IV  40 mg Intravenous Q24H  . silver sulfADIAZINE   Topical Daily    Objective: Vital signs in last 24 hours: Temp:  [97.2 F (36.2 C)-98.8 F (37.1 C)] 97.2 F (36.2 C) (04/17 0728) Pulse Rate:  [106-122] 122 (04/17 0800) Resp:  [13-19] 19 (04/17 0800) BP: (100-118)/(38-92) 116/68 mmHg (04/17 0800) SpO2:  [96 %-100 %] 98 % (04/17 0800) Weight:  [299.374 kg (660 lb)] 299.374 kg (660 lb) (04/17 0341)   General appearance: alert, cooperative and no distress Resp: clear to auscultation bilaterally Cardio: tachycardia GI: normal findings: bowel sounds normal and soft, non-tender and abnormal findings:  obese Extremities: edema diffuse and skin breakdown, bullae  Lab Results  Recent Labs  06/03/15 0659 06/04/15 0400  WBC 20.9* 16.1*  HGB 9.5* 9.5*  HCT 29.0* 29.5*  NA 135 136  K 4.6 4.9  CL 100* 99*  CO2 23 23  BUN 115* 116*  CREATININE 4.99* 5.17*   Liver Panel No results for input(s): PROT, ALBUMIN, AST, ALT, ALKPHOS, BILITOT, BILIDIR, IBILI in the last 72 hours. Sedimentation Rate No results for input(s): ESRSEDRATE in the last 72 hours. C-Reactive Protein No results for input(s): CRP in the last 72 hours.  Microbiology: Recent Results (from the past 240 hour(s))  Culture, blood (routine x 2)     Status: Abnormal (Preliminary result)   Collection Time: 05/27/15 12:01 PM  Result Value Ref Range Status   Specimen Description BLOOD CENTRAL LINE  Final   Special Requests BOTTLES DRAWN AEROBIC AND ANAEROBIC 10CC  Final   Culture  Setup Time   Final    GRAM NEGATIVE COCCOBACILLI IN BOTH AEROBIC AND ANAEROBIC BOTTLES CRITICAL RESULT CALLED TO, READ BACK BY AND VERIFIED WITH: TO BSHEPARD(RN) BY TCLEVELAND 05/28/2015 AT 2:52AM CORRECTED RESULTS CALLED TO: L WILSON,RN AT [id number] 05/28/15 BY L BENFIELD  PREVIOUSLY REPORTED AS GRAM POSITIVE COCCI    Culture (A)  Final    ACINETOBACTER CALCOACETICUS/BAUMANNII COMPLEX MULTI-DRUG RESISTANT ORGANISM SEE SEPARATE REPORT FOR COLISITIN, AMIKACIN, MINOCYCLINE AND TETRACYCLINE SENSITIVITIES Performed at [facility] REFERRING CULTURE TO [facility] FOR AVYCAZ MIC    Report Status PENDING  Incomplete   Organism ID, Bacteria ACINETOBACTER CALCOACETICUS/BAUMANNII COMPLEX  Final      Susceptibility   Acinetobacter calcoaceticus/baumannii complex - MIC*    CEFTAZIDIME 16 INTERMEDIATE  Intermediate     CEFTRIAXONE >=64 RESISTANT Resistant     CIPROFLOXACIN >=4 RESISTANT Resistant     GENTAMICIN >=16 RESISTANT Resistant     IMIPENEM >=16 RESISTANT Resistant     PIP/TAZO >=128 RESISTANT Resistant     TRIMETH/SULFA >=320 RESISTANT Resistant     AMPICILLIN/SULBACTAM 16 INTERMEDIATE Intermediate     * ACINETOBACTER CALCOACETICUS/BAUMANNII COMPLEX  Culture, blood (routine x 2)     Status: Abnormal   Collection Time: 05/27/15 12:07 PM  Result Value Ref Range Status   Specimen Description BLOOD RIGHT HAND  Final   Special Requests BOTTLES DRAWN AEROBIC ONLY 5CC  Final   Culture  Setup Time   Final    GRAM NEGATIVE COCCOBACILLI AEROBIC BOTTLE ONLY CRITICAL RESULT CALLED TO, READ BACK BY AND VERIFIED WITH: TO BSHEPARD (RN) BY TCLEVELAND AT 2:54AM CORRECTED RESULTS CALLED TO: L WILSON,RN AT 1610 05/28/15 BY L BENFIELD PREVIOUSLY REPORTED AS GRAM POSITIVE COCCI    Culture (A)  Final    ACINETOBACTER CALCOACETICUS/BAUMANNII COMPLEX SUSCEPTIBILITIES PERFORMED ON PREVIOUS CULTURE WITHIN THE LAST 5 DAYS.    Report Status 05/31/2015 FINAL  Final  Culture, blood (Routine X 2) w Reflex to ID Panel     Status: None   Collection Time: 05/29/15  9:43 AM  Result Value Ref Range Status   Specimen Description BLOOD LEFT ARM  Final   Special Requests IN PEDIATRIC BOTTLE  4CC  Final   Culture NO GROWTH 5 DAYS  Final   Report Status 06/03/2015 FINAL  Final    Culture, blood (Routine X 2) w Reflex to ID Panel     Status: None   Collection Time: 05/29/15  9:50 AM  Result Value Ref Range Status   Specimen Description BLOOD LEFT ARM  Final   Special Requests IN PEDIATRIC BOTTLE  4CC  Final   Culture NO GROWTH 5 DAYS  Final   Report Status 06/03/2015 FINAL  Final  Culture, blood (Routine X 2) w Reflex to ID Panel     Status: None (Preliminary result)   Collection Time: 06/01/15  3:36 PM  Result Value Ref Range Status   Specimen Description BLOOD RIGHT HAND  Final   Special Requests IN PEDIATRIC BOTTLE 4CC  Final   Culture NO GROWTH 2 DAYS  Final   Report Status PENDING  Incomplete  Culture, blood (Routine X 2) w Reflex to ID Panel     Status: None (Preliminary result)   Collection Time: 06/01/15  3:46 PM  Result Value Ref Range Status   Specimen Description BLOOD RIGHT ANTECUBITAL  Final   Special Requests IN PEDIATRIC BOTTLE 3CC  Final   Culture NO GROWTH 2 DAYS  Final   Report Status PENDING  Incomplete    Studies/Results: No results found.   Assessment/Plan: MDR Acinetobacter bacteremia (S- colistin, amikacin; R-mino) avycaz sensi pending.  ARF BCx 4-11 and 4-14 are ngtd Morbid obesity Skin breakdown  Total days of antibiotics: 5 avycaz,  Colistin 4-12 Central line placed 4-13 (no other IV's)  Will stop IV minocycline Continue avycaz, aim for 2 weeks from date of clearance Await more info from [facility] Greatly appreciate pharmacy help Wound care Renal f/u, Cr is at peak? Bariatric eval?         Michele Weber Infectious Diseases (pager) [phone] [email or web address] 06/04/2015, 10:33 AM  LOS: 12 days

## 2015-06-04 NOTE — Progress Notes (Addendum)
Nutrition Follow-up  DOCUMENTATION CODES:   Morbid obesity  INTERVENTION:   Ensure Enlive po BID, each supplement provides 350 kcal and 20 grams of protein  Prostat po 30 ml BID with meals, each supplement provides 100 kcal, 15 grams protein  NUTRITION DIAGNOSIS:   Inadequate oral intake now related to dysphagia as evidenced by  (meal completion 20-50%), ongoing  GOAL:   Patient will meet greater than or equal to 90% of their needs, progressing  MONITOR:   PO intake, Supplement acceptance, Labs, Weight trends, I & O's  ASSESSMENT:   [name] who [facility] 4/4 withAMS, hypotension , lactic acidosis, elevated pro calcitonin, BC with GNR, draining pannus wounds. She was treated with abx and promptly [facility] 4[facility].   Pt transferred from [facility] 4/11. TF (Vital HP) formula discontinued. Speech Path following.  S/p FEES 4/12.  PO intake variable at 20-50% per flowsheet records. Would benefit from oral nutrition supplements & liquid protein.  RD to order. Palliative Care Team notes reviewed.  Diet Order:  DIET DYS 3 Room service appropriate?: Yes; Fluid consistency:: Thin  Skin:   Lymphedema and cellulitis with sloughing of the skin and multiple of bulla  CBG (last 3)   Recent Labs  06/04/15 0344 06/04/15 0802 06/04/15 1152  GLUCAP 103* 90 96    Last BM:  4/17  Height:   Ht Readings from Last 1 Encounters:  05/23/15 5\' 6"  (1.676 m)    Weight:   Wt Readings from Last 1 Encounters:  06/04/15 660 lb (299.374 kg)    Ideal Body Weight:  59.1 kg  BMI:  Body mass index is 106.58 kg/(m^2).  Estimated Nutritional Needs:   Kcal:  1500-1700  Protein:  120-130 gm  Fluid:  >/= 2 L  EDUCATION NEEDS:   No education needs identified at this time  Maureen ChattersKatie Tamarius Rosenfield, RD, LDN Pager #: [phone] After-Hours Pager #: [phone]

## 2015-06-05 ENCOUNTER — Encounter (HOSPITAL_COMMUNITY): Payer: Self-pay | Admitting: General Practice

## 2015-06-05 LAB — GLUCOSE, CAPILLARY
GLUCOSE-CAPILLARY: 95 mg/dL (ref 65–99)
GLUCOSE-CAPILLARY: 99 mg/dL (ref 65–99)
GLUCOSE-CAPILLARY: 100 mg/dL — AB (ref 65–99)
Glucose-Capillary: 106 mg/dL — ABNORMAL HIGH (ref 65–99)
Glucose-Capillary: 137 mg/dL — ABNORMAL HIGH (ref 65–99)
Glucose-Capillary: 107 mg/dL — ABNORMAL HIGH (ref 65–99)

## 2015-06-05 LAB — CBC
HEMATOCRIT: 29 % — AB (ref 36.0–46.0)
Hemoglobin: 9.4 g/dL — ABNORMAL LOW (ref 12.0–15.0)
MCH: 29.9 pg (ref 26.0–34.0)
MCHC: 32.4 g/dL (ref 30.0–36.0)
MCV: 92.4 fL (ref 78.0–100.0)
Platelets: 169 10*3/uL (ref 150–400)
RBC: 3.14 MIL/uL — ABNORMAL LOW (ref 3.87–5.11)
RDW: 15.6 % — AB (ref 11.5–15.5)
WBC: 12.3 10*3/uL — ABNORMAL HIGH (ref 4.0–10.5)

## 2015-06-05 LAB — BASIC METABOLIC PANEL
Anion gap: 14 (ref 5–15)
BUN: 120 mg/dL — AB (ref 6–20)
CHLORIDE: 101 mmol/L (ref 101–111)
CO2: 22 mmol/L (ref 22–32)
Calcium: 8.8 mg/dL — ABNORMAL LOW (ref 8.9–10.3)
Creatinine, Ser: 4.8 mg/dL — ABNORMAL HIGH (ref 0.44–1.00)
GFR calc Af Amer: 12 mL/min — ABNORMAL LOW (ref >60)
GFR calc non Af Amer: 10 mL/min — ABNORMAL LOW (ref >60)
GLUCOSE: 108 mg/dL — AB (ref 65–99)
POTASSIUM: 4.4 mmol/L (ref 3.5–5.1)
Sodium: 137 mmol/L (ref 135–145)

## 2015-06-05 MED ORDER — SODIUM CHLORIDE 0.9% FLUSH
10.0000 mL | INTRAVENOUS | Status: DC | PRN
  Administered 2015-06-06: 20 mL
  Administered 2015-06-07: 10 mL
  Administered 2015-06-07: 20 mL
  Filled 2015-06-05 (×3): qty 40

## 2015-06-05 MED ORDER — PANTOPRAZOLE SODIUM 40 MG PO TBEC
40.0000 mg | DELAYED_RELEASE_TABLET | Freq: Every day | ORAL | Status: DC
Start: 2015-06-05 — End: 2015-06-09
  Administered 2015-06-05 – 2015-06-08 (×4): 40 mg via ORAL
  Filled 2015-06-05 (×4): qty 1

## 2015-06-05 MED FILL — Sodium Chloride IV Soln 0.9%: INTRAVENOUS | Qty: 100 | Status: AC

## 2015-06-05 MED FILL — Minocycline HCl IV For Soln 100 MG: INTRAVENOUS | Qty: 1 | Status: AC

## 2015-06-05 NOTE — Consult Note (Signed)
Subjective:  Feeling much better this morning. No complaints. Objective Vital signs in last 24 hours: Filed Vitals:   06/04/15 2100 06/05/15 0458 06/05/15 0747 06/05/15 1120  BP: 109/72 124/60 105/61 98/54  Pulse: 110 126 109 122  Temp: 98.7 F (37.1 C) 98.3 F (36.8 C) 97.6 F (36.4 C) 97.4 F (36.3 C)  TempSrc: Oral Oral Oral Oral  Resp: 22 20 20 20   Height:      Weight:  539 lb (244.489 kg)    SpO2: 94% 98% 98% 97%   Weight change: -121 lb (-54.885 kg)  Intake/Output Summary (Last 24 hours) at 06/05/15 1201 Last data filed at 06/05/15 1020  Gross per 24 hour  Intake   1057 ml  Output   3150 ml  Net  -2093 ml    Assessment/ Plan: Pt is a 45 y.o. yo female who was admitted on 05/23/2015 with septic shock and bacteremia. Oliguric renal failure and 13L positive balance.  Assessment/Plan: 1. Acute kidney Injury in setting of sepsis and hypotension. Urine output improved overnight (2.2L). Cr has dropped from 5.17 to 4.80. No indications for dialysis at this time. 2. HTN -  Blood pressure soft: Monitor 3. Anemia - stable Hb at 9.4 4. Electrolytes and acid base stable  No evidence of uremia I would hope that renal function returns  Mickie Hillieran Kordell Jafri    Labs: Basic Metabolic Panel:  Recent Labs Lab 06/03/15 0659 06/04/15 0400 06/05/15 0525  NA 135 136 137  K 4.6 4.9 4.4  CL 100* 99* 101  CO2 23 23 22   GLUCOSE 111* 110* 108*  BUN 115* 116* 120*  CREATININE 4.99* 5.17* 4.80*  CALCIUM 8.5* 8.6* 8.8*   Liver Function Tests: No results for input(s): AST, ALT, ALKPHOS, BILITOT, PROT, ALBUMIN in the last 168 hours. No results for input(s): LIPASE, AMYLASE in the last 168 hours. No results for input(s): AMMONIA in the last 168 hours. CBC:  Recent Labs Lab 06/01/15 0457 06/02/15 0504 06/03/15 0659 06/04/15 0400 06/05/15 0525  WBC 26.7* 22.9* 20.9* 16.1* 12.3*  HGB 10.0* 9.5* 9.5* 9.5* 9.4*  HCT 30.6* 29.6* 29.0* 29.5* 29.0*  MCV 91.6 91.9 93.5 92.5 92.4  PLT 147*  150 175 167 169   Cardiac Enzymes:  Recent Labs Lab 06/03/15 0943  CKTOTAL 111   CBG:  Recent Labs Lab 06/04/15 1723 06/04/15 2033 06/05/15 0214 06/05/15 0501 06/05/15 0751  GLUCAP 106* 110* 106* 95 99    Iron Studies: No results for input(s): IRON, TIBC, TRANSFERRIN, FERRITIN in the last 72 hours. Studies/Results: No results found. Medications: Infusions:    Scheduled Medications: . amiodarone  400 mg Oral BID  . antiseptic oral rinse  7 mL Mouth Rinse q12n4p  . ceftazidime avibactam (AVYCAZ) IVPB  1.25 g Intravenous Q8H  . chlorhexidine  15 mL Mouth Rinse BID  . feeding supplement (ENSURE ENLIVE)  237 mL Oral BID BM  . feeding supplement (PRO-STAT SUGAR FREE 64)  30 mL Oral BID  . levothyroxine  50 mcg Oral QAC breakfast  . pantoprazole  40 mg Oral Daily  . silver sulfADIAZINE   Topical Daily    have reviewed scheduled and prn medications.  Physical Exam: General: obese AA female laying in bed in NAD. Speaking in complete sentences. Heart: RRR in murmur Lungs: CTAB Abdomen: obese, SNTND Extremities: pulses intact bilaterally, no edema appreciated    06/05/2015,12:01 PM  LOS: 13 days

## 2015-06-05 NOTE — Progress Notes (Signed)
[facility] PROGRESS NOTE  Marcie MowersSharon Ahlin RUE:[id number]:[id number] DOB: 1970-12-20 DOA: 05/23/2015 PCP: Angela Coxasanayaka, Gayani Y, MD  HPI/Brief narrative Morbidly obese 850lb pt who presented initially with AMS and hypotension with presenting sepsis with GNR bacteremia.  Assessment/Plan: 1. Gm neg bacteremia (acinetobacter) with sepsis present on admission 1. WBC today 16k with continued tachycardia, stable 2. Appreciate input by ID. Had been on meropenem. Now on Avycaz as of 4/13. Plan for 2 weeks of tx from date of clearance 3. Central line successfully replaced on 4/13 by PCCM 2. Morbid obesity 1. Stable thus far 2. Pt's baseline is essentially being non-ambulatory and laying/sitting in bed 3. Rec for SNF by PT/OT 3. Hypoxemic hypercapnic resp failure secondary to morbid obesity 1. Per Pulmonary, CPAP QHS and can make PRN BiPAP. No intubation 4. Afib with RVR 1. Tachycardic. Suspect worsened by active sepsis 2. On amiodarone 400mg  bid 3. Pt was earlier given trial of metoprolol, however pt became hypotensive, thus d/c'd 5. Acute on CKD likely secondary to septic shock 1. Difficult to assess volume status given morbid obesity 2. Cr seems to be showing some improvement today with cr 4.8 3. Cont to try to cont to avoid nephrotoxic agents as possible 4. Nephrology is following. Recs to cont to monitor. Hopeful for continued improvement in renal function 6. Altered mental status 1. Much improved since admission 2. Pt axo x3, stable 7. Bulla ruptures 1. Noted in multiple areas under the patient 2. Do not appear infected at present with good granulation seen 3. Appreciate input by WOC. Recs noted 8. DVT prophylaxis 1. Continue SCD's for prophylaxis  Code Status: No intubation or chest compressions Family Communication: Pt in room Disposition Plan: SNF when renal function normalizes   Consultants:  ID  Critical Care  Palliative  Care  Nephrology  Procedures:    Antibiotics: Anti-infectives    Start     Dose/Rate Route Frequency Ordered Stop   06/02/15 2100  Minocycline 100 mg in 0.9% Sodium Chloride  Status:  Discontinued     100 mL/hr  Intravenous Every 12 hours 06/02/15 1307 06/04/15 1052   06/01/15 2200  Minocycline HCl infusion 100 mg  Status:  Discontinued     100 mg 100 mL/hr over 60 Minutes Intravenous Every 12 hours 06/01/15 1546 06/02/15 1303   06/01/15 1000  Minocycline HCl infusion 100 mg  Status:  Discontinued     100 mg 4.2 mL/hr over 60 Minutes Intravenous Every 12 hours 06/01/15 0911 06/01/15 1546   06/01/15 1000  sodium chloride 0.9 % 1,000 mL with Minocycline HCl 100 mg infusion  Status:  Discontinued     4.2 mL/hr  Intravenous Every 12 hours 06/01/15 0926 06/01/15 0926   06/01/15 0930  ceftazidime-avibactam (AVYCAZ) 0.94 g in dextrose 5 % 50 mL IVPB  Status:  Discontinued     0.94 g 25 mL/hr over 2 Hours Intravenous Every 24 hours 06/01/15 0918 06/01/15 0923   05/31/15 1000  ceftazidime-avibactam (AVYCAZ) 1.25 g in dextrose 5 % 50 mL IVPB     1.25 g 25 mL/hr over 2 Hours Intravenous Every 8 hours 05/31/15 0914     05/31/15 0900  ceftazidime-avibactam (AVYCAZ) 2.5 g in dextrose 5 % 50 mL IVPB  Status:  Discontinued     2.5 g 25 mL/hr over 2 Hours Intravenous 3 times per day 05/31/15 0851 05/31/15 0857   05/30/15 1800  colistimethate (COLYMYCIN) 300 mg in sodium chloride 0.9 % 100 mL IVPB     300 mg 208  mL/hr over 30 Minutes Intravenous  Once 05/30/15 1716 05/30/15 1847   05/30/15 0405  vancomycin (VANCOCIN) 2,000 mg in sodium chloride 0.9 % 500 mL IVPB  Status:  Discontinued     2,000 mg 250 mL/hr over 120 Minutes Intravenous Every 48 hours 05/28/15 0842 05/28/15 1334   05/28/15 1700  meropenem (MERREM) 2 g in sodium chloride 0.9 % 100 mL IVPB  Status:  Discontinued     2 g 200 mL/hr over 30 Minutes Intravenous Every 12 hours 05/28/15 0841 05/31/15 0851   05/28/15 0400  vancomycin  (VANCOCIN) 1,750 mg in sodium chloride 0.9 % 500 mL IVPB  Status:  Discontinued     1,750 mg 250 mL/hr over 120 Minutes Intravenous Every 24 hours 05/28/15 0330 05/28/15 0842   05/25/15 1700  meropenem (MERREM) 1 g in sodium chloride 0.9 % 100 mL IVPB  Status:  Discontinued     1 g 200 mL/hr over 30 Minutes Intravenous Every 12 hours 05/25/15 1255 05/28/15 0841   05/24/15 0945  meropenem (MERREM) 1 g in sodium chloride 0.9 % 100 mL IVPB  Status:  Discontinued     1 g 200 mL/hr over 30 Minutes Intravenous 3 times per day 05/24/15 0939 05/25/15 1255   05/23/15 2200  piperacillin-tazobactam (ZOSYN) IVPB 3.375 g  Status:  Discontinued     3.375 g 12.5 mL/hr over 240 Minutes Intravenous 3 times per day 05/23/15 1257 05/24/15 0939   05/23/15 1300  vancomycin (VANCOCIN) 2,500 mg in sodium chloride 0.9 % 500 mL IVPB     2,500 mg 250 mL/hr over 120 Minutes Intravenous  Once 05/23/15 1257 05/23/15 1656   05/23/15 1300  piperacillin-tazobactam (ZOSYN) IVPB 3.375 g     3.375 g 100 mL/hr over 30 Minutes Intravenous  Once 05/23/15 1257 05/23/15 1417      HPI/Subjective: No complaints this AM  Objective: Filed Vitals:   06/04/15 2100 06/05/15 0458 06/05/15 0747 06/05/15 1120  BP: 109/72 124/60 105/61 98/54  Pulse: 110 126 109 122  Temp: 98.7 F (37.1 C) 98.3 F (36.8 C) 97.6 F (36.4 C) 97.4 F (36.3 C)  TempSrc: Oral Oral Oral Oral  Resp: 22 20 20 20   Height:      Weight:  244.489 kg (539 lb)    SpO2: 94% 98% 98% 97%    Intake/Output Summary (Last 24 hours) at 06/05/15 1224 Last data filed at 06/05/15 1020  Gross per 24 hour  Intake   1057 ml  Output   3150 ml  Net  -2093 ml   Filed Weights   06/01/15 1000 06/04/15 0341 06/05/15 0458  Weight: 301.188 kg (664 lb) 299.374 kg (660 lb) 244.489 kg (539 lb)    Exam:   General:  Awake, laying in bed  Cardiovascular: tachycardic, s1, s2  Respiratory: normal resp effort, no wheezing  Abdomen: morbidly obese, nondistended, pos  BS  Musculoskeletal: perfused, no clubbing  Data Reviewed: Basic Metabolic Panel:  Recent Labs Lab 06/01/15 0457 06/02/15 0504 06/03/15 0659 06/04/15 0400 06/05/15 0525  NA 137 137 135 136 137  K 4.4 4.7 4.6 4.9 4.4  CL 99* 101 100* 99* 101  CO2 25 25 23 23 22   GLUCOSE 97 107* 111* 110* 108*  BUN 107* 108* 115* 116* 120*  CREATININE 5.17* 4.84* 4.99* 5.17* 4.80*  CALCIUM 8.0* 8.2* 8.5* 8.6* 8.8*   Liver Function Tests: No results for input(s): AST, ALT, ALKPHOS, BILITOT, PROT, ALBUMIN in the last 168 hours. No results for input(s): LIPASE,  AMYLASE in the last 168 hours. No results for input(s): AMMONIA in the last 168 hours. CBC:  Recent Labs Lab 06/01/15 0457 06/02/15 0504 06/03/15 0659 06/04/15 0400 06/05/15 0525  WBC 26.7* 22.9* 20.9* 16.1* 12.3*  HGB 10.0* 9.5* 9.5* 9.5* 9.4*  HCT 30.6* 29.6* 29.0* 29.5* 29.0*  MCV 91.6 91.9 93.5 92.5 92.4  PLT 147* 150 175 167 169   Cardiac Enzymes:  Recent Labs Lab 06/03/15 0943  CKTOTAL 111   BNP (last 3 results) No results for input(s): BNP in the last 8760 hours.  ProBNP (last 3 results) No results for input(s): PROBNP in the last 8760 hours.  CBG:  Recent Labs Lab 06/04/15 1723 06/04/15 2033 06/05/15 0214 06/05/15 0501 06/05/15 0751  GLUCAP 106* 110* 106* 95 99    Recent Results (from the past 240 hour(s))  Culture, blood (routine x 2)     Status: Abnormal (Preliminary result)   Collection Time: 05/27/15 12:01 PM  Result Value Ref Range Status   Specimen Description BLOOD CENTRAL LINE  Final   Special Requests BOTTLES DRAWN AEROBIC AND ANAEROBIC 10CC  Final   Culture  Setup Time   Final    GRAM NEGATIVE COCCOBACILLI IN BOTH AEROBIC AND ANAEROBIC BOTTLES CRITICAL RESULT CALLED TO, READ BACK BY AND VERIFIED WITH: TO BSHEPARD(RN) BY TCLEVELAND 05/28/2015 AT 2:52AM CORRECTED RESULTS CALLED TO: L WILSON,RN AT 4540 05/28/15 BY L BENFIELD PREVIOUSLY REPORTED AS GRAM POSITIVE COCCI    Culture (A)  Final     ACINETOBACTER CALCOACETICUS/BAUMANNII COMPLEX MULTI-DRUG RESISTANT ORGANISM SEE SEPARATE REPORT FOR COLISITIN, AMIKACIN, MINOCYCLINE AND TETRACYCLINE SENSITIVITIES Performed at [facility] REFERRING CULTURE TO [facility] FOR AVYCAZ MIC    Report Status PENDING  Incomplete   Organism ID, Bacteria ACINETOBACTER CALCOACETICUS/BAUMANNII COMPLEX  Final      Susceptibility   Acinetobacter calcoaceticus/baumannii complex - MIC*    CEFTAZIDIME 16 INTERMEDIATE Intermediate     CEFTRIAXONE >=64 RESISTANT Resistant     CIPROFLOXACIN >=4 RESISTANT Resistant     GENTAMICIN >=16 RESISTANT Resistant     IMIPENEM >=16 RESISTANT Resistant     PIP/TAZO >=128 RESISTANT Resistant     TRIMETH/SULFA >=320 RESISTANT Resistant     AMPICILLIN/SULBACTAM 16 INTERMEDIATE Intermediate     * ACINETOBACTER CALCOACETICUS/BAUMANNII COMPLEX  Culture, blood (routine x 2)     Status: Abnormal   Collection Time: 05/27/15 12:07 PM  Result Value Ref Range Status   Specimen Description BLOOD RIGHT HAND  Final   Special Requests BOTTLES DRAWN AEROBIC ONLY 5CC  Final   Culture  Setup Time   Final    GRAM NEGATIVE COCCOBACILLI AEROBIC BOTTLE ONLY CRITICAL RESULT CALLED TO, READ BACK BY AND VERIFIED WITH: TO BSHEPARD (RN) BY TCLEVELAND AT 2:54AM CORRECTED RESULTS CALLED TO: L WILSON,RN AT 9811 05/28/15 BY L BENFIELD PREVIOUSLY REPORTED AS GRAM POSITIVE COCCI    Culture (A)  Final    ACINETOBACTER CALCOACETICUS/BAUMANNII COMPLEX SUSCEPTIBILITIES PERFORMED ON PREVIOUS CULTURE WITHIN THE LAST 5 DAYS.    Report Status 05/31/2015 FINAL  Final  Culture, blood (Routine X 2) w Reflex to ID Panel     Status: None   Collection Time: 05/29/15  9:43 AM  Result Value Ref Range Status   Specimen Description BLOOD LEFT ARM  Final   Special Requests IN PEDIATRIC BOTTLE  4CC  Final   Culture NO GROWTH 5 DAYS  Final   Report Status 06/03/2015 FINAL  Final  Culture, blood (Routine X 2) w Reflex to ID Panel  Status: None    Collection Time: 05/29/15  9:50 AM  Result Value Ref Range Status   Specimen Description BLOOD LEFT ARM  Final   Special Requests IN PEDIATRIC BOTTLE  4CC  Final   Culture NO GROWTH 5 DAYS  Final   Report Status 06/03/2015 FINAL  Final  Culture, blood (Routine X 2) w Reflex to ID Panel     Status: None (Preliminary result)   Collection Time: 06/01/15  3:36 PM  Result Value Ref Range Status   Specimen Description BLOOD RIGHT HAND  Final   Special Requests IN PEDIATRIC BOTTLE 4CC  Final   Culture NO GROWTH 3 DAYS  Final   Report Status PENDING  Incomplete  Culture, blood (Routine X 2) w Reflex to ID Panel     Status: None (Preliminary result)   Collection Time: 06/01/15  3:46 PM  Result Value Ref Range Status   Specimen Description BLOOD RIGHT ANTECUBITAL  Final   Special Requests IN PEDIATRIC BOTTLE 3CC  Final   Culture NO GROWTH 3 DAYS  Final   Report Status PENDING  Incomplete     Studies: No results found.  Scheduled Meds: . amiodarone  400 mg Oral BID  . antiseptic oral rinse  7 mL Mouth Rinse q12n4p  . ceftazidime avibactam (AVYCAZ) IVPB  1.25 g Intravenous Q8H  . chlorhexidine  15 mL Mouth Rinse BID  . feeding supplement (ENSURE ENLIVE)  237 mL Oral BID BM  . feeding supplement (PRO-STAT SUGAR FREE 64)  30 mL Oral BID  . levothyroxine  50 mcg Oral QAC breakfast  . pantoprazole  40 mg Oral Daily  . silver sulfADIAZINE   Topical Daily   Continuous Infusions:    Principal Problem:   MDR Acinetobacter baumannii infection Active Problems:   Morbid obesity (HCC)   Lymphedema of both lower extremities   Cellulitis and abscess of trunk   Sepsis (HCC)   Septic shock (HCC)   Acute respiratory failure (HCC)   Palliative care encounter   Goals of care, counseling/discussion   Acute renal failure with tubular necrosis (HCC)   Abscess   Encounter for nasogastric (NG) tube placement   Encounter for hospice care discussion   Encounter for central line  placement    CHIU, STEPHEN K  [facility] Pager [phone]. If 7PM-7AM, please contact night-coverage at [email or web address], password [email or web address] 06/05/2015, 12:24 PM  LOS: 13 days

## 2015-06-05 NOTE — Progress Notes (Signed)
INFECTIOUS DISEASE PROGRESS NOTE  ID: Michele Weber is a 45 y.o. female with  Principal Problem:   MDR Acinetobacter baumannii infection Active Problems:   Morbid obesity (HCC)   Lymphedema of both lower extremities   Cellulitis and abscess of trunk   Sepsis (HCC)   Septic shock (HCC)   Acute respiratory failure (HCC)   Palliative care encounter   Goals of care, counseling/discussion   Acute renal failure with tubular necrosis (HCC)   Abscess   Encounter for nasogastric (NG) tube placement   Encounter for hospice care discussion   Encounter for central line placement  Subjective: Without complaints  Abtx:  Anti-infectives    Start     Dose/Rate Route Frequency Ordered Stop   06/02/15 2100  Minocycline 100 mg in 0.9% Sodium Chloride  Status:  Discontinued     100 mL/hr  Intravenous Every 12 hours 06/02/15 1307 06/04/15 1052   06/01/15 2200  Minocycline HCl infusion 100 mg  Status:  Discontinued     100 mg 100 mL/hr over 60 Minutes Intravenous Every 12 hours 06/01/15 1546 06/02/15 1303   06/01/15 1000  Minocycline HCl infusion 100 mg  Status:  Discontinued     100 mg 4.2 mL/hr over 60 Minutes Intravenous Every 12 hours 06/01/15 0911 06/01/15 1546   06/01/15 1000  sodium chloride 0.9 % 1,000 mL with Minocycline HCl 100 mg infusion  Status:  Discontinued     4.2 mL/hr  Intravenous Every 12 hours 06/01/15 0926 06/01/15 0926   06/01/15 0930  ceftazidime-avibactam (AVYCAZ) 0.94 g in dextrose 5 % 50 mL IVPB  Status:  Discontinued     0.94 g 25 mL/hr over 2 Hours Intravenous Every 24 hours 06/01/15 0918 06/01/15 0923   05/31/15 1000  ceftazidime-avibactam (AVYCAZ) 1.25 g in dextrose 5 % 50 mL IVPB     1.25 g 25 mL/hr over 2 Hours Intravenous Every 8 hours 05/31/15 0914     05/31/15 0900  ceftazidime-avibactam (AVYCAZ) 2.5 g in dextrose 5 % 50 mL IVPB  Status:  Discontinued     2.5 g 25 mL/hr over 2 Hours Intravenous 3 times per day 05/31/15 0851 05/31/15 0857   05/30/15 1800   colistimethate (COLYMYCIN) 300 mg in sodium chloride 0.9 % 100 mL IVPB     300 mg 208 mL/hr over 30 Minutes Intravenous  Once 05/30/15 1716 05/30/15 1847   05/30/15 0405  vancomycin (VANCOCIN) 2,000 mg in sodium chloride 0.9 % 500 mL IVPB  Status:  Discontinued     2,000 mg 250 mL/hr over 120 Minutes Intravenous Every 48 hours 05/28/15 0842 05/28/15 1334   05/28/15 1700  meropenem (MERREM) 2 g in sodium chloride 0.9 % 100 mL IVPB  Status:  Discontinued     2 g 200 mL/hr over 30 Minutes Intravenous Every 12 hours 05/28/15 0841 05/31/15 0851   05/28/15 0400  vancomycin (VANCOCIN) 1,750 mg in sodium chloride 0.9 % 500 mL IVPB  Status:  Discontinued     1,750 mg 250 mL/hr over 120 Minutes Intravenous Every 24 hours 05/28/15 0330 05/28/15 0842   05/25/15 1700  meropenem (MERREM) 1 g in sodium chloride 0.9 % 100 mL IVPB  Status:  Discontinued     1 g 200 mL/hr over 30 Minutes Intravenous Every 12 hours 05/25/15 1255 05/28/15 0841   05/24/15 0945  meropenem (MERREM) 1 g in sodium chloride 0.9 % 100 mL IVPB  Status:  Discontinued     1 g 200 mL/hr over 30  Minutes Intravenous 3 times per day 05/24/15 0939 05/25/15 1255   05/23/15 2200  piperacillin-tazobactam (ZOSYN) IVPB 3.375 g  Status:  Discontinued     3.375 g 12.5 mL/hr over 240 Minutes Intravenous 3 times per day 05/23/15 1257 05/24/15 0939   05/23/15 1300  vancomycin (VANCOCIN) 2,500 mg in sodium chloride 0.9 % 500 mL IVPB     2,500 mg 250 mL/hr over 120 Minutes Intravenous  Once 05/23/15 1257 05/23/15 1656   05/23/15 1300  piperacillin-tazobactam (ZOSYN) IVPB 3.375 g     3.375 g 100 mL/hr over 30 Minutes Intravenous  Once 05/23/15 1257 05/23/15 1417      Medications:  Scheduled: . amiodarone  400 mg Oral BID  . antiseptic oral rinse  7 mL Mouth Rinse q12n4p  . ceftazidime avibactam (AVYCAZ) IVPB  1.25 g Intravenous Q8H  . chlorhexidine  15 mL Mouth Rinse BID  . feeding supplement (ENSURE ENLIVE)  237 mL Oral BID BM  . feeding  supplement (PRO-STAT SUGAR FREE 64)  30 mL Oral BID  . insulin aspart  0-9 Units Subcutaneous 6 times per day  . levothyroxine  50 mcg Oral QAC breakfast  . pantoprazole (PROTONIX) IV  40 mg Intravenous Q24H  . silver sulfADIAZINE   Topical Daily    Objective: Vital signs in last 24 hours: Temp:  [97.6 F (36.4 C)-98.7 F (37.1 C)] 97.6 F (36.4 C) (04/18 0747) Pulse Rate:  [109-126] 109 (04/18 0747) Resp:  [17-22] 20 (04/18 0747) BP: (94-124)/(60-72) 105/61 mmHg (04/18 0747) SpO2:  [94 %-100 %] 98 % (04/18 0747) Weight:  [244.489 kg (539 lb)] 244.489 kg (539 lb) (04/18 0458)   General appearance: alert and no distress Resp: clear to auscultation bilaterally Cardio: regular rate and rhythm GI: normal findings: bowel sounds normal and soft, non-tender Extremities: multiple wounds BLE, superficial. foul smelling.   L neck- IJ clean. Non-tender.   Lab Results  Recent Labs  06/04/15 0400 06/05/15 0525  WBC 16.1* 12.3*  HGB 9.5* 9.4*  HCT 29.5* 29.0*  NA 136 137  K 4.9 4.4  CL 99* 101  CO2 23 22  BUN 116* 120*  CREATININE 5.17* 4.80*   Liver Panel No results for input(s): PROT, ALBUMIN, AST, ALT, ALKPHOS, BILITOT, BILIDIR, IBILI in the last 72 hours. Sedimentation Rate No results for input(s): ESRSEDRATE in the last 72 hours. C-Reactive Protein No results for input(s): CRP in the last 72 hours.  Microbiology: Recent Results (from the past 240 hour(s))  Culture, blood (routine x 2)     Status: Abnormal (Preliminary result)   Collection Time: 05/27/15 12:01 PM  Result Value Ref Range Status   Specimen Description BLOOD CENTRAL LINE  Final   Special Requests BOTTLES DRAWN AEROBIC AND ANAEROBIC 10CC  Final   Culture  Setup Time   Final    GRAM NEGATIVE COCCOBACILLI IN BOTH AEROBIC AND ANAEROBIC BOTTLES CRITICAL RESULT CALLED TO, READ BACK BY AND VERIFIED WITH: TO BSHEPARD(RN) BY TCLEVELAND 05/28/2015 AT 2:52AM CORRECTED RESULTS CALLED TO: L WILSON,RN AT [id number] 05/28/15  BY L BENFIELD PREVIOUSLY REPORTED AS GRAM POSITIVE COCCI    Culture (A)  Final    ACINETOBACTER CALCOACETICUS/BAUMANNII COMPLEX MULTI-DRUG RESISTANT ORGANISM SEE SEPARATE REPORT FOR COLISITIN, AMIKACIN, MINOCYCLINE AND TETRACYCLINE SENSITIVITIES Performed at [facility] REFERRING CULTURE TO [facility] FOR AVYCAZ MIC    Report Status PENDING  Incomplete   Organism ID, Bacteria ACINETOBACTER CALCOACETICUS/BAUMANNII COMPLEX  Final      Susceptibility   Acinetobacter calcoaceticus/baumannii complex -  MIC*    CEFTAZIDIME 16 INTERMEDIATE Intermediate     CEFTRIAXONE >=64 RESISTANT Resistant     CIPROFLOXACIN >=4 RESISTANT Resistant     GENTAMICIN >=16 RESISTANT Resistant     IMIPENEM >=16 RESISTANT Resistant     PIP/TAZO >=128 RESISTANT Resistant     TRIMETH/SULFA >=320 RESISTANT Resistant     AMPICILLIN/SULBACTAM 16 INTERMEDIATE Intermediate     * ACINETOBACTER CALCOACETICUS/BAUMANNII COMPLEX  Culture, blood (routine x 2)     Status: Abnormal   Collection Time: 05/27/15 12:07 PM  Result Value Ref Range Status   Specimen Description BLOOD RIGHT HAND  Final   Special Requests BOTTLES DRAWN AEROBIC ONLY 5CC  Final   Culture  Setup Time   Final    GRAM NEGATIVE COCCOBACILLI AEROBIC BOTTLE ONLY CRITICAL RESULT CALLED TO, READ BACK BY AND VERIFIED WITH: TO BSHEPARD (RN) BY TCLEVELAND AT 2:54AM CORRECTED RESULTS CALLED TO: L WILSON,RN AT 1610 05/28/15 BY L BENFIELD PREVIOUSLY REPORTED AS GRAM POSITIVE COCCI    Culture (A)  Final    ACINETOBACTER CALCOACETICUS/BAUMANNII COMPLEX SUSCEPTIBILITIES PERFORMED ON PREVIOUS CULTURE WITHIN THE LAST 5 DAYS.    Report Status 05/31/2015 FINAL  Final  Culture, blood (Routine X 2) w Reflex to ID Panel     Status: None   Collection Time: 05/29/15  9:43 AM  Result Value Ref Range Status   Specimen Description BLOOD LEFT ARM  Final   Special Requests IN PEDIATRIC BOTTLE  4CC  Final   Culture NO GROWTH 5 DAYS  Final   Report Status 06/03/2015  FINAL  Final  Culture, blood (Routine X 2) w Reflex to ID Panel     Status: None   Collection Time: 05/29/15  9:50 AM  Result Value Ref Range Status   Specimen Description BLOOD LEFT ARM  Final   Special Requests IN PEDIATRIC BOTTLE  4CC  Final   Culture NO GROWTH 5 DAYS  Final   Report Status 06/03/2015 FINAL  Final  Culture, blood (Routine X 2) w Reflex to ID Panel     Status: None (Preliminary result)   Collection Time: 06/01/15  3:36 PM  Result Value Ref Range Status   Specimen Description BLOOD RIGHT HAND  Final   Special Requests IN PEDIATRIC BOTTLE 4CC  Final   Culture NO GROWTH 3 DAYS  Final   Report Status PENDING  Incomplete  Culture, blood (Routine X 2) w Reflex to ID Panel     Status: None (Preliminary result)   Collection Time: 06/01/15  3:46 PM  Result Value Ref Range Status   Specimen Description BLOOD RIGHT ANTECUBITAL  Final   Special Requests IN PEDIATRIC BOTTLE 3CC  Final   Culture NO GROWTH 3 DAYS  Final   Report Status PENDING  Incomplete    Studies/Results: No results found.   Assessment/Plan: MDR Acinetobacter bacteremia (S- colistin, amikacin; R-mino) avycaz sensi pending.  ARF BCx 4-11 and 4-14 are ngtd Morbid obesity Skin breakdown  Total days of antibiotics: 6 (avycaz 4-13),  Colistin 4-12 (single dose) Central line placed 4-13 (no other IV's)  Cr improved, my appreciation to Dr Hyman Hopes Repeat BCx are negative.  Aim for 14 days of avycaz (can stop sooner if resistant)         Michele Weber Infectious Diseases (pager) [phone] [email or web address] 06/05/2015, 10:08 AM  LOS: 13 days

## 2015-06-06 LAB — BASIC METABOLIC PANEL
Anion gap: 15 (ref 5–15)
BUN: 118 mg/dL — ABNORMAL HIGH (ref 6–20)
CHLORIDE: 103 mmol/L (ref 101–111)
CO2: 21 mmol/L — AB (ref 22–32)
CREATININE: 4.72 mg/dL — AB (ref 0.44–1.00)
Calcium: 8.8 mg/dL — ABNORMAL LOW (ref 8.9–10.3)
GFR calc non Af Amer: 10 mL/min — ABNORMAL LOW (ref >60)
GFR, EST AFRICAN AMERICAN: 12 mL/min — AB (ref >60)
Glucose, Bld: 100 mg/dL — ABNORMAL HIGH (ref 65–99)
POTASSIUM: 4.6 mmol/L (ref 3.5–5.1)
SODIUM: 139 mmol/L (ref 135–145)

## 2015-06-06 LAB — CBC
HEMATOCRIT: 28.7 % — AB (ref 36.0–46.0)
Hemoglobin: 9.2 g/dL — ABNORMAL LOW (ref 12.0–15.0)
MCH: 29.7 pg (ref 26.0–34.0)
MCHC: 32.1 g/dL (ref 30.0–36.0)
MCV: 92.6 fL (ref 78.0–100.0)
PLATELETS: 172 10*3/uL (ref 150–400)
RBC: 3.1 MIL/uL — AB (ref 3.87–5.11)
RDW: 15.6 % — ABNORMAL HIGH (ref 11.5–15.5)
WBC: 12.5 10*3/uL — AB (ref 4.0–10.5)

## 2015-06-06 LAB — CULTURE, BLOOD (ROUTINE X 2)
Culture: NO GROWTH
Culture: NO GROWTH

## 2015-06-06 LAB — GLUCOSE, CAPILLARY
Glucose-Capillary: 110 mg/dL — ABNORMAL HIGH (ref 65–99)
Glucose-Capillary: 91 mg/dL (ref 65–99)

## 2015-06-06 LAB — C DIFFICILE QUICK SCREEN W PCR REFLEX
C DIFFICILE (CDIFF) INTERP: NEGATIVE
C DIFFICILE (CDIFF) TOXIN: NEGATIVE
C DIFFICLE (CDIFF) ANTIGEN: NEGATIVE

## 2015-06-06 LAB — MISCELLANEOUS TEST

## 2015-06-06 NOTE — Progress Notes (Signed)
Patient cleaned and turned. Flexiseal out of rectum and seal intact on bed.  Patient has multiple wounds to bilateral legs and hips that are open, red, and weeping.  Xeroform gauze placed per order.  Patient also has several small open wounds to right buttock.  Patient also has foley catheter in place with foley and peri care completed.  Foley indicated per multiple open leg wounds.  Will continue to monitor.

## 2015-06-06 NOTE — Progress Notes (Signed)
INFECTIOUS DISEASE PROGRESS NOTE  ID: Michele Weber is a 45 y.o. female with  Principal Problem:   MDR Acinetobacter baumannii infection Active Problems:   Morbid obesity (HCC)   Lymphedema of both lower extremities   Cellulitis and abscess of trunk   Sepsis (HCC)   Septic shock (HCC)   Acute respiratory failure (HCC)   Palliative care encounter   Goals of care, counseling/discussion   Acute renal failure with tubular necrosis (HCC)   Abscess   Encounter for nasogastric (NG) tube placement   Encounter for hospice care discussion   Encounter for central line placement  Subjective: Without complaints  Abtx:  Anti-infectives    Start     Dose/Rate Route Frequency Ordered Stop   06/02/15 2100  Minocycline 100 mg in 0.9% Sodium Chloride  Status:  Discontinued     100 mL/hr  Intravenous Every 12 hours 06/02/15 1307 06/04/15 1052   06/01/15 2200  Minocycline HCl infusion 100 mg  Status:  Discontinued     100 mg 100 mL/hr over 60 Minutes Intravenous Every 12 hours 06/01/15 1546 06/02/15 1303   06/01/15 1000  Minocycline HCl infusion 100 mg  Status:  Discontinued     100 mg 4.2 mL/hr over 60 Minutes Intravenous Every 12 hours 06/01/15 0911 06/01/15 1546   06/01/15 1000  sodium chloride 0.9 % 1,000 mL with Minocycline HCl 100 mg infusion  Status:  Discontinued     4.2 mL/hr  Intravenous Every 12 hours 06/01/15 0926 06/01/15 0926   06/01/15 0930  ceftazidime-avibactam (AVYCAZ) 0.94 g in dextrose 5 % 50 mL IVPB  Status:  Discontinued     0.94 g 25 mL/hr over 2 Hours Intravenous Every 24 hours 06/01/15 0918 06/01/15 0923   05/31/15 1000  ceftazidime-avibactam (AVYCAZ) 1.25 g in dextrose 5 % 50 mL IVPB     1.25 g 25 mL/hr over 2 Hours Intravenous Every 8 hours 05/31/15 0914     05/31/15 0900  ceftazidime-avibactam (AVYCAZ) 2.5 g in dextrose 5 % 50 mL IVPB  Status:  Discontinued     2.5 g 25 mL/hr over 2 Hours Intravenous 3 times per day 05/31/15 0851 05/31/15 0857   05/30/15 1800   colistimethate (COLYMYCIN) 300 mg in sodium chloride 0.9 % 100 mL IVPB     300 mg 208 mL/hr over 30 Minutes Intravenous  Once 05/30/15 1716 05/30/15 1847   05/30/15 0405  vancomycin (VANCOCIN) 2,000 mg in sodium chloride 0.9 % 500 mL IVPB  Status:  Discontinued     2,000 mg 250 mL/hr over 120 Minutes Intravenous Every 48 hours 05/28/15 0842 05/28/15 1334   05/28/15 1700  meropenem (MERREM) 2 g in sodium chloride 0.9 % 100 mL IVPB  Status:  Discontinued     2 g 200 mL/hr over 30 Minutes Intravenous Every 12 hours 05/28/15 0841 05/31/15 0851   05/28/15 0400  vancomycin (VANCOCIN) 1,750 mg in sodium chloride 0.9 % 500 mL IVPB  Status:  Discontinued     1,750 mg 250 mL/hr over 120 Minutes Intravenous Every 24 hours 05/28/15 0330 05/28/15 0842   05/25/15 1700  meropenem (MERREM) 1 g in sodium chloride 0.9 % 100 mL IVPB  Status:  Discontinued     1 g 200 mL/hr over 30 Minutes Intravenous Every 12 hours 05/25/15 1255 05/28/15 0841   05/24/15 0945  meropenem (MERREM) 1 g in sodium chloride 0.9 % 100 mL IVPB  Status:  Discontinued     1 g 200 mL/hr over 30  Minutes Intravenous 3 times per day 05/24/15 0939 05/25/15 1255   05/23/15 2200  piperacillin-tazobactam (ZOSYN) IVPB 3.375 g  Status:  Discontinued     3.375 g 12.5 mL/hr over 240 Minutes Intravenous 3 times per day 05/23/15 1257 05/24/15 0939   05/23/15 1300  vancomycin (VANCOCIN) 2,500 mg in sodium chloride 0.9 % 500 mL IVPB     2,500 mg 250 mL/hr over 120 Minutes Intravenous  Once 05/23/15 1257 05/23/15 1656   05/23/15 1300  piperacillin-tazobactam (ZOSYN) IVPB 3.375 g     3.375 g 100 mL/hr over 30 Minutes Intravenous  Once 05/23/15 1257 05/23/15 1417      Medications:  Scheduled: . amiodarone  400 mg Oral BID  . antiseptic oral rinse  7 mL Mouth Rinse q12n4p  . chlorhexidine  15 mL Mouth Rinse BID  . feeding supplement (ENSURE ENLIVE)  237 mL Oral BID BM  . feeding supplement (PRO-STAT SUGAR FREE 64)  30 mL Oral BID  .  levothyroxine  50 mcg Oral QAC breakfast  . pantoprazole  40 mg Oral Daily  . silver sulfADIAZINE   Topical Daily    Objective: Vital signs in last 24 hours: Temp:  [98.4 F (36.9 C)-98.6 F (37 C)] 98.4 F (36.9 C) (04/19 1316) Pulse Rate:  [99-109] 99 (04/19 1316) Resp:  [20] 20 (04/19 1316) BP: (115-122)/(63-73) 115/73 mmHg (04/19 1316) SpO2:  [98 %-99 %] 99 % (04/19 1316) Weight:  [244.489 kg (539 lb)] 244.489 kg (539 lb) (04/19 0500)   General appearance: alert, cooperative and no distress Resp: clear to auscultation bilaterally Cardio: regular rate and rhythm GI: normal findings: bowel sounds normal and soft, non-tender Extremities: multiple wounds, dressed.   Lab Results  Recent Labs  06/05/15 0525 06/06/15 0518  WBC 12.3* 12.5*  HGB 9.4* 9.2*  HCT 29.0* 28.7*  NA 137 139  K 4.4 4.6  CL 101 103  CO2 22 21*  BUN 120* 118*  CREATININE 4.80* 4.72*   Liver Panel No results for input(s): PROT, ALBUMIN, AST, ALT, ALKPHOS, BILITOT, BILIDIR, IBILI in the last 72 hours. Sedimentation Rate No results for input(s): ESRSEDRATE in the last 72 hours. C-Reactive Protein No results for input(s): CRP in the last 72 hours.  Microbiology: Recent Results (from the past 240 hour(s))  Culture, blood (Routine X 2) w Reflex to ID Panel     Status: None   Collection Time: 05/29/15  9:43 AM  Result Value Ref Range Status   Specimen Description BLOOD LEFT ARM  Final   Special Requests IN PEDIATRIC BOTTLE  4CC  Final   Culture NO GROWTH 5 DAYS  Final   Report Status 06/03/2015 FINAL  Final  Culture, blood (Routine X 2) w Reflex to ID Panel     Status: None   Collection Time: 05/29/15  9:50 AM  Result Value Ref Range Status   Specimen Description BLOOD LEFT ARM  Final   Special Requests IN PEDIATRIC BOTTLE  4CC  Final   Culture NO GROWTH 5 DAYS  Final   Report Status 06/03/2015 FINAL  Final  Culture, blood (Routine X 2) w Reflex to ID Panel     Status: None   Collection  Time: 06/01/15  3:36 PM  Result Value Ref Range Status   Specimen Description BLOOD RIGHT HAND  Final   Special Requests IN PEDIATRIC BOTTLE 4CC  Final   Culture NO GROWTH 5 DAYS  Final   Report Status 06/06/2015 FINAL  Final  Culture, blood (Routine  X 2) w Reflex to ID Panel     Status: None   Collection Time: 06/01/15  3:46 PM  Result Value Ref Range Status   Specimen Description BLOOD RIGHT ANTECUBITAL  Final   Special Requests IN PEDIATRIC BOTTLE 3CC  Final   Culture NO GROWTH 5 DAYS  Final   Report Status 06/06/2015 FINAL  Final  C difficile quick scan w PCR reflex     Status: None   Collection Time: 06/06/15 12:11 PM  Result Value Ref Range Status   C Diff antigen NEGATIVE NEGATIVE Final   C Diff toxin NEGATIVE NEGATIVE Final   C Diff interpretation Negative for toxigenic C. difficile  Final    Studies/Results: No results found.   Assessment/Plan: MDR Acinetobacter bacteremia (S- colistin, amikacin; R-mino) avycaz sensi pending.  ARF BCx 4-11 and 4-14 are ngtd Morbid obesity Skin breakdown  Total days of antibiotics: 7 (avycaz 4-13),  Colistin 4-12 (single dose) Central line placed 4-13 (no other IV's)  Would stop avycaz Would watch off atbx Consider amikacin and mino dual therapy if further positive Cx.  Source control reheck BCx if she is symptomatic.  Available as needed.          Johny Sax Infectious Diseases (pager) [phone] [email or web address] 06/06/2015, 1:55 PM  LOS: 14 days

## 2015-06-06 NOTE — Progress Notes (Signed)
[facility] PROGRESS NOTE  Michele Weber ZOX:[id number] DOB: 1970-10-24 DOA: 05/23/2015 PCP: Angela Cox, MD  HPI/Brief narrative Morbidly obese 850lb pt who presented initially with AMS and hypotension with presenting sepsis with GNR bacteremia. Denies any complaints this morning.  Assessment/Plan: 1. Gm neg bacteremia (acinetobacter) with sepsis present on admission 1. WBC today 12.5  2. Appreciate input by ID. Had been on meropenem. We'll started on Avycaz as of 4/13. Pricilla Holm has been discontinued by ID. 3. Will observe without antibiotics. I did recommend to recheck blood cultures if patient become symptomatic and consider amikacin and minocycline therapy if further positive cultures. 4. Central line successfully replaced on 4/13 by PCCM 2. Morbid obesity 1. Stable thus far 2. Pt's baseline is essentially being non-ambulatory and laying/sitting in bed 3. Rec for SNF by PT/OT 3. Hypoxemic hypercapnic resp failure secondary to morbid obesity 1. Per Pulmonary, CPAP QHS and can make PRN BiPAP. No intubation 4. Afib with RVR 1. On amiodarone  bid 2. Pt was earlier given trial of metoprolol, however pt became hypotensive, thus d/c'd 5. Acute on CKD likely secondary to septic shock 1. Difficult to assess volume status given morbid obesity 2. Cr seems to be showing some improvement today with cr 4.72 3. Cont to try to cont to avoid nephrotoxic agents as possible 4. Nephrology has signed off. Recs to cont to monitor. Hopeful for continued improvement in renal function 6. Altered mental status 1. Much improved since admission 2. Pt axo x3, stable 7. Bulla ruptures 1. Noted in multiple areas under the patient 2. Do not appear infected at present with good granulation seen 3. Appreciate input by WOC. Recs noted 8. DVT prophylaxis 1. Continue SCD's for prophylaxis  Code Status: No intubation or chest compressions Family Communication: No family at bedside Disposition  Plan: SNF when renal function normalizes   Consultants:  ID  Critical Care  Palliative Care  Nephrology  Procedures:    Antibiotics: Anti-infectives    Start     Dose/Rate Route Frequency Ordered Stop   06/02/15 2100  Minocycline 100 mg in 0.9% Sodium Chloride  Status:  Discontinued     100 mL/hr  Intravenous Every 12 hours 06/02/15 1307 06/04/15 1052   06/01/15 2200  Minocycline HCl infusion 100 mg  Status:  Discontinued     100 mg 100 mL/hr over 60 Minutes Intravenous Every 12 hours 06/01/15 1546 06/02/15 1303   06/01/15 1000  Minocycline HCl infusion 100 mg  Status:  Discontinued     100 mg 4.2 mL/hr over 60 Minutes Intravenous Every 12 hours 06/01/15 0911 06/01/15 1546   06/01/15 1000  sodium chloride 0.9 % 1,000 mL with Minocycline HCl 100 mg infusion  Status:  Discontinued     4.2 mL/hr  Intravenous Every 12 hours 06/01/15 0926 06/01/15 0926   06/01/15 0930  ceftazidime-avibactam (AVYCAZ) 0.94 g in dextrose 5 % 50 mL IVPB  Status:  Discontinued     0.94 g 25 mL/hr over 2 Hours Intravenous Every 24 hours 06/01/15 0918 06/01/15 0923   05/31/15 1000  ceftazidime-avibactam (AVYCAZ) 1.25 g in dextrose 5 % 50 mL IVPB  Status:  Discontinued     1.25 g 25 mL/hr over 2 Hours Intravenous Every 8 hours 05/31/15 0914 06/06/15 1359   05/31/15 0900  ceftazidime-avibactam (AVYCAZ) 2.5 g in dextrose 5 % 50 mL IVPB  Status:  Discontinued     2.5 g 25 mL/hr over 2 Hours Intravenous 3 times per day 05/31/15 0851 05/31/15 0857  05/30/15 1800  colistimethate (COLYMYCIN) 300 mg in sodium chloride 0.9 % 100 mL IVPB     300 mg 208 mL/hr over 30 Minutes Intravenous  Once 05/30/15 1716 05/30/15 1847   05/30/15 0405  vancomycin (VANCOCIN) 2,000 mg in sodium chloride 0.9 % 500 mL IVPB  Status:  Discontinued     2,000 mg 250 mL/hr over 120 Minutes Intravenous Every 48 hours 05/28/15 0842 05/28/15 1334   05/28/15 1700  meropenem (MERREM) 2 g in sodium chloride 0.9 % 100 mL IVPB  Status:   Discontinued     2 g 200 mL/hr over 30 Minutes Intravenous Every 12 hours 05/28/15 0841 05/31/15 0851   05/28/15 0400  vancomycin (VANCOCIN) 1,750 mg in sodium chloride 0.9 % 500 mL IVPB  Status:  Discontinued     1,750 mg 250 mL/hr over 120 Minutes Intravenous Every 24 hours 05/28/15 0330 05/28/15 0842   05/25/15 1700  meropenem (MERREM) 1 g in sodium chloride 0.9 % 100 mL IVPB  Status:  Discontinued     1 g 200 mL/hr over 30 Minutes Intravenous Every 12 hours 05/25/15 1255 05/28/15 0841   05/24/15 0945  meropenem (MERREM) 1 g in sodium chloride 0.9 % 100 mL IVPB  Status:  Discontinued     1 g 200 mL/hr over 30 Minutes Intravenous 3 times per day 05/24/15 0939 05/25/15 1255   05/23/15 2200  piperacillin-tazobactam (ZOSYN) IVPB 3.375 g  Status:  Discontinued     3.375 g 12.5 mL/hr over 240 Minutes Intravenous 3 times per day 05/23/15 1257 05/24/15 0939   05/23/15 1300  vancomycin (VANCOCIN) 2,500 mg in sodium chloride 0.9 % 500 mL IVPB     2,500 mg 250 mL/hr over 120 Minutes Intravenous  Once 05/23/15 1257 05/23/15 1656   05/23/15 1300  piperacillin-tazobactam (ZOSYN) IVPB 3.375 g     3.375 g 100 mL/hr over 30 Minutes Intravenous  Once 05/23/15 1257 05/23/15 1417      HPI/Subjective: No complaints this AM  Objective: Filed Vitals:   06/05/15 2020 06/05/15 2030 06/06/15 0500 06/06/15 1316  BP: 122/63  115/64 115/73  Pulse: 109 106 108 99  Temp: 98.6 F (37 C)  98.5 F (36.9 C) 98.4 F (36.9 C)  TempSrc: Oral  Oral Oral  Resp: Height:      Weight:   244.489 kg (539 lb)   SpO2: 98%  98% 99%    Intake/Output Summary (Last 24 hours) at 06/06/15 1432 Last data filed at 06/06/15 1316  Gross per 24 hour  Intake    820 ml  Output   2850 ml  Net  -2030 ml   Filed Weights   06/04/15 0341 06/05/15 0458 06/06/15 0500  Weight: 299.374 kg (660 lb) 244.489 kg (539 lb) 244.489 kg (539 lb)    Exam:   General:  Awake, laying in bed  Cardiovascular: tachycardic, s1,  s2  Respiratory: normal resp effort, no wheezing  Abdomen: morbidly obese, nondistended, pos BS  Musculoskeletal: perfused, no clubbing  Data Reviewed: Basic Metabolic Panel:  Recent Labs Lab 06/02/15 0504 06/03/15 0659 06/04/15 0400 06/05/15 0525 06/06/15 0518  NA 137 135 136 137 139  K 4.7 4.6 4.9 4.4 4.6  CL 101 100* 99* 101 103  CO2 21*  GLUCOSE 107* 111* 110* 108* 100*  BUN 108* 115* 116* 120* 118*  CREATININE 4.84* 4.99* 5.17* 4.80* 4.72*  CALCIUM 8.2* 8.5* 8.6* 8.8* 8.8*   Liver Function Tests:  No results for input(s): AST, ALT, ALKPHOS, BILITOT, PROT, ALBUMIN in the last 168 hours. No results for input(s): LIPASE, AMYLASE in the last 168 hours. No results for input(s): AMMONIA in the last 168 hours. CBC:  Recent Labs Lab 06/02/15 0504 06/03/15 0659 06/04/15 0400 06/05/15 0525 06/06/15 0518  WBC 22.9* 20.9* 16.1* 12.3* 12.5*  HGB 9.5* 9.5* 9.5* 9.4* 9.2*  HCT 29.6* 29.0* 29.5* 29.0* 28.7*  MCV 91.9 93.5 92.5 92.4 92.6  PLT 150 175 167 169 172   Cardiac Enzymes:  Recent Labs Lab 06/03/15 0943  CKTOTAL 111   BNP (last 3 results) No results for input(s): BNP in the last 8760 hours.  ProBNP (last 3 results) No results for input(s): PROBNP in the last 8760 hours.  CBG:  Recent Labs Lab 06/05/15 1114 06/05/15 1629 06/05/15 2019 06/06/15 0038 06/06/15 0453  GLUCAP 100* 137* 107* 110* 91    Recent Results (from the past 240 hour(s))  Culture, blood (Routine X 2) w Reflex to ID Panel     Status: None   Collection Time: 05/29/15  9:43 AM  Result Value Ref Range Status   Specimen Description BLOOD LEFT ARM  Final   Special Requests IN PEDIATRIC BOTTLE  4CC  Final   Culture NO GROWTH 5 DAYS  Final   Report Status 06/03/2015 FINAL  Final  Culture, blood (Routine X 2) w Reflex to ID Panel     Status: None   Collection Time: 05/29/15  9:50 AM  Result Value Ref Range Status   Specimen Description BLOOD LEFT ARM  Final   Special  Requests IN PEDIATRIC BOTTLE  4CC  Final   Culture NO GROWTH 5 DAYS  Final   Report Status 06/03/2015 FINAL  Final  Culture, blood (Routine X 2) w Reflex to ID Panel     Status: None   Collection Time: 06/01/15  3:36 PM  Result Value Ref Range Status   Specimen Description BLOOD RIGHT HAND  Final   Special Requests IN PEDIATRIC BOTTLE 4CC  Final   Culture NO GROWTH 5 DAYS  Final   Report Status 06/06/2015 FINAL  Final  Culture, blood (Routine X 2) w Reflex to ID Panel     Status: None   Collection Time: 06/01/15  3:46 PM  Result Value Ref Range Status   Specimen Description BLOOD RIGHT ANTECUBITAL  Final   Special Requests IN PEDIATRIC BOTTLE 3CC  Final   Culture NO GROWTH 5 DAYS  Final   Report Status 06/06/2015 FINAL  Final  C difficile quick scan w PCR reflex     Status: None   Collection Time: 06/06/15 12:11 PM  Result Value Ref Range Status   C Diff antigen NEGATIVE NEGATIVE Final   C Diff toxin NEGATIVE NEGATIVE Final   C Diff interpretation Negative for toxigenic C. difficile  Final     Studies: No results found.  Scheduled Meds: . amiodarone  400 mg Oral BID  . antiseptic oral rinse  7 mL Mouth Rinse q12n4p  . chlorhexidine  15 mL Mouth Rinse BID  . feeding supplement (ENSURE ENLIVE)  237 mL Oral BID BM  . feeding supplement (PRO-STAT SUGAR FREE 64)  30 mL Oral BID  . levothyroxine  50 mcg Oral QAC breakfast  . pantoprazole  40 mg Oral Daily  . silver sulfADIAZINE   Topical Daily   Continuous Infusions:    Principal Problem:   MDR Acinetobacter baumannii infection Active Problems:   Morbid obesity (HCC)  Lymphedema of both lower extremities   Cellulitis and abscess of trunk   Sepsis (HCC)   Septic shock (HCC)   Acute respiratory failure (HCC)   Palliative care encounter   Goals of care, counseling/discussion   Acute renal failure with tubular necrosis (HCC)   Abscess   Encounter for nasogastric (NG) tube placement   Encounter for hospice care  discussion   Encounter for central line placement    [facility]  [facility] Pager [phone]. If 7PM-7AM, please contact night-coverage at [email or web address], password [facility] 06/06/2015, 2:32 PM  LOS: 14 days

## 2015-06-06 NOTE — Consult Note (Signed)
Subjective:  Feeling much better this morning. Asking about her leg wounds. No other complaints. Objective Vital signs in last 24 hours: Filed Vitals:   06/05/15 1120 06/05/15 2020 06/05/15 2030 06/06/15 0500  BP: 98/54 122/63  115/64  Pulse: 122 109 106 108  Temp: 97.4 F (36.3 C) 98.6 F (37 C)  98.5 F (36.9 C)  TempSrc: Oral Oral  Oral  Resp: 20 20  20   Height:      Weight:    539 lb (244.489 kg)  SpO2: 97% 98%  98%   Weight change: 0 lb (0 kg)  Intake/Output Summary (Last 24 hours) at 06/06/15 1130 Last data filed at 06/06/15 0840  Gross per 24 hour  Intake    817 ml  Output   1800 ml  Net   -983 ml    Assessment/ Plan: Pt is a 45 y.o. yo female who was admitted on 05/23/2015 with septic shock and bacteremia. Oliguric renal failure and 13L positive balance.  Assessment/Plan: 1. Acute kidney Injury in setting of sepsis and hypotension. Urine output improved overnight (2.2L). Cr has dropped from 4.80 to 4.72. No indications for dialysis at this time. 2. HTN -  Blood pressure soft: Monitor 3. Anemia - stable Hb at 9.2 4. Electrolytes and acid base stable  No evidence of uremia I would hope that renal function returns  [facility] Tiyona Desouza    Labs: Basic Metabolic Panel:  Recent Labs Lab 06/04/15 0400 06/05/15 0525 06/06/15 0518  NA 136 137 139  K 4.9 4.4 4.6  CL 99* 101 103  CO2 23 22 21*  GLUCOSE 110* 108* 100*  BUN 116* 120* 118*  CREATININE 5.17* 4.80* 4.72*  CALCIUM 8.6* 8.8* 8.8*   Liver Function Tests: No results for input(s): AST, ALT, ALKPHOS, BILITOT, PROT, ALBUMIN in the last 168 hours. No results for input(s): LIPASE, AMYLASE in the last 168 hours. No results for input(s): AMMONIA in the last 168 hours. CBC:  Recent Labs Lab 06/02/15 0504 06/03/15 0659 06/04/15 0400 06/05/15 0525 06/06/15 0518  WBC 22.9* 20.9* 16.1* 12.3* 12.5*  HGB 9.5* 9.5* 9.5* 9.4* 9.2*  HCT 29.6* 29.0* 29.5* 29.0* 28.7*  MCV 91.9 93.5 92.5 92.4 92.6  PLT 150 175 167 169  172   Cardiac Enzymes:  Recent Labs Lab 06/03/15 0943  CKTOTAL 111   CBG:  Recent Labs Lab 06/05/15 1114 06/05/15 1629 06/05/15 2019 06/06/15 0038 06/06/15 0453  GLUCAP 100* 137* 107* 110* 91    Iron Studies: No results for input(s): IRON, TIBC, TRANSFERRIN, FERRITIN in the last 72 hours. Studies/Results: No results found. Medications: Infusions:    Scheduled Medications: . amiodarone  400 mg Oral BID  . antiseptic oral rinse  7 mL Mouth Rinse q12n4p  . ceftazidime avibactam (AVYCAZ) IVPB  1.25 g Intravenous Q8H  . chlorhexidine  15 mL Mouth Rinse BID  . feeding supplement (ENSURE ENLIVE)  237 mL Oral BID BM  . feeding supplement (PRO-STAT SUGAR FREE 64)  30 mL Oral BID  . levothyroxine  50 mcg Oral QAC breakfast  . pantoprazole  40 mg Oral Daily  . silver sulfADIAZINE   Topical Daily    have reviewed scheduled and prn medications.  Physical Exam: General: obese AA female laying in bed in NAD. Speaking in complete sentences. Heart: RRR in murmur Lungs: CTAB Abdomen: obese, SNTND Extremities: pulses intact bilaterally, no edema appreciated, substantial superficial wounds bilaterally.   06/06/2015,11:30 AM  LOS: 14 days

## 2015-06-06 NOTE — Progress Notes (Signed)
Pharmacy Antibiotic Note  [name] is a 45 y.o. female admitted on 05/23/2015 with sepsis due to MDR acinetobacter bacteremia.  Pharmacy has been consulted for ceftazidime-avibactam dosing.   Avycaz D#7/14 for MDR Acinetobacter bacteremia and cellulitis. ID plans for 14 day course [facility]. Has extensive LLE wounds that significantly worsened.  Holding off on further colistin to preserve renal fxn and awaiting susceptibilities to other abx.  Afebrile, WBC wnl  Plan: Continue Ceftazidime-avibactam 1.25 gm IV Q8H (No obesity data, using high dose instead of adjusting for renal fxn) Monitor clinical picture, renal function F/U blood cx, Avycaz susceptibilities  Height: 5' (152.4 cm) Weight: (!) 539 lb (244.489 kg) IBW/kg (Calculated) : 45.5  Temp (24hrs), Avg:98.6 F (37 C), Min:98.5 F (36.9 C), Max:98.6 F (37 C)   Recent Labs Lab 06/02/15 0504 06/02/15 0943 06/03/15 0659 06/04/15 0400 06/05/15 0525 06/06/15 0518  WBC 22.9*  --  20.9* 16.1* 12.3* 12.5*  CREATININE 4.84*  --  4.99* 5.17* 4.80* 4.72*  LATICACIDVEN  --  1.3  --   --   --   --     Estimated Creatinine Clearance: 29.7 mL/min (by C-G formula based on Cr of 4.72).    No Known Allergies  Antimicrobials this admission: Levaquin PTA Vanc 4/4 >> 4/7 Zosyn 4/4 >> 4/6 Meropenem 4/6 >> 4/14 Colistin x 1 4/12 Ceftazidime-avibactam 4/13 >> Minocycline 4/14 >>  Dose adjustments this admission: 4/6 VR = 34  Microbiology results: Blood [facility] Salvia([place]) - Acinetobacter (sensitive Zosyn, Merrem)  Abscess cx ([place]) - negative  4/5 BCx x2 - Acinetobacter (sensitive Unasyn, Primaxin, Zosyn)  4/5 UCx - negative  4/5 left thigh wound - Acinetobacter (sensitive Unasyn, Fortaz, Primaxin) 4/9 BCx: Acinetobacter - (sensitive to colistin and amikacin, av[facility]az susceptibility [facility]) 4/11 BCx: NGTD 4/14 Blood cx: sent  Thank you for allowing pharmacy to  be a part of this patient's care.  [name], PharmD, BCPS Clinical Pharmacist Pager [phone] 06/06/2015 1:01 PM

## 2015-06-07 DIAGNOSIS — A499 Bacterial infection, unspecified: Secondary | ICD-10-CM

## 2015-06-07 DIAGNOSIS — L03319 Cellulitis of trunk, unspecified: Secondary | ICD-10-CM

## 2015-06-07 DIAGNOSIS — L02219 Cutaneous abscess of trunk, unspecified: Secondary | ICD-10-CM

## 2015-06-07 LAB — BASIC METABOLIC PANEL
Anion gap: 15 (ref 5–15)
BUN: 111 mg/dL — AB (ref 6–20)
CHLORIDE: 104 mmol/L (ref 101–111)
CO2: 22 mmol/L (ref 22–32)
CREATININE: 4.55 mg/dL — AB (ref 0.44–1.00)
Calcium: 8.6 mg/dL — ABNORMAL LOW (ref 8.9–10.3)
GFR calc Af Amer: 12 mL/min — ABNORMAL LOW (ref >60)
GFR calc non Af Amer: 11 mL/min — ABNORMAL LOW (ref >60)
GLUCOSE: 109 mg/dL — AB (ref 65–99)
POTASSIUM: 4.3 mmol/L (ref 3.5–5.1)
SODIUM: 141 mmol/L (ref 135–145)

## 2015-06-07 LAB — CBC
HEMATOCRIT: 27.9 % — AB (ref 36.0–46.0)
Hemoglobin: 8.8 g/dL — ABNORMAL LOW (ref 12.0–15.0)
MCH: 29.3 pg (ref 26.0–34.0)
MCHC: 31.5 g/dL (ref 30.0–36.0)
MCV: 93 fL (ref 78.0–100.0)
PLATELETS: 157 10*3/uL (ref 150–400)
RBC: 3 MIL/uL — ABNORMAL LOW (ref 3.87–5.11)
RDW: 15.8 % — AB (ref 11.5–15.5)
WBC: 10.1 10*3/uL (ref 4.0–10.5)

## 2015-06-07 MED ORDER — ENSURE ENLIVE PO LIQD: 237.0000 mL | Freq: Every day | ORAL | Status: DC | PRN

## 2015-06-07 MED ORDER — ADULT MULTIVITAMIN W/MINERALS CH
1.0000 | ORAL_TABLET | Freq: Every day | ORAL | Status: DC
  Administered 2015-06-07 – 2015-06-08 (×2): 1 via ORAL
  Filled 2015-06-07: qty 1

## 2015-06-07 NOTE — Progress Notes (Signed)
[facility] PROGRESS NOTE  Michele MowersSharon Weber AVW:[id number] DOB: [birth year] DOA: 05/23/2015 PCP: Angela Coxasanayaka, Gayani Y, MD  HPI/Brief narrative Morbidly obese 850lb pt who presented initially with AMS and hypotension with presenting sepsis with GNR bacteremia. Denies any complaints this morning.  Assessment/Plan: 1. Gm neg bacteremia (acinetobacter) with sepsis present on admission 1. WBC today 10.1 2. Appreciate input by ID. Had been on meropenem. We'll started on Avycaz as of 4/13. Pricilla Holmvycaz has been discontinued by ID. 3. Will observe without antibiotics. I did recommend to recheck blood cultures if patient become symptomatic and consider amikacin and minocycline therapy if further positive cultures. 4. Central line successfully replaced on 4/13 by PCCM 2. Morbid obesity 1. Stable thus far 2. Pt's baseline is essentially being non-ambulatory and laying/sitting in bed 3. Rec for SNF by PT/OT 3. Hypoxemic hypercapnic resp failure secondary to morbid obesity 1. Per Pulmonary, CPAP QHS and can make PRN BiPAP. No intubation 4. Afib with RVR 1. On amiodarone 400mg  bid 2. Pt was earlier given trial of metoprolol, however pt became hypotensive, thus d/c'd 5. Acute on CKD likely secondary to septic shock 1. Difficult to assess volume status given morbid obesity 2. Cr seems to be showing some improvement today with cr 4.72--4.55 3. Cont to try to cont to avoid nephrotoxic agents as possible 4. Nephrology has signed off. Recs to cont to monitor. Hopeful for continued improvement in renal function 6. Altered mental status 1. Much improved since admission 2. Pt axo x3, stable 7. Bulla ruptures 1. Noted in multiple areas under the patient 2. Do not appear infected at present with good granulation seen 3. Appreciate input by WOC. Recs noted 8. DVT prophylaxis 1. Continue SCD's for prophylaxis  Code Status: No intubation or chest compressions Family Communication: No family at  bedside Disposition Plan: SNF when renal function normalizes   Consultants:  ID  Critical Care  Palliative Care  Nephrology  Procedures:    Antibiotics: Anti-infectives    Start     Dose/Rate Route Frequency Ordered Stop   06/02/15 2100  Minocycline 100 mg in 0.9% Sodium Chloride  Status:  Discontinued     100 mL/hr  Intravenous Every 12 hours 06/02/15 1307 06/04/15 1052   06/01/15 2200  Minocycline HCl infusion 100 mg  Status:  Discontinued     100 mg 100 mL/hr over 60 Minutes Intravenous Every 12 hours 06/01/15 1546 06/02/15 1303   06/01/15 1000  Minocycline HCl infusion 100 mg  Status:  Discontinued     100 mg 4.2 mL/hr over 60 Minutes Intravenous Every 12 hours 06/01/15 0911 06/01/15 1546   06/01/15 1000  sodium chloride 0.9 % 1,000 mL with Minocycline HCl 100 mg infusion  Status:  Discontinued     4.2 mL/hr  Intravenous Every 12 hours 06/01/15 0926 06/01/15 0926   06/01/15 0930  ceftazidime-avibactam (AVYCAZ) 0.94 g in dextrose 5 % 50 mL IVPB  Status:  Discontinued     0.94 g 25 mL/hr over 2 Hours Intravenous Every 24 hours 06/01/15 0918 06/01/15 0923   05/31/15 1000  ceftazidime-avibactam (AVYCAZ) 1.25 g in dextrose 5 % 50 mL IVPB  Status:  Discontinued     1.25 g 25 mL/hr over 2 Hours Intravenous Every 8 hours 05/31/15 0914 06/06/15 1359   05/31/15 0900  ceftazidime-avibactam (AVYCAZ) 2.5 g in dextrose 5 % 50 mL IVPB  Status:  Discontinued     2.5 g 25 mL/hr over 2 Hours Intravenous 3 times per day 05/31/15 0851 05/31/15 0857  05/30/15 1800  colistimethate (COLYMYCIN) 300 mg in sodium chloride 0.9 % 100 mL IVPB     300 mg 208 mL/hr over 30 Minutes Intravenous  Once 05/30/15 1716 05/30/15 1847   05/30/15 0405  vancomycin (VANCOCIN) 2,000 mg in sodium chloride 0.9 % 500 mL IVPB  Status:  Discontinued     2,000 mg 250 mL/hr over 120 Minutes Intravenous Every 48 hours 05/28/15 0842 05/28/15 1334   05/28/15 1700  meropenem (MERREM) 2 g in sodium chloride 0.9 % 100 mL  IVPB  Status:  Discontinued     2 g 200 mL/hr over 30 Minutes Intravenous Every 12 hours 05/28/15 0841 05/31/15 0851   05/28/15 0400  vancomycin (VANCOCIN) 1,750 mg in sodium chloride 0.9 % 500 mL IVPB  Status:  Discontinued     1,750 mg 250 mL/hr over 120 Minutes Intravenous Every 24 hours 05/28/15 0330 05/28/15 0842   05/25/15 1700  meropenem (MERREM) 1 g in sodium chloride 0.9 % 100 mL IVPB  Status:  Discontinued     1 g 200 mL/hr over 30 Minutes Intravenous Every 12 hours 05/25/15 1255 05/28/15 0841   05/24/15 0945  meropenem (MERREM) 1 g in sodium chloride 0.9 % 100 mL IVPB  Status:  Discontinued     1 g 200 mL/hr over 30 Minutes Intravenous 3 times per day 05/24/15 0939 05/25/15 1255   05/23/15 2200  piperacillin-tazobactam (ZOSYN) IVPB 3.375 g  Status:  Discontinued     3.375 g 12.5 mL/hr over 240 Minutes Intravenous 3 times per day 05/23/15 1257 05/24/15 0939   05/23/15 1300  vancomycin (VANCOCIN) 2,500 mg in sodium chloride 0.9 % 500 mL IVPB     2,500 mg 250 mL/hr over 120 Minutes Intravenous  Once 05/23/15 1257 05/23/15 1656   05/23/15 1300  piperacillin-tazobactam (ZOSYN) IVPB 3.375 g     3.375 g 100 mL/hr over 30 Minutes Intravenous  Once 05/23/15 1257 05/23/15 1417      HPI/Subjective: Patient seen and examined, denies any complaints this a.m.  Objective: Filed Vitals:   06/06/15 2041 06/07/15 0533 06/07/15 0800 06/07/15 0900  BP: 103/54 103/57 104/46 90/45  Pulse: 113 120 126 130  Temp: 98.5 F (36.9 C) 98.3 F (36.8 C) 98.5 F (36.9 C) 98.6 F (37 C)  TempSrc: Oral Oral Oral Oral  Resp: Height:      Weight:  246.303 kg (543 lb)    SpO2: 98% 97% 97% 100%    Intake/Output Summary (Last 24 hours) at 06/07/15 1132 Last data filed at 06/07/15 1100  Gross per 24 hour  Intake   1440 ml  Output   2300 ml  Net   -860 ml   Filed Weights   06/05/15 0458 06/06/15 0500 06/07/15 0533  Weight: 244.489 kg (539 lb) 244.489 kg (539 lb) 246.303 kg (543  lb)    Exam:   General:  Awake, laying in bed  Cardiovascular: tachycardic, s1, s2  Respiratory: normal resp effort, no wheezing  Abdomen: morbidly obese, nondistended, pos BS  Musculoskeletal: perfused, no clubbing  Data Reviewed: Basic Metabolic Panel:  Recent Labs Lab 06/03/15 0659 06/04/15 0400 06/05/15 0525 06/06/15 0518 06/07/15 0313  NA 135 136 137 139 141  K 4.6 4.9 4.4 4.6 4.3  CL 100* 99* 101 103 104  CO2 21* 22  GLUCOSE 111* 834* 108* 100* 109*  BUN 115* 116* 120* 118* 111*  CREATININE 4.99* 5.17* 4.80* 4.72* 4.55*  CALCIUM 8.5* 8.6*  8.8* 8.8* 8.6*   CBC:  Recent Labs Lab 06/03/15 0659 06/04/15 0400 06/05/15 0525 06/06/15 0518 06/07/15 0313  WBC 20.9* 16.1* 12.3* 12.5* 10.1  HGB 9.5* 9.5* 9.4* 9.2* 8.8*  HCT 29.0* 29.5* 29.0* 28.7* 27.9*  MCV 93.5 92.5 92.4 92.6 93.0  PLT 175 167 169 172 157   Cardiac Enzymes:  Recent Labs Lab 06/03/15 0943  CKTOTAL 111   BNP (last 3 results) No results for input(s): BNP in the last 8760 hours.  ProBNP (last 3 results) No results for input(s): PROBNP in the last 8760 hours.  CBG:  Recent Labs Lab 06/05/15 1114 06/05/15 1629 06/05/15 2019 06/06/15 0038 06/06/15 0453  GLUCAP 100* 137* 107* 110* 91    Recent Results (from the past 240 hour(s))  Culture, blood (Routine X 2) w Reflex to ID Panel     Status: None   Collection Time: 05/29/15  9:43 AM  Result Value Ref Range Status   Specimen Description BLOOD LEFT ARM  Final   Special Requests IN PEDIATRIC BOTTLE  4CC  Final   Culture NO GROWTH 5 DAYS  Final   Report Status 06/03/2015 FINAL  Final  Culture, blood (Routine X 2) w Reflex to ID Panel     Status: None   Collection Time: 05/29/15  9:50 AM  Result Value Ref Range Status   Specimen Description BLOOD LEFT ARM  Final   Special Requests IN PEDIATRIC BOTTLE  4CC  Final   Culture NO GROWTH 5 DAYS  Final   Report Status 06/03/2015 FINAL  Final  Culture, blood (Routine X 2) w  Reflex to ID Panel     Status: None   Collection Time: 06/01/15  3:36 PM  Result Value Ref Range Status   Specimen Description BLOOD RIGHT HAND  Final   Special Requests IN PEDIATRIC BOTTLE 4CC  Final   Culture NO GROWTH 5 DAYS  Final   Report Status 06/06/2015 FINAL  Final  Culture, blood (Routine X 2) w Reflex to ID Panel     Status: None   Collection Time: 06/01/15  3:46 PM  Result Value Ref Range Status   Specimen Description BLOOD RIGHT ANTECUBITAL  Final   Special Requests IN PEDIATRIC BOTTLE 3CC  Final   Culture NO GROWTH 5 DAYS  Final   Report Status 06/06/2015 FINAL  Final  C difficile quick scan w PCR reflex     Status: None   Collection Time: 06/06/15 12:11 PM  Result Value Ref Range Status   C Diff antigen NEGATIVE NEGATIVE Final   C Diff toxin NEGATIVE NEGATIVE Final   C Diff interpretation Negative for toxigenic C. difficile  Final     Studies: No results found.  Scheduled Meds: . amiodarone  400 mg Oral BID  . antiseptic oral rinse  7 mL Mouth Rinse q12n4p  . chlorhexidine  15 mL Mouth Rinse BID  . feeding supplement (ENSURE ENLIVE)  237 mL Oral BID BM  . feeding supplement (PRO-STAT SUGAR FREE 64)  30 mL Oral BID  . levothyroxine  50 mcg Oral QAC breakfast  . pantoprazole  40 mg Oral Daily  . silver sulfADIAZINE   Topical Daily   Continuous Infusions:    Principal Problem:   MDR Acinetobacter baumannii infection Active Problems:   Morbid obesity (HCC)   Lymphedema of both lower extremities   Cellulitis and abscess of trunk   Sepsis (HCC)   Septic shock (HCC)   Acute respiratory failure (HCC)   Palliative  care encounter   Goals of care, counseling/discussion   Acute renal failure with tubular necrosis (HCC)   Abscess   Encounter for nasogastric (NG) tube placement   Encounter for hospice care discussion   Encounter for central line placement    [facility]  [facility] Pager [phone]. If 7PM-7AM, please contact night-coverage at  [email or web address], password [facility] 06/07/2015, 11:32 AM  LOS: 15 days

## 2015-06-07 NOTE — Progress Notes (Signed)
Nutrition Follow-up  DOCUMENTATION CODES:   Morbid obesity  INTERVENTION:  Continue Pro-stat BID, each dose provides 100 kcal and 15 grams of protein Provide Multivitamin with minerals daily Change Ensure Enlive to PRN Discussed diet tips to help prevent diarrhea  NUTRITION DIAGNOSIS:   Inadequate oral intake related to dysphagia as evidenced by  (meal completion 20-50%).  Ongoing/improving  GOAL:   Patient will meet greater than or equal to 90% of their needs  Unmet  MONITOR:   PO intake, Supplement acceptance, Labs, Weight trends, I & O's  REASON FOR ASSESSMENT:   Rounds (start TF after Cortrak tube placed)    ASSESSMENT:   [name] who [facility] withAMS, hypotension , lactic acidosis, elevated pro calcitonin, BC with GNR, draining pannus wounds. She was treated with abx and promptly transferred to [facility] on 4/5.   Pt feels that her appetite is improving and she is slowly starting to eat better. Per nursing notes, pt was previously eating 20-50% of meals and she is now eating 50-85% of meals. She reports drinking Ensure a couple days ago, but not yesterday or today. She continues to take pro-stat well. She states that she is not eating more because food is going right through her. RD provided diet suggestions to help with diarrhea.  She states that her normal weight is between 500 and 550 lbs. She wants to get better so that she can have gastric bypass surgery. She states she is being discharged to nursing facility in [place] tomorrow. RD encouraged pt to continue taking Pro-stat 2-3 times per day and take a daily Multivitamin with minerals.   Labs: BUN 111 (high), Creatinine 4.55 (high), hemoglobin 8.8 (low)  Diet Order:  DIET DYS 3 Room service appropriate?: Yes; Fluid consistency:: Thin  Skin:  Wound (see comment) (L and R leg cellulitis, open L leg and L hip wounds)  Last BM:  4/19  Height:   Ht Readings from Last 1  Encounters:  06/04/15 5' (1.524 m)    Weight:   Wt Readings from Last 1 Encounters:  06/07/15 543 lb (246.303 kg)    Ideal Body Weight:  59.1 kg  BMI:  Body mass index is 106.05 kg/(m^2).  Estimated Nutritional Needs:   Kcal:  1700-1900  Protein:  115-130 grams  Fluid:  2.6 L/day  EDUCATION NEEDS:   No education needs identified at this time  Michele Weber RD, LDN Inpatient Clinical Dietitian Pager: [phone] After Hours Pager: [phone]

## 2015-06-07 NOTE — Progress Notes (Signed)
Pt premedicated with Fentanyl for turning in bed and use of bedpan. This RN and several staff returned at 5:10 AM. Pt declined to attempt bedpan at this time, stating she wanted to go back to sleep using her CPAP. Pt stated she will attempt bowel movement sometime during 1st shift. Will continue to monitor.

## 2015-06-07 NOTE — Progress Notes (Signed)
Pt with questions about use of Collagen for wound healing. Pt states she was given this in a prior admission and it was effective. Pt encouraged to ask the rounding team MDs in the AM. Will pass on to day shift RN and continue to monitor.

## 2015-06-07 NOTE — Progress Notes (Signed)
Patient has CPAP bedside and ready for use.  Patient stated she will place on herself when ready for bed and does not need any assistance.

## 2015-06-07 NOTE — Clinical Social Work Note (Signed)
Patient's daughters wanted to speak with CSW about discharge. They reported that they heard she would be discharging tomorrow. Told them that she still had a bed at the SNF but they needed measurements for her wheelchair, per Patton SallesKathy Campbell at [facility]. CSW asked staff about getting measurements and was told that she used a regular bariatric wheelchair. CSW called Ms. Orvan FalconerCampbell and provided her with this information.   Charlynn CourtSarah Kou Gucciardo, CSW [phone]

## 2015-06-08 LAB — BASIC METABOLIC PANEL
ANION GAP: 11 (ref 5–15)
BUN: 106 mg/dL — ABNORMAL HIGH (ref 6–20)
CALCIUM: 8.6 mg/dL — AB (ref 8.9–10.3)
CHLORIDE: 106 mmol/L (ref 101–111)
CO2: 24 mmol/L (ref 22–32)
CREATININE: 4.28 mg/dL — AB (ref 0.44–1.00)
GFR calc non Af Amer: 12 mL/min — ABNORMAL LOW (ref >60)
GFR, EST AFRICAN AMERICAN: 13 mL/min — AB (ref >60)
Glucose, Bld: 105 mg/dL — ABNORMAL HIGH (ref 65–99)
Potassium: 3.8 mmol/L (ref 3.5–5.1)
SODIUM: 141 mmol/L (ref 135–145)

## 2015-06-08 LAB — PROTIME-INR
INR: 1.25 (ref 0.00–1.49)
PROTHROMBIN TIME: 15.9 s — AB (ref 11.6–15.2)

## 2015-06-08 MED ORDER — ONDANSETRON HCL 4 MG PO TABS: 4.0000 mg | ORAL_TABLET | Freq: Three times a day (TID) | ORAL | Status: DC | PRN

## 2015-06-08 MED ORDER — LORATADINE 10 MG PO TABS: 10.0000 mg | ORAL_TABLET | Freq: Every day | ORAL | Status: DC

## 2015-06-08 MED ORDER — RIVAROXABAN 20 MG PO TABS: 20.0000 mg | ORAL_TABLET | Freq: Every day | ORAL | Status: DC

## 2015-06-08 MED ORDER — WARFARIN - PHARMACIST DOSING INPATIENT
Freq: Every day | Status: DC
  Administered 2015-06-08: 18:00:00

## 2015-06-08 MED ORDER — WARFARIN VIDEO
Freq: Once | Status: AC
  Administered 2015-06-08: 12:00:00

## 2015-06-08 MED ORDER — SILVER SULFADIAZINE 1 % EX CREA: TOPICAL_CREAM | Freq: Every day | CUTANEOUS | Status: DC

## 2015-06-08 MED ORDER — WARFARIN SODIUM 5 MG PO TABS: 5.0000 mg | ORAL_TABLET | Freq: Every day | ORAL | Status: DC

## 2015-06-08 MED ORDER — COUMADIN BOOK
Freq: Once | Status: AC
  Administered 2015-06-08: 12:00:00
  Filled 2015-06-08 (×2): qty 1

## 2015-06-08 MED ORDER — WARFARIN SODIUM 7.5 MG PO TABS
7.5000 mg | ORAL_TABLET | Freq: Once | ORAL | Status: AC
Start: 2015-06-08 — End: 2015-06-08
  Administered 2015-06-08: 7.5 mg via ORAL
  Filled 2015-06-08: qty 1

## 2015-06-08 MED ORDER — LORATADINE 10 MG PO TABS
10.0000 mg | ORAL_TABLET | Freq: Every day | ORAL | Status: DC
  Administered 2015-06-08: 10 mg via ORAL
  Filled 2015-06-08: qty 1

## 2015-06-08 MED ORDER — PANTOPRAZOLE SODIUM 40 MG PO TBEC: 40.0000 mg | DELAYED_RELEASE_TABLET | Freq: Every day | ORAL | Status: AC

## 2015-06-08 MED ORDER — ONDANSETRON HCL 4 MG/2ML IJ SOLN
4.0000 mg | Freq: Four times a day (QID) | INTRAMUSCULAR | Status: DC | PRN
  Filled 2015-06-08: qty 2

## 2015-06-08 MED ORDER — HYDROCODONE-ACETAMINOPHEN 5-300 MG PO TABS: 1.0000 | ORAL_TABLET | Freq: Four times a day (QID) | ORAL | Status: DC | PRN

## 2015-06-08 MED ORDER — METOPROLOL TARTRATE 12.5 MG HALF TABLET
12.5000 mg | ORAL_TABLET | Freq: Two times a day (BID) | ORAL | Status: DC
  Administered 2015-06-08: 12.5 mg via ORAL
  Filled 2015-06-08: qty 1

## 2015-06-08 NOTE — Clinical Social Work Note (Signed)
CSW facilitated patient discharge including contacting patient family and facility to confirm patient discharge plans. CSW was unable to contact patient's daughter over the phone but her daughter was available by phone when CSW went to the room to confirm discharge details. Clinical information faxed to facility and family agreeable with plan. CSW arranged ambulance transport via [facility] to [facility]. RN to call report prior to discharge.  CSW will sign off for now as social work intervention is no longer needed. Please consult us again if new needs arise.  Charlynn CourtSarah Blimie Vaness, CSW [phone]

## 2015-06-08 NOTE — Progress Notes (Signed)
DNR status clarified by Dr Sharl Malama . Regarded as full code as discussed with pt

## 2015-06-08 NOTE — Progress Notes (Signed)
ANTICOAGULATION CONSULT NOTE - Initial Consult  Pharmacy Consult for warfarin Indication: atrial fibrillation  No Known Allergies  Patient Measurements: Height: 5' (152.4 cm) Weight: (!) 553 lb (250.839 kg) IBW/kg (Calculated) : 45.5 Heparin Dosing Weight:   Vital Signs: Temp: 97.6 F (36.4 C) (04/21 0628) Temp Source: Oral (04/21 0628) BP: 116/76 mmHg (04/21 0628) Pulse Rate: 122 (04/21 0628)  Labs:  Recent Labs  06/06/15 0518 06/07/15 0313 06/08/15 0345  HGB 9.2* 8.8*  --   HCT 28.7* 27.9*  --   PLT 172 157  --   CREATININE 4.72* 4.55* 4.28*    Estimated Creatinine Clearance: 33.4 mL/min (by C-G formula based on Cr of 4.28).   Medical History: Past Medical History  Diagnosis Date  . Morbid obesity (HCC)   . Lymphedema of both lower extremities   . Diastolic CHF (HCC)   . Vitamin D deficiency   . Atrial fibrillation (HCC)   . Vitamin D deficiency   . Sepsis (HCC) 05/2015  . Anemia     unspecified  . Cellulitis and abscess     bilateral legs    Medications:  Prescriptions prior to admission  Medication Sig Dispense Refill Last Dose  . acetaminophen (TYLENOL) 325 MG tablet Take 650 mg by mouth every 8 (eight) hours. scheduled   05/21/2015  . acetaminophen (TYLENOL) 500 MG tablet Take 1,000 mg by mouth every 8 (eight) hours as needed (pain).   05/22/2015 at am  . albuterol (PROVENTIL HFA;VENTOLIN HFA) 108 (90 Base) MCG/ACT inhaler Inhale 2 puffs into the lungs every 6 (six) hours as needed for wheezing or shortness of breath.   unknown  . Amino Acids-Protein Hydrolys (FEEDING SUPPLEMENT, PRO-STAT SUGAR FREE 64,) LIQD Take 30 mLs by mouth 3 (three) times daily with meals. (Patient taking differently: Take 30 mLs by mouth 3 (three) times daily. 8am 5pm, 8pm) 900 mL 0 05/21/2015 at 2000  . amiodarone (PACERONE) 200 MG tablet Take 1 tablet (200 mg total) by mouth 2 (two) times daily. (Patient taking differently: Take 200 mg by mouth daily. )   05/21/2015 at 800  .  Cholecalciferol (VITAMIN D3) 3000 units TABS Take 6,000 Units by mouth daily.   05/21/2015 at 900  . diltiazem (CARDIZEM CD) 180 MG 24 hr capsule Take 1 capsule (180 mg total) by mouth daily.   05/21/2015 at 800  . docusate sodium (COLACE) 100 MG capsule Take 100 mg by mouth daily as needed for mild constipation.   unknown  . famotidine (PEPCID) 20 MG tablet Take 20 mg by mouth at bedtime.   05/21/2015 at 2000  . fluticasone (FLONASE) 50 MCG/ACT nasal spray Place 1 spray into both nostrils daily as needed for allergies or rhinitis.   unknown  . furosemide (LASIX) 40 MG tablet Take 40-60 mg by mouth 2 (two) times daily. Take 1 1/2 tablets (60 mg) by mouth every morning and 1 tablet (40 mg) daily at 6pm   05/21/2015 at 1800  . guaifenesin (HUMIBID E) 400 MG TABS tablet Take 600 mg by mouth 2 (two) times daily.   05/21/2015 at 2000  . guaifenesin (ROBITUSSIN) 100 MG/5ML syrup Take 200 mg by mouth every 6 (six) hours as needed for cough.   unknown  . ipratropium-albuterol (DUONEB) 0.5-2.5 (3) MG/3ML SOLN Take 3 mLs by nebulization every 6 (six) hours as needed (shortness of breath).   05/22/2015 at early am  . levofloxacin (LEVAQUIN) 750 MG tablet Take 750 mg by mouth at bedtime. 7 day course started  05/21/15   05/21/2015 at 2100  . levothyroxine (SYNTHROID, LEVOTHROID) 50 MCG tablet Take 50 mcg by mouth daily before breakfast.   05/21/2015 at 630  . loperamide (IMODIUM A-D) 2 MG tablet Take 2 mg by mouth 4 (four) times daily as needed for diarrhea or loose stools. Maximum 8 tablets in 24 hours   unknown  . metoprolol tartrate (LOPRESSOR) 25 MG tablet Take 0.5 tablets (12.5 mg total) by mouth 2 (two) times daily.   05/21/2015 at 2000  . Multiple Vitamin (MULTIVITAMIN WITH MINERALS) TABS tablet Take 1 tablet by mouth daily.   05/21/2015  . OXYGEN Inhale into the lungs at bedtime. 2-4 L   05/21/2015  . potassium chloride SA (K-DUR,KLOR-CON) 20 MEQ tablet Take 20 mEq by mouth 2 (two) times daily. 9am, 5pm   05/21/2015 at 1700  .  rivaroxaban (XARELTO) 20 MG TABS tablet Take 1 tablet (20 mg total) by mouth daily with supper. 30 tablet  05/21/2015 at 1700  . saccharomyces boulardii (FLORASTOR) 250 MG capsule Take 250 mg by mouth 2 (two) times daily.   05/21/2015 at 2000  . simethicone (MYLICON) 80 MG chewable tablet Chew 80 mg by mouth every 6 (six) hours as needed (indigestion).   unknown  . Skin Protectants, Misc. (EUCERIN) cream Apply 1 application topically daily.   05/21/2015  . topiramate (TOPAMAX) 50 MG tablet Take 50 mg by mouth 2 (two) times daily.   05/21/2015 at 2000    Assessment: 45 yo female admitted on 4/5 with AMS and hypotension. She was on Xarelto PTA for AFib. This was stopped initially due to thrombocytopenia, which is now resolved. During her hospitalization she developed acute renal failure, evidenced by SCr peak of 5; her current SCr is 4.2, with eCrCl 20-30 ml/min. None of the DOACs are good options for her now with her renal dysfunction and her body habitus. Plan is to transition her to warfarin without a bridge. Baseline INR pending.   Goal of Therapy:  INR 2-3 Monitor platelets by anticoagulation protocol: Yes    Plan:  -Warfarin 7.5 mg po x1 -Daily INR   Baldemar FridayMasters, Lucette Kratz M 06/08/2015,11:13 AM

## 2015-06-08 NOTE — Progress Notes (Signed)
Called earlier in the morning for pain medication pulled ffrom [facility]  the patient refused to get because she she said not in pain as much .Fentanyl 50 mcg  discaded on the sink thereafter

## 2015-06-08 NOTE — Clinical Social Work Placement (Signed)
   CLINICAL SOCIAL WORK PLACEMENT  NOTE  Date:  06/08/2015  Patient Details  Name: Michele Weber MRN: [id number] Date of Birth: Mar 20, 1970  Clinical Social Work is seeking post-discharge placement for this patient at the Skilled  Nursing Facility level of care (*CSW will initial, date and re-position this form in  chart as items are completed):  Yes   Patient/family provided with [facility] Clinical Social Work Department's list of facilities offering this level of care within the geographic area requested by the patient (or if unable, by the patient's family).  Yes   Patient/family informed of their freedom to choose among providers that offer the needed level of care, that participate in Medicare, Medicaid or managed care program needed by the patient, have an available bed and are willing to accept the patient.  Yes   Patient/family informed of [facility]'s ownership interest in [facility] and [facility], as well as of the fact that they are under no obligation to receive care at these facilities.  PASRR submitted to EDS on       PASRR number received on       Existing PASRR number confirmed on 06/08/15     FL2 transmitted to all facilities in geographic area requested by pt/family on 05/31/15     FL2 transmitted to all facilities within larger geographic area on       Patient informed that his/her managed care company has contracts with or will negotiate with certain facilities, including the following:   (Yes)     Yes   Patient/family informed of bed offers received.  Patient [facility] bed at  [facility]     Physician recommends and patient chooses bed at      Patient to be transferred to  [facility]) on 06/08/15.  Patient to be transferred to facility by  [facility])     Patient family notified on 06/08/15 (Attempted to contact daughter. No answer and no voicemail option.) of transfer.  Name of family member notified:  Michele Weber  [phone]     PHYSICIAN Please sign FL2 (Please sign MOST form)     Additional Comment:    _______________________________________________ Margarito LinerSarah C Wesly Whisenant, LCSW 06/08/2015, 12:17 PM

## 2015-06-08 NOTE — Discharge Summary (Signed)
Physician Discharge Summary  Michele MowersSharon Weber UJW:[id number] DOB: 24-Oct-1970 DOA: 05/23/2015  PCP: Angela Coxasanayaka, Gayani Y, MD  Admit date: 05/23/2015 Discharge date: 06/08/2015  Time spent: 25 minutes  Recommendations for Outpatient Follow-up:    Will need Pt/INR in 3 days                                      Check BMP, renal function in 3 days                                                   Continue to hold the lasix due to kidney injury.    Consider starting back on lasix once renal function improves    Discharge Diagnoses:  Principal Problem:   MDR Acinetobacter baumannii infection Active Problems:   Morbid obesity (HCC)   Lymphedema of both lower extremities   Cellulitis and abscess of trunk   Sepsis (HCC)   Septic shock (HCC)   Acute respiratory failure (HCC)   Palliative care encounter   Goals of care, counseling/discussion   Acute renal failure with tubular necrosis (HCC)   Abscess   Encounter for nasogastric (NG) tube placement   Encounter for hospice care discussion   Encounter for central line placement   Discharge Condition: Stable  Diet recommendation: heart healthy diet  Filed Weights   06/07/15 0533 06/08/15 0628 06/08/15 0639  Weight: 246.303 kg (543 lb) 216.366 kg (477 lb) 250.839 kg (553 lb)    History of present illness:  Morbidly obese 850lb pt who presented initially with AMS and hypotension with presenting sepsis with GNR bacteremia. Denies any complaints this morning.  Hospital Course:   Gm neg bacteremia (acinetobacter) with sepsis present on admission  WBC today 12.5   Appreciate input by ID. Had been on meropenem. We'll started on Avycaz as of 4/13. Pricilla Holmvycaz has been discontinued by ID.  Will observe without antibiotics. I did recommend to recheck blood cultures if patient become symptomatic and consider amikacin and minocycline therapy if further positive cultures.  Has been afebrile, last WBC is 10.1  Central line successfully  replaced on 4/13 by PCCM. Will d./c central line before discharge.  Morbid obesity  Stable thus far  Pt's baseline is essentially being non-ambulatory and laying/sitting in bed  Rec for SNF by PT/OT  Hypoxemic hypercapnic resp failure secondary to morbid obesity  Per Pulmonary, CPAP QHS and can make PRN BiPAP. No intubation  Afib with RVR  Heart rate in 100-120's  On amiodarone 200mg  bid  Pt was earlier given trial of metoprolol, however pt became hypotensive, thus d/c'd.  Will restart the low dose of Metoprolol 12.5 mg po bid  Will discontinue xarelto and start Coumadin 5 mg po daily.  Recheck the PT/INR in 3 days  Acute on CKD likely secondary to septic shock  Difficult to assess volume status given morbid obesity  Cr seems to be showing some improvement today with cr 4.28  Cont to try to cont to avoid nephrotoxic agents as possible  Nephrology has signed off. Recs to cont to monitor. Hopeful for continued improvement in renal function.  Recheck the renal function in 3 days  Altered mental status  Much improved since admission  Pt axo x3, stable  Bulla ruptures  Noted in  multiple areas under the patient  Do not appear infected at present with good granulation seen  Appreciate input by WOC.  Procedures:  None  Consultations:   ID  Critical Care  Palliative Care  Nephrology Discharge Exam: Filed Vitals:   06/07/15 2007 06/08/15 0628  BP: 115/58 116/76  Pulse: 117 122  Temp: 98.3 F (36.8 C) 97.6 F (36.4 C)  Resp: 18 18    General: Appears in no acute distress Cardiovascular: S1s2 RRR Respiratory: Clear bilaterally  Discharge Instructions   Discharge Instructions    Diet - low sodium heart healthy    Complete by:  As directed      Increase activity slowly    Complete by:  As directed           Current Discharge Medication List    START taking these medications   Details  Hydrocodone-Acetaminophen (VICODIN) 5-300 MG TABS  Take 1 tablet by mouth every 6 (six) hours as needed. Qty: 30 each, Refills: 0    loratadine (CLARITIN) 10 MG tablet Take 1 tablet (10 mg total) by mouth daily.    ondansetron (ZOFRAN) 4 MG tablet Take 1 tablet (4 mg total) by mouth every 8 (eight) hours as needed for nausea or vomiting. Qty: 20 tablet, Refills: 0    pantoprazole (PROTONIX) 40 MG tablet Take 1 tablet (40 mg total) by mouth daily.    silver sulfADIAZINE (SILVADENE) 1 % cream Apply topically daily. Apply to LLE open wounds daily Qty: 50 g, Refills: 0    warfarin (COUMADIN) 5 MG tablet Take 1 tablet (5 mg total) by mouth daily.      CONTINUE these medications which have NOT CHANGED   Details  albuterol (PROVENTIL HFA;VENTOLIN HFA) 108 (90 Base) MCG/ACT inhaler Inhale 2 puffs into the lungs every 6 (six) hours as needed for wheezing or shortness of breath.    Amino Acids-Protein Hydrolys (FEEDING SUPPLEMENT, PRO-STAT SUGAR FREE 64,) LIQD Take 30 mLs by mouth 3 (three) times daily with meals. Qty: 900 mL, Refills: 0    amiodarone (PACERONE) 200 MG tablet Take 1 tablet (200 mg total) by mouth 2 (two) times daily.    Cholecalciferol (VITAMIN D3) 3000 units TABS Take 6,000 Units by mouth daily.    docusate sodium (COLACE) 100 MG capsule Take 100 mg by mouth daily as needed for mild constipation.    famotidine (PEPCID) 20 MG tablet Take 20 mg by mouth at bedtime.    fluticasone (FLONASE) 50 MCG/ACT nasal spray Place 1 spray into both nostrils daily as needed for allergies or rhinitis.    guaifenesin (HUMIBID E) 400 MG TABS tablet Take 600 mg by mouth 2 (two) times daily.    guaifenesin (ROBITUSSIN) 100 MG/5ML syrup Take 200 mg by mouth every 6 (six) hours as needed for cough.    ipratropium-albuterol (DUONEB) 0.5-2.5 (3) MG/3ML SOLN Take 3 mLs by nebulization every 6 (six) hours as needed (shortness of breath).    levothyroxine (SYNTHROID, LEVOTHROID) 50 MCG tablet Take 50 mcg by mouth daily before breakfast.     loperamide (IMODIUM A-D) 2 MG tablet Take 2 mg by mouth 4 (four) times daily as needed for diarrhea or loose stools. Maximum 8 tablets in 24 hours    metoprolol tartrate (LOPRESSOR) 25 MG tablet Take 0.5 tablets (12.5 mg total) by mouth 2 (two) times daily.    Multiple Vitamin (MULTIVITAMIN WITH MINERALS) TABS tablet Take 1 tablet by mouth daily.    OXYGEN Inhale into the lungs at bedtime.  2-4 L    saccharomyces boulardii (FLORASTOR) 250 MG capsule Take 250 mg by mouth 2 (two) times daily.    simethicone (MYLICON) 80 MG chewable tablet Chew 80 mg by mouth every 6 (six) hours as needed (indigestion).    Skin Protectants, Misc. (EUCERIN) cream Apply 1 application topically daily.    topiramate (TOPAMAX) 50 MG tablet Take 50 mg by mouth 2 (two) times daily.      STOP taking these medications     acetaminophen (TYLENOL) 325 MG tablet      acetaminophen (TYLENOL) 500 MG tablet      diltiazem (CARDIZEM CD) 180 MG 24 hr capsule      furosemide (LASIX) 40 MG tablet      levofloxacin (LEVAQUIN) 750 MG tablet      potassium chloride SA (K-DUR,KLOR-CON) 20 MEQ tablet      rivaroxaban (XARELTO) 20 MG TABS tablet        No Known Allergies    The results of significant diagnostics from this hospitalization (including imaging, microbiology, ancillary and laboratory) are listed below for reference.    Significant Diagnostic Studies: US Abdomen Limited  05/23/2015  CLINICAL DATA:  Abdominal distension, possible ascites or fluid collection. EXAM: LIMITED ABDOMINAL ULTRASOUND COMPARISON:  None. FINDINGS: Limited ultrasound of the lower abdominal wall right and left lower quadrant demonstrates no evidence of fluid collection. IMPRESSION: No evidence of fluid collection in right and left lower quadrant. Electronically Signed   By: Natasha Mead M.D.   On: 05/23/2015 15:27   [place] Extrem Low Bilat Ltd  05/23/2015  CLINICAL DATA:  Swollen thighs with induration. EXAM: BILATERAL LOWER EXTREMITY  LIMITED SOFT TISSUE ULTRASOUND TECHNIQUE: Ultrasound examination of the lower extremity soft tissues was performed in the area of clinical concern. COMPARISON:  10/04/2013 FINDINGS: On the right, there is diffuse soft tissue edema, but no focal fluid collection to suggest an abscess. No masses. On the left there is a small superficial fluid collection, which lies along the deep layer of the skin bulging into the underlying superficial subcutaneous fat. It measures 2.5 x 0.6 x 2.9 cm. No other fluid collection. There is diffuse surrounding soft tissue edema. IMPRESSION: 1. Small superficial fluid collection along the medial left thigh which could reflect that abscess. It measures 2.9 cm greatest dimension but only 6 mm in thickness. 2. No evidence of a right medial thigh abscess/fluid collection. 3. Bilateral medial thigh nonspecific edema. Electronically Signed   By: Amie Portland M.D.   On: 05/23/2015 15:13   Dg Chest Port 1 View  05/31/2015  CLINICAL DATA:  Central line placement EXAM: PORTABLE CHEST 1 VIEW COMPARISON:  05/24/2011 FINDINGS: New central line from a left jugular approach. Tip appears to be in the mid right atrium. Right jugular catheter in the SVC unchanged. No pneumothorax Cardiac enlargement. Improvement in vascular congestion. Improvement in bibasilar atelectasis. IMPRESSION: Left jugular central venous catheter tip in the mid right stroke atrium. No pneumothorax Improvement in vascular congestion. Improvement in bibasilar atelectasis. Electronically Signed   By: Marlan Palau M.D.   On: 05/31/2015 15:37   Dg Chest Port 1 View  05/24/2015  CLINICAL DATA:  Respiratory failure. EXAM: PORTABLE CHEST 1 VIEW COMPARISON:  05/23/2015 . FINDINGS: Right IJ line stable position. Cardiomegaly with pulmonary vascular prominence and increasing bilateral infiltrates. These findings consistent congestive heart failure. No pleural effusion or pneumothorax. IMPRESSION: 1. Right IJ line stable position. 2.  Cardiomegaly with diffuse new onset bilateral pulmonary infiltrates/edema suggesting congestive heart failure.  Bilateral pneumonia cannot be excluded . Electronically Signed   By: Maisie Fus  Register   On: 05/24/2015 07:29   Dg Chest Port 1 View  05/23/2015  CLINICAL DATA:  Respiratory failure EXAM: PORTABLE CHEST 1 VIEW COMPARISON:  May 22, 2015 FINDINGS: Central catheter tip is in the superior vena cava. No other tumor catheter seen. In particular, an endotracheal tube is not appreciable. Note that the patient's chin overlies the apices and may obscure a tube more superiorly. No edema or consolidation. Heart is enlarged with mild pulmonary venous hypertension. No adenopathy evident. Visualized bony structures appear intact. IMPRESSION: Central catheter tip in superior vena cava. No pneumothorax. Cardiomegaly with pulmonary venous hypertension. These findings are indicative of a degree of pulmonary vascular congestion. No frank edema or consolidation. Electronically Signed   By: Bretta Bang III M.D.   On: 05/23/2015 13:45   Dg Abd Portable 1v  05/28/2015  CLINICAL DATA:  Feeding tube placement. EXAM: PORTABLE ABDOMEN - 1 VIEW COMPARISON:  Chest x-ray 05/24/2015 FINDINGS: Examination is limited due to morbid obesity. There is a feeding tube coursing down the esophagus and into the stomach. This tip is not identified for certain. There are overlying lines. IMPRESSION: Limited examination. Feeding tube is coursing down the esophagus and into the stomach but the tip is not identified. Electronically Signed   By: Rudie Meyer M.D.   On: 05/28/2015 14:07    Microbiology: Recent Results (from the past 240 hour(s))  Culture, blood (Routine X 2) w Reflex to ID Panel     Status: None   Collection Time: 06/01/15  3:36 PM  Result Value Ref Range Status   Specimen Description BLOOD RIGHT HAND  Final   Special Requests IN PEDIATRIC BOTTLE 4CC  Final   Culture NO GROWTH 5 DAYS  Final   Report Status  06/06/2015 FINAL  Final  Culture, blood (Routine X 2) w Reflex to ID Panel     Status: None   Collection Time: 06/01/15  3:46 PM  Result Value Ref Range Status   Specimen Description BLOOD RIGHT ANTECUBITAL  Final   Special Requests IN PEDIATRIC BOTTLE 3CC  Final   Culture NO GROWTH 5 DAYS  Final   Report Status 06/06/2015 FINAL  Final  C difficile quick scan w PCR reflex     Status: None   Collection Time: 06/06/15 12:11 PM  Result Value Ref Range Status   C Diff antigen NEGATIVE NEGATIVE Final   C Diff toxin NEGATIVE NEGATIVE Final   C Diff interpretation Negative for toxigenic C. difficile  Final     Labs: Basic Metabolic Panel:  Recent Labs Lab 06/04/15 0400 06/05/15 0525 06/06/15 0518 06/07/15 0313 06/08/15 0345  NA 136 137 139 141 141  K 4.9 4.4 4.6 4.3 3.8  CL 99* 101 103 104 106  CO2 23 22 21* 22 24  GLUCOSE 110* 108* 100* 109* 105*  BUN 116* 120* 118* 111* 106*  CREATININE 5.17* 4.80* 4.72* 4.55* 4.28*  CALCIUM 8.6* 8.8* 8.8* 8.6* 8.6*   CBC:  Recent Labs Lab 06/03/15 0659 06/04/15 0400 06/05/15 0525 06/06/15 0518 06/07/15 0313  WBC 20.9* 16.1* 12.3* 12.5* 10.1  HGB 9.5* 9.5* 9.4* 9.2* 8.8*  HCT 29.0* 29.5* 29.0* 28.7* 27.9*  MCV 93.5 92.5 92.4 92.6 93.0  PLT 175 167 169 172 157   Cardiac Enzymes:  Recent Labs Lab 06/03/15 0943  CKTOTAL 111    CBG:  Recent Labs Lab 06/05/15 1114 06/05/15 1629 06/05/15 2019 06/06/15 0038  06/06/15 0453  GLUCAP 100* 137* 107* 110* 91       Signed:  Carrington Olazabal S MD.  [facility] 06/08/2015, 11:14 AM

## 2015-06-08 NOTE — Progress Notes (Signed)
Confirmed the code status with patient. She wants to be full code. Will change the code status to FULL CODE.

## 2015-06-08 NOTE — Progress Notes (Signed)
Report given to Charge nurse [facility]

## 2015-06-09 NOTE — Progress Notes (Signed)
Patient D/C'd via Ambulance to [facility] approximately 2045 on 06/08/15. No further deviation noted.

## 2015-06-20 ENCOUNTER — Inpatient Hospital Stay
Admission: EM | Admit: 2015-06-20 | Discharge: 2015-06-21 | DRG: 872 | Disposition: A | Discharge status: Other Acute Inpatient Hospital | Payer: Medicare Other | Attending: Internal Medicine | Admitting: Internal Medicine

## 2015-06-20 DIAGNOSIS — Z833 Family history of diabetes mellitus: Secondary | ICD-10-CM | POA: Diagnosis not present

## 2015-06-20 DIAGNOSIS — Z9889 Other specified postprocedural states: Secondary | ICD-10-CM | POA: Diagnosis not present

## 2015-06-20 DIAGNOSIS — R6521 Severe sepsis with septic shock: Secondary | ICD-10-CM | POA: Diagnosis not present

## 2015-06-20 DIAGNOSIS — Z79899 Other long term (current) drug therapy: Secondary | ICD-10-CM

## 2015-06-20 DIAGNOSIS — N179 Acute kidney failure, unspecified: Secondary | ICD-10-CM | POA: Diagnosis not present

## 2015-06-20 DIAGNOSIS — N183 Chronic kidney disease, stage 3 (moderate): Secondary | ICD-10-CM | POA: Diagnosis present

## 2015-06-20 DIAGNOSIS — G4733 Obstructive sleep apnea (adult) (pediatric): Secondary | ICD-10-CM | POA: Diagnosis not present

## 2015-06-20 DIAGNOSIS — I890 Lymphedema, not elsewhere classified: Secondary | ICD-10-CM | POA: Diagnosis present

## 2015-06-20 DIAGNOSIS — I96 Gangrene, not elsewhere classified: Secondary | ICD-10-CM | POA: Diagnosis present

## 2015-06-20 DIAGNOSIS — S81802A Unspecified open wound, left lower leg, initial encounter: Secondary | ICD-10-CM

## 2015-06-20 DIAGNOSIS — J9600 Acute respiratory failure, unspecified whether with hypoxia or hypercapnia: Secondary | ICD-10-CM | POA: Diagnosis not present

## 2015-06-20 DIAGNOSIS — M726 Necrotizing fasciitis: Secondary | ICD-10-CM | POA: Diagnosis not present

## 2015-06-20 DIAGNOSIS — L03115 Cellulitis of right lower limb: Secondary | ICD-10-CM | POA: Diagnosis present

## 2015-06-20 DIAGNOSIS — A419 Sepsis, unspecified organism: Secondary | ICD-10-CM | POA: Diagnosis not present

## 2015-06-20 DIAGNOSIS — E039 Hypothyroidism, unspecified: Secondary | ICD-10-CM | POA: Diagnosis present

## 2015-06-20 DIAGNOSIS — I482 Chronic atrial fibrillation: Secondary | ICD-10-CM | POA: Diagnosis present

## 2015-06-20 DIAGNOSIS — Z0189 Encounter for other specified special examinations: Secondary | ICD-10-CM | POA: Diagnosis not present

## 2015-06-20 DIAGNOSIS — Z789 Other specified health status: Secondary | ICD-10-CM | POA: Diagnosis not present

## 2015-06-20 DIAGNOSIS — E1122 Type 2 diabetes mellitus with diabetic chronic kidney disease: Secondary | ICD-10-CM | POA: Diagnosis present

## 2015-06-20 DIAGNOSIS — L97129 Non-pressure chronic ulcer of left thigh with unspecified severity: Secondary | ICD-10-CM | POA: Diagnosis present

## 2015-06-20 DIAGNOSIS — S91302A Unspecified open wound, left foot, initial encounter: Secondary | ICD-10-CM | POA: Diagnosis present

## 2015-06-20 DIAGNOSIS — Z1624 Resistance to multiple antibiotics: Secondary | ICD-10-CM | POA: Diagnosis not present

## 2015-06-20 DIAGNOSIS — S81809A Unspecified open wound, unspecified lower leg, initial encounter: Secondary | ICD-10-CM | POA: Insufficient documentation

## 2015-06-20 DIAGNOSIS — J95821 Acute postprocedural respiratory failure: Secondary | ICD-10-CM | POA: Diagnosis not present

## 2015-06-20 DIAGNOSIS — I5032 Chronic diastolic (congestive) heart failure: Secondary | ICD-10-CM | POA: Diagnosis present

## 2015-06-20 DIAGNOSIS — L8990 Pressure ulcer of unspecified site, unspecified stage: Secondary | ICD-10-CM | POA: Diagnosis present

## 2015-06-20 DIAGNOSIS — E11622 Type 2 diabetes mellitus with other skin ulcer: Secondary | ICD-10-CM | POA: Diagnosis present

## 2015-06-20 DIAGNOSIS — B9689 Other specified bacterial agents as the cause of diseases classified elsewhere: Secondary | ICD-10-CM | POA: Diagnosis not present

## 2015-06-20 DIAGNOSIS — I959 Hypotension, unspecified: Secondary | ICD-10-CM | POA: Diagnosis present

## 2015-06-20 DIAGNOSIS — N184 Chronic kidney disease, stage 4 (severe): Secondary | ICD-10-CM | POA: Diagnosis not present

## 2015-06-20 DIAGNOSIS — Z7901 Long term (current) use of anticoagulants: Secondary | ICD-10-CM | POA: Diagnosis not present

## 2015-06-20 DIAGNOSIS — Z6841 Body Mass Index (BMI) 40.0 and over, adult: Secondary | ICD-10-CM | POA: Diagnosis not present

## 2015-06-20 DIAGNOSIS — Z7951 Long term (current) use of inhaled steroids: Secondary | ICD-10-CM

## 2015-06-20 DIAGNOSIS — L0390 Cellulitis, unspecified: Secondary | ICD-10-CM | POA: Diagnosis present

## 2015-06-20 DIAGNOSIS — L03116 Cellulitis of left lower limb: Secondary | ICD-10-CM | POA: Diagnosis present

## 2015-06-20 DIAGNOSIS — I4891 Unspecified atrial fibrillation: Secondary | ICD-10-CM | POA: Diagnosis not present

## 2015-06-20 DIAGNOSIS — E662 Morbid (severe) obesity with alveolar hypoventilation: Secondary | ICD-10-CM | POA: Diagnosis not present

## 2015-06-20 DIAGNOSIS — Z452 Encounter for adjustment and management of vascular access device: Secondary | ICD-10-CM

## 2015-06-20 LAB — CBC WITH DIFFERENTIAL/PLATELET
BAND NEUTROPHILS: 10 %
BASOS ABS: 0 10*3/uL (ref 0–0.1)
BASOS PCT: 0 %
Blasts: 0 %
EOS ABS: 0.3 10*3/uL (ref 0–0.7)
EOS PCT: 4 %
HCT: 35.8 % (ref 35.0–47.0)
HEMOGLOBIN: 11.6 g/dL — AB (ref 12.0–16.0)
LYMPHS ABS: 1.5 10*3/uL (ref 1.0–3.6)
LYMPHS PCT: 21 %
MCH: 31.4 pg (ref 26.0–34.0)
MCHC: 32.3 g/dL (ref 32.0–36.0)
MCV: 97.1 fL (ref 80.0–100.0)
MONO ABS: 0.8 10*3/uL (ref 0.2–0.9)
MONOS PCT: 11 %
Metamyelocytes Relative: 4 %
Myelocytes: 2 %
NEUTROS ABS: 4.7 10*3/uL (ref 1.4–6.5)
NEUTROS PCT: 48 %
NRBC: 2 /100{WBCs} — AB
OTHER: 0 %
PROMYELOCYTES ABS: 0 %
Platelets: 424 10*3/uL (ref 150–440)
RBC: 3.69 MIL/uL — ABNORMAL LOW (ref 3.80–5.20)
RDW: 17.1 % — ABNORMAL HIGH (ref 11.5–14.5)
WBC: 7.3 10*3/uL (ref 3.6–11.0)

## 2015-06-20 LAB — COMPREHENSIVE METABOLIC PANEL
ALBUMIN: 2.9 g/dL — AB (ref 3.5–5.0)
ALT: 15 U/L (ref 14–54)
ANION GAP: 14 (ref 5–15)
AST: 15 U/L (ref 15–41)
Alkaline Phosphatase: 69 U/L (ref 38–126)
BUN: 91 mg/dL — ABNORMAL HIGH (ref 6–20)
CO2: 23 mmol/L (ref 22–32)
Calcium: 8.7 mg/dL — ABNORMAL LOW (ref 8.9–10.3)
Chloride: 96 mmol/L — ABNORMAL LOW (ref 101–111)
Creatinine, Ser: 3.88 mg/dL — ABNORMAL HIGH (ref 0.44–1.00)
GFR calc Af Amer: 15 mL/min — ABNORMAL LOW (ref >60)
GFR calc non Af Amer: 13 mL/min — ABNORMAL LOW (ref >60)
GLUCOSE: 107 mg/dL — AB (ref 65–99)
POTASSIUM: 4.1 mmol/L (ref 3.5–5.1)
SODIUM: 133 mmol/L — AB (ref 135–145)
TOTAL PROTEIN: 7.5 g/dL (ref 6.5–8.1)
Total Bilirubin: 0.6 mg/dL (ref 0.3–1.2)

## 2015-06-20 LAB — CK: Total CK: 33 U/L — ABNORMAL LOW (ref 38–234)

## 2015-06-20 LAB — GLUCOSE, CAPILLARY: Glucose-Capillary: 107 mg/dL — ABNORMAL HIGH (ref 65–99)

## 2015-06-20 LAB — LACTIC ACID, PLASMA: LACTIC ACID, VENOUS: 2 mmol/L (ref 0.5–2.0)

## 2015-06-20 LAB — PROTIME-INR
INR: 2.15
PROTHROMBIN TIME: 23.8 s — AB (ref 11.4–15.0)

## 2015-06-20 LAB — SURGICAL PCR SCREEN
MRSA, PCR: NEGATIVE
Staphylococcus aureus: NEGATIVE

## 2015-06-20 LAB — LIPASE, BLOOD: Lipase: 49 U/L (ref 11–51)

## 2015-06-20 MED ORDER — HYDROCODONE-ACETAMINOPHEN 5-325 MG PO TABS
1.0000 | ORAL_TABLET | ORAL | Status: DC | PRN
  Administered 2015-06-20: 1 via ORAL
  Filled 2015-06-20: qty 1

## 2015-06-20 MED ORDER — GUAIFENESIN 100 MG/5ML PO SYRP
200.0000 mg | ORAL_SOLUTION | Freq: Four times a day (QID) | ORAL | Status: DC | PRN
  Filled 2015-06-20: qty 10

## 2015-06-20 MED ORDER — ALBUTEROL SULFATE HFA 108 (90 BASE) MCG/ACT IN AERS: 2.0000 | INHALATION_SPRAY | Freq: Four times a day (QID) | RESPIRATORY_TRACT | Status: DC | PRN

## 2015-06-20 MED ORDER — TORSEMIDE 20 MG PO TABS
40.0000 mg | ORAL_TABLET | Freq: Every day | ORAL | Status: DC
  Administered 2015-06-20: 40 mg via ORAL
  Filled 2015-06-20: qty 2

## 2015-06-20 MED ORDER — SODIUM CHLORIDE 0.9 % IV SOLN
INTRAVENOUS | Status: DC
  Administered 2015-06-20 – 2015-06-21 (×2): via INTRAVENOUS

## 2015-06-20 MED ORDER — METOPROLOL TARTRATE 25 MG PO TABS
12.5000 mg | ORAL_TABLET | Freq: Two times a day (BID) | ORAL | Status: DC
  Administered 2015-06-20: 12.5 mg via ORAL
  Filled 2015-06-20: qty 1

## 2015-06-20 MED ORDER — ACETAMINOPHEN 325 MG PO TABS: 650.0000 mg | ORAL_TABLET | Freq: Four times a day (QID) | ORAL | Status: DC | PRN

## 2015-06-20 MED ORDER — SODIUM CHLORIDE 0.9 % IV BOLUS (SEPSIS)
2000.0000 mL | Freq: Once | INTRAVENOUS | Status: AC
  Administered 2015-06-20: 1000 mL via INTRAVENOUS

## 2015-06-20 MED ORDER — DOCUSATE SODIUM 100 MG PO CAPS: 100.0000 mg | ORAL_CAPSULE | Freq: Every day | ORAL | Status: DC | PRN

## 2015-06-20 MED ORDER — SIMETHICONE 80 MG PO CHEW
80.0000 mg | CHEWABLE_TABLET | Freq: Four times a day (QID) | ORAL | Status: DC | PRN
  Filled 2015-06-20: qty 1

## 2015-06-20 MED ORDER — AMIODARONE HCL 200 MG PO TABS
200.0000 mg | ORAL_TABLET | Freq: Two times a day (BID) | ORAL | Status: DC
  Administered 2015-06-20: 200 mg via ORAL
  Filled 2015-06-20: qty 1

## 2015-06-20 MED ORDER — VANCOMYCIN HCL 10 G IV SOLR
2000.0000 mg | Freq: Once | INTRAVENOUS | Status: AC
  Administered 2015-06-20: 2000 mg via INTRAVENOUS
  Filled 2015-06-20: qty 2000

## 2015-06-20 MED ORDER — ACETAMINOPHEN 650 MG RE SUPP: 650.0000 mg | Freq: Four times a day (QID) | RECTAL | Status: DC | PRN

## 2015-06-20 MED ORDER — IPRATROPIUM-ALBUTEROL 0.5-2.5 (3) MG/3ML IN SOLN
3.0000 mL | Freq: Four times a day (QID) | RESPIRATORY_TRACT | Status: DC | PRN
Start: 2015-06-20 — End: 2015-06-22

## 2015-06-20 MED ORDER — SACCHAROMYCES BOULARDII 250 MG PO CAPS
250.0000 mg | ORAL_CAPSULE | Freq: Two times a day (BID) | ORAL | Status: DC
  Administered 2015-06-20 – 2015-06-21 (×3): 250 mg via ORAL
  Filled 2015-06-20 (×5): qty 1

## 2015-06-20 MED ORDER — PIPERACILLIN-TAZOBACTAM 3.375 G IVPB 30 MIN
3.3750 g | Freq: Once | INTRAVENOUS | Status: AC
  Administered 2015-06-20: 3.375 g via INTRAVENOUS
  Filled 2015-06-20: qty 50

## 2015-06-20 MED ORDER — MORPHINE SULFATE (PF) 4 MG/ML IV SOLN: 4.0000 mg | INTRAVENOUS | Status: DC | PRN

## 2015-06-20 MED ORDER — OXYCODONE HCL 5 MG PO TABS
5.0000 mg | ORAL_TABLET | ORAL | Status: DC | PRN
  Administered 2015-06-21: 5 mg via ORAL
  Filled 2015-06-20: qty 1

## 2015-06-20 MED ORDER — GUAIFENESIN ER 600 MG PO TB12
600.0000 mg | ORAL_TABLET | Freq: Two times a day (BID) | ORAL | Status: DC
  Administered 2015-06-20 – 2015-06-21 (×2): 600 mg via ORAL
  Filled 2015-06-20 (×2): qty 1

## 2015-06-20 MED ORDER — ONDANSETRON HCL 4 MG/2ML IJ SOLN
4.0000 mg | Freq: Four times a day (QID) | INTRAMUSCULAR | Status: DC | PRN
  Administered 2015-06-21: 4 mg via INTRAVENOUS
  Filled 2015-06-20: qty 2

## 2015-06-20 MED ORDER — SILVER SULFADIAZINE 1 % EX CREA: TOPICAL_CREAM | Freq: Two times a day (BID) | CUTANEOUS | Status: DC

## 2015-06-20 MED ORDER — LOPERAMIDE HCL 2 MG PO CAPS: 2.0000 mg | ORAL_CAPSULE | Freq: Four times a day (QID) | ORAL | Status: DC | PRN

## 2015-06-20 MED ORDER — CLINDAMYCIN PHOSPHATE 900 MG/50ML IV SOLN
900.0000 mg | Freq: Three times a day (TID) | INTRAVENOUS | Status: DC
  Administered 2015-06-20 – 2015-06-21 (×2): 900 mg via INTRAVENOUS
  Filled 2015-06-20 (×7): qty 50

## 2015-06-20 MED ORDER — ALBUTEROL SULFATE (2.5 MG/3ML) 0.083% IN NEBU: 2.5000 mg | INHALATION_SOLUTION | RESPIRATORY_TRACT | Status: DC | PRN

## 2015-06-20 MED ORDER — POTASSIUM CHLORIDE CRYS ER 20 MEQ PO TBCR
20.0000 meq | EXTENDED_RELEASE_TABLET | Freq: Every day | ORAL | Status: DC
  Administered 2015-06-20 – 2015-06-21 (×2): 20 meq via ORAL
  Filled 2015-06-20 (×2): qty 1

## 2015-06-20 MED ORDER — FLUTICASONE PROPIONATE 50 MCG/ACT NA SUSP
1.0000 | Freq: Every day | NASAL | Status: DC | PRN
  Filled 2015-06-20: qty 16

## 2015-06-20 MED ORDER — HEPARIN SODIUM (PORCINE) 5000 UNIT/ML IJ SOLN
5000.0000 [IU] | Freq: Three times a day (TID) | INTRAMUSCULAR | Status: DC
  Administered 2015-06-20 – 2015-06-21 (×2): 5000 [IU] via SUBCUTANEOUS
  Filled 2015-06-20 (×2): qty 1

## 2015-06-20 MED ORDER — LEVOTHYROXINE SODIUM 50 MCG PO TABS: 50.0000 ug | ORAL_TABLET | Freq: Every day | ORAL | Status: DC

## 2015-06-20 MED ORDER — FAMOTIDINE 20 MG PO TABS
20.0000 mg | ORAL_TABLET | Freq: Every day | ORAL | Status: DC
  Administered 2015-06-20 – 2015-06-21 (×2): 20 mg via ORAL
  Filled 2015-06-20 (×2): qty 1

## 2015-06-20 MED ORDER — INSULIN ASPART 100 UNIT/ML ~~LOC~~ SOLN: 0.0000 [IU] | Freq: Three times a day (TID) | SUBCUTANEOUS | Status: DC

## 2015-06-20 MED ORDER — SODIUM CHLORIDE 0.9 % IV SOLN
2000.0000 mg | INTRAVENOUS | Status: DC
  Filled 2015-06-20: qty 2000

## 2015-06-20 MED ORDER — LORATADINE 10 MG PO TABS
10.0000 mg | ORAL_TABLET | Freq: Every day | ORAL | Status: DC
  Administered 2015-06-20: 10 mg via ORAL
  Filled 2015-06-20: qty 1

## 2015-06-20 MED ORDER — PANTOPRAZOLE SODIUM 40 MG PO TBEC
40.0000 mg | DELAYED_RELEASE_TABLET | Freq: Every day | ORAL | Status: DC
  Administered 2015-06-20: 40 mg via ORAL
  Filled 2015-06-20: qty 1

## 2015-06-20 MED ORDER — PIPERACILLIN-TAZOBACTAM 4.5 G IVPB
4.5000 g | Freq: Three times a day (TID) | INTRAVENOUS | Status: DC
  Administered 2015-06-21: 4.5 g via INTRAVENOUS
  Filled 2015-06-20 (×6): qty 100

## 2015-06-20 MED ORDER — INSULIN ASPART 100 UNIT/ML ~~LOC~~ SOLN: 0.0000 [IU] | Freq: Every day | SUBCUTANEOUS | Status: DC

## 2015-06-20 MED ORDER — TOPIRAMATE 25 MG PO TABS
50.0000 mg | ORAL_TABLET | Freq: Two times a day (BID) | ORAL | Status: DC
  Administered 2015-06-20 – 2015-06-21 (×3): 50 mg via ORAL
  Filled 2015-06-20 (×5): qty 2

## 2015-06-20 MED ORDER — ONDANSETRON HCL 4 MG PO TABS: 4.0000 mg | ORAL_TABLET | Freq: Four times a day (QID) | ORAL | Status: DC | PRN

## 2015-06-20 NOTE — H&P (Signed)
[facility] [place] at [facility]   PATIENT NAME: Michele Weber    MR#:  [id number]  DATE OF BIRTH:  1970-12-27  DATE OF ADMISSION:  06/20/2015  PRIMARY CARE PHYSICIAN: [facility]   REQUESTING/REFERRING PHYSICIAN:Phillip Scotty Court, MD  CHIEF COMPLAINT:   Chief Complaint  Patient presents with  . Wound Dehiscence   Wound Dehiscence HISTORY OF PRESENT ILLNESS:  Michele Weber  is a 45 y.o. female with a known history of lymphedema of both lower extremities, morbid obesity, diastolic CHF, anemia and A. Fib. The patient was sent from facility due to complaint of large wound on her left lower extremity. The patient said that it is spontaneously opened up about 2 days ago. She was referred to wound care clinic but hasn't seen them yet. The patient denies any fever but has chills and nausea. Dr. Excell Seltzer evaluated the patient and recommended debridement tomorrow. The patient is being treated with antibiotics in the ED.  PAST MEDICAL HISTORY:   Past Medical History  Diagnosis Date  . Morbid obesity (HCC)   . Lymphedema of both lower extremities   . Diastolic CHF (HCC)   . Vitamin D deficiency   . Atrial fibrillation (HCC)   . Vitamin D deficiency   . Sepsis (HCC) 05/2015  . Anemia     unspecified  . Cellulitis and abscess     bilateral legs    PAST SURGICAL HISTORY:   Past Surgical History  Procedure Laterality Date  . Central venous catheter insertion  05/31/2015  . Incision and drainage abscess  2015 ?    left thigh    SOCIAL HISTORY:   Social History  Substance Use Topics  . Smoking status: Never Smoker   . Smokeless tobacco: Never Used  . Alcohol Use: No    FAMILY HISTORY:   Family History  Problem Relation Age of Onset  . Diabetes Father     DRUG ALLERGIES:  No Known Allergies  REVIEW OF SYSTEMS:  CONSTITUTIONAL: No fever, has chills and generalized weakness.  EYES: No blurred or double vision.  EARS, NOSE, AND THROAT:  No tinnitus or ear pain.  RESPIRATORY: No cough, shortness of breath, wheezing or hemoptysis.  CARDIOVASCULAR: No chest pain, orthopnea, edema.  GASTROINTESTINAL: No nausea, vomiting, diarrhea or abdominal pain.  GENITOURINARY: No dysuria, hematuria.  ENDOCRINE: No polyuria, nocturia,  HEMATOLOGY: No anemia, easy bruising or bleeding SKIN: No rash or lesion. MUSCULOSKELETAL: No joint pain or arthritis.  But has left leg wound infection. NEUROLOGIC: No tingling, numbness, weakness.  PSYCHIATRY: No anxiety or depression.   MEDICATIONS AT HOME:   Prior to Admission medications   Medication Sig Start Date End Date Taking? Authorizing Provider  acetaminophen (TYLENOL) 325 MG tablet Take 650 mg by mouth every 4 (four) hours as needed for mild pain.   Yes Historical Provider, MD  albuterol (PROVENTIL HFA;VENTOLIN HFA) 108 (90 Base) MCG/ACT inhaler Inhale 2 puffs into the lungs every 6 (six) hours as needed for wheezing or shortness of breath.   Yes Historical Provider, MD  Amino Acids-Protein Hydrolys (FEEDING SUPPLEMENT, PRO-STAT SUGAR FREE 64,) LIQD Take 30 mLs by mouth 3 (three) times daily with meals. 10/04/13  Yes Calvert Cantor, MD  amiodarone (PACERONE) 200 MG tablet Take 1 tablet (200 mg total) by mouth 2 (two) times daily. 10/04/13  Yes Calvert Cantor, MD  cholecalciferol (VITAMIN D) 1000 units tablet Take 2,000 Units by mouth daily.   Yes Historical Provider, MD  docusate sodium (COLACE) 100  MG capsule Take 100 mg by mouth daily as needed for mild constipation.   Yes Historical Provider, MD  famotidine (PEPCID) 20 MG tablet Take 20 mg by mouth at bedtime.   Yes Historical Provider, MD  fluticasone (FLONASE) 50 MCG/ACT nasal spray Place 1 spray into both nostrils daily as needed for allergies or rhinitis.   Yes Historical Provider, MD  guaiFENesin (MUCINEX) 600 MG 12 hr tablet Take 600 mg by mouth 2 (two) times daily.   Yes Historical Provider, MD  guaifenesin (ROBITUSSIN) 100 MG/5ML syrup Take  200 mg by mouth every 6 (six) hours as needed for cough.   Yes Historical Provider, MD  Hydrocodone-Acetaminophen 5-300 MG TABS Take 1 tablet by mouth every 6 (six) hours as needed (for pain).   Yes Historical Provider, MD  ipratropium-albuterol (DUONEB) 0.5-2.5 (3) MG/3ML SOLN Take 3 mLs by nebulization every 6 (six) hours as needed (for shortness of breath).    Yes Historical Provider, MD  levothyroxine (SYNTHROID, LEVOTHROID) 50 MCG tablet Take 50 mcg by mouth daily before breakfast.   Yes Historical Provider, MD  loperamide (IMODIUM) 2 MG capsule Take 2 mg by mouth every 6 (six) hours as needed for diarrhea or loose stools.   Yes Historical Provider, MD  loratadine (CLARITIN) 10 MG tablet Take 1 tablet (10 mg total) by mouth daily. 06/08/15  Yes Meredeth Ide, MD  metoprolol tartrate (LOPRESSOR) 25 MG tablet Take 0.5 tablets (12.5 mg total) by mouth 2 (two) times daily. 10/04/13  Yes Calvert Cantor, MD  Multiple Vitamin (MULTIVITAMIN WITH MINERALS) TABS tablet Take 1 tablet by mouth daily.   Yes Historical Provider, MD  ondansetron (ZOFRAN) 4 MG tablet Take 1 tablet (4 mg total) by mouth every 8 (eight) hours as needed for nausea or vomiting. 06/08/15  Yes Meredeth Ide, MD  pantoprazole (PROTONIX) 40 MG tablet Take 1 tablet (40 mg total) by mouth daily. 06/08/15  Yes Meredeth Ide, MD  potassium chloride SA (K-DUR,KLOR-CON) 20 MEQ tablet Take 20 mEq by mouth daily.   Yes Historical Provider, MD  saccharomyces boulardii (FLORASTOR) 250 MG capsule Take 250 mg by mouth 2 (two) times daily.   Yes Historical Provider, MD  silver sulfADIAZINE (SILVADENE) 1 % cream Apply 1 application topically 2 (two) times daily.   Yes Historical Provider, MD  simethicone (MYLICON) 80 MG chewable tablet Chew 80 mg by mouth every 6 (six) hours as needed (for indigestion).    Yes Historical Provider, MD  Skin Protectants, Misc. (EUCERIN) cream Apply 1 application topically daily.   Yes Historical Provider, MD  topiramate  (TOPAMAX) 50 MG tablet Take 50 mg by mouth 2 (two) times daily.   Yes Historical Provider, MD  torsemide (DEMADEX) 20 MG tablet Take 40 mg by mouth daily.   Yes Historical Provider, MD  warfarin (COUMADIN) 5 MG tablet Take 1 tablet (5 mg total) by mouth daily. 06/08/15  Yes Meredeth Ide, MD      VITAL SIGNS:  Blood pressure 95/61, pulse 103, temperature 98.3 F (36.8 C), temperature source Oral, resp. rate 21, last menstrual period 05/28/2015, SpO2 100 %.  PHYSICAL EXAMINATION:  GENERAL:  45 y.o.-year-old patient lying in the bed with no acute distress. Morbidly obese. EYES: Pupils equal, round, reactive to light and accommodation. No scleral icterus. Extraocular muscles intact.  HEENT: Head atraumatic, normocephalic. Oropharynx and nasopharynx clear.  NECK:  Supple, no jugular venous distention. No thyroid enlargement, no tenderness.  LUNGS: Normal breath sounds bilaterally, no wheezing, rales,rhonchi  or crepitation. No use of accessory muscles of respiration.  CARDIOVASCULAR: S1, S2 normal. No murmurs, rubs, or gallops.  ABDOMEN: Soft, nontender, nondistended. Bowel sounds present. No organomegaly or mass.  EXTREMITIES: No pedal edema, cyanosis, or clubbing. Open wounds with necrotic tissue and foul odor with purulence but no erythema suggestive of a very superficial infection. She has granulation tissue and a superficial infection on the dorsum of the foot the majority of the wound is on the medial and anterior side of the thigh extending down to the calf and posteriorly.  NEUROLOGIC: Cranial nerves II through XII are intact. Muscle strength 3/5 in all extremities. Sensation intact. Gait not checked.  PSYCHIATRIC: The patient is alert and oriented x 3.  SKIN: No obvious rash, lesion, or ulcer.   LABORATORY PANEL:   CBC  Recent Labs Lab 06/20/15 1711  WBC 7.3  HGB 11.6*  HCT 35.8  PLT 424    ------------------------------------------------------------------------------------------------------------------  Chemistries   Recent Labs Lab 06/20/15 1711  NA 133*  K 4.1  CL 96*  CO2 23  GLUCOSE 107*  BUN 91*  CREATININE 3.88*  CALCIUM 8.7*  AST 15  ALT 15  ALKPHOS 69  BILITOT 0.6   ------------------------------------------------------------------------------------------------------------------  Cardiac Enzymes No results for input(s): TROPONINI in the last 168 hours. ------------------------------------------------------------------------------------------------------------------  RADIOLOGY:  No results found.  EKG:   Orders placed or performed during the hospital encounter of 06/20/15  . ED EKG  . ED EKG    IMPRESSION AND PLAN:   Severe left leg and foot wound infection The patient will be admitted to medical floor. Continue Zosyn and vancomycin pharmacy to dose. Follow-up with Dr. Excell Seltzerooper for debridement of left thigh ulcer infection. Follow up wound culture after procedure. Podiatry consult for left foot infection.  Chronic A. fib on Coumadin. Continue amiodarone. Hold Coumadin for now. Continue Coumadin and pharmacy to dose after surgery. Diabetes. Start a sliding scale. Chronic diastolic CHF. Stable.  Morbid obesity.   All the records are reviewed and case discussed with ED provider. Management plans discussed with the patient, family and they are in agreement.  CODE STATUS: Full code  TOTAL TIME TAKING CARE OF THIS PATIENT: 57 minutes.    Shaune Pollackhen, Tameeka Luo M.D on 06/20/2015 at 7:26 PM  Between 7am to 6pm - Pager - [phone]  After 6pm go to [email or web address] - password [email or web address] [facility]  [facility]  Office  [phone]  CC: Primary care physician; [facility]

## 2015-06-20 NOTE — Progress Notes (Signed)
Pharmacy Antibiotic Note  Marcie MowersSharon Remus is a 45 y.o. female admitted on 06/20/2015 with cellulitis.  Pharmacy has been consulted for vancomycin/Zosyn dosing.  Plan: Pt received zosyn 3.375 g IV x1 in the ED and vancomycin 2 g IV x1 in the ED Pt with SCr of 3.88 (trending down since previous admission), unclear if pt has chronic kidney disease - no mention of renal function in H&P. [facility] with [facility], does not appear to be on HD. Looking back in chart pt had normal SCr in 09/2013.  Will tentatively order vancomycin 2000 mg IV q48h but may consider dosing by levels in this morbidly obese with unclear renal function at risk for accumulation.  Will order Zosyn 4.5 g IV q8h EI for now based on wt >120 kg. Will need to closely monitor renal function and order levels as appropriate.     Temp (24hrs), Avg:98.3 F (36.8 C), Min:98.3 F (36.8 C), Max:98.3 F (36.8 C)   Recent Labs Lab 06/20/15 1711  WBC 7.3  CREATININE 3.88*  LATICACIDVEN 2.0    Estimated Creatinine Clearance: 36.9 mL/min (by C-G formula based on Cr of 3.88).    No Known Allergies  Antimicrobials this admission:   Dose adjustments this admission:   Microbiology results:   Thank you for allowing pharmacy to be a part of this patient's care.  Marty HeckWang, Krupa Stege L 06/20/2015 8:57 PM

## 2015-06-20 NOTE — ED Notes (Signed)
Multiple attempts failed it get IV by this RN and others.  [facility]  Pt moved over to bariatric bed.

## 2015-06-20 NOTE — ED Notes (Addendum)
Pts L leg has extensive purulent drainage.  Wound extends up from L foot to groin onmedial and lateral sides of leg with breaks of normal skin.  Wound worse towards upper inner thigh.  Wallace CullensGray areas and tunneling noted to some parts of wound. Actively draining, pustule, open wound to 35% of leg.  Unable to visualize back of leg or sacrum at this time r/t unable to turn.

## 2015-06-20 NOTE — ED Provider Notes (Signed)
[facility] Emergency Department Provider Note  ____________________________________________  Time seen: 3:20 PM  I have reviewed the triage vital signs and the nursing notes.   HISTORY  Chief Complaint Wound Dehiscence    HPI Michele Weber is a 45 y.o. female comes to the ED complaining of a large wound on her left thigh. She states that it spontaneously opened up about 2 days ago. She was referred to a wound care clinic but has not seen them yet. Denies chest pain fevers chills or dizziness. No abdominal pain. Complains of pain in the left thigh and left foot. Also reports having tunneling wounds on the back of her left leg that are chronic.     Past Medical History  Diagnosis Date  . Morbid obesity (HCC)   . Lymphedema of both lower extremities   . Diastolic CHF (HCC)   . Vitamin D deficiency   . Atrial fibrillation (HCC)   . Vitamin D deficiency   . Sepsis (HCC) 05/2015  . Anemia     unspecified  . Cellulitis and abscess     bilateral legs     Patient Active Problem List   Diagnosis Date Noted  . Cellulitis 06/20/2015  . Wounds, multiple open, lower extremity   . Encounter for central line placement   . Abscess   . Encounter for nasogastric (NG) tube placement   . Encounter for hospice care discussion   . 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 Goals of care, counseling/discussion   . Acute renal failure with tubular necrosis (HCC)   . Palliative care encounter   . Acute respiratory failure (HCC)   . Septic shock (HCC)   . Sepsis (HCC) 05/23/2015  . Unspecified hypothyroidism 10/04/2013  . Hypotension 09/30/2013  . Edema 09/14/2013  . Atrial fibrillation with RVR (HCC) 08/11/2013  . Cellulitis and abscess of trunk 07/21/2013  . Abnormal weight gain 07/08/2013  . Anemia, unspecified 07/08/2013  . Encounter for long-term (current) use of other medications 07/04/2013  . Amenorrhea 06/07/2013  . Open wound of knee, leg  (except thigh), and ankle, complicated 06/03/2013  . Hypokalemia 05/12/2013  . Unspecified protein-calorie malnutrition (HCC) 05/12/2013  . Physical deconditioning 05/12/2013  . Morbid obesity (HCC)   . Lymphedema of both lower extremities   . Diastolic CHF (HCC)   . Vitamin D deficiency      Past Surgical History  Procedure Laterality Date  . Central venous catheter insertion  05/31/2015  . Incision and drainage abscess  2015 ?    left thigh     Current Outpatient Rx  Name  Route  Sig  Dispense  Refill  . acetaminophen (TYLENOL) 325 MG tablet   Oral   Take 650 mg by mouth every 4 (four) hours as needed for mild pain.         Marland Kitchen albuterol (PROVENTIL HFA;VENTOLIN HFA) 108 (90 Base) MCG/ACT inhaler   Inhalation   Inhale 2 puffs into the lungs every 6 (six) hours as needed for wheezing or shortness of breath.         . Amino Acids-Protein Hydrolys (FEEDING SUPPLEMENT, PRO-STAT SUGAR FREE 64,) LIQD   Oral   Take 30 mLs by mouth 3 (three) times daily with meals.   900 mL   0   . amiodarone (PACERONE) 200 MG tablet   Oral   Take 1 tablet (200 mg total) by mouth 2 (two) times daily.         . cholecalciferol (VITAMIN D)  1000 units tablet   Oral   Take 2,000 Units by mouth daily.         Marland Kitchen. docusate sodium (COLACE) 100 MG capsule   Oral   Take 100 mg by mouth daily as needed for mild constipation.         . famotidine (PEPCID) 20 MG tablet   Oral   Take 20 mg by mouth at bedtime.         . fluticasone (FLONASE) 50 MCG/ACT nasal spray   Each Nare   Place 1 spray into both nostrils daily as needed for allergies or rhinitis.         Marland Kitchen. guaiFENesin (MUCINEX) 600 MG 12 hr tablet   Oral   Take 600 mg by mouth 2 (two) times daily.         Marland Kitchen. guaifenesin (ROBITUSSIN) 100 MG/5ML syrup   Oral   Take 200 mg by mouth every 6 (six) hours as needed for cough.         . Hydrocodone-Acetaminophen 5-300 MG TABS   Oral   Take 1 tablet by mouth every 6 (six) hours  as needed (for pain).         Marland Kitchen. ipratropium-albuterol (DUONEB) 0.5-2.5 (3) MG/3ML SOLN   Nebulization   Take 3 mLs by nebulization every 6 (six) hours as needed (for shortness of breath).          Marland Kitchen. levothyroxine (SYNTHROID, LEVOTHROID) 50 MCG tablet   Oral   Take 50 mcg by mouth daily before breakfast.         . loperamide (IMODIUM) 2 MG capsule   Oral   Take 2 mg by mouth every 6 (six) hours as needed for diarrhea or loose stools.         Marland Kitchen. loratadine (CLARITIN) 10 MG tablet   Oral   Take 1 tablet (10 mg total) by mouth daily.         . metoprolol tartrate (LOPRESSOR) 25 MG tablet   Oral   Take 0.5 tablets (12.5 mg total) by mouth 2 (two) times daily.         . Multiple Vitamin (MULTIVITAMIN WITH MINERALS) TABS tablet   Oral   Take 1 tablet by mouth daily.         . ondansetron (ZOFRAN) 4 MG tablet   Oral   Take 1 tablet (4 mg total) by mouth every 8 (eight) hours as needed for nausea or vomiting.   20 tablet   0   . pantoprazole (PROTONIX) 40 MG tablet   Oral   Take 1 tablet (40 mg total) by mouth daily.         . potassium chloride SA (K-DUR,KLOR-CON) 20 MEQ tablet   Oral   Take 20 mEq by mouth daily.         Marland Kitchen. saccharomyces boulardii (FLORASTOR) 250 MG capsule   Oral   Take 250 mg by mouth 2 (two) times daily.         . silver sulfADIAZINE (SILVADENE) 1 % cream   Topical   Apply 1 application topically 2 (two) times daily.         . simethicone (MYLICON) 80 MG chewable tablet   Oral   Chew 80 mg by mouth every 6 (six) hours as needed (for indigestion).          . Skin Protectants, Misc. (EUCERIN) cream   Topical   Apply 1 application topically daily.         .Marland Kitchen  topiramate (TOPAMAX) 50 MG tablet   Oral   Take 50 mg by mouth 2 (two) times daily.         Marland Kitchen torsemide (DEMADEX) 20 MG tablet   Oral   Take 40 mg by mouth daily.         Marland Kitchen warfarin (COUMADIN) 5 MG tablet   Oral   Take 1 tablet (5 mg total) by mouth daily.             Allergies Review of patient's allergies indicates no known allergies.   Family History  Problem Relation Age of Onset  . Diabetes Father     Social History Social History  Substance Use Topics  . Smoking status: Never Smoker   . Smokeless tobacco: Never Used  . Alcohol Use: No    Review of Systems  Constitutional:   No fever or chills.  Eyes:   No vision changes.  ENT:   No sore throat. No rhinorrhea. Cardiovascular:   No chest pain. Respiratory:   No dyspnea or cough. Gastrointestinal:   Negative for abdominal pain, vomiting and diarrhea.  Genitourinary:   Negative for dysuria or difficulty urinating. Musculoskeletal:   Left leg and foot pain as above Neurological:   Negative for headaches 10-point ROS otherwise negative.  ____________________________________________   PHYSICAL EXAM:  VITAL SIGNS: ED Triage Vitals  Enc Vitals Group     BP 06/20/15 1525 95/61 mmHg     Pulse Rate 06/20/15 1525 109     Resp 06/20/15 1525 16     Temp 06/20/15 1525 98.3 F (36.8 C)     Temp Source 06/20/15 1525 Oral     SpO2 06/20/15 1525 99 %     Weight --      Height --      Head Cir --      Peak Flow --      Pain Score 06/20/15 1526 3     Pain Loc --      Pain Edu? --      Excl. in GC? --     Vital signs reviewed, nursing assessments reviewed.   Constitutional:   Alert and oriented. Uncomfortable but not in distress. Morbidly obese Eyes:   No scleral icterus. No conjunctival pallor. PERRL. EOMI.  No nystagmus. ENT   Head:   Normocephalic and atraumatic.   Nose:   No congestion/rhinnorhea. No septal hematoma   Mouth/Throat:   MMM, no pharyngeal erythema. No peritonsillar mass.    Neck:   No stridor. No SubQ emphysema. No meningismus. Hematological/Lymphatic/Immunilogical:   No cervical lymphadenopathy. Cardiovascular:   RRR. Symmetric bilateral radial and DP pulses.  No murmurs.  Respiratory:   Normal respiratory effort without tachypnea nor  retractions. Breath sounds are clear and equal bilaterally. No wheezes/rales/rhonchi. Gastrointestinal:   Soft and nontender. No abdominal wall crepitus. Non distended. There is no CVA tenderness.  No rebound, rigidity, or guarding. Genitourinary:   No focal swelling or crepitus on the perineum Musculoskeletal:  Left leg has a large irregular skin defect with a gaping wound exposing the deep subcutaneous fat tissues. There is purulent exudate throughout the wound. It does not probe deeper than the fat tissue throughout the course. There is no crepitus. No necrotic tissue. Does not extend to the area of the knee joint or hip joint. The left volar foot also has a large area of skin breakdown with purulent exudate and tenderness. No crepitus throughout the course of the leg  Neurologic:   Normal  speech and language.  CN 2-10 normal. Motor grossly intact. No gross focal neurologic deficits are appreciated.  Skin:    Skin is warm, dry with large wound as described above..  ____________________________________________    LABS (pertinent positives/negatives) (all labs ordered are listed, but only abnormal results are displayed) Labs Reviewed  COMPREHENSIVE METABOLIC PANEL - Abnormal; Notable for the following:    Sodium 133 (*)    Chloride 96 (*)    Glucose, Bld 107 (*)    BUN 91 (*)    Creatinine, Ser 3.88 (*)    Calcium 8.7 (*)    Albumin 2.9 (*)    GFR calc non Af Amer 13 (*)    GFR calc Af Amer 15 (*)    All other components within normal limits  CBC WITH DIFFERENTIAL/PLATELET - Abnormal; Notable for the following:    RBC 3.69 (*)    Hemoglobin 11.6 (*)    RDW 17.1 (*)    All other components within normal limits  CK - Abnormal; Notable for the following:    Total CK 33 (*)    All other components within normal limits  CULTURE, BLOOD (ROUTINE X 2)  CULTURE, BLOOD (ROUTINE X 2)  WOUND CULTURE  LIPASE, BLOOD  LACTIC ACID, PLASMA    ____________________________________________   EKG  Interpreted by me Sinus tachycardia rate 100, normal axis and intervals. Poor R-wave progression in anterior precordial leads. Normal ST segments and T waves.  ____________________________________________    RADIOLOGY    ____________________________________________   PROCEDURES Peripheral IV insertion by physician under ultrasound guidance Indication: Multiple failed attempts by nursing Technique: Real-time continuous ultrasound visualization of vein in the right antecubital fossa. 20-gauge IV catheter inserted into the vein with one attempt with good drawback of blood and easily flushing. No complications, EBL 0.  ____________________________________________   INITIAL IMPRESSION / ASSESSMENT AND PLAN / ED COURSE  Pertinent labs & imaging results that were available during my care of the patient were reviewed by me and considered in my medical decision making (see chart for details).  Patient presents with large infected soft tissue wound of the left thigh. I discussed the case with Dr. Excell Seltzer of surgery who evaluated the patient in the emergency department and plans for surgical debridement in the next 1-2 days. He recommends IV antibiotics for now. Give the patient IV vancomycin, Zosyn, clindamycin and IV fluids and continue monitoring. Case discussed with the hospitalist for admission.     ____________________________________________   FINAL CLINICAL IMPRESSION(S) / ED DIAGNOSES  Final diagnoses:  Wounds, multiple open, lower extremity, left, initial encounter  Cellulitis of left lower extremity       Portions of this note were generated with dragon dictation software. Dictation errors may occur despite best attempts at proofreading.   Sharman Cheek, MD 06/20/15 [phone]

## 2015-06-20 NOTE — ED Notes (Signed)
Attempted to give report 

## 2015-06-20 NOTE — Consult Note (Signed)
Surgical Consultation  06/20/2015  Michele Weber is an 45 y.o. female.   CC: Leg Wounds  HPI: This patient will long-standing leg wounds she is massively morbidly obese and bedridden and presents with wounds of the left leg and foot both anterior and posteriorly she's had foul-smelling odor for several weeks and has been cared for at a care center. I was asked see the patient by Dr. Scotty CourtStafford  Past Medical History  Diagnosis Date  . Morbid obesity (HCC)   . Lymphedema of both lower extremities   . Diastolic CHF (HCC)   . Vitamin D deficiency   . Atrial fibrillation (HCC)   . Vitamin D deficiency   . Sepsis (HCC) 05/2015  . Anemia     unspecified  . Cellulitis and abscess     bilateral legs    Past Surgical History  Procedure Laterality Date  . Central venous catheter insertion  05/31/2015  . Incision and drainage abscess  2015 ?    left thigh    Family History  Problem Relation Age of Onset  . Diabetes Father     Social History:  reports that she has never smoked. She has never used smokeless tobacco. She reports that she does not drink alcohol or use illicit drugs.  Allergies: No Known Allergies  Medications reviewed.   Review of Systems:   Review of Systems  Unable to perform ROS: acuity of condition     Physical Exam:  BP 95/61 mmHg  Pulse 96  Temp(Src) 98.3 F (36.8 C) (Oral)  Resp 18  SpO2 100%  LMP 05/28/2015  Physical Exam  Constitutional: She is oriented to person, place, and time.  Massive morbid obesity Foul odor emanating from bed  HENT:  Head: Normocephalic and atraumatic.  Eyes: Pupils are equal, round, and reactive to light. Right eye exhibits no discharge. Left eye exhibits no discharge. No scleral icterus.  Neck: Normal range of motion.  Cardiovascular: Normal rate, regular rhythm and normal heart sounds.   Pulmonary/Chest: Effort normal. No respiratory distress. She has no wheezes.  Abdominal: Soft. There is no tenderness.   Lymphadenopathy:    She has no cervical adenopathy.  Neurological: She is alert and oriented to person, place, and time.  Skin: Skin is warm. No erythema.  Open wounds with necrotic tissue and foul odor with purulence but no erythema suggestive of a very superficial infection. She has granulation tissue and a superficial infection on the dorsum of the foot the majority of the wound is on the medial and anterior side of the thigh extending down to the calf and posteriorly      No results found for this or any previous visit (from the past 48 hour(s)). No results found.  Assessment/Plan:  This a massively morbidly obese patient with gangrenous tissue in the superficial tissues of the left leg and foot have seen the patient with an discussed the patient with Dr. Scotty CourtStafford in the emergency room and I'm suggesting Silvadene dressings and in the near future she will need to go to the operating room for debridement she will also require IV antibiotics have asked Dr. Scotty CourtStafford to arrange for internal medicine admission  [name], MD, FACS

## 2015-06-20 NOTE — ED Notes (Signed)
Pt bib EMS from [facility] w/ c/o weeping wounds.  Pts wound in L leg.  Pt sts that she has been treated for wound for 1-2 weeks.  Pt sts that she has appt at wound clinic 5/4 but felt like she needed to be seen today.  Pt sts that wound occurred in last "couple of day", st[facility] that [facility].  NAD.

## 2015-06-20 NOTE — Progress Notes (Signed)
ANTICOAGULATION CONSULT NOTE - Initial Consult  Pharmacy Consult for warfarin Indication: atrial fibrillation  No Known Allergies  Patient Measurements:     Vital Signs: Temp: 98.3 F (36.8 C) (05/03 1525) Temp Source: Oral (05/03 1525) BP: 95/61 mmHg (05/03 1525) Pulse Rate: 105 (05/03 1930)  Labs:  Recent Labs  06/20/15 1711  HGB 11.6*  HCT 35.8  PLT 424  CREATININE 3.88*  CKTOTAL 33*    Estimated Creatinine Clearance: 36.9 mL/min (by C-G formula based on Cr of 3.88).   Medical History: Past Medical History  Diagnosis Date  . Morbid obesity (HCC)   . Lymphedema of both lower extremities   . Diastolic CHF (HCC)   . Vitamin D deficiency   . Atrial fibrillation (HCC)   . Vitamin D deficiency   . Sepsis (HCC) 05/2015  . Anemia     unspecified  . Cellulitis and abscess     bilateral legs     Assessment: 45 yo female on warfarin for AFib (Dr. Imogene Burnhen stated pt has AFib not for VTE treatment like consult says). Pt could not state what her dose is, unclear when last dose was taken. [facility] and med list states warfarin 5 mg daily.    Goal of Therapy:  INR 2-3 Monitor platelets by anticoagulation protocol: Yes   Plan:  Will check INR Per Dr. Imogene Burnhen, told me verbally to hold warfarin tonight (do not give dose tonight) as pt might need debridement in the OR.  Please follow up warfarin plan tomorrow  Pharmacy will continue to follow.   Crist FatWang, Lemar Bakos L 06/20/2015,7:39 PM

## 2015-06-21 ENCOUNTER — Ambulatory Visit: Payer: Medicare Other | Admitting: Surgery

## 2015-06-21 ENCOUNTER — Encounter: Payer: Self-pay | Admitting: Anesthesiology

## 2015-06-21 ENCOUNTER — Encounter
Admission: EM | Disposition: A | Discharge status: Other Acute Inpatient Hospital | Payer: Self-pay | Source: Home / Self Care | Attending: Internal Medicine

## 2015-06-21 ENCOUNTER — Inpatient Hospital Stay: Payer: Medicare Other

## 2015-06-21 DIAGNOSIS — S81802A Unspecified open wound, left lower leg, initial encounter: Secondary | ICD-10-CM

## 2015-06-21 LAB — GLUCOSE, CAPILLARY
GLUCOSE-CAPILLARY: 94 mg/dL (ref 65–99)
GLUCOSE-CAPILLARY: 109 mg/dL — AB (ref 65–99)
Glucose-Capillary: 78 mg/dL (ref 65–99)
Glucose-Capillary: 94 mg/dL (ref 65–99)

## 2015-06-21 LAB — BASIC METABOLIC PANEL
ANION GAP: 12 (ref 5–15)
BUN: 92 mg/dL — ABNORMAL HIGH (ref 6–20)
CHLORIDE: 101 mmol/L (ref 101–111)
CO2: 20 mmol/L — AB (ref 22–32)
CREATININE: 3.77 mg/dL — AB (ref 0.44–1.00)
Calcium: 8 mg/dL — ABNORMAL LOW (ref 8.9–10.3)
GFR calc non Af Amer: 13 mL/min — ABNORMAL LOW (ref >60)
GFR, EST AFRICAN AMERICAN: 16 mL/min — AB (ref >60)
Glucose, Bld: 98 mg/dL (ref 65–99)
Potassium: 4.1 mmol/L (ref 3.5–5.1)
Sodium: 133 mmol/L — ABNORMAL LOW (ref 135–145)

## 2015-06-21 LAB — CBC
HEMATOCRIT: 33.3 % — AB (ref 35.0–47.0)
HEMOGLOBIN: 10.7 g/dL — AB (ref 12.0–16.0)
MCH: 31.2 pg (ref 26.0–34.0)
MCHC: 32.2 g/dL (ref 32.0–36.0)
MCV: 97.1 fL (ref 80.0–100.0)
Platelets: 366 10*3/uL (ref 150–440)
RBC: 3.43 MIL/uL — AB (ref 3.80–5.20)
RDW: 17.2 % — ABNORMAL HIGH (ref 11.5–14.5)
WBC: 6.1 10*3/uL (ref 3.6–11.0)

## 2015-06-21 LAB — VANCOMYCIN, RANDOM: Vancomycin Rm: 20 ug/mL

## 2015-06-21 LAB — PROTIME-INR
INR: 2.46
Prothrombin Time: 26.4 seconds — ABNORMAL HIGH (ref 11.4–15.0)

## 2015-06-21 LAB — HEMOGLOBIN A1C: Hgb A1c MFr Bld: 5.6 % (ref 4.0–6.0)

## 2015-06-21 SURGERY — IRRIGATION AND DEBRIDEMENT WOUND
Anesthesia: Choice | Laterality: Left

## 2015-06-21 MED ORDER — SODIUM CHLORIDE 0.9 % IV BOLUS (SEPSIS)
1000.0000 mL | Freq: Once | INTRAVENOUS | Status: AC
  Administered 2015-06-21: 1000 mL via INTRAVENOUS

## 2015-06-21 MED ORDER — SODIUM CHLORIDE 0.9 % IV BOLUS (SEPSIS): 1000.0000 mL | Freq: Once | INTRAVENOUS | Status: DC

## 2015-06-21 MED ORDER — SODIUM CHLORIDE 0.9 % IV SOLN: Freq: Once | INTRAVENOUS | Status: DC

## 2015-06-21 MED ORDER — VANCOMYCIN HCL 10 G IV SOLR
2000.0000 mg | INTRAVENOUS | Status: DC
  Filled 2015-06-21: qty 2000

## 2015-06-21 MED ORDER — SODIUM CHLORIDE 0.9% FLUSH
500.0000 mL | Freq: Once | INTRAVENOUS | Status: DC
  Filled 2015-06-21: qty 501

## 2015-06-21 MED ORDER — TORSEMIDE 20 MG PO TABS: 20.0000 mg | ORAL_TABLET | Freq: Every day | ORAL | Status: DC

## 2015-06-21 SURGICAL SUPPLY — 16 items
BNDG GAUZE 4.5X4.1 6PLY STRL (MISCELLANEOUS) ×3 IMPLANT
CHLORAPREP W/TINT 26ML (MISCELLANEOUS) ×3 IMPLANT
DRAIN PENROSE 1/4X12 LTX (DRAIN) ×3 IMPLANT
ELECT REM PT RETURN 9FT ADLT (ELECTROSURGICAL) ×3
ELECTRODE REM PT RTRN 9FT ADLT (ELECTROSURGICAL) ×1 IMPLANT
GAUZE SPONGE 4X4 12PLY STRL (GAUZE/BANDAGES/DRESSINGS) ×3 IMPLANT
GLOVE BIO SURGEON STRL SZ8 (GLOVE) ×3 IMPLANT
GOWN STRL REUS W/ TWL LRG LVL3 (GOWN DISPOSABLE) ×2 IMPLANT
GOWN STRL REUS W/TWL LRG LVL3 (GOWN DISPOSABLE) ×4
KIT RM TURNOVER STRD PROC AR (KITS) ×3 IMPLANT
LABEL OR SOLS (LABEL) ×3 IMPLANT
NS IRRIG 500ML POUR BTL (IV SOLUTION) ×3 IMPLANT
PACK EXTREMITY ARMC (MISCELLANEOUS) IMPLANT
SPONGE LAP 18X18 5 PK (GAUZE/BANDAGES/DRESSINGS) ×3 IMPLANT
STOCKINETTE STRL 4IN 9604848 (GAUZE/BANDAGES/DRESSINGS) ×3 IMPLANT
SWAB CULTURE AMIES ANAERIB BLU (MISCELLANEOUS) IMPLANT

## 2015-06-21 NOTE — Care Management (Signed)
[facility] on hold right now however she does meet criteria and could have taken patient today. Surgeon is attempting to get [facility]- they are on diversion currently.

## 2015-06-21 NOTE — Progress Notes (Signed)
Orders recd for 1L ns bolus

## 2015-06-21 NOTE — Progress Notes (Signed)
Pharmacy Antibiotic Note  Marcie MowersSharon Norgaard is a 45 y.o. female admitted on 06/20/2015 with cellulitis.  Pharmacy has been consulted for vancomycin/Zosyn dosing.  Plan: Current orders for vancomycin 2gm IV Q48H and zosyn 4.5gm IV Q8H  Pt with SCr of 3.88 (trending down since previous admission), unclear if pt has chronic kidney disease - no mention of renal function in H&P. [facility], does not appear to be on HD. Looking back in chart pt had normal SCr in 09/2013.   Received vancomycin 2gm X 1 in ED last night. Will check random level 24 hours post dose. Continue current orders for vancomycin 2gm IV Q48H for now, consider adjusting based on random level.   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 Discussed with MD. Recommend obtaining ID input in regards to antibiotics due to recent history of MDR organism.  Patient is pending transfer at this time.   Height: 5' (152.4 cm) IBW/kg (Calculated) : 45.5  Temp (24hrs), Avg:97.9 F (36.6 C), Min:97.5 F (36.4 C), Max:98.3 F (36.8 C)   Recent Labs Lab 06/20/15 1711 06/21/15 0601  WBC 7.3 6.1  CREATININE 3.88* 3.77*  LATICACIDVEN 2.0  --     Estimated Creatinine Clearance: 38 mL/min (by C-G formula based on Cr of 3.77).    No Known Allergies  Antimicrobials this admission: 5/3 Zosyn >> 5/3 Vancomycin>>  Dose adjustments this admission:   Microbiology results: 5/3 Blood x 2 - sent 5/3 MRSA PCR negative 5/3 Wound  Thank you for allowing pharmacy to be a part of this patient's care.  Oberia Beaudoin C 06/21/2015 1:11 PM

## 2015-06-21 NOTE — Progress Notes (Signed)
I personally evaluated the patient and deemed the patient appropriate for surgical intervention to debride this patient's complex wounds in the operating room. anEsthesia staff did not feel that she was appropriate for general anesthesia at this facility and suggested transfer to tertiary care center as they would not give her  a general anesthetic because of her comorbidities and morbid obesity and airway issues. Patient will need to be transferred to tertiary care center for complex wound care

## 2015-06-21 NOTE — Discharge Summary (Addendum)
Michele Weber, is a 45 y.o. female  DOB March 16, 1970  MRN [id number].  Admission date:  06/20/2015  Admitting Physician  Shaune Pollack, MD  Discharge Date:  06/21/2015   Primary MD  [facility]  Recommendations for primary care physician for things to follow:  Being transferred to [facility]   Admission Diagnosis  Cellulitis of left lower extremity [L03.116] Wounds, multiple open, lower extremity, left, initial encounter [S81.802A]   Discharge Diagnosis  Cellulitis of left lower extremity [L03.116] Wounds, multiple open, lower extremity, left, initial encounter [S81.802A]    Active Problems:   Cellulitis      Past Medical History  Diagnosis Date  . Morbid obesity (HCC)   . Lymphedema of both lower extremities   . Diastolic CHF (HCC)   . Vitamin D deficiency   . Atrial fibrillation (HCC)   . Vitamin D deficiency   . Sepsis (HCC) 05/2015  . Anemia     unspecified  . Cellulitis and abscess     bilateral legs    Past Surgical History  Procedure Laterality Date  . Central venous catheter insertion  05/31/2015  . Incision and drainage abscess  2015 ?    left thigh       History of present illness and  Hospital Course:     Kindly see H&P for history of present illness and admission details, please review complete Labs, Consult reports and Test reports for all details in brief  HPI  from the history and physical done on the day of admission Michele Weber is a 45 y.o. female with a known history of lymphedema of both lower extremities, morbid obesity, diastolic CHF, anemia and A. Fib. The patient was sent from facility due to complaint of large wound on her left lower extremity. The patient said that it is spontaneously opened up about 2 days ago.  She is admitted for left thigh cellulitis with necrotic  wounds, started on IV vancomycin, clindamycin, Zosyn.   Hospital Course   #1 morbidly obese female with necrotic wounds in the left inner thigh, left flank, sacrum, right lower leg, left dorsal foot. And foul odor,. Started on IV vancomycin, Zosyn, clindamycin. WBC is normal. Blood cultures did not show any growth yet.  Patient seen by surgery ,anEsthesia staff did not feel that she was appropriate for general anesthesia at this facility and suggested transfer to tertiary care center as they would not give her a general anesthetic because of her comorbidities and morbid obesity and airway issues. Dr. Excell Seltzer the surgeon called [facility] but they are on diversion,. [facility],,Dr,Tsui agreed to debridement, spoke to Dr.Summers [facility] to [facility].Dr.Summers recommends aggressive hydration,and keep goal for Bp 90/60.at the time of transfer.spoke to [employer]. #2. Multiple wounds with left inner thigh, left dorsal foot, left flank needs debridement, needs air mattress and bariatric bed #3 chronic atrial fibrillation: Now hypotensive may be developing sepsis:  held the torsemide, and atenolol. Her Coumadin also was held in preparation for possible debridement today. #4 hypothyroidism continue Synthroid. 5.Lymphadema of extremities,: 6.Chronic renal failure ;ckd stage 3;baseline CR 2 weeks ago 4.28.actual  CR better.  Discharge Condition: stable   Follow UP      Discharge Instructions  and  Discharge Medications       Medication List    STOP taking these medications        acetaminophen 325 MG tablet  Commonly known as:  TYLENOL     amiodarone 200 MG tablet  Commonly known as:  PACERONE     docusate sodium 100 MG capsule  Commonly known as:  COLACE     famotidine 20 MG tablet  Commonly known as:  PEPCID     loperamide 2 MG capsule  Commonly known as:  IMODIUM     loratadine 10 MG tablet  Commonly known as:   CLARITIN     metoprolol tartrate 25 MG tablet  Commonly known as:  LOPRESSOR     multivitamin with minerals Tabs tablet     ondansetron 4 MG tablet  Commonly known as:  ZOFRAN     torsemide 20 MG tablet  Commonly known as:  DEMADEX     warfarin 5 MG tablet  Commonly known as:  COUMADIN      TAKE these medications        albuterol 108 (90 Base) MCG/ACT inhaler  Commonly known as:  PROVENTIL HFA;VENTOLIN HFA  Inhale 2 puffs into the lungs every 6 (six) hours as needed for wheezing or shortness of breath.     cholecalciferol 1000 units tablet  Commonly known as:  VITAMIN D  Take 2,000 Units by mouth daily.     eucerin cream  Apply 1 application topically daily.     feeding supplement (PRO-STAT SUGAR FREE 64) Liqd  Take 30 mLs by mouth 3 (three) times daily with meals.     fluticasone 50 MCG/ACT nasal spray  Commonly known as:  FLONASE  Place 1 spray into both nostrils daily as needed for allergies or rhinitis.     guaifenesin 100 MG/5ML syrup  Commonly known as:  ROBITUSSIN  Take 200 mg by mouth every 6 (six) hours as needed for cough.     guaiFENesin 600 MG 12 hr tablet  Commonly known as:  MUCINEX  Take 600 mg by mouth 2 (two) times daily.     Hydrocodone-Acetaminophen 5-300 MG Tabs  Take 1 tablet by mouth every 6 (six) hours as needed (for pain).     ipratropium-albuterol 0.5-2.5 (3) MG/3ML Soln  Commonly known as:  DUONEB  Take 3 mLs by nebulization every 6 (six) hours as needed (for shortness of breath).     levothyroxine 50 MCG tablet  Commonly known as:  SYNTHROID, LEVOTHROID  Take 50 mcg by mouth daily before breakfast.     pantoprazole 40 MG tablet  Commonly known as:  PROTONIX  Take 1 tablet (40 mg total) by mouth daily.     potassium chloride SA 20 MEQ tablet  Commonly known as:  K-DUR,KLOR-CON  Take 20 mEq by mouth daily.     saccharomyces boulardii 250 MG capsule  Commonly known as:  FLORASTOR  Take 250 mg by mouth 2 (two) times daily.      silver sulfADIAZINE 1 % cream  Commonly known as:  SILVADENE  Apply 1 application topically 2 (two) times daily.     simethicone 80 MG chewable tablet  Commonly known as:  MYLICON  Chew 80 mg by mouth every 6 (six) hours as needed (for indigestion).     topiramate 50 MG tablet  Commonly known as:  TOPAMAX  Take 50 mg by mouth 2 (two) times daily.          Diet and Activity recommendation: See Discharge Instructions above   Consults obtained - surgery Wound care   Major procedures and Radiology Reports - PLEASE review detailed and final reports for all details, in brief -  [place] Abdomen Limited  05/23/2015  CLINICAL DATA:  Abdominal distension, possible ascites or fluid collection. EXAM: LIMITED ABDOMINAL ULTRASOUND COMPARISON:  None. FINDINGS: Limited ultrasound of the lower abdominal wall right and left lower quadrant demonstrates no evidence of fluid collection. IMPRESSION: No evidence of fluid collection in right and left lower quadrant. Electronically Signed   By: Natasha MeadLiviu  Pop M.D.   On: 05/23/2015 15:27   [place] Extrem Low Bilat Ltd  05/23/2015  CLINICAL DATA:  Swollen thighs with induration. EXAM: BILATERAL LOWER EXTREMITY LIMITED SOFT TISSUE ULTRASOUND TECHNIQUE: Ultrasound examination of the lower extremity soft tissues was performed in the area of clinical concern. COMPARISON:  10/04/2013 FINDINGS: On the right, there is diffuse soft tissue edema, but no focal fluid collection to suggest an abscess. No masses. On the left there is a small superficial fluid collection, which lies along the deep layer of the skin bulging into the underlying superficial subcutaneous fat. It measures 2.5 x 0.6 x 2.9 cm. No other fluid collection. There is diffuse surrounding soft tissue edema. IMPRESSION: 1. Small superficial fluid collection along the medial left thigh which could reflect that abscess. It measures 2.9 cm greatest dimension but only 6 mm in thickness. 2. No evidence of a right medial  thigh abscess/fluid collection. 3. Bilateral medial thigh nonspecific edema. Electronically Signed   By: Amie Portlandavid  Ormond M.D.   On: 05/23/2015 15:13   Dg Chest Port 1 View  05/31/2015  CLINICAL DATA:  Central line placement EXAM: PORTABLE CHEST 1 VIEW COMPARISON:  05/24/2011 FINDINGS: New central line from a left jugular approach. Tip appears to be in the mid right atrium. Right jugular catheter in the SVC unchanged. No pneumothorax Cardiac enlargement. Improvement in vascular congestion. Improvement in bibasilar atelectasis. IMPRESSION: Left jugular central venous catheter tip in the mid right stroke atrium. No pneumothorax Improvement in vascular congestion. Improvement in bibasilar atelectasis. Electronically Signed   By: Marlan Palauharles  Clark M.D.   On: 05/31/2015 15:37   Dg Chest Port 1 View  05/24/2015  CLINICAL DATA:  Respiratory failure. EXAM: PORTABLE CHEST 1 VIEW COMPARISON:  05/23/2015 . FINDINGS: Right IJ line stable position. Cardiomegaly with pulmonary vascular prominence and increasing bilateral infiltrates. These findings consistent congestive heart failure. No pleural effusion or pneumothorax. IMPRESSION: 1. Right IJ line stable position. 2. Cardiomegaly with diffuse new onset bilateral pulmonary infiltrates/edema suggesting congestive heart failure. Bilateral pneumonia cannot be excluded . Electronically Signed   By: Maisie Fushomas  Register   On: 05/24/2015 07:29   Dg Chest Port 1 View  05/23/2015  CLINICAL DATA:  Respiratory failure EXAM: PORTABLE CHEST 1 VIEW COMPARISON:  May 22, 2015 FINDINGS: Central catheter tip is in the superior vena cava. No other tumor catheter seen. In particular, an endotracheal tube is not appreciable. Note that the patient's chin overlies the apices and may obscure a tube more superiorly. No edema or consolidation. Heart is enlarged with mild pulmonary venous hypertension. No adenopathy evident. Visualized bony structures appear intact. IMPRESSION: Central catheter tip in  superior vena cava. No pneumothorax. Cardiomegaly with pulmonary venous hypertension. These findings are indicative of a degree of pulmonary vascular congestion. No frank edema or consolidation. Electronically Signed   By: Bretta BangWilliam  Woodruff III M.D.   On: 05/23/2015 13:45   Dg Abd Portable 1v  05/28/2015  CLINICAL DATA:  Feeding tube placement. EXAM: PORTABLE ABDOMEN - 1 VIEW COMPARISON:  Chest x-ray 05/24/2015 FINDINGS: Examination is limited due to morbid obesity. There is a feeding tube coursing down the esophagus and into  the stomach. This tip is not identified for certain. There are overlying lines. IMPRESSION: Limited examination. Feeding tube is coursing down the esophagus and into the stomach but the tip is not identified. Electronically Signed   By: Rudie Meyer M.D.   On: 05/28/2015 14:07    Micro Results    Recent Results (from the past 240 hour(s))  Culture, blood (routine x 2)     Status: None (Preliminary result)   Collection Time: 06/20/15  5:11 PM  Result Value Ref Range Status   Specimen Description BLOOD RIGHT ASSIST CONTROL  Final   Special Requests BOTTLES DRAWN AEROBIC AND ANAEROBIC  5CC  Final   Culture NO GROWTH < 24 HOURS  Final   Report Status PENDING  Incomplete  Culture, blood (routine x 2)     Status: None (Preliminary result)   Collection Time: 06/20/15  5:11 PM  Result Value Ref Range Status   Specimen Description BLOOD LEFT ASSIST CONTROL  Final   Special Requests BOTTLES DRAWN AEROBIC AND ANAEROBIC  5CC  Final   Culture NO GROWTH < 24 HOURS  Final   Report Status PENDING  Incomplete  Wound culture     Status: None (Preliminary result)   Collection Time: 06/20/15  5:11 PM  Result Value Ref Range Status   Specimen Description WOUND  Final   Special Requests Normal  Final   Gram Stain PENDING  Incomplete   Culture   Final    HEAVY GROWTH GRAM NEGATIVE RODS IDENTIFICATION AND SUSCEPTIBILITIES TO FOLLOW    Report Status PENDING  Incomplete  Surgical PCR  screen     Status: None   Collection Time: 06/20/15 10:10 PM  Result Value Ref Range Status   MRSA, PCR NEGATIVE NEGATIVE Final   Staphylococcus aureus NEGATIVE NEGATIVE Final    Comment:        The Xpert SA Assay (FDA approved for NASAL specimens in patients over 85 years of age), is one component of a comprehensive surveillance program.  Test performance has been validated by [facility] for patients greater than or equal to 64 year old. It is not intended to diagnose infection nor to guide or monitor treatment.        Today   Subjective:   Caylor Cerino is stable for transfer to [facility]  Objective:   Blood pressure 77/57, pulse 94, temperature 97.5 F (36.4 C), temperature source Oral, resp. rate 18, height 5' (1.524 m), last menstrual period 05/28/2015, SpO2 100 %.   Intake/Output Summary (Last 24 hours) at 06/21/15 1430 Last data filed at 06/21/15 1052  Gross per 24 hour  Intake    390 ml  Output    850 ml  Net   -460 ml    Exam Awake Alert, Oriented x 3,morbidly obese female [place].AT,PERRAL Supple Neck,No JVD, No cervical lymphadenopathy appriciated.  Symmetrical Chest wall movement, Good air movement bilaterally, CTAB RRR,No Gallops,Rubs or new Murmurs, No Parasternal Heave +ve B.Sounds, Abd Soft, Non tender, No organomegaly appriciated, No rebound -guarding or rigidity. No Cyanosis, Clubbing or edema, No new Rash or bruise  Data Review   CBC w Diff:  Lab Results  Component Value Date   WBC 6.1 06/21/2015   WBC 7.2 05/13/2013   HGB 10.7* 06/21/2015   HCT 33.3* 06/21/2015   PLT 366 06/21/2015   LYMPHOPCT 21 06/20/2015   BANDSPCT 10 06/20/2015   MONOPCT 11 06/20/2015   EOSPCT 4 06/20/2015   BASOPCT 0 06/20/2015    CMP:  Lab Results  Component Value Date   NA 133* 06/21/2015   NA 136* 05/13/2013   K 4.1 06/21/2015   CL 101 06/21/2015   CO2 20* 06/21/2015   BUN 92* 06/21/2015   BUN 11 05/13/2013   CREATININE 3.77* 06/21/2015    CREATININE 1.0 05/13/2013   GLU 85 05/13/2013   PROT 7.5 06/20/2015   ALBUMIN 2.9* 06/20/2015   BILITOT 0.6 06/20/2015   ALKPHOS 69 06/20/2015   AST 15 06/20/2015   ALT 15 06/20/2015  .   Total Time in preparing paper work, data evaluation and todays exam - 35 minutes  Dinesha Twiggs M.D on 06/21/2015 at 2:30 PM    Note: This dictation was prepared with Dragon dictation along with smaller phrase technology. Any transcriptional errors that result from this process are unintentional.

## 2015-06-21 NOTE — Progress Notes (Signed)
Pt complains of feeling week and cold. bp 77/57, hr 94. Will inform md.

## 2015-06-21 NOTE — Care Management (Signed)
Approached by Dr. Luberta MutterKonidena asking about LTAC for this patient. Medicare requires certain revenue codes along with a 3-inpatient night stay. [facility] has declined. [facility] is reviewing.

## 2015-06-21 NOTE — Progress Notes (Signed)
Patient bp 104/53. Care link called. [facility].

## 2015-06-21 NOTE — Progress Notes (Signed)
Patient has multiple open wounds.  Left inner thigh, outter thigh and top of left foot. Left hip and left skin "flap" necrotic and draining purulent foul smelling fluids. Right inner thigh. Working with Careers advisersurgeon, wound care and primary md on orders for wound care.

## 2015-06-21 NOTE — Progress Notes (Signed)
Pharmacy Antibiotic Note  Michele Weber is a 45 y.o. female admitted on 06/20/2015 with cellulitis.  Pharmacy has been consulted for vancomycin/Zosyn dosing.  Plan: Current orders for vancomycin 2gm IV Q48H and zosyn 4.5gm IV Q8H  Pt with SCr of 3.88 (trending down since previous admission), unclear if pt has chronic kidney disease - no mention of renal function in H&P. [facility], does not appear to be on HD. Looking back in chart pt had normal SCr in 09/2013.   Received vancomycin 2gm X 1 in ED last night. Will check random level 24 hours post dose. Continue current orders for vancomycin 2gm IV Q48H for now, consider adjusting based on random level.   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 Discussed with MD. Recommend obtaining ID input in regards to antibiotics due to recent history of MDR organism.  Patient is pending transfer at this time.   5/4:  Random Vanc @ 18:15 = 20 mcg/mL .   Will retime next Vanc dose to start on 5/5 @ 10:00.  Will draw another random vanc level on 5/5 @ 06:00 to assess how well pt is clearing vanc.    Height: 5' (152.4 cm) IBW/kg (Calculated) : 45.5  Temp (24hrs), Avg:97.5 F (36.4 C), Min:96.9 F (36.1 C), Max:97.9 F (36.6 C)   Recent Labs Lab 06/20/15 1711 06/21/15 0601 06/21/15 1815  WBC 7.3 6.1  --   CREATININE 3.88* 3.77*  --   LATICACIDVEN 2.0  --   --   VANCORANDOM  --   --  20    Estimated Creatinine Clearance: 38 mL/min (by C-G formula based on Cr of 3.77).    No Known Allergies  Antimicrobials this admission: 5/3 Zosyn >> 5/3 Vancomycin>>  Dose adjustments this admission:   Microbiology results: 5/3 Blood x 2 - sent 5/3 MRSA PCR negative 5/3 Wound  Thank you for allowing pharmacy to be a part of this patient's care.  Joh Rao D 06/21/2015 7:31 PM

## 2015-06-21 NOTE — Progress Notes (Signed)
I personally called [facility] transfer at [facility]'s request [facility]. [facility] at this [facility] and they do not have a bed available I will discuss with Dr. Luberta MutterKonidena further disposition

## 2015-06-21 NOTE — Clinical Social Work Note (Signed)
Clinical Social Work Assessment  Patient Details  Name: Michele Weber MRN: [id number] Date of Birth: 09/12/1970  Date of referral:  06/21/15               Reason for consult:  Facility Placement, Other (Comment Required) (From [facility] )                Permission sought to share information with:  Chartered certified accountant granted to share information::  Yes, Verbal Permission Granted  Name::      Public affairs consultant::   [place]   Relationship::     Contact Information:     Housing/Transportation Living arrangements for the past 2 months:  [place] of Information:  Patient Patient Interpreter Needed:  None Criminal Activity/Legal Involvement Pertinent to Current Situation/Hospitalization:  No - Comment as needed Significant Relationships:  Adult Children, Siblings Lives with:  Facility Resident Do you feel safe going back to the place where you live?  Yes Need for family participation in patient care:  Yes (Comment)  Care giving concerns:  Patient is a resident at [facility] and has been there for 2 weeks.    Social Worker assessment / plan:  Holiday representative (CSW) received consult that patient is from [facility]. Per [place] admissions coordinator at [facility] patient is there for short term rehab and can return when stable. CSW met with patient and her sister Michele Weber was at bedside. CSW introduced self and explained role of CSW department. Patient reported that she has been at [facility] for 2 weeks and was at [facility] for 1.5 years before that. Patient reported that she moved to [facility] to be closer to her family in [place]. Patient reported that she wants her daughter Michele Weber to be her HPOA. Patient is agreeable to return to [facility] when she is stable.   Per RN patient will transfer to [facility]. FL2 complete and faxed out.    Employment status:  Disabled (Comment on whether or not currently receiving Disability) Insurance information:  Medicare, Medicaid In [place] PT Recommendations:  Not assessed at this time Information / Referral to community resources:  [place]  Patient/Family's Response to care:  Patient is agreeable to return to [facility] when stable.   Patient/Family's Understanding of and Emotional Response to Diagnosis, Current Treatment, and Prognosis:  Patient was pleasant and thanked CSW for visit.   Emotional Assessment Appearance:  Appears stated age Attitude/Demeanor/Rapport:    Affect (typically observed):  Accepting, Adaptable, Pleasant Orientation:  Oriented to Self, Oriented to Place, Oriented to  Time, Oriented to Situation Alcohol / Substance use:  Not Applicable Psych involvement (Current and /or in the community):  No (Comment)  Discharge Needs  Concerns to be addressed:  Discharge Planning Concerns Readmission within the last 30 days:  No Current discharge risk:  Dependent with Mobility, Chronically ill Barriers to Discharge:  Continued Medical Work up   Elwyn Reach 06/21/2015, 3:48 PM

## 2015-06-21 NOTE — Progress Notes (Signed)
Status post prime doc as well as Dr. Adriana MccallumMatt  [place] will assist in [facility]. Transverseinprogress

## 2015-06-21 NOTE — Progress Notes (Signed)
Blood pressure dropped 82/51 MD called. 500cc bolus ordered

## 2015-06-21 NOTE — Progress Notes (Addendum)
[facility] -  at [facility]   PATIENT NAME: Michele MowersSharon Weber    MR#:  [id number]  DATE OF BIRTH:  [birth year]  SUBJECTIVE: Morbidly obese female admitted for severe cellulitis left inner thigh, sacrum, right leg, and left flank and has  Foul odor. Discussed with Dr. Excell Seltzerooper to see if we can now discuss with surgeon for transfer to tertiary care because patient needs surgical debridement   CHIEF COMPLAINT:   Chief Complaint  Patient presents with  . Wound Dehiscence    REVIEW OF SYSTEMS:    Review of Systems  Constitutional: Negative for fever and chills.  HENT: Negative for hearing loss.   Eyes: Negative for blurred vision, double vision and photophobia.  Respiratory: Negative for cough, hemoptysis and shortness of breath.   Cardiovascular: Negative for chest pain, palpitations, orthopnea and leg swelling.  Gastrointestinal: Negative for heartburn, vomiting, abdominal pain and diarrhea.  Genitourinary: Negative for dysuria and urgency.  Musculoskeletal: Negative for myalgias and neck pain.  Skin: Negative for rash.        Cellulitis of the left inner thigh, sacrum, right leg ,left flank and we will know how long it's.there.  Neurological: Negative for dizziness, focal weakness, seizures, weakness and headaches.  Psychiatric/Behavioral: Negative for memory loss. The patient does not have insomnia.     Nutrition: Tolerating Diet: Tolerating PT:      DRUG ALLERGIES:  No Known Allergies  VITALS:  Blood pressure 86/49, pulse 98, temperature 97.5 F (36.4 C), temperature source Oral, resp. rate 18, height 5' (1.524 m), last menstrual period 05/28/2015, SpO2 100 %.  PHYSICAL EXAMINATION:   Physical Exam  GENERAL:  45 y.o.-year-old patient lying in the bed with no acute distress.  EYES: Pupils equal, round, reactive to light and accommodation. No scleral icterus. Extraocular muscles intact.  HEENT: Head atraumatic, normocephalic. Oropharynx and  nasopharynx clear.  NECK:  Supple, no jugular venous distention. No thyroid enlargement, no tenderness.  LUNGS: Normal breath sounds bilaterally, no wheezing, rales,rhonchi or crepitation. No use of accessory muscles of respiration.  CARDIOVASCULAR: S1, S2 normal. No murmurs, rubs, or gallops.  ABDOMEN: Soft, nontender, nondistended. Bowel sounds present. No organomegaly or mass.  EXTREMITIES: No pedal edema, cyanosis, or clubbing.  NEUROLOGIC: Cranial nerves II through XII are intact. Muscle strength 5/5 in all extremities. Sensation intact. Gait not checked.  PSYCHIATRIC: The patient is alert and oriented x 3.  SKIN: No obvious rash, lesion, or ulcer.    LABORATORY PANEL:   CBC  Recent Labs Lab 06/21/15 0601  WBC 6.1  HGB 10.7*  HCT 33.3*  PLT 366   ------------------------------------------------------------------------------------------------------------------  Chemistries   Recent Labs Lab 06/20/15 1711 06/21/15 0601  NA 133* 133*  K 4.1 4.1  CL 96* 101  CO2 23 20*  GLUCOSE 107* 98  BUN 91* 92*  CREATININE 3.88* 3.77*  CALCIUM 8.7* 8.0*  AST 15  --   ALT 15  --   ALKPHOS 69  --   BILITOT 0.6  --    ------------------------------------------------------------------------------------------------------------------  Cardiac Enzymes No results for input(s): TROPONINI in the last 168 hours. ------------------------------------------------------------------------------------------------------------------  RADIOLOGY:  No results found.   ASSESSMENT AND PLAN:   Active Problems:   Cellulitis  Morbidly obese female with necrotic foul-smelling ulcers in the left leg needs debridement in the OR unfortunately the anesthesia in this hospital unable to care for patient because of her massive obesity, general anesthesia suggested transfer to tertiary facility. Discussed with Dr. Excell Seltzerooper to  initiate the  transfer because patient needs surgical debridement and the issues  is surgical issue.continue VAnco and zosyn.  2. History of atrial fibrillation on Coumadin. #3 .lymphedema. Both legs.  Called [facility] [place] transfer center and waiting for their reply. Discussed this with patient's daughter and she is agreeable for transfer to [facility] if they have a bed.[name] The medical records from [facility]: She had ACenetobacter in blood  In April when she was admitted there , she received meropenem, I requested ID consult.  All the records are reviewed and case discussed with Care Management/Social Workerr. Management plans discussed with the patient, family and they are in agreement.  CODE STATUS: full  TOTAL TIME TAKING CARE OF THIS PATIENT: 35 minutes.  D/w Dr.Cooper and he mentioned that she need surgery  And GE ,He did not suggest [facility],. pts daughter is requesting [facility] today,will talk to her.   Katha Hamming M.D on 06/21/2015 at 12:20 PM  Between 7am to 6pm - Pager - [phone]  After 6pm go to [email or web address] - password [email or web address] [facility]  [place] [facility]  Office  [phone]  CC: Primary care physician; [facility]

## 2015-06-21 NOTE — Progress Notes (Signed)
Lost iv access.  Unable to access with peripheral iv. Notified md. Order for picc line placed.

## 2015-06-21 NOTE — Consult Note (Signed)
WOC wound consult note Reason for Consult: Necrotic wounds to left inner thigh, left dorsal foot, left flank, sacrum, right lower leg.  Foul odor and surgical consult is ordered.  Surgery versus transport to another facility is being determined at this time.  Wound type:Chronic unstageable pressure injury Moisture Associated Skin Damage to back, left lower side, pink and moist Patient is on bariatric bed with turning and low air loss features.  Turning patient for assessment is still a challenge with 3 assistants.  Pressure Ulcer POA: Yes Measurement: Sacrum  Unable to visualize full wound due to size wound bed is 100% necrotic with an adherent black plug of devitalized tissue.   Left inner thigh with linear ulceration extending from upper thigh to mid leg, wound bed is 100% devitalized tissue with foul odor.  LEft dorsal foot adherent necrotic wound bed  8 cm x 6 cm in diameter.  Left flank 6 cm x 6 cm wound with 100% necrotic tissue present.   Wound ZOX:WRUEAVWUbed:Necrotic Drainage (amount, consistency, odor) Moderate purulent Foul necrotic odor.  Periwound:Moisture associated skin damage.  Patient is unable to mobilize self in bed.  Dressing procedure/placement/frequency: Will await surgical determination for plan of treatment.  Dry dressings in place at this time.   Maple HudsonKaren Loraina Stauffer RN BSN CWON Pager [phone]

## 2015-06-21 NOTE — Progress Notes (Signed)
This is a morbidly obese female patient with left leg wounds which are complex and necrotic and foul-smelling.  She's been admitted to the hospital for debridement she also has multiple medical problems including atrial fibrillation on anticoagulation and chronic renal failure.  [place] morning I discussed with her the rationale for this procedure and the risks of bleeding infection recurrence open wound cosmetic deformity and the need for further surgery. In fact I reminded her of the goals that we spoke about last night in the emergency room. Those goals are to clean up as best as possible without getting into a lot of bleeding because of her anticoagulation and remove as much of the necrotic foul-smelling tissue as is possible and plan return to the operating room tomorrow for a repeat debridement and dressing change. This is all reviewed for her she understood and agreed with this plan

## 2015-06-21 NOTE — Care Management Note (Signed)
Case Management Note  Patient Details  Name: Michele Weber MRN: [id number] Date of Birth: 1971/02/13  Subjective/Objective:                  Spoke with patient's daughter over the phone  [name],[name] Daughter [phone]  She states that patient has been at [facility] "under Medicaid for two weeks". She states that patient is there for long-term care. Patient has multiple wounds that need treating; one so bad that may lead to amputation however that has not been determined. [facility] offered to daughter and she seems to think patient has been there before. Sue Lushndrea with [facility] is checking patient's Medicare to see how many days she has left since she has been to SNF.  Apparently patient will not need 3-inpatient night stay for LTAC.   Action/Plan: RNCM to continue to follow and follow up with MD. Unit clerk asked to create transfer packet for [facility] just in case.   Expected Discharge Date:                  Expected Discharge Plan:     In-House Referral:     Discharge planning Services  CM Consult  Post Acute Care Choice:  Long Term Acute Care (LTAC) Choice offered to:  Adult Children  DME Arranged:    DME Agency:     HH Arranged:    HH Agency:     Status of Service:  In process, will continue to follow  Medicare Important Message Given:    Date Medicare IM Given:    Medicare IM give by:    Date Additional Medicare IM Given:    Additional Medicare Important Message give by:     If discussed at Long Length of Stay Meetings, dates discussed:    Additional Comments:  Collie Siadngela Fredy Gladu, RN 06/21/2015, 11:52 AM

## 2015-06-21 NOTE — Progress Notes (Signed)
   06/21/15 0845  Clinical Encounter Type  Visited With Patient  Visit Type Initial;Pre-op  Referral From Nurse  Consult/Referral To Chaplain  Spiritual Encounters  Spiritual Needs Prayer  Stress Factors  Patient Stress Factors Exhausted;Health changes  Visited w/patient prior to surgery. Provided pastoral care & prayer. Chap. Emojean Gertz G. Kaidance Pantoja, ext. [phone]

## 2015-06-21 NOTE — NC FL2 (Signed)
[place] MEDICAID FL2 LEVEL OF CARE SCREENING TOOL     IDENTIFICATION  Patient Name: Michele Weber Birthdate: 10-Apr-1970 Sex: female Admission Date (Current Location): 06/20/2015  [facility] and [place] Number:  [address]  ([id number]) Facility and Address:  [facility], [address], [place], [place] [address]      Provider Number: [id number]  Attending Physician Name and Address:  Katha Hamming, MD  Relative Name and Phone Number:       Current Level of Care: Hospital Recommended Level of Care: Skilled Nursing Facility Prior Approval Number:    Date Approved/Denied:   PASRR Number:    Discharge Plan: SNF    Current Diagnoses: Patient Active Problem List   Diagnosis Date Noted  . Cellulitis 06/20/2015  . Wounds, multiple open, lower extremity   . Encounter for central line placement   . Abscess   . Encounter for nasogastric (NG) tube placement   . Encounter for hospice care discussion   . 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 Goals of care, counseling/discussion   . Acute renal failure with tubular necrosis (HCC)   . Palliative care encounter   . Acute respiratory failure (HCC)   . Septic shock (HCC)   . Sepsis (HCC) 05/23/2015  . Unspecified hypothyroidism 10/04/2013  . Hypotension 09/30/2013  . Edema 09/14/2013  . Atrial fibrillation with RVR (HCC) 08/11/2013  . Cellulitis and abscess of trunk 07/21/2013  . Abnormal weight gain 07/08/2013  . Anemia, unspecified 07/08/2013  . Encounter for long-term (current) use of other medications 07/04/2013  . Amenorrhea 06/07/2013  . Open wound of knee, leg (except thigh), and ankle, complicated 06/03/2013  . Hypokalemia 05/12/2013  . Unspecified protein-calorie malnutrition (HCC) 05/12/2013  . Physical deconditioning 05/12/2013  . Morbid obesity (HCC)   . Lymphedema of both lower extremities   . Diastolic CHF (HCC)   . Vitamin D deficiency     Orientation RESPIRATION  BLADDER Height & Weight     Time, Situation, Place, Self  Normal Incontinent, Indwelling catheter Weight:   Height:  5' (152.4 cm)  BEHAVIORAL SYMPTOMS/MOOD NEUROLOGICAL BOWEL NUTRITION STATUS   (none )  (none ) Incontinent Diet (Diet: Heart Healthy )  AMBULATORY STATUS COMMUNICATION OF NEEDS Skin   Extensive Assist Verbally Other (Comment) (Large Open Weeping Wounds extending from left thigh to left foot. Cellulitis and abscessbilateral legs)                       Personal Care Assistance Level of Assistance  Bathing, Feeding, Dressing Bathing Assistance: Maximum assistance Feeding assistance: Limited assistance Dressing Assistance: Maximum assistance     Functional Limitations Info  Sight, Hearing, Speech Sight Info: Impaired Hearing Info: Adequate Speech Info: Adequate    SPECIAL CARE FACTORS FREQUENCY                       Contractures      Additional Factors Info  Code Status, Allergies, Insulin Sliding Scale Code Status Info:  (Full Code. ) Allergies Info:  (No Known Allergies. )   Insulin Sliding Scale Info:  (NovoLog Insulin Injections 3 times daily )       Current Medications (06/21/2015):  This is the current hospital active medication list Current Facility-Administered Medications  Medication Dose Route Frequency Provider Last Rate Last Dose  . 0.9 %  sodium chloride infusion   Intravenous Continuous Shaune Pollack, MD 50 mL/hr at 06/21/15 0548    . acetaminophen (TYLENOL) tablet  650 mg  650 mg Oral Q6H PRN Shaune Pollack, MD       Or  . acetaminophen (TYLENOL) suppository 650 mg  650 mg Rectal Q6H PRN Shaune Pollack, MD      . albuterol (PROVENTIL) (2.5 MG/3ML) 0.083% nebulizer solution 2.5 mg  2.5 mg Nebulization Q2H PRN Shaune Pollack, MD      . clindamycin (CLEOCIN) IVPB 900 mg  900 mg Intravenous Q8H Sharman Cheek, MD 100 mL/hr at 06/21/15 0548 900 mg at 06/21/15 0548  . docusate sodium (COLACE) capsule 100 mg  100 mg Oral Daily PRN Shaune Pollack, MD      .  famotidine (PEPCID) tablet 20 mg  20 mg Oral QHS Shaune Pollack, MD   20 mg at 06/20/15 2230  . fluticasone (FLONASE) 50 MCG/ACT nasal spray 1 spray  1 spray Each Nare Daily PRN Shaune Pollack, MD      . guaiFENesin [facility]) 12 hr tablet 600 mg  600 mg Oral BID Shaune Pollack, MD   600 mg at 06/20/15 2231  . guaifenesin (ROBITUSSIN) 100 MG/5ML syrup 200 mg  200 mg Oral Q6H PRN Shaune Pollack, MD      . HYDROcodone-acetaminophen (NORCO/VICODIN) 5-325 MG per tablet 1 tablet  1 tablet Oral Q4H PRN Shaune Pollack, MD   1 tablet at 06/20/15 2241  . insulin aspart (novoLOG) injection 0-5 Units  0-5 Units Subcutaneous QHS Shaune Pollack, MD   0 Units at 06/20/15 2249  . insulin aspart (novoLOG) injection 0-9 Units  0-9 Units Subcutaneous TID WC Shaune Pollack, MD   0 Units at 06/21/15 0800  . ipratropium-albuterol (DUONEB) 0.5-2.5 (3) MG/3ML nebulizer solution 3 mL  3 mL Nebulization Q6H PRN Shaune Pollack, MD      . levothyroxine (SYNTHROID, LEVOTHROID) tablet 50 mcg  50 mcg Oral QAC breakfast Shaune Pollack, MD   50 mcg at 06/21/15 0800  . loperamide (IMODIUM) capsule 2 mg  2 mg Oral Q6H PRN Shaune Pollack, MD      . loratadine (CLARITIN) tablet 10 mg  10 mg Oral Daily Shaune Pollack, MD   10 mg at 06/20/15 2229  . morphine 4 MG/ML injection 4 mg  4 mg Intravenous Q4H PRN Shaune Pollack, MD      . ondansetron [facility]) tablet 4 mg  4 mg Oral Q6H PRN Shaune Pollack, MD       Or  . ondansetron [facility]) injection 4 mg  4 mg Intravenous Q6H PRN Shaune Pollack, MD   4 mg at 06/21/15 1253  . oxyCODONE (Oxy IR/ROXICODONE) immediate release tablet 5 mg  5 mg Oral Q4H PRN Shaune Pollack, MD   5 mg at 06/21/15 1411  . pantoprazole (PROTONIX) EC tablet 40 mg  40 mg Oral Daily Shaune Pollack, MD   40 mg at 06/20/15 2229  . piperacillin-tazobactam (ZOSYN) IVPB 4.5 g  4.5 g Intravenous Q8H Shaune Pollack, MD   4.5 g at 06/21/15 0548  . potassium chloride SA (K-DUR,KLOR-CON) CR tablet 20 mEq  20 mEq Oral Daily Shaune Pollack, MD   20 mEq at 06/21/15 1411  . saccharomyces boulardii (FLORASTOR) capsule 250  mg  250 mg Oral BID Shaune Pollack, MD   250 mg at 06/21/15 1411  . silver sulfADIAZINE (SILVADENE) 1 % cream   Topical BID Lattie Haw, MD      . simethicone [facility]) chewable tablet 80 mg  80 mg Oral Q6H PRN Shaune Pollack, MD      . topiramate (TOPAMAX) tablet 50 mg  50 mg Oral BID Shaune PollackQing Chen, MD   50 mg at 06/21/15 1411  . vancomycin (VANCOCIN) 2,000 mg in sodium chloride 0.9 % 500 mL IVPB  2,000 mg Intravenous Q48H Shaune PollackQing Chen, MD         Discharge Medications: Please see discharge summary for a list of discharge medications.  Relevant Imaging Results:  Relevant Lab Results:   Additional Information  (SSN: [id number])  Haig ProphetMorgan, Brysin Towery G, LCSW

## 2015-06-21 NOTE — Clinical Documentation Improvement (Signed)
  Hospitalist  Would you please clarify medical condition related to clinical findings?   Acute Renal Failure/Acute Kidney Injury  Acute Tubular Necrosis  Acute Renal Cortical Necrosis  Acute Renal Medullary Necrosis  Other  Clinically Undetermined  Document any associated diagnoses/conditions.   Supporting Information: On admission BUN 91 and then 92; Creatinine - 3.88 and then 3.77; GFR 16   Please exercise your independent, professional judgment when responding. A specific answer is not anticipated or expected.   Thank Modesta MessingYou,  Brettney Ficken L [facility] Information Management [place] [phone]

## 2015-06-21 NOTE — Progress Notes (Signed)
ANTICOAGULATION CONSULT NOTE - Initial Consult  Pharmacy Consult for warfarin Indication: atrial fibrillation  No Known Allergies  Patient Measurements: Height: 5' (152.4 cm) IBW/kg (Calculated) : 45.5   Vital Signs: Temp: 97.5 F (36.4 C) (05/04 0733) Temp Source: Oral (05/04 0733) BP: 77/57 mmHg (05/04 1306) Pulse Rate: 94 (05/04 1306)  Labs:  Recent Labs  06/20/15 1711 06/20/15 2036 06/21/15 0601  HGB 11.6*  --  10.7*  HCT 35.8  --  33.3*  PLT 424  --  366  LABPROT  --  23.8* 26.4*  INR  --  2.15 2.46  CREATININE 3.88*  --  3.77*  CKTOTAL 33*  --   --     Estimated Creatinine Clearance: 38 mL/min (by C-G formula based on Cr of 3.77).   Medical History: Past Medical History  Diagnosis Date  . Morbid obesity (HCC)   . Lymphedema of both lower extremities   . Diastolic CHF (HCC)   . Vitamin D deficiency   . Atrial fibrillation (HCC)   . Vitamin D deficiency   . Sepsis (HCC) 05/2015  . Anemia     unspecified  . Cellulitis and abscess     bilateral legs     Assessment: 45 yo female on warfarin for AFib. Pt could not state what her dose is, unclear when last dose was taken. Pt from [facility] and med list states warfarin 5 mg daily.   Patient admitted for necrotizing cellulitis, needs surgery. Unable to perform here, pending transfer.    Goal of Therapy:  INR 2-3 Monitor platelets by anticoagulation protocol: Yes   Plan:  Discussed with Dr. Luberta MutterKonidena. Warfarin held last night in anticipation of surgery. Not able to have surgery here, pending transfer. Dr. Luberta MutterKonidena would like to continue holding warfarin as patient is expected to have debridement at some point in the near future.   Will check INR tomorrow AM and continue to follow.   Pharmacy will continue to follow.   Draya Felker C 06/21/2015,1:57 PM

## 2015-06-21 NOTE — Care Management (Signed)
Patient and her sister agree to transfer to [facility] today. Message left for Dr. Luberta MutterKonidena. EMTALA needed for [facility] transport.

## 2015-06-22 ENCOUNTER — Encounter (HOSPITAL_COMMUNITY): Payer: Self-pay | Admitting: Certified Registered Nurse Anesthetist

## 2015-06-22 ENCOUNTER — Encounter (HOSPITAL_COMMUNITY)
Admission: AD | Disposition: A | Discharge status: Nursing Home (Includes ICF) | Payer: Self-pay | Source: Other Acute Inpatient Hospital | Attending: Pulmonary Disease

## 2015-06-22 ENCOUNTER — Inpatient Hospital Stay (HOSPITAL_COMMUNITY)
Admission: AD | Admit: 2015-06-22 | Discharge: 2015-06-29 | DRG: 853 | Disposition: A | Discharge status: Other Acute Inpatient Hospital | Payer: Medicare Other | Source: Other Acute Inpatient Hospital | Attending: Family Medicine | Admitting: Family Medicine

## 2015-06-22 ENCOUNTER — Inpatient Hospital Stay (HOSPITAL_COMMUNITY): Payer: Medicare Other | Admitting: Certified Registered Nurse Anesthetist

## 2015-06-22 ENCOUNTER — Inpatient Hospital Stay (HOSPITAL_COMMUNITY): Payer: Medicare Other

## 2015-06-22 DIAGNOSIS — J811 Chronic pulmonary edema: Secondary | ICD-10-CM

## 2015-06-22 DIAGNOSIS — Z7401 Bed confinement status: Secondary | ICD-10-CM | POA: Diagnosis not present

## 2015-06-22 DIAGNOSIS — Z6841 Body Mass Index (BMI) 40.0 and over, adult: Secondary | ICD-10-CM

## 2015-06-22 DIAGNOSIS — N170 Acute kidney failure with tubular necrosis: Secondary | ICD-10-CM | POA: Diagnosis present

## 2015-06-22 DIAGNOSIS — I5032 Chronic diastolic (congestive) heart failure: Secondary | ICD-10-CM | POA: Diagnosis present

## 2015-06-22 DIAGNOSIS — N179 Acute kidney failure, unspecified: Secondary | ICD-10-CM | POA: Insufficient documentation

## 2015-06-22 DIAGNOSIS — K219 Gastro-esophageal reflux disease without esophagitis: Secondary | ICD-10-CM | POA: Diagnosis present

## 2015-06-22 DIAGNOSIS — E870 Hyperosmolality and hypernatremia: Secondary | ICD-10-CM | POA: Diagnosis not present

## 2015-06-22 DIAGNOSIS — R6521 Severe sepsis with septic shock: Secondary | ICD-10-CM | POA: Diagnosis present

## 2015-06-22 DIAGNOSIS — M726 Necrotizing fasciitis: Secondary | ICD-10-CM | POA: Diagnosis present

## 2015-06-22 DIAGNOSIS — B9689 Other specified bacterial agents as the cause of diseases classified elsewhere: Secondary | ICD-10-CM | POA: Diagnosis present

## 2015-06-22 DIAGNOSIS — L304 Erythema intertrigo: Secondary | ICD-10-CM | POA: Diagnosis present

## 2015-06-22 DIAGNOSIS — D62 Acute posthemorrhagic anemia: Secondary | ICD-10-CM | POA: Diagnosis not present

## 2015-06-22 DIAGNOSIS — L02416 Cutaneous abscess of left lower limb: Secondary | ICD-10-CM | POA: Diagnosis present

## 2015-06-22 DIAGNOSIS — G4733 Obstructive sleep apnea (adult) (pediatric): Secondary | ICD-10-CM | POA: Insufficient documentation

## 2015-06-22 DIAGNOSIS — E662 Morbid (severe) obesity with alveolar hypoventilation: Secondary | ICD-10-CM | POA: Diagnosis present

## 2015-06-22 DIAGNOSIS — I482 Chronic atrial fibrillation: Secondary | ICD-10-CM | POA: Diagnosis present

## 2015-06-22 DIAGNOSIS — Z79899 Other long term (current) drug therapy: Secondary | ICD-10-CM

## 2015-06-22 DIAGNOSIS — D689 Coagulation defect, unspecified: Secondary | ICD-10-CM | POA: Diagnosis present

## 2015-06-22 DIAGNOSIS — E039 Hypothyroidism, unspecified: Secondary | ICD-10-CM | POA: Diagnosis present

## 2015-06-22 DIAGNOSIS — L89151 Pressure ulcer of sacral region, stage 1: Secondary | ICD-10-CM | POA: Diagnosis present

## 2015-06-22 DIAGNOSIS — E872 Acidosis: Secondary | ICD-10-CM | POA: Diagnosis present

## 2015-06-22 DIAGNOSIS — L8990 Pressure ulcer of unspecified site, unspecified stage: Secondary | ICD-10-CM | POA: Insufficient documentation

## 2015-06-22 DIAGNOSIS — N184 Chronic kidney disease, stage 4 (severe): Secondary | ICD-10-CM | POA: Insufficient documentation

## 2015-06-22 DIAGNOSIS — A419 Sepsis, unspecified organism: Secondary | ICD-10-CM | POA: Diagnosis present

## 2015-06-22 DIAGNOSIS — E876 Hypokalemia: Secondary | ICD-10-CM | POA: Diagnosis not present

## 2015-06-22 DIAGNOSIS — M7989 Other specified soft tissue disorders: Secondary | ICD-10-CM | POA: Diagnosis present

## 2015-06-22 DIAGNOSIS — J9600 Acute respiratory failure, unspecified whether with hypoxia or hypercapnia: Secondary | ICD-10-CM | POA: Diagnosis not present

## 2015-06-22 DIAGNOSIS — I890 Lymphedema, not elsewhere classified: Secondary | ICD-10-CM | POA: Diagnosis present

## 2015-06-22 DIAGNOSIS — E43 Unspecified severe protein-calorie malnutrition: Secondary | ICD-10-CM | POA: Diagnosis present

## 2015-06-22 DIAGNOSIS — N189 Chronic kidney disease, unspecified: Secondary | ICD-10-CM

## 2015-06-22 DIAGNOSIS — J9571 Accidental puncture and laceration of a respiratory system organ or structure during a respiratory system procedure: Secondary | ICD-10-CM | POA: Diagnosis not present

## 2015-06-22 DIAGNOSIS — J95821 Acute postprocedural respiratory failure: Secondary | ICD-10-CM | POA: Diagnosis not present

## 2015-06-22 DIAGNOSIS — J9601 Acute respiratory failure with hypoxia: Secondary | ICD-10-CM | POA: Diagnosis not present

## 2015-06-22 DIAGNOSIS — Z0189 Encounter for other specified special examinations: Secondary | ICD-10-CM | POA: Diagnosis not present

## 2015-06-22 DIAGNOSIS — Z8619 Personal history of other infectious and parasitic diseases: Secondary | ICD-10-CM | POA: Diagnosis not present

## 2015-06-22 DIAGNOSIS — Y848 Other medical procedures as the cause of abnormal reaction of the patient, or of later complication, without mention of misadventure at the time of the procedure: Secondary | ICD-10-CM | POA: Diagnosis not present

## 2015-06-22 DIAGNOSIS — T45515A Adverse effect of anticoagulants, initial encounter: Secondary | ICD-10-CM | POA: Diagnosis present

## 2015-06-22 DIAGNOSIS — Z978 Presence of other specified devices: Secondary | ICD-10-CM | POA: Insufficient documentation

## 2015-06-22 DIAGNOSIS — L03116 Cellulitis of left lower limb: Secondary | ICD-10-CM | POA: Diagnosis present

## 2015-06-22 DIAGNOSIS — Z789 Other specified health status: Secondary | ICD-10-CM | POA: Diagnosis not present

## 2015-06-22 DIAGNOSIS — E871 Hypo-osmolality and hyponatremia: Secondary | ICD-10-CM | POA: Diagnosis present

## 2015-06-22 DIAGNOSIS — Z1624 Resistance to multiple antibiotics: Secondary | ICD-10-CM | POA: Diagnosis not present

## 2015-06-22 DIAGNOSIS — D638 Anemia in other chronic diseases classified elsewhere: Secondary | ICD-10-CM | POA: Diagnosis present

## 2015-06-22 DIAGNOSIS — I4891 Unspecified atrial fibrillation: Secondary | ICD-10-CM | POA: Diagnosis not present

## 2015-06-22 HISTORY — PX: WOUND DEBRIDEMENT: SHX247

## 2015-06-22 LAB — BLOOD GAS, ARTERIAL
Acid-base deficit: 6.4 mmol/L — ABNORMAL HIGH (ref 0.0–2.0)
BICARBONATE: 18.9 meq/L — AB (ref 20.0–24.0)
FIO2: 0.4
LHR: 16 {breaths}/min
O2 Saturation: 99.3 %
PEEP/CPAP: 5 cmH2O
Patient temperature: 97.4
TCO2: 20.1 mmol/L (ref 0–100)
VT: 550 mL
pCO2 arterial: 38.5 mmHg (ref 35.0–45.0)
pH, Arterial: 7.306 — ABNORMAL LOW (ref 7.350–7.450)
pO2, Arterial: 210 mmHg — ABNORMAL HIGH (ref 80.0–100.0)

## 2015-06-22 LAB — BASIC METABOLIC PANEL
ANION GAP: 16 — AB (ref 5–15)
BUN: 85 mg/dL — ABNORMAL HIGH (ref 6–20)
CALCIUM: 8 mg/dL — AB (ref 8.9–10.3)
CO2: 19 mmol/L — AB (ref 22–32)
Chloride: 103 mmol/L (ref 101–111)
Creatinine, Ser: 3.64 mg/dL — ABNORMAL HIGH (ref 0.44–1.00)
GFR calc Af Amer: 16 mL/min — ABNORMAL LOW (ref >60)
GFR calc non Af Amer: 14 mL/min — ABNORMAL LOW (ref >60)
GLUCOSE: 143 mg/dL — AB (ref 65–99)
Potassium: 4.1 mmol/L (ref 3.5–5.1)
Sodium: 138 mmol/L (ref 135–145)

## 2015-06-22 LAB — CBC
HEMATOCRIT: 36.8 % (ref 36.0–46.0)
Hemoglobin: 11.3 g/dL — ABNORMAL LOW (ref 12.0–15.0)
MCH: 30.5 pg (ref 26.0–34.0)
MCHC: 30.7 g/dL (ref 30.0–36.0)
MCV: 99.5 fL (ref 78.0–100.0)
Platelets: 413 10*3/uL — ABNORMAL HIGH (ref 150–400)
RBC: 3.7 MIL/uL — ABNORMAL LOW (ref 3.87–5.11)
RDW: 17.6 % — AB (ref 11.5–15.5)
WBC: 10.5 10*3/uL (ref 4.0–10.5)

## 2015-06-22 LAB — LACTIC ACID, PLASMA: Lactic Acid, Venous: 1.4 mmol/L (ref 0.5–2.0)

## 2015-06-22 LAB — POCT I-STAT 7, (LYTES, BLD GAS, ICA,H+H)
Acid-base deficit: 4 mmol/L — ABNORMAL HIGH (ref 0.0–2.0)
Bicarbonate: 21.8 mEq/L (ref 20.0–24.0)
CALCIUM ION: 1.05 mmol/L — AB (ref 1.12–1.23)
HEMATOCRIT: 34 % — AB (ref 36.0–46.0)
HEMOGLOBIN: 11.6 g/dL — AB (ref 12.0–15.0)
O2 SAT: 100 %
PCO2 ART: 39.2 mmHg (ref 35.0–45.0)
POTASSIUM: 4.1 mmol/L (ref 3.5–5.1)
Patient temperature: 36
SODIUM: 137 mmol/L (ref 135–145)
TCO2: 23 mmol/L (ref 0–100)
pH, Arterial: 7.348 — ABNORMAL LOW (ref 7.350–7.450)
pO2, Arterial: 561 mmHg — ABNORMAL HIGH (ref 80.0–100.0)

## 2015-06-22 LAB — TSH: TSH: 8.854 u[IU]/mL — ABNORMAL HIGH (ref 0.350–4.500)

## 2015-06-22 LAB — PROTIME-INR
INR: 3.34 — AB (ref 0.00–1.49)
INR: 1.8 — ABNORMAL HIGH (ref 0.00–1.49)
INR: 1.43 (ref 0.00–1.49)
PROTHROMBIN TIME: 33.2 s — AB (ref 11.6–15.2)
Prothrombin Time: 20.9 seconds — ABNORMAL HIGH (ref 11.6–15.2)
Prothrombin Time: 17.5 seconds — ABNORMAL HIGH (ref 11.6–15.2)

## 2015-06-22 LAB — ABO/RH: ABO/RH(D): B POS

## 2015-06-22 LAB — PHOSPHORUS: Phosphorus: 5.9 mg/dL — ABNORMAL HIGH (ref 2.5–4.6)

## 2015-06-22 LAB — TYPE AND SCREEN
ABO/RH(D): B POS
Antibody Screen: NEGATIVE

## 2015-06-22 LAB — GLUCOSE, CAPILLARY: Glucose-Capillary: 124 mg/dL — ABNORMAL HIGH (ref 65–99)

## 2015-06-22 LAB — VANCOMYCIN, RANDOM: VANCOMYCIN RM: 13 ug/mL

## 2015-06-22 LAB — MAGNESIUM: Magnesium: 1.6 mg/dL — ABNORMAL LOW (ref 1.7–2.4)

## 2015-06-22 SURGERY — DEBRIDEMENT, WOUND
Anesthesia: General | Site: Leg Lower | Laterality: Left

## 2015-06-22 MED ORDER — AMIKACIN SULFATE 500 MG/2ML IJ SOLN
1000.0000 mg | INTRAMUSCULAR | Status: DC
  Filled 2015-06-22: qty 4

## 2015-06-22 MED ORDER — FENTANYL CITRATE (PF) 250 MCG/5ML IJ SOLN
INTRAMUSCULAR | Status: AC
  Filled 2015-06-22: qty 5

## 2015-06-22 MED ORDER — SODIUM CHLORIDE 0.9 % IV SOLN
25.0000 ug/h | INTRAVENOUS | Status: DC
  Filled 2015-06-22: qty 50

## 2015-06-22 MED ORDER — SODIUM CHLORIDE 0.9 % IV SOLN
2500.0000 mg | INTRAVENOUS | Status: DC
  Administered 2015-06-22: 2500 mg via INTRAVENOUS
  Filled 2015-06-22: qty 2500

## 2015-06-22 MED ORDER — LACTATED RINGERS IV SOLN
INTRAVENOUS | Status: DC | PRN
  Administered 2015-06-22: 16:00:00 via INTRAVENOUS

## 2015-06-22 MED ORDER — CETYLPYRIDINIUM CHLORIDE 0.05 % MT LIQD
7.0000 mL | Freq: Two times a day (BID) | OROMUCOSAL | Status: DC
  Administered 2015-06-22 – 2015-06-23 (×3): 7 mL via OROMUCOSAL

## 2015-06-22 MED ORDER — VITAMIN K1 10 MG/ML IJ SOLN
10.0000 mg | Freq: Once | INTRAVENOUS | Status: AC
  Administered 2015-06-22: 10 mg via INTRAVENOUS
  Filled 2015-06-22: qty 1

## 2015-06-22 MED ORDER — MIDAZOLAM HCL 5 MG/5ML IJ SOLN
INTRAMUSCULAR | Status: DC | PRN
  Administered 2015-06-22: 2 mg via INTRAVENOUS

## 2015-06-22 MED ORDER — NOREPINEPHRINE BITARTRATE 1 MG/ML IV SOLN
4000.0000 ug | INTRAVENOUS | Status: DC | PRN
  Administered 2015-06-22: 20 ug/min via INTRAVENOUS

## 2015-06-22 MED ORDER — HEPARIN SODIUM (PORCINE) 5000 UNIT/ML IJ SOLN
5000.0000 [IU] | Freq: Three times a day (TID) | INTRAMUSCULAR | Status: DC
  Administered 2015-06-22: 5000 [IU] via SUBCUTANEOUS
  Filled 2015-06-22 (×2): qty 1

## 2015-06-22 MED ORDER — SODIUM CHLORIDE 0.9 % IV SOLN
INTRAVENOUS | Status: DC
  Administered 2015-06-22 – 2015-06-23 (×2): via INTRAVENOUS

## 2015-06-22 MED ORDER — SODIUM CHLORIDE 0.9 % IV SOLN
3.0000 g | Freq: Three times a day (TID) | INTRAVENOUS | Status: DC
  Administered 2015-06-22 – 2015-06-23 (×3): 3 g via INTRAVENOUS
  Filled 2015-06-22 (×8): qty 3

## 2015-06-22 MED ORDER — NOREPINEPHRINE BITARTRATE 1 MG/ML IV SOLN
0.0000 ug/min | INTRAVENOUS | Status: DC
  Administered 2015-06-22 (×2): 20 ug/min via INTRAVENOUS
  Administered 2015-06-22: 17 ug/min via INTRAVENOUS
  Filled 2015-06-22 (×5): qty 4

## 2015-06-22 MED ORDER — FENTANYL CITRATE (PF) 100 MCG/2ML IJ SOLN
INTRAMUSCULAR | Status: DC | PRN
  Administered 2015-06-22: 50 ug via INTRAVENOUS
  Administered 2015-06-22: 100 ug via INTRAVENOUS
  Administered 2015-06-22 (×2): 50 ug via INTRAVENOUS

## 2015-06-22 MED ORDER — LEVOTHYROXINE SODIUM 100 MCG IV SOLR
25.0000 ug | Freq: Every day | INTRAVENOUS | Status: DC
  Administered 2015-06-22 – 2015-06-24 (×3): 25 ug via INTRAVENOUS
  Filled 2015-06-22 (×4): qty 5

## 2015-06-22 MED ORDER — DEXMEDETOMIDINE HCL IN NACL 400 MCG/100ML IV SOLN
0.4000 ug/kg/h | INTRAVENOUS | Status: DC
  Administered 2015-06-22: 0.4 ug/kg/h via INTRAVENOUS
  Administered 2015-06-23 (×2): 0.3 ug/kg/h via INTRAVENOUS
  Filled 2015-06-22 (×3): qty 100

## 2015-06-22 MED ORDER — SODIUM CHLORIDE 0.9 % IV SOLN
250.0000 mL | INTRAVENOUS | Status: DC | PRN
  Administered 2015-06-22 – 2015-06-23 (×2): 250 mL via INTRAVENOUS

## 2015-06-22 MED ORDER — DEXTROSE 5 % IV SOLN
2.0000 g | Freq: Two times a day (BID) | INTRAVENOUS | Status: DC
  Administered 2015-06-22: 2 g via INTRAVENOUS
  Filled 2015-06-22 (×2): qty 2

## 2015-06-22 MED ORDER — CHLORHEXIDINE GLUCONATE 0.12 % MT SOLN
15.0000 mL | Freq: Two times a day (BID) | OROMUCOSAL | Status: DC
  Administered 2015-06-22 (×2): 15 mL via OROMUCOSAL
  Filled 2015-06-22: qty 15

## 2015-06-22 MED ORDER — PROPOFOL 10 MG/ML IV BOLUS
INTRAVENOUS | Status: AC
  Filled 2015-06-22: qty 40

## 2015-06-22 MED ORDER — SUCCINYLCHOLINE CHLORIDE 20 MG/ML IJ SOLN
INTRAMUSCULAR | Status: DC | PRN
  Administered 2015-06-22: 200 mg via INTRAVENOUS

## 2015-06-22 MED ORDER — SODIUM CHLORIDE 0.9 % IV SOLN
Freq: Once | INTRAVENOUS | Status: AC
  Administered 2015-06-22: 13:00:00 via INTRAVENOUS

## 2015-06-22 MED ORDER — PANTOPRAZOLE SODIUM 40 MG IV SOLR
40.0000 mg | INTRAVENOUS | Status: DC
  Administered 2015-06-22 – 2015-06-26 (×5): 40 mg via INTRAVENOUS
  Filled 2015-06-22 (×6): qty 40

## 2015-06-22 MED ORDER — 0.9 % SODIUM CHLORIDE (POUR BTL) OPTIME
TOPICAL | Status: DC | PRN
  Administered 2015-06-22: 1000 mL

## 2015-06-22 MED ORDER — SODIUM CHLORIDE 0.9% FLUSH
10.0000 mL | Freq: Two times a day (BID) | INTRAVENOUS | Status: DC
  Administered 2015-06-23 (×2): 30 mL
  Administered 2015-06-24 – 2015-06-27 (×7): 10 mL
  Administered 2015-06-28: 20 mL

## 2015-06-22 MED ORDER — DEXTROSE 5 % IV SOLN
1050.0000 mg | Freq: Once | INTRAVENOUS | Status: AC
  Administered 2015-06-22: 1050 mg via INTRAVENOUS
  Filled 2015-06-22 (×2): qty 4.2

## 2015-06-22 MED ORDER — DEXTROSE 5 % IV SOLN
0.0000 ug/min | INTRAVENOUS | Status: DC
  Administered 2015-06-22: 20 ug/min via INTRAVENOUS
  Administered 2015-06-23: 15 ug/min via INTRAVENOUS
  Administered 2015-06-23: 13 ug/min via INTRAVENOUS
  Administered 2015-06-23: 14 ug/min via INTRAVENOUS
  Administered 2015-06-24: 6 ug/min via INTRAVENOUS
  Filled 2015-06-22 (×6): qty 4

## 2015-06-22 MED ORDER — SODIUM CHLORIDE 0.9 % IV SOLN
Freq: Once | INTRAVENOUS | Status: AC
  Administered 2015-06-24: 10 mL/h via INTRAVENOUS

## 2015-06-22 MED ORDER — DEXMEDETOMIDINE HCL IN NACL 200 MCG/50ML IV SOLN
INTRAVENOUS | Status: DC | PRN
  Administered 2015-06-22: .4 ug/kg/h via INTRAVENOUS

## 2015-06-22 MED ORDER — CLINDAMYCIN PHOSPHATE 900 MG/50ML IV SOLN
900.0000 mg | Freq: Three times a day (TID) | INTRAVENOUS | Status: DC
  Administered 2015-06-22 – 2015-06-23 (×4): 900 mg via INTRAVENOUS
  Filled 2015-06-22 (×6): qty 50

## 2015-06-22 MED ORDER — SODIUM CHLORIDE 0.9% FLUSH
10.0000 mL | INTRAVENOUS | Status: DC | PRN
  Administered 2015-06-24: 10 mL
  Filled 2015-06-22: qty 40

## 2015-06-22 MED ORDER — DEXMEDETOMIDINE HCL IN NACL 200 MCG/50ML IV SOLN
0.4000 ug/kg/h | INTRAVENOUS | Status: DC
  Administered 2015-06-22: 0.4 ug/kg/h via INTRAVENOUS
  Filled 2015-06-22 (×2): qty 50

## 2015-06-22 MED ORDER — MIDAZOLAM HCL 2 MG/2ML IJ SOLN
INTRAMUSCULAR | Status: AC
  Filled 2015-06-22: qty 2

## 2015-06-22 MED ORDER — PIPERACILLIN-TAZOBACTAM 3.375 G IVPB
3.3750 g | Freq: Three times a day (TID) | INTRAVENOUS | Status: DC
  Administered 2015-06-22: 3.375 g via INTRAVENOUS
  Filled 2015-06-22 (×2): qty 50

## 2015-06-22 MED ORDER — PROPOFOL 10 MG/ML IV BOLUS
INTRAVENOUS | Status: DC | PRN
  Administered 2015-06-22: 180 mg via INTRAVENOUS

## 2015-06-22 MED ORDER — SODIUM CHLORIDE 0.9 % IR SOLN
Status: DC | PRN
  Administered 2015-06-22: 3000 mL

## 2015-06-22 MED FILL — Norepinephrine Bitartrate IV Soln 1 MG/ML (Base Equivalent): INTRAVENOUS | Qty: 4 | Status: AC

## 2015-06-22 SURGICAL SUPPLY — 37 items
BNDG GAUZE ELAST 4 BULKY (GAUZE/BANDAGES/DRESSINGS) ×30 IMPLANT
COVER SURGICAL LIGHT HANDLE (MISCELLANEOUS) ×3 IMPLANT
DRSG PAD ABDOMINAL 8X10 ST (GAUZE/BANDAGES/DRESSINGS) ×21 IMPLANT
ELECT BLADE 4.0 EZ CLEAN MEGAD (MISCELLANEOUS) ×3
ELECT CAUTERY BLADE 6.4 (BLADE) ×6 IMPLANT
ELECT REM PT RETURN 9FT ADLT (ELECTROSURGICAL) ×3
ELECTRODE BLDE 4.0 EZ CLN MEGD (MISCELLANEOUS) ×1 IMPLANT
ELECTRODE REM PT RTRN 9FT ADLT (ELECTROSURGICAL) ×1 IMPLANT
GLOVE BIO SURGEON STRL SZ 6.5 (GLOVE) ×4 IMPLANT
GLOVE BIO SURGEON STRL SZ7 (GLOVE) ×6 IMPLANT
GLOVE BIO SURGEONS STRL SZ 6.5 (GLOVE) ×2
GLOVE BIOGEL PI IND STRL 7.0 (GLOVE) ×1 IMPLANT
GLOVE BIOGEL PI IND STRL 7.5 (GLOVE) ×2 IMPLANT
GLOVE BIOGEL PI INDICATOR 7.0 (GLOVE) ×2
GLOVE BIOGEL PI INDICATOR 7.5 (GLOVE) ×4
GLOVE SURG SS PI 6.5 STRL IVOR (GLOVE) ×3 IMPLANT
GOWN STRL REUS W/ TWL LRG LVL3 (GOWN DISPOSABLE) ×3 IMPLANT
GOWN STRL REUS W/TWL LRG LVL3 (GOWN DISPOSABLE) ×6
HANDPIECE INTERPULSE COAX TIP (DISPOSABLE) ×2
KIT BASIN OR (CUSTOM PROCEDURE TRAY) ×3 IMPLANT
KIT ROOM TURNOVER OR (KITS) ×3 IMPLANT
LIGASURE IMPACT 36 18CM CVD LR (INSTRUMENTS) ×3 IMPLANT
NS IRRIG 1000ML POUR BTL (IV SOLUTION) ×3 IMPLANT
PACK SURGICAL SETUP 50X90 (CUSTOM PROCEDURE TRAY) ×3 IMPLANT
PAD ARMBOARD 7.5X6 YLW CONV (MISCELLANEOUS) ×3 IMPLANT
PENCIL BUTTON HOLSTER BLD 10FT (ELECTRODE) ×3 IMPLANT
SET HNDPC FAN SPRY TIP SCT (DISPOSABLE) ×1 IMPLANT
SPONGE LAP 18X18 X RAY DECT (DISPOSABLE) ×9 IMPLANT
SUT SILK 2 0 TIES 10X30 (SUTURE) ×3 IMPLANT
SUT SILK 3 0 SH CR/8 (SUTURE) ×3 IMPLANT
SWAB COLLECTION DEVICE MRSA (MISCELLANEOUS) ×3 IMPLANT
TOWEL OR 17X24 6PK STRL BLUE (TOWEL DISPOSABLE) ×3 IMPLANT
TOWEL OR 17X26 10 PK STRL BLUE (TOWEL DISPOSABLE) ×3 IMPLANT
TUBE ANAEROBIC SPECIMEN COL (MISCELLANEOUS) ×3 IMPLANT
TUBE CONNECTING 12'X1/4 (SUCTIONS) ×2
TUBE CONNECTING 12X1/4 (SUCTIONS) ×4 IMPLANT
YANKAUER SUCT BULB TIP NO VENT (SUCTIONS) ×6 IMPLANT

## 2015-06-22 NOTE — Progress Notes (Signed)
Pharmacy Antibiotic Note  Michele MowersSharon Weber is a 45 y.o. female transferred from [facility] [facility]2015 with LLE cellulitis. Pt on Vancomycin, Zosyn and Clindamycin (Day #3). Pharmacy asked to continue to help dose abx upon transfer.  Pt with SCr of 3.77 (trending down). Recent admission [facility] with acute renal failure, does not appear to be on HD. Looking back in chart pt had normal SCr in 09/2013. Estimated normalized SCr 20-25 ml/min.  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 Pharmacist discussed with MD at [facility]. Recommend obtaining ID input in regards to antibiotics due to recent history of MDR organism.   5/4 Random Vanc level = 20 mcg/mL (~24 hours post first 2gm dose)  Plan: Change Zosyn to 3.375gm IV q8h - each dose over 4 hours Will check another random vanc level ~24 hrs past first level to see how much vancomycin is being cleared and then dose based on this level Continue Clindamycin 900mg  IV q8h Will f/u micro data, renal function, and pt's clinical condition     Temp (24hrs), Avg:97.5 F (36.4 C), Min:96.9 F (36.1 C), Max:97.7 F (36.5 C)   Recent Labs Lab 06/20/15 1711 06/21/15 0601 06/21/15 1815  WBC 7.3 6.1  --   CREATININE 3.88* 3.77*  --   LATICACIDVEN 2.0  --   --   VANCORANDOM  --   --  20    Estimated Creatinine Clearance: 38 mL/min (by C-G formula based on Cr of 3.77).    No Known Allergies  Antimicrobials this admission: 5/3 Zosyn >> 5/3 Vancomycin>> 5/3 Clinda>>  Dose adjustments this admission: n/a  Microbiology results: Cultures from [place]: 5/3 Blood x 2 - ngtd 5/3 MRSA PCR negative 5/3 Wound - heave growth GNR  Thank you for allowing pharmacy to be a part of this patient's care.  Christoper Fabianaron Jearld Hemp, PharmD, BCPS Clinical pharmacist, pager [phone] 06/22/2015 1:44 AM

## 2015-06-22 NOTE — Progress Notes (Signed)
[facility] Physician-Brief Progress Note Patient Name: Michele MowersSharon Weber DOB: 1970/12/26 MRN: [id number]   Date of Service  06/22/2015  HPI/Events of Note  ETT near the carina on CXR.  eICU Interventions  Pull ETT back 2 cm and re-secure.     Intervention Category Intermediate Interventions: Diagnostic test evaluation  [name] 06/22/2015, 9:05 PM

## 2015-06-22 NOTE — H&P (Signed)
PULMONARY / CRITICAL CARE MEDICINE   Name: Michele Weber MRN: [id number] DOB: [birth year]    ADMISSION DATE:  06/22/2015 CONSULTATION DATE:  06/22/15  REFERRING MD:  TRH at [facility]  CHIEF COMPLAINT:  Cellulitis LLE  HISTORY OF PRESENT ILLNESS:  Michele Weber is a 45 y.o. female with PMH as outlined below including super morbid obesity.  She had recent admission to [facility] for gram negative bacteremia (acinetobacter) and was discharged to rehab facility.  She then was admitted to [facility] on 06/20/15 for cellulitis of the left lower extremity / non-healing wounds.  At [facility] she was evaluated by surgery who recommended surgical debridement; however, anesthesia services at [facility] felt that she would be better served at tertiary care center. She was subsequently transferred to [facility] for further evaluation and management including probable surgical I&D.  PAST MEDICAL HISTORY :  She  has a past medical history of Morbid obesity (HCC); Lymphedema of both lower extremities; Diastolic CHF (HCC); Vitamin D deficiency; Atrial fibrillation (HCC); Vitamin D deficiency; Sepsis (HCC) (05/2015); Anemia; and Cellulitis and abscess.  PAST SURGICAL HISTORY: She  has past surgical history that includes Central venous catheter insertion (05/31/2015) and Incision and drainage abscess (2015 ?).  No Known Allergies  No current facility-administered medications on file prior to encounter.   Current Outpatient Prescriptions on File Prior to Encounter  Medication Sig  . albuterol (PROVENTIL HFA;VENTOLIN HFA) 108 (90 Base) MCG/ACT inhaler Inhale 2 puffs into the lungs every 6 (six) hours as needed for wheezing or shortness of breath.  . Amino Acids-Protein Hydrolys (FEEDING SUPPLEMENT, PRO-STAT SUGAR FREE 64,) LIQD Take 30 mLs by mouth 3 (three) times daily with meals.  . cholecalciferol (VITAMIN D) 1000 units tablet Take 2,000 Units by mouth daily.  . fluticasone (FLONASE) 50 MCG/ACT nasal spray Place 1 spray into both nostrils  daily as needed for allergies or rhinitis.  Marland Kitchen guaiFENesin (MUCINEX) 600 MG 12 hr tablet Take 600 mg by mouth 2 (two) times daily.  Marland Kitchen guaifenesin (ROBITUSSIN) 100 MG/5ML syrup Take 200 mg by mouth every 6 (six) hours as needed for cough.  . Hydrocodone-Acetaminophen 5-300 MG TABS Take 1 tablet by mouth every 6 (six) hours as needed (for pain).  Marland Kitchen ipratropium-albuterol (DUONEB) 0.5-2.5 (3) MG/3ML SOLN Take 3 mLs by nebulization every 6 (six) hours as needed (for shortness of breath).   Marland Kitchen levothyroxine (SYNTHROID, LEVOTHROID) 50 MCG tablet Take 50 mcg by mouth daily before breakfast.  . pantoprazole (PROTONIX) 40 MG tablet Take 1 tablet (40 mg total) by mouth daily.  . potassium chloride SA (K-DUR,KLOR-CON) 20 MEQ tablet Take 20 mEq by mouth daily.  Marland Kitchen saccharomyces boulardii (FLORASTOR) 250 MG capsule Take 250 mg by mouth 2 (two) times daily.  . silver sulfADIAZINE (SILVADENE) 1 % cream Apply 1 application topically 2 (two) times daily.  . simethicone (MYLICON) 80 MG chewable tablet Chew 80 mg by mouth every 6 (six) hours as needed (for indigestion).   . Skin Protectants, Misc. (EUCERIN) cream Apply 1 application topically daily.  Marland Kitchen topiramate (TOPAMAX) 50 MG tablet Take 50 mg by mouth 2 (two) times daily.    FAMILY HISTORY:  Her has no family status information on file.   SOCIAL HISTORY: She  reports that she has never smoked. She has never used smokeless tobacco. She reports that she does not drink alcohol or use illicit drugs.  REVIEW OF SYSTEMS:   All negative; except for those that are bolded, which indicate positives.  Constitutional: weight loss, weight gain, night  sweats, fevers, chills, fatigue, weakness.  HEENT: headaches, sore throat, sneezing, nasal congestion, post nasal drip, difficulty swallowing, tooth/dental problems, visual complaints, visual changes, ear aches. Neuro: difficulty with speech, weakness, numbness, ataxia. CV:  chest pain, orthopnea, PND, swelling in lower  extremities, dizziness, palpitations, syncope.  Resp: cough, hemoptysis, dyspnea, wheezing. GI  heartburn, indigestion, abdominal pain, nausea, vomiting, diarrhea, constipation, change in bowel habits, loss of appetite, hematemesis, melena, hematochezia.  GU: dysuria, change in color of urine, urgency or frequency, flank pain, hematuria. MSK: joint pain or swelling, decreased range of motion. Psych: change in mood or affect, depression, anxiety, suicidal ideations, homicidal ideations. Skin: rash, itching, bruising, wounds on LE that have "suddenly opened up".   SUBJECTIVE:  Pt states that the wounds on her legs suddenly opened 2 - 3 days ago.  States she has multiple wounds on her back and buttocks as well but that "last time I was admitted, they said that they were not infected".  VITAL SIGNS: BP 85/59 mmHg  Pulse 92  Resp 14  SpO2 97%  LMP 05/28/2015  HEMODYNAMICS:    VENTILATOR SETTINGS:    INTAKE / OUTPUT:     PHYSICAL EXAMINATION: General: Super morbid obese female, in NAD. Neuro: A&O x 3, non-focal.  HEENT: [place]/AT. PERRL, sclerae anicteric. Cardiovascular: Heart tones distant.  RRR, no M/R/G appreciated with respect to body habitus. Lungs: BS distant.  Respirations even and unlabored.  CTA bilaterally, No W/R/R appreciated with respect to body habitus. Abdomen: Super morbidly obese.  BS not appreciable. Musculoskeletal: Massive lymphedema. Skin: Extensive wound to LLE that is very malodorous and discolored.  Smaller wound to RLE.  Reportedly multiple back and buttock wounds.  LABS:  BMET  Recent Labs Lab 06/20/15 1711 06/21/15 0601  NA 133* 133*  K 4.1 4.1  CL 96* 101  CO2 23 20*  BUN 91* 92*  CREATININE 3.88* 3.77*  GLUCOSE 107* 98    Electrolytes  Recent Labs Lab 06/20/15 1711 06/21/15 0601  CALCIUM 8.7* 8.0*    CBC  Recent Labs Lab 06/20/15 1711 06/21/15 0601  WBC 7.3 6.1  HGB 11.6* 10.7*  HCT 35.8 33.3*  PLT 424 366     Coag's  Recent Labs Lab 06/20/15 2036 06/21/15 0601  INR 2.15 2.46    Sepsis Markers  Recent Labs Lab 06/20/15 1711  LATICACIDVEN 2.0    ABG No results for input(s): PHART, PCO2ART, PO2ART in the last 168 hours.  Liver Enzymes  Recent Labs Lab 06/20/15 1711  AST 15  ALT 15  ALKPHOS 69  BILITOT 0.6  ALBUMIN 2.9*    Cardiac Enzymes No results for input(s): TROPONINI, PROBNP in the last 168 hours.  Glucose  Recent Labs Lab 06/20/15 2233 06/21/15 0735 06/21/15 1119 06/21/15 1615 06/21/15 2102 06/22/15 0103  GLUCAP 107* 78 94 94 109* 124*    Imaging Dg Chest 1 View  06/21/2015  CLINICAL DATA:  Followup for PICC placement. EXAM: CHEST 1 VIEW COMPARISON:  None. FINDINGS: Central catheter extends from the right neck to have its tip lie at the caval atrial junction, well positioned. Heart is normal size. Normal mediastinal and hilar contours. Clear lungs. No pneumothorax. IMPRESSION: PICC tip projects at the caval atrial junction, well positioned. No acute cardiopulmonary disease. Electronically Signed   By: Amie Portlandavid  Ormond M.D.   On: 06/21/2015 19:31     STUDIES:  None.  CULTURES: Blood 05/05 > Wound 05/05 >  ANTIBIOTICS: Vanc 05/05 > Zosyn 05/05 > Clindamycin 05/05 >  SIGNIFICANT  EVENTS: 05/05 > admitted with presumed necrotizing fasciitis of LLE  LINES/TUBES: None  DISCUSSION: 45 y.o. super morbidly obese F admitted 05/05 with presumed nec fasc of LLE.  Was transferred from [facility] for surgical I&D.  ASSESSMENT / PLAN:  INFECTIOUS A:   Shock - presumed septic due to LLE necrotizing fasciitis.  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 P:   Abx as above (Vanc / Zosyn / Clindamycin).  Follow cultures as above. Day team to please consult ID for assistance with abx management.  PULMONARY A: OSA / OHS - on nocturnal BiPAP. P:   Continue nocturnal BiPAP.  CARDIOVASCULAR A:  Shock - presumed  septic due to LLE necrotizing fasciitis. Hx A.fib, dCHF. P:  Continue levophed as needed to maintain MAP > 65. Repeat lactate.  RENAL A:   AoCKD. Pseudohypocalcemia - corrects to 9.8. P:   NS @ 150. Assess ionized calcium. BMP in AM.  GASTROINTESTINAL A:   Super morbid obesity. GERD. Nutrition. P:   Pantoprazole. NPO.  HEMATOLOGIC A:   VTE Prophylaxis. P:  SCD's / Heparin. CBC in AM.  ENDOCRINE A:   Hypothyroidism. P:   Continue outpatient synthroid, change to IV formulation. Assess TSH.  NEUROLOGIC A:   No acute issues. P:   No interventions required.   Family updated: None.  Interdisciplinary Family Meeting v Palliative Care Meeting:  Due by: 06/28/15.  CC time: 35 minutes.   Rutherford Guys, [place] - [facility] [place] [facility] Pager: [phone]  or [phone] 06/22/2015, 2:12 AM

## 2015-06-22 NOTE — Progress Notes (Signed)
Anesthesiology Note:  As noted Michele Weber is a 45 year old female with severe morbid obesity and necrotizing wounds involving her left lower extremity. She was [facility] today where she was noted to be hypotensive and was started on levophed for blood pressure support.  She was brought to the operating room for wound debridement and was noted pre-operatively to be alert and oriented with no obvious respiratory distress. She was intubated using a glide scope with a rapid sequence technique. It was noted following intubation that she had suffered a right mucosal tear in the tonsillar and soft palate region. There was no active bleeding noted but there was superficial tissue injury. The vocal cords were well-visualized with the glide scope.   ABG approximately 15 min following intubation was as follows:  Ph 7.35 PCO2 39.PO2 561 BE -4  The patient tolerated the procedure well but required levophed at 20 mcg/min to maintain systolic BP > 100.  The decision was made to leave the patient intubated and take her to 48M as she will likely require further procedures, she needs vasopressor support, and the case is late in the day with less staff available if she does not tolerate extubation.  This plan was discussed with the CCM staff.  Michele Weber

## 2015-06-22 NOTE — Consult Note (Addendum)
Reason for Consult:draning leg wounds Referring Physician: Dr. Delight Stare Michele Weber is an 45 y.o. female.  HPI: 45 y/o F who was transferred from OSH for eval of LLE wound.  Pt was admitted there after two h/o LLE drainage from facility.  Attempts were made to take pt to OR, however due to pt's comorbidities surgery was canceled and pt sent to [facility] for higher level of care.    Past Medical History  Diagnosis Date  . Morbid obesity (HCC)   . Lymphedema of both lower extremities   . Diastolic CHF (HCC)   . Vitamin D deficiency   . Atrial fibrillation (HCC)   . Vitamin D deficiency   . Sepsis (HCC) 05/2015  . Anemia     unspecified  . Cellulitis and abscess     bilateral legs    Past Surgical History  Procedure Laterality Date  . Central venous catheter insertion  05/31/2015  . Incision and drainage abscess  2015 ?    left thigh    Family History  Problem Relation Age of Onset  . Diabetes Father     Social History:  reports that she has never smoked. She has never used smokeless tobacco. She reports that she does not drink alcohol or use illicit drugs.  Allergies: No Known Allergies  Medications: I have reviewed the patient's current medications.  Results for orders placed or performed during the hospital encounter of 06/22/15 (from the past 48 hour(s))  Glucose, capillary     Status: Abnormal   Collection Time: 06/22/15  1:03 AM  Result Value Ref Range   Glucose-Capillary 124 (H) 65 - 99 mg/dL    Dg Chest 1 View  02/22/1094  CLINICAL DATA:  Followup for PICC placement. EXAM: CHEST 1 VIEW COMPARISON:  None. FINDINGS: Central catheter extends from the right neck to have its tip lie at the caval atrial junction, well positioned. Heart is normal size. Normal mediastinal and hilar contours. Clear lungs. No pneumothorax. IMPRESSION: PICC tip projects at the caval atrial junction, well positioned. No acute cardiopulmonary disease. Electronically Signed   By: Amie Portland M.D.    On: 06/21/2015 19:31    Review of Systems  Constitutional: Negative.   HENT: Negative.   Respiratory: Negative.   Cardiovascular: Negative.   Gastrointestinal: Negative.   Skin:       Draining wounds    Blood pressure 91/64, pulse 99, resp. rate 14, height 5' (1.524 m), weight 274.88 kg (606 lb), last menstrual period 05/28/2015, SpO2 99 %. Physical Exam  Constitutional: She is oriented to person, place, and time.  Super morbid obesity  HENT:  Head: Normocephalic and atraumatic.  Eyes: Conjunctivae and EOM are normal. Pupils are equal, round, and reactive to light.  Neck: Normal range of motion. Neck supple.  Cardiovascular: An irregularly irregular rhythm present.  Respiratory: Effort normal and breath sounds normal.  GI: Soft. There is no tenderness. There is no rebound.  Obese   Neurological: She is alert and oriented to person, place, and time.  Skin:     Large area of foul smelling devitalized tissue, draining purulence    Assessment/Plan: 45 y/o F with super morbid obesity with what appears to be likely draining, necrotic LLE soft tissue infection. Active Problems:   Necrotizing fasciitis (HCC)   OSA (obstructive sleep apnea)   Obesity hypoventilation syndrome (HCC)   Chronic kidney disease (CKD), stage IV (severe) (HCC)   1. Will transfuse FFP To reverse INR, pt on coumadin for  Afib 2. Consent for D&I of LLE extremity.  Pt will likely require mult OR visits to debride wounds effectively. 3. con't with resuscitation   Marigene EhlersRamirez Jr., Jed LimerickArmando 06/22/2015, 3:53 AM

## 2015-06-22 NOTE — Progress Notes (Signed)
Pharmacy Antibiotic Note  Michele Weber is a 45 y.o. female transferred from [facility] on 06/22/2015 with LLE cellulitis and non-healing wounds. The patient was originally started on Vancomycin + Zosyn + Clindamycin. 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 Wound Cultures from [facility] on 5/3 are showing heavy growth of GNR. ID has been consulted and plans are to transition Zosyn to Amikacin + Unasyn and continue with Vancomycin + Clindamycin for now.   The patient is noted to have poor renal function but is improved since last admission. SCr 3.64, CrCl~20 ml/min (normalized). Given the patient's poor renal function - will use traditional dosing for Amikacin with guidance from a dosing protocol utilized at [facility].  Plan: 1. Amikacin 1050 mg IV x 1 followed by 1000 mg IV every 48 hours 2. Start Unasyn 3g IV every 8 hours 3. Continue Clindamycin 900 mg IV every 8 hours 4. Obtaining a Vancomycin random level this evening - further doses to be addressed at that time. 5. Will continue to follow renal function, culture results, LOT, and antibiotic de-escalation plans   Height: 5' (152.4 cm) Weight: (!) 606 lb (274.88 kg) IBW/kg (Calculated) : 45.5  Adjusted BW: 137 kg  Temp (24hrs), Avg:97.7 F (36.5 C), Min:96.9 F (36.1 C), Max:98.2 F (36.8 C)   Recent Labs Lab 06/20/15 1711 06/21/15 0601 06/21/15 1815 06/22/15 0448 06/22/15 0507  WBC 7.3 6.1  --   --  10.5  CREATININE 3.88* 3.77*  --   --  3.64*  LATICACIDVEN 2.0  --   --  1.4  --   VANCORANDOM  --   --  20  --   --     Estimated Creatinine Clearance: 42.3 mL/min (by C-G formula based on Cr of 3.64).    No Known Allergies  Antimicrobials this admission: Zosyn 5/3 >> 5/5 Vanc 5/3 >> Clinda 5/3 >> Ceftazidime 5/5 >> 5/5 Amikacin 5/5 >> Unasyn 5/5 >>  Dose adjustments this admission: n/a  Microbiology results: Cultures from last admit:   4/5 WCx + BCx (old cx) >> MDR Acinetabacter (pan-R except I-Tobra/Zosyn/CTX, S-Unasyn/Ceftaz/Imi) 4/9 BCx (old cx) >> MDR Acinetobacter (pan-R except I-Ceftaz/Unasyn) * Also noted to be R-Avycaz per discussion with ID (Cassie)  Cultures from [place]: 5/3 Blood x 2 - ngtd 5/3 MRSA PCR negative 5/3 WCx >> heavy growth GNR  This admit:  5/5 WCx >> 5/5 BCx >>  Thank you for allowing pharmacy to be a part of this patient's care.  Michele PillionElizabeth Diona Weber, PharmD, BCPS Clinical Pharmacist Pager: [phone] 06/22/2015 8:26 AM

## 2015-06-22 NOTE — Progress Notes (Signed)
eLink Physician-Brief Progress Note Patient Name: Michele Weber DOB: 1970/06/03 MRN: [id number]   Date of Service  06/22/2015  HPI/Events of Note  Patient returns for OR intubated and ventilated. Difficult airway - intubation  complicated by sub tonsiler mucosal tear.   eICU Interventions  Will order: 1. Ventilator - 50%/PRVC 15/TV 550/P 5. 2. ABG at 8 PM. 3. Portable CXR now. 4. Precedex IV infusion. Titrate to RASS 0 to -1.      Intervention Category Major Interventions: Respiratory failure - evaluation and management  Sommer,Steven Eugene 06/22/2015, 7:03 PM

## 2015-06-22 NOTE — Progress Notes (Signed)
PCCM Interval Note  Re-rounded on patient this pm.   She is comfortable on BiPAP, awake and interacting BP is at goal on moderately high dose norepi 20 Some improvement in S SCr with improved hemodynamics.  Coagulopathy noted, suspect due to sepsis (pt not on warfarin)  Reviewed C[facility] notes and plans. She is high risk for surgical debridement of her thigh, but she will likely not stabilize without the procedure. They have decided to take her to the OR 5/5 once she has received FFP to correct her coagulopathy. Will insure that Vit K given as well. Continue ceftaz + clinda + vanco, discussed with Dr Ninetta LightsHatcher with ID.   I suspect she will come back from the OR intubated. We will check on her again then.    Levy Pupaobert Byrum, MD, PhD 06/22/2015, 12:25 PM [facility] [phone] or if no answer [phone]

## 2015-06-22 NOTE — Op Note (Addendum)
Pre-op Diagnosis:  Necrotizing soft tissue infection - left lower extremity Post-op Diagnosis:  Same Procedure:  Sharp incision/ debridement/ irrigation of skin, subcutaneous fat, muscle - left lower extremity Total area excised 856 cm2 Left thigh - 24 x 34 cm - depth 10 cm  Left calf - 8 x 5 cm - depth 4 cm Surgeon:  Manus Rudd K. Asst. Dr. Madelin Rear Anesthesia:  Gen. endotracheal Indications:  This is a 45 year old female who is super morbidly obese with a BMI greater than 119 who presented in transfer from [place] with long-standing chronic left leg wounds. They deferred surgery at [facility] because of anesthesia concerns about her size.  She is septic and on vasopressors due to this necrotizing soft tissue infection. She is coagulopathic and has required multiple transfusions of fresh frozen plasma which has further delayed her procedure. She presents now to the operating room for debridement.  Description of procedure: The patient is brought to the operating room on her bariatric stretcher. We used 3 operating tables that were placed side-by-side and secured together. The pads on top of the operating table were  place transversely. She was transferred from her bed to the operating table using the hovermat device.  After an adequate level of general anesthesia  was obtained, we examined her left lower extremity. The medial part of her thigh as well as her calf show a large amount of necrotic skin and subcutaneous tissues tissue. Laterally on her left calf there is an area of firm eschar with some underlying fluctuance. The dorsum of her foot shows some necrotic skin and soft tissue. Medially on her left ankle there is a very superficial area of necrosis as well. With considerable difficulty we prepped her entire leg with Betadine and draped in sterile fashion. A timeout was taken to ensure the proper patient and proper procedure. We began by debriding the dorsum of her foot and the medial ankle.  We took this down to healthy-appearing tissue. Cautery was used for hemostasis. Both these wounds were irrigated. We debrided the lateral left calf lesion by excising the eschar. There is a considerable amount of necrotic tissue that goes down about 4 cm deep measuring 5 x 8 cm. We excised the necrotic tissue and packed the wound with saline moistened gauze after using cautery for hemostasis. Cautery and scalpel were used for debridement.  We then turned our attention to the medial part of her leg. She has massive areas of necrotic tissue. We used a combination of knife, cautery, and LigaSure to excise the necrotic skin and subcutaneous fat. We debrided both the thigh wound as well as the lower calf wound. The lower calf wound appears healthy with some yellow. Underlying fat. The upper wound has a lot of thin drainage. We excised what appeared to be some necrotic sartorius muscle. We debrided back to viable fatty tissue. There did not seem to be any further underlying pockets of purulence or necrotic tissue. We irrigated all the wounds with the pulse lavage device. The wounds were dressed with multiple rolls of saline moistened Kerlix except for the dorsum of the foot and the medial ankle. These were dressed with Xeroform gauze. A BPD dressings and tape were used to cover all of the wounds. This was considerably difficult and required about 5 people to perform these dressings. The patient was then transported back to the intensive care unit while still intubated. All sponge, instrument, and needle counts are correct.  She may need further debridement in the OR  next week.     Wilmon ArmsMatthew K. Corliss Skainssuei, MD, [facility]  General/ Trauma Surgery  06/22/2015 6:32 PM

## 2015-06-22 NOTE — Care Management Note (Addendum)
Case Management Note  Patient Details  Name: Marcie MowersSharon Chumley MRN: [id number] Date of Birth: 1970-04-20  Subjective/Objective:    Pt admitted with necrotizing fascititis                Action/Plan:  Pt is a [facility].  Prior to admit pt [facility] - however [facility] and [facility] were actively working pt transfer prior to [facility] admit due to intensity with wound care.  [facility] made aware of pts admit and will follow pt.    CSW consulted   Expected Discharge Date:                  Expected Discharge Plan:  Skilled Nursing Facility  In-House Referral:  Clinical Social Work  Discharge planning Services  CM Consult  Post Acute Care Choice:    Choice offered to:     DME Arranged:    DME Agency:     HH Arranged:    HH Agency:     Status of Service:  In process, will continue to follow  Medicare Important Message Given:    Date Medicare IM Given:    Medicare IM give by:    Date Additional Medicare IM Given:    Additional Medicare Important Message give by:     If discussed at Long Length of Stay Meetings, dates discussed:    Additional Comments:  Cherylann ParrClaxton, Prim Morace S, RN 06/22/2015, 8:35 AM

## 2015-06-22 NOTE — Progress Notes (Signed)
Pharmacy Antibiotic Note  Michele MowersSharon Bannister is a 45 y.o. morbidly obese female admitted on 06/22/2015 with LLE cellulitis and non-healing wounds as well as recent Acinetobacter bacteremia.  Pharmacy has been consulted for vancomycin, Amikacin, Unasyn, and Clindamycin dosing.  Vancomycin level is 13 - 48 hours after 2g dose.  Poor UOP and SCr 3.64. Calculated CrCl not accurate for this patient.   Plan: Vancomycin 2500 mg IV every 48 hours for expected trough ~16. Will schedule when patient out of OR.  Monitor UOP closely to determine if further adjustment needed.   Height: 5' (152.4 cm) Weight: (!) 606 lb (274.88 kg) IBW/kg (Calculated) : 45.5  Temp (24hrs), Avg:97.9 F (36.6 C), Min:97.3 F (36.3 C), Max:98.9 F (37.2 C)   Recent Labs Lab 06/20/15 1711 06/21/15 0601 06/21/15 1815 06/22/15 0448 06/22/15 0507 06/22/15 1521  WBC 7.3 6.1  --   --  10.5  --   CREATININE 3.88* 3.77*  --   --  3.64*  --   LATICACIDVEN 2.0  --   --  1.4  --   --   VANCORANDOM  --   --  20  --   --  13    Estimated Creatinine Clearance: 42.3 mL/min (by C-G formula based on Cr of 3.64).    No Known Allergies  Antimicrobials this admission: Zosyn 5/3 >> 5/5 Vancomycin 5/3 >> Clinda 5/3 >> Amikacin 5/5 >> Unasyn 5/5 >>  Dose adjustments this admission: 5/4 Random Vanc level = 20 mcg/mL (~24 hours post first 2gm dose) 5/5 Random Vanc level = 13 mcg/mL (~48 hours post first 2gm dose)  Microbiology results: Cultures from last admit:  4/5 WCx + BCx (old cx) >> MDR Acinetabacter (pan-R except I-Tobra/Zosyn/CTX, S-Unasyn/Ceftaz/Imi)  4/9 BCx (old cx) >> MDR Acinetobacter (pan-R except I-Ceftaz/Unasyn)  * Also noted to be R-Avycaz per discussion with ID (Cassie)   Cultures from [facility]:  5/3 Blood x 2 - ngtd  5/3 MRSA PCR negative  5/3 WCx >> heavy growth GNR   This admit:  5/5 WCx >>  5/5 BCx >>  Thank you for allowing pharmacy to be a part of this patient's care.  Link SnufferJessica Londyn Hotard, PharmD,  BCPS Clinical Pharmacist [phone] 06/22/2015 5:01 PM

## 2015-06-22 NOTE — Progress Notes (Addendum)
[facility] is currently following Patient as possible LTACH candidate. Patient is agreeable to return [facility] when she is medically stable and cleared for discharge if she does not qualify for LTACH. CSW will continue to follow for disposition.      Lance MussAshley Gardner,MSW, LCSW [facility] ED/44M Clinical Social Worker [phone]

## 2015-06-22 NOTE — Anesthesia Procedure Notes (Signed)
Procedure Name: Intubation Performed by: Karie SchwalbeJUDD, Callan Norden Pre-anesthesia Checklist: Patient identified, Emergency Drugs available, Suction available and Patient being monitored Patient Re-evaluated:Patient Re-evaluated prior to inductionOxygen Delivery Method: Circle System Utilized Preoxygenation: Pre-oxygenation with 100% oxygen Intubation Type: IV induction Laryngoscope Size: Glidescope and 4 Grade View: Grade II Tube type: Oral Tube size: 8.0 mm Number of attempts: 1 Airway Equipment and Method: Stylet,  Oral airway and Bougie stylet Placement Confirmation: ETT inserted through vocal cords under direct vision,  positive ETCO2 and breath sounds checked- equal and bilateral Secured at: 23 cm Tube secured with: Tape Dental Injury: Bloody posterior oropharynx  Difficulty Due To: Difficulty was anticipated Future Recommendations: Recommend- induction with short-acting agent, and alternative techniques readily available Comments: Preoxygenated the patient and using blankets as a ramp had the patient in a sniffing position. Induction with propofol and succinylcholine. Very large tongue swept away. Mouth very crowded. ETT able to be passed to vocal cords but did not want to advance through. Stylet removed and then bougie passed through ETT. ETT then able to be advanced. When removing glidescope, noticed bleeding on oral pharynx and a possible injury to the palate. Difficult to fully assess injury. Suctioned oral pharynx and bleeding resolved. Appears to be a tear. Will reevaluate prior to removing ETT.

## 2015-06-22 NOTE — Anesthesia Preprocedure Evaluation (Signed)
Anesthesia Evaluation  Patient identified by MRN, date of birth, ID band Patient awake    Reviewed: Allergy & Precautions, NPO status , Patient's Chart, lab work & pertinent test results  History of Anesthesia Complications Negative for: history of anesthetic complications  Airway Mallampati: III  TM Distance: >3 FB Neck ROM: Full    Dental no notable dental hx. (+) Dental Advisory Given, Chipped,    Pulmonary sleep apnea ,    Pulmonary exam normal breath sounds clear to auscultation       Cardiovascular +CHF  Normal cardiovascular exam+ dysrhythmias Atrial Fibrillation  Rhythm:Regular Rate:Normal     Neuro/Psych negative neurological ROS  negative psych ROS   GI/Hepatic negative GI ROS, Neg liver ROS,   Endo/Other  Hypothyroidism Morbid obesitySuper morbidly obese  Renal/GU negative Renal ROS  negative genitourinary   Musculoskeletal negative musculoskeletal ROS (+)   Abdominal   Peds negative pediatric ROS (+)  Hematology negative hematology ROS (+)   Anesthesia Other Findings   Reproductive/Obstetrics negative OB ROS                             Anesthesia Physical Anesthesia Plan  ASA: IV  Anesthesia Plan: General   Post-op Pain Management:    Induction: Intravenous  Airway Management Planned: Oral ETT and Video Laryngoscope Planned  Additional Equipment:   Intra-op Plan:   Post-operative Plan: Extubation in OR and Possible Post-op intubation/ventilation  Informed Consent: I have reviewed the patients History and Physical, chart, labs and discussed the procedure including the risks, benefits and alternatives for the proposed anesthesia with the patient or authorized representative who has indicated his/her understanding and acceptance.   Dental advisory given  Plan Discussed with: CRNA  Anesthesia Plan Comments: (Discussed with patient the possibility of  postoperative ventilation in the ICU. She is aware and understanding of this plan. )        Anesthesia Quick Evaluation

## 2015-06-22 NOTE — Progress Notes (Signed)
This note also relates to the following rows which could not be included: SpO2 - Cannot attach notes to unvalidated device data   Tube withdrawn 2 Cm per MD order

## 2015-06-22 NOTE — Consult Note (Addendum)
[place] for Infectious Disease  Date of Admission:  06/22/2015  Date of Consult:  06/22/2015  Reason for Consult: Necrotizing Fascitis Referring Physician: Levie Heritage  Impression/Recommendation LLE Necrotizing Fascitis Hx of Acinetobacter bacteremia (05-2015) Morbid Obesity CRI  Would Send op Cx (d/i surgery) Change her atbx to amikacin, unasyn, vanco, clinda Her Cr is improved from last month however will need to watch closely with aminoglycoside.   Coagulopathy being corrected, going to OR sson.  Await her repeat Cx's Often acinetobacter are sensitive to sulbactam in unsayn.  Long term bariatric planning.   Thank you so much for this interesting consult,   Bobby Rumpf (pager) [phone] [email or web address]  Michele Weber is an 45 y.o. female.  HPI: 45 yo F with morbid obesity, previous acinetobacter (S- colistin, amikacin. R- avycaz) bacteremia and wound Cx 05-2015 (souce wound?, treated with imipenem). 2/3 were sensitive to unasyn. By the time her sensitivities had returned, she was improved, and was able to be d/c.  She was prev living at [facility].  She was adm to [facility] on 5-3 with cellulitis on her L thigh and worsening of her wounds. The wound worsened at her facility. She was started on vanco/zosyn. BCx ngtd, wound Cx heavy growth GNR.  She has hypotension requiring levofed, coagulopathy, and was felt to have necrotizing fascitis. She was transferred to [facility] t for surgical eval, CCM services. She was started on vanco/clinda/zosyn.  She has been seen by surgery and is planned for debridement.   Past Medical History  Diagnosis Date  . Morbid obesity ([place])   . Lymphedema of both lower extremities   . Diastolic CHF ([place])   . Vitamin D deficiency   . Atrial fibrillation ([place])   . Vitamin D deficiency   . Sepsis ([place]) 05/2015  . Anemia     unspecified  . Cellulitis and abscess     bilateral legs    Past Surgical History  Procedure Laterality Date    . Central venous catheter insertion  05/31/2015  . Incision and drainage abscess  2015 ?    left thigh     No Known Allergies  Medications:  Scheduled: . sodium chloride   Intravenous Once  . antiseptic oral rinse  7 mL Mouth Rinse q12n4p  . cefTAZidime (FORTAZ)  IV  2 g Intravenous Q12H  . chlorhexidine  15 mL Mouth Rinse BID  . clindamycin (CLEOCIN) IV  900 mg Intravenous Q8H  . heparin  5,000 Units Subcutaneous Q8H  . levothyroxine  25 mcg Intravenous Daily  . pantoprazole (PROTONIX) IV  40 mg Intravenous Q24H  . phytonadione (VITAMIN K) IV  10 mg Intravenous Once    Abtx:  Anti-infectives    Start     Dose/Rate Route Frequency Ordered Stop   06/22/15 0900  cefTAZidime (FORTAZ) 2 g in dextrose 5 % 50 mL IVPB     2 g 100 mL/hr over 30 Minutes Intravenous Every 12 hours 06/22/15 0826     06/22/15 0300  clindamycin (CLEOCIN) IVPB 900 mg     900 mg 100 mL/hr over 30 Minutes Intravenous Every 8 hours 06/22/15 0158     06/22/15 0200  piperacillin-tazobactam (ZOSYN) IVPB 3.375 g  Status:  Discontinued     3.375 g 12.5 mL/hr over 240 Minutes Intravenous Every 8 hours 06/22/15 0158 06/22/15 0756      Total days of antibiotics: 2 vanco/zosyn --> clinda/vanco/ceftaz          Social History:  reports that  she has never smoked. She has never used smokeless tobacco. She reports that she does not drink alcohol or use illicit drugs.  Family History  Problem Relation Age of Onset  . Diabetes Father     General ROS: normal BM, normal urine, no fever. + chills, see HPI.   Blood pressure 89/57, pulse 105, temperature 97.4 F (36.3 C), temperature source Oral, resp. rate 18, height 5' (1.524 m), weight 274.88 kg (606 lb), last menstrual period 05/28/2015, SpO2 99 %. General appearance: alert, cooperative, no distress and morbidly obese Eyes: negative findings: conjunctivae and sclerae normal and pupils equal, round, reactive to light and accomodation Throat: normal findings:  oropharynx pink & moist without lesions or evidence of thrush Neck: no adenopathy, supple, symmetrical, trachea midline and R IJ line.  Lungs: clear to auscultation bilaterally Heart: irregularly irregular rhythm Abdomen: normal findings: bowel sounds normal and soft, non-tender Extremities: edema anasarca Skin: large open, necrotic wound on LLE. foul smelling. tender.    Results for orders placed or performed during the hospital encounter of 06/22/15 (from the past 48 hour(s))  Glucose, capillary     Status: Abnormal   Collection Time: 06/22/15  1:03 AM  Result Value Ref Range   Glucose-Capillary 124 (H) 65 - 99 mg/dL  Type and screen [place]     Status: None   Collection Time: 06/22/15  4:21 AM  Result Value Ref Range   ABO/RH(D) B POS    Antibody Screen NEG    Sample Expiration 06/25/2015   Prepare fresh frozen plasma     Status: None (Preliminary result)   Collection Time: 06/22/15  4:22 AM  Result Value Ref Range   Unit Number [id number]    Blood Component Type THWPLS APHR1    Unit division 00    Status of Unit ISSUED    Transfusion Status OK TO TRANSFUSE    Unit Number [id number]    Blood Component Type THWPLS APHR2    Unit division 00    Status of Unit ISSUED    Transfusion Status OK TO TRANSFUSE   ABO/Rh     Status: None   Collection Time: 06/22/15  4:30 AM  Result Value Ref Range   ABO/RH(D) B POS   Lactic acid, plasma     Status: None   Collection Time: 06/22/15  4:48 AM  Result Value Ref Range   Lactic Acid, Venous 1.4 0.5 - 2.0 mmol/L  CBC     Status: Abnormal   Collection Time: 06/22/15  5:07 AM  Result Value Ref Range   WBC 10.5 4.0 - 10.5 K/uL   RBC 3.70 (L) 3.87 - 5.11 MIL/uL   Hemoglobin 11.3 (L) 12.0 - 15.0 g/dL   HCT 36.8 36.0 - 46.0 %   MCV 99.5 78.0 - 100.0 fL   MCH 30.5 26.0 - 34.0 pg   MCHC 30.7 30.0 - 36.0 g/dL   RDW 17.6 (H) 11.5 - 15.5 %   Platelets 413 (H) 150 - 400 K/uL  Basic metabolic panel     Status:  Abnormal   Collection Time: 06/22/15  5:07 AM  Result Value Ref Range   Sodium 138 135 - 145 mmol/L   Potassium 4.1 3.5 - 5.1 mmol/L   Chloride 103 101 - 111 mmol/L   CO2 19 (L) 22 - 32 mmol/L   Glucose, Bld 143 (H) 65 - 99 mg/dL   BUN 85 (H) 6 - 20 mg/dL   Creatinine, Ser 3.64 (H) 0.44 -  1.00 mg/dL   Calcium 8.0 (L) 8.9 - 10.3 mg/dL   GFR calc non Af Amer 14 (L) >60 mL/min   GFR calc Af Amer 16 (L) >60 mL/min    Comment: (NOTE) The eGFR has been calculated using the CKD EPI equation. This calculation has not been validated in all clinical situations. eGFR's persistently <60 mL/min signify possible Chronic Kidney Disease.    Anion gap 16 (H) 5 - 15  Magnesium     Status: Abnormal   Collection Time: 06/22/15  5:07 AM  Result Value Ref Range   Magnesium 1.6 (L) 1.7 - 2.4 mg/dL  Phosphorus     Status: Abnormal   Collection Time: 06/22/15  5:07 AM  Result Value Ref Range   Phosphorus 5.9 (H) 2.5 - 4.6 mg/dL  TSH     Status: Abnormal   Collection Time: 06/22/15  8:00 AM  Result Value Ref Range   TSH 8.854 (H) 0.350 - 4.500 uIU/mL  Protime-INR     Status: Abnormal   Collection Time: 06/22/15  9:15 AM  Result Value Ref Range   Prothrombin Time 33.2 (H) 11.6 - 15.2 seconds   INR 3.34 (H) 0.00 - 1.49  Prepare fresh frozen plasma     Status: None (Preliminary result)   Collection Time: 06/22/15 11:51 AM  Result Value Ref Range   Unit Number [id number]    Blood Component Type THAWED PLASMA    Unit division 00    Status of Unit ALLOCATED    Transfusion Status OK TO TRANSFUSE    Unit Number [id number]    Blood Component Type THAWED PLASMA    Unit division 00    Status of Unit ISSUED    Transfusion Status OK TO TRANSFUSE    Unit Number [id number]    Blood Component Type THAWED PLASMA    Unit division 00    Status of Unit ALLOCATED    Transfusion Status OK TO TRANSFUSE    Unit Number [id number]    Blood Component Type THAWED PLASMA    Unit division 00    Status  of Unit ISSUED    Transfusion Status OK TO TRANSFUSE       Component Value Date/Time   SDES BLOOD RIGHT ASSIST CONTROL 06/20/2015 1711   SDES BLOOD LEFT ASSIST CONTROL 06/20/2015 1711   SDES WOUND 06/20/2015 1711   SPECREQUEST BOTTLES DRAWN AEROBIC AND ANAEROBIC  5CC 06/20/2015 1711   SPECREQUEST BOTTLES DRAWN AEROBIC AND ANAEROBIC  5CC 06/20/2015 1711   SPECREQUEST Normal 06/20/2015 1711   CULT NO GROWTH 2 DAYS 06/20/2015 1711   CULT NO GROWTH 2 DAYS 06/20/2015 1711   CULT  06/20/2015 1711    HEAVY GROWTH GRAM NEGATIVE RODS IDENTIFICATION AND SUSCEPTIBILITIES TO FOLLOW    REPTSTATUS PENDING 06/20/2015 1711   REPTSTATUS PENDING 06/20/2015 1711   REPTSTATUS PENDING 06/20/2015 1711   Dg Chest 1 View  06/21/2015  CLINICAL DATA:  Followup for PICC placement. EXAM: CHEST 1 VIEW COMPARISON:  None. FINDINGS: Central catheter extends from the right neck to have its tip lie at the caval atrial junction, well positioned. Heart is normal size. Normal mediastinal and hilar contours. Clear lungs. No pneumothorax. IMPRESSION: PICC tip projects at the caval atrial junction, well positioned. No acute cardiopulmonary disease. Electronically Signed   By: Lajean Manes M.D.   On: 06/21/2015 19:31   Recent Results (from the past 240 hour(s))  Culture, blood (routine x 2)     Status: None (Preliminary result)   Collection  Time: 06/20/15  5:11 PM  Result Value Ref Range Status   Specimen Description BLOOD RIGHT ASSIST CONTROL  Final   Special Requests BOTTLES DRAWN AEROBIC AND ANAEROBIC  5CC  Final   Culture NO GROWTH 2 DAYS  Final   Report Status PENDING  Incomplete  Culture, blood (routine x 2)     Status: None (Preliminary result)   Collection Time: 06/20/15  5:11 PM  Result Value Ref Range Status   Specimen Description BLOOD LEFT ASSIST CONTROL  Final   Special Requests BOTTLES DRAWN AEROBIC AND ANAEROBIC  5CC  Final   Culture NO GROWTH 2 DAYS  Final   Report Status PENDING  Incomplete  Wound  culture     Status: None (Preliminary result)   Collection Time: 06/20/15  5:11 PM  Result Value Ref Range Status   Specimen Description WOUND  Final   Special Requests Normal  Final   Gram Stain PENDING  Incomplete   Culture   Final    HEAVY GROWTH GRAM NEGATIVE RODS IDENTIFICATION AND SUSCEPTIBILITIES TO FOLLOW    Report Status PENDING  Incomplete  Surgical PCR screen     Status: None   Collection Time: 06/20/15 10:10 PM  Result Value Ref Range Status   MRSA, PCR NEGATIVE NEGATIVE Final   Staphylococcus aureus NEGATIVE NEGATIVE Final    Comment:        The Xpert SA Assay (FDA approved for NASAL specimens in patients over 63 years of age), is one component of a comprehensive surveillance program.  Test performance has been validated by [facility] for patients greater than or equal to 26 year old. It is not intended to diagnose infection nor to guide or monitor treatment.       06/22/2015, 1:42 PM     LOS: 0 days    Records and images were personally reviewed where available.

## 2015-06-22 NOTE — Progress Notes (Signed)
Initial Nutrition Assessment  DOCUMENTATION CODES:   Morbid obesity  INTERVENTION:    Diet advancement as able per MD after surgery. Patient will need PO supplements to help meet nutrition needs.  NUTRITION DIAGNOSIS:   Increased nutrient needs related to wound healing as evidenced by estimated needs.  GOAL:   Patient will meet greater than or equal to 90% of their needs  MONITOR:   Diet advancement, Labs, Skin, I & O's  REASON FOR ASSESSMENT:   Low Braden    ASSESSMENT:   45 year old woman morbidly obese transferred [facility] where she was admitted on 4253 for cellulitis of her left thigh and nonhealing wounds-[facility] felt that [facility]. She was hypotensive and required pressors prior to transfer.  Labs reviewed: magnesium low, phosphorus elevated  Patient scheduled for I&D of wounds today. She is currently NPO, on BiPAP.   Diet Order:  Diet NPO time specified  Skin:  Wound (see comment) (Bil legs open weeping wounds/cellulitis; Left hip wound)  Last BM:  5/4  Height:   Ht Readings from Last 1 Encounters:  06/22/15 5' (1.524 m)    Weight:   Wt Readings from Last 1 Encounters:  06/22/15 606 lb (274.88 kg)    Ideal Body Weight:  45.5 kg  BMI:  Body mass index is 118.35 kg/(m^2).  Estimated Nutritional Needs:   Kcal:  2500  Protein:  150 gm  Fluid:  2.5 L  EDUCATION NEEDS:   No education needs identified at this time  Joaquin CourtsKimberly Harris, RD, LDN, CNSC Pager [phone] After Hours Pager [phone]

## 2015-06-22 NOTE — Progress Notes (Signed)
Patient ID: Marcie MowersSharon Welby, female   DOB: Jul 17, 1970, 45 y.o.   MRN: [id number]   BMI 119 I examined the patient and discussed the procedure with her.  This will be a very difficult and risky operation because of the difficulty in positioning as well as airway issues due to her size.  However, this is necessary because of her ongoing sepsis.  We will plan to use two operating tables side by side to safely support her girth.  We will attempt to debride the anterior and medial parts of her lower extremity in a supine position, then turn her lateral to debride the posterior thigh.  She will likely need serial debridements.  Dressing changes will be very difficult because of her size and the size of her wounds.  Wilmon ArmsMatthew K. Corliss Skainssuei, MD, [facility]  General/ Trauma Surgery  06/22/2015 8:59 AM

## 2015-06-22 NOTE — Transfer of Care (Signed)
Immediate Anesthesia Transfer of Care Note  Patient: Marcie MowersSharon Sulak  Procedure(s) Performed: Procedure(s): DEBRIDEMENT LEFT LEG INFECTION (Left)  Patient Location: ICU  Anesthesia Type:General  Level of Consciousness: Patient remains intubated per anesthesia plan  Airway & Oxygen Therapy: Patient remains intubated per anesthesia plan  Post-op Assessment: Report given to RN and Post -op Vital signs reviewed and stable  Post vital signs: Reviewed and stable  Last Vitals:  Filed Vitals:   06/22/15 1530 06/22/15 1545  BP: 115/49 107/54  Pulse: 107 106  Temp:    Resp: 20 18    Last Pain:  Filed Vitals:   06/22/15 1630  PainSc: 0-No pain         Complications: No apparent anesthesia complications

## 2015-06-23 ENCOUNTER — Inpatient Hospital Stay (HOSPITAL_COMMUNITY): Payer: Medicare Other

## 2015-06-23 DIAGNOSIS — Z1624 Resistance to multiple antibiotics: Secondary | ICD-10-CM

## 2015-06-23 DIAGNOSIS — Z9911 Dependence on respirator [ventilator] status: Secondary | ICD-10-CM

## 2015-06-23 DIAGNOSIS — N184 Chronic kidney disease, stage 4 (severe): Secondary | ICD-10-CM

## 2015-06-23 DIAGNOSIS — B9689 Other specified bacterial agents as the cause of diseases classified elsewhere: Secondary | ICD-10-CM

## 2015-06-23 DIAGNOSIS — M726 Necrotizing fasciitis: Secondary | ICD-10-CM

## 2015-06-23 DIAGNOSIS — Z978 Presence of other specified devices: Secondary | ICD-10-CM | POA: Insufficient documentation

## 2015-06-23 DIAGNOSIS — Z789 Other specified health status: Secondary | ICD-10-CM

## 2015-06-23 DIAGNOSIS — J9600 Acute respiratory failure, unspecified whether with hypoxia or hypercapnia: Secondary | ICD-10-CM

## 2015-06-23 LAB — CBC
HCT: 33 % — ABNORMAL LOW (ref 36.0–46.0)
HEMOGLOBIN: 10.2 g/dL — AB (ref 12.0–15.0)
MCH: 29.9 pg (ref 26.0–34.0)
MCHC: 30.9 g/dL (ref 30.0–36.0)
MCV: 96.8 fL (ref 78.0–100.0)
Platelets: 340 10*3/uL (ref 150–400)
RBC: 3.41 MIL/uL — AB (ref 3.87–5.11)
RDW: 17.6 % — ABNORMAL HIGH (ref 11.5–15.5)
WBC: 11.2 10*3/uL — AB (ref 4.0–10.5)

## 2015-06-23 LAB — PREPARE FRESH FROZEN PLASMA
UNIT DIVISION: 0
UNIT DIVISION: 0
UNIT DIVISION: 0
UNIT DIVISION: 0
Unit division: 0
Unit division: 0
Unit division: 0
Unit division: 0

## 2015-06-23 LAB — BASIC METABOLIC PANEL
ANION GAP: 15 (ref 5–15)
Anion gap: 11 (ref 5–15)
BUN: 71 mg/dL — AB (ref 6–20)
BUN: 59 mg/dL — AB (ref 6–20)
CHLORIDE: 108 mmol/L (ref 101–111)
CHLORIDE: 108 mmol/L (ref 101–111)
CO2: 17 mmol/L — ABNORMAL LOW (ref 22–32)
CO2: 20 mmol/L — AB (ref 22–32)
Calcium: 7.6 mg/dL — ABNORMAL LOW (ref 8.9–10.3)
Calcium: 7.3 mg/dL — ABNORMAL LOW (ref 8.9–10.3)
Creatinine, Ser: 2.91 mg/dL — ABNORMAL HIGH (ref 0.44–1.00)
Creatinine, Ser: 2.64 mg/dL — ABNORMAL HIGH (ref 0.44–1.00)
GFR calc Af Amer: 21 mL/min — ABNORMAL LOW (ref >60)
GFR calc Af Amer: 24 mL/min — ABNORMAL LOW (ref >60)
GFR calc non Af Amer: 18 mL/min — ABNORMAL LOW (ref >60)
GFR calc non Af Amer: 21 mL/min — ABNORMAL LOW (ref >60)
GLUCOSE: 134 mg/dL — AB (ref 65–99)
GLUCOSE: 290 mg/dL — AB (ref 65–99)
POTASSIUM: 4.4 mmol/L (ref 3.5–5.1)
POTASSIUM: 3.6 mmol/L (ref 3.5–5.1)
Sodium: 140 mmol/L (ref 135–145)
Sodium: 139 mmol/L (ref 135–145)

## 2015-06-23 LAB — BLOOD GAS, ARTERIAL
Acid-base deficit: 7 mmol/L — ABNORMAL HIGH (ref 0.0–2.0)
Bicarbonate: 18 mEq/L — ABNORMAL LOW (ref 20.0–24.0)
FIO2: 0.4
LHR: 16 {breaths}/min
MECHVT: 550 mL
O2 SAT: 99.4 %
PATIENT TEMPERATURE: 97.6
PCO2 ART: 35.7 mmHg (ref 35.0–45.0)
PEEP: 5 cmH2O
PH ART: 7.321 — AB (ref 7.350–7.450)
PO2 ART: 181 mmHg — AB (ref 80.0–100.0)
TCO2: 19.1 mmol/L (ref 0–100)

## 2015-06-23 LAB — GLUCOSE, CAPILLARY
GLUCOSE-CAPILLARY: 131 mg/dL — AB (ref 65–99)
Glucose-Capillary: 118 mg/dL — ABNORMAL HIGH (ref 65–99)
Glucose-Capillary: 157 mg/dL — ABNORMAL HIGH (ref 65–99)

## 2015-06-23 LAB — MAGNESIUM
MAGNESIUM: 1.6 mg/dL — AB (ref 1.7–2.4)
Magnesium: 1.4 mg/dL — ABNORMAL LOW (ref 1.7–2.4)

## 2015-06-23 LAB — CORTISOL: Cortisol, Plasma: 14.6 ug/dL

## 2015-06-23 LAB — PHOSPHORUS: Phosphorus: 4.8 mg/dL — ABNORMAL HIGH (ref 2.5–4.6)

## 2015-06-23 MED ORDER — SODIUM CHLORIDE 0.9 % IV SOLN
3.0000 g | Freq: Four times a day (QID) | INTRAVENOUS | Status: DC
  Administered 2015-06-23 – 2015-06-26 (×11): 3 g via INTRAVENOUS
  Filled 2015-06-23 (×14): qty 3

## 2015-06-23 MED ORDER — SODIUM CHLORIDE 0.9 % IV SOLN
200.0000 mg | INTRAVENOUS | Status: DC
  Filled 2015-06-23: qty 2.67

## 2015-06-23 MED ORDER — ANTISEPTIC ORAL RINSE SOLUTION (CORINZ)
7.0000 mL | Freq: Four times a day (QID) | OROMUCOSAL | Status: DC
  Administered 2015-06-23 – 2015-06-25 (×9): 7 mL via OROMUCOSAL

## 2015-06-23 MED ORDER — SODIUM CHLORIDE 0.45 % IV SOLN
INTRAVENOUS | Status: DC
  Administered 2015-06-23 (×2): via INTRAVENOUS

## 2015-06-23 MED ORDER — AMIODARONE HCL IN DEXTROSE 360-4.14 MG/200ML-% IV SOLN
60.0000 mg/h | INTRAVENOUS | Status: AC
  Administered 2015-06-23 – 2015-06-24 (×2): 60 mg/h via INTRAVENOUS
  Filled 2015-06-23: qty 200

## 2015-06-23 MED ORDER — SODIUM CHLORIDE 0.9 % IV SOLN
300.0000 mg | Freq: Once | INTRAVENOUS | Status: AC
  Administered 2015-06-23: 300 mg via INTRAVENOUS
  Filled 2015-06-23: qty 4

## 2015-06-23 MED ORDER — LINEZOLID 100 MG/5ML PO SUSR
600.0000 mg | Freq: Two times a day (BID) | ORAL | Status: DC
  Filled 2015-06-23: qty 30

## 2015-06-23 MED ORDER — SODIUM CHLORIDE 0.9 % IV SOLN
25.0000 ug/h | INTRAVENOUS | Status: DC
  Administered 2015-06-23: 50 ug/h via INTRAVENOUS
  Administered 2015-06-24: 100 ug/h via INTRAVENOUS
  Administered 2015-06-24 – 2015-06-25 (×2): 200 ug/h via INTRAVENOUS
  Filled 2015-06-23 (×4): qty 50

## 2015-06-23 MED ORDER — CHLORHEXIDINE GLUCONATE 0.12% ORAL RINSE (MEDLINE KIT)
15.0000 mL | Freq: Two times a day (BID) | OROMUCOSAL | Status: DC
  Administered 2015-06-23 – 2015-06-25 (×5): 15 mL via OROMUCOSAL

## 2015-06-23 MED ORDER — AMIODARONE LOAD VIA INFUSION
150.0000 mg | Freq: Once | INTRAVENOUS | Status: AC
  Administered 2015-06-23: 150 mg via INTRAVENOUS
  Filled 2015-06-23: qty 83.34

## 2015-06-23 MED ORDER — VITAL HIGH PROTEIN PO LIQD
1000.0000 mL | ORAL | Status: DC
  Administered 2015-06-23 – 2015-06-25 (×4): 1000 mL

## 2015-06-23 MED ORDER — SODIUM ACETATE 2 MEQ/ML IV SOLN
INTRAVENOUS | Status: DC
  Administered 2015-06-23 – 2015-06-25 (×3): via INTRAVENOUS
  Filled 2015-06-23 (×6): qty 1000

## 2015-06-23 MED ORDER — FENTANYL CITRATE (PF) 100 MCG/2ML IJ SOLN: 50.0000 ug | Freq: Once | INTRAMUSCULAR | Status: DC

## 2015-06-23 MED ORDER — HYDROCORTISONE NA SUCCINATE PF 100 MG IJ SOLR
50.0000 mg | Freq: Four times a day (QID) | INTRAMUSCULAR | Status: DC
  Administered 2015-06-23 – 2015-06-25 (×8): 50 mg via INTRAVENOUS
  Filled 2015-06-23 (×3): qty 1
  Filled 2015-06-23: qty 2
  Filled 2015-06-23 (×5): qty 1

## 2015-06-23 MED ORDER — LINEZOLID 600 MG PO TABS
600.0000 mg | ORAL_TABLET | Freq: Two times a day (BID) | ORAL | Status: DC
  Administered 2015-06-23 – 2015-06-26 (×6): 600 mg
  Filled 2015-06-23 (×6): qty 1

## 2015-06-23 MED ORDER — PRO-STAT SUGAR FREE PO LIQD
30.0000 mL | Freq: Two times a day (BID) | ORAL | Status: AC
Start: 2015-06-23 — End: 2015-06-23
  Administered 2015-06-23 (×2): 30 mL
  Filled 2015-06-23 (×2): qty 30

## 2015-06-23 MED ORDER — AMIODARONE HCL IN DEXTROSE 360-4.14 MG/200ML-% IV SOLN
30.0000 mg/h | INTRAVENOUS | Status: DC
  Administered 2015-06-24 – 2015-06-29 (×12): 30 mg/h via INTRAVENOUS
  Filled 2015-06-23 (×22): qty 200

## 2015-06-23 MED ORDER — FENTANYL BOLUS VIA INFUSION
50.0000 ug | INTRAVENOUS | Status: DC | PRN
  Administered 2015-06-23 – 2015-06-24 (×3): 50 ug via INTRAVENOUS
  Filled 2015-06-23: qty 50

## 2015-06-23 MED ORDER — LINEZOLID 600 MG PO TABS
600.0000 mg | ORAL_TABLET | Freq: Two times a day (BID) | ORAL | Status: DC
  Filled 2015-06-23 (×2): qty 1

## 2015-06-23 NOTE — Progress Notes (Signed)
Pharmacy Antibiotic Note  Marcie MowersSharon Blocher is a 45 y.o. female [facility] with LLE cellulitis and non-healing wounds. The patient was originally started on Vancomycin + Zosyn + Clindamycin. 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 [facility] are showing heavy growth of GNR. ID has been consulted and transitioned the patient to Amikacin + Unasyn + Vancomycin + Clindamycin on 5/5. Plans today are to change antibiotics to Colistin + Unasyn + Zyvox.  The patient is noted to have poor renal function but it is noted to be improving - SCr 2.91, CrCl~20-30 ml/min (normalized). Dosing weight is also an issue here with vast differences in IBW (45.5 kg), AdjBW (137 kg), and TBW (265 kg). Colistin dosing in obesity is a gray area and it is also renally adjusted. Will plan to load the patient with the MAX dose of 300 mg (pt received during a previous admit) and with start 1.5 mg/kg q36h based on AdjBW. This was discussed with several ID pharmacists Mayford Knife([place], [place], [place] and several drug references ([facility], [facility], [facility]) were viewed for this to be discussed and deemed an acceptable option for this patient.  Plan: 1. Colistin 300 mg LD IV x 1 followed by 200 mg IV every 36 hours 2. Adjust Unasyn to 3g IV every 6 hours 3. Continue Zyvox 600 mg IV every 12 hours per ID-MD 4. Will continue to follow renal function, culture results, LOT, and antibiotic de-escalation plans   Height: 5' (152.4 cm) Weight: (!) 585 lb (265.354 kg) IBW/kg (Calculated) : 45.5  Adjusted BW: 137 kg  Temp (24hrs), Avg:97.7 F (36.5 C), Min:97.3 F (36.3 C), Max:98.9 F (37.2 C)   Recent Labs Lab 06/20/15 1711 06/21/15 0601 06/21/15 1815 06/22/15 0448 06/22/15 0507 06/22/15 1521 06/23/15 0514  WBC 7.3 6.1  --   --  10.5  --  11.2*  CREATININE 3.88* 3.77*  --   --  3.64*  --  2.91*   LATICACIDVEN 2.0  --   --  1.4  --   --   --   VANCORANDOM  --   --  20  --   --  13  --     Estimated Creatinine Clearance: 51.5 mL/min (by C-G formula based on Cr of 2.91).    No Known Allergies  Antimicrobials this admission: Zosyn 5/3 >> 5/5 Vancomycin 5/3 >> 5/6 * 5/4 VR = 20 mcg/mL (~24 hours post first 2gm dose) * 5/5 VR = 13 mcg/ml (~45.5 hours post first 2g dose), pt specific ke~0.02 (t 1/2~35 hours) Clinda 5/3 >> 5/6 Amikacin 5/5 >> 5/6 Unasyn 5/5 >> Zyvox 5/6 >> Colistin IV 5/6 >>  Dose adjustments this admission: n/a  Microbiology results: Cultures from last admit:  4/5 WCx + BCx (old cx) >> MDR Acinetabacter (pan-R except I-Tobra/Zosyn/CTX, S-Unasyn/Ceftaz/Imi) 4/9 BCx (old cx) >> MDR Acinetobacter (pan-R except I-Ceftaz/Unasyn) * Also noted to be R-Avycaz per discussion with ID (Cassie)  Cultures from [place]: 5/3 Blood x 2 - ngtd 5/3 MRSA PCR negative 5/3 WCx >> heavy growth GNR  This admit:  5/5 WCx >> 5/5 BCx >>  Thank you for allowing pharmacy to be a part of this patient's care.  Georgina PillionElizabeth Yennifer Segovia, PharmD, BCPS Clinical Pharmacist Pager: [phone] 06/23/2015 9:38 AM

## 2015-06-23 NOTE — H&P (Signed)
PULMONARY / CRITICAL CARE MEDICINE   Name: Michele Weber MRN: [id number] DOB: [birth year]    ADMISSION DATE:  06/22/2015 CONSULTATION DATE:  06/22/15  REFERRING MD:  [facility]  CHIEF COMPLAINT:  Cellulitis LLE  HISTORY OF PRESENT ILLNESS:  Michele Weber is a 45 y.o. female with PMH as outlined below including super morbid obesity.  [facility] for gram negative bacteremia (acinetobacter) and was discharged to rehab facility.  She then was [facility] on [facility]15 for cellulitis of the left lower extremity / non-healing wounds.  At [facility] she was evaluated by surgery who recommended surgical debridement; however, [facility] felt that she would be better served at tertiary care center. [facility] and management including probable surgical I&D.  SUBJECTIVE: remains vented, return to OR expected, levo remains at 13  VITAL SIGNS: BP 97/91 mmHg  Pulse 75  Temp(Src) 97.6 F (36.4 C) (Oral)  Resp 16  Ht 5' (1.524 m)  Wt 265.354 kg (585 lb)  BMI 114.25 kg/m2  SpO2 100%  LMP 05/28/2015  HEMODYNAMICS:    VENTILATOR SETTINGS: Vent Mode:  [-] PRVC FiO2 (%):  [0.5 %-50 %] 40 % Set Rate:  [16 bmp] 16 bmp Vt Set:  [550 mL] 550 mL PEEP:  [5 cmH20] 5 cmH20 Plateau Pressure:  [20 cmH20-24 cmH20] 22 cmH20  INTAKE / OUTPUT: I/O last 3 completed shifts: In: 9410.8 [I.V.:7191.6; Blood:1165; IV Piggyback:1054.2] Out: 2200 [Urine:2100; Blood:100]   PHYSICAL EXAMINATION: General: Super morbid obese female, in NAD. Neuro: A&O x 3, non-focal.  HEENT: [place]/AT. PERRL, sclerae anicteric. Cardiovascular:s1 rrr distant Lungs: reduced Abdomen: Super morbidly obese.  BS not appreciable. Musculoskeletal: Massive lymphedema. Skin: Extensive wound to LLE that is very malodorous and discolored. Dressed, dry  LABS:  BMET  Recent Labs Lab 06/21/15 0601 06/22/15 0507 06/22/15 1649 06/23/15 0514  NA 133*  138 137 140  K 4.1 4.1 4.1 4.4  CL 101 103  --  108  CO2 20* 19*  --  17*  BUN 92* 85*  --  71*  CREATININE 3.77* 3.64*  --  2.91*  GLUCOSE 98 143*  --  134*    Electrolytes  Recent Labs Lab 06/21/15 0601 06/22/15 0507 06/23/15 0514  CALCIUM 8.0* 8.0* 7.6*  MG  --  1.6* 1.6*  PHOS  --  5.9* 4.8*    CBC  Recent Labs Lab 06/21/15 0601 06/22/15 0507 06/22/15 1649 06/23/15 0514  WBC 6.1 10.5  --  11.2*  HGB 10.7* 11.3* 11.6* 10.2*  HCT 33.3* 36.8 34.0* 33.0*  PLT 366 413*  --  340    Coag's  Recent Labs Lab 06/22/15 0915 06/22/15 1521 06/22/15 2100  INR 3.34* 1.80* 1.43    Sepsis Markers  Recent Labs Lab 06/20/15 1711 06/22/15 0448  LATICACIDVEN 2.0 1.4    ABG  Recent Labs Lab 06/22/15 1649 06/22/15 2044  PHART 7.348* 7.306*  PCO2ART 39.2 38.5  PO2ART 561.0* 210*    Liver Enzymes  Recent Labs Lab 06/20/15 1711  AST 15  ALT 15  ALKPHOS 69  BILITOT 0.6  ALBUMIN 2.9*    Cardiac Enzymes No results for input(s): TROPONINI, PROBNP in the last 168 hours.  Glucose  Recent Labs Lab 06/20/15 2233 06/21/15 0735 06/21/15 1119 06/21/15 1615 06/21/15 2102 06/22/15 0103  GLUCAP 107* 78 94 94 109* 124*    Imaging Dg Chest Port 1 View  06/22/2015  CLINICAL DATA:  Hypoxia  EXAM: PORTABLE CHEST 1 VIEW COMPARISON:  May 31, 2015 FINDINGS: Endotracheal tube tip is at the carina. Central catheter tip in superior vena cava near the cavoatrial junction. No pneumothorax. There is no edema or consolidation. Heart is enlarged with pulmonary vascularity within normal limits. No adenopathy. No bone lesions. IMPRESSION: Endotracheal tube tip is at the carina. Advise withdrawing approximately 3 cm. No edema or consolidation. No pneumothorax. There is cardiomegaly. Critical Value/emergent results were called by telephone at the time of interpretation on 06/22/2015 at 7:25 pm to Wendelyn Breslow, RN , who verbally acknowledged these results. Electronically Signed    By: Bretta Bang III M.D.   On: 06/22/2015 19:25     STUDIES:   CULTURES: Blood 05/05 > Wound 05/05 > [facility] wound gram ne rod>>>  ANTIBIOTICS: Vanc 05/05 > Amikacin 5/5>>> Clindamycin 05/05 > Unasyn 5/5>>>  SIGNIFICANT EVENTS: 05/05 > admitted with presumed necrotizing fasciitis of LLE, to OR  LINES/TUBES: None  DISCUSSION: 45 y.o. super morbidly obese F admitted 05/05 with presumed nec fasc of LLE.  Was transferred from [facility] for surgical I&D.  ASSESSMENT / PLAN:  INFECTIOUS A:   Shock - presumed septic due to LLE necrotizing fasciitis.  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 P:   OR Monday ABX per ID  PULMONARY A: Acute resp failure Traumatic intubation IN OR OSA / OHS P:   pcxr reviewed, allow pos balance Repeat abg for MV needs, may need higher rate Goal cpa 5 ps5-10, no extubation planned pcxr in am   CARDIOVASCULAR A:  Shock - presumed septic due to LLE necrotizing fasciitis. Hx A.fib, dCHF. P:  Levophed to MAP 60 Tele Cortisol then empiric steroids  RENAL A:   AoCKD. AG and NONAG ATN, ARF P:   Maintain pos balance Get cvp Add sodium acetate Change fluids to avoid saline with NONAG  GASTROINTESTINAL A:   Super morbid obesity. GERD. Nutrition. P:   Pantoprazole. NPO. Add feeds  HEMATOLOGIC A:   VTE Prophylaxis. P:  SCD's / Heparin. CBC in AM.  ENDOCRINE A:   Hypothyroidism. P:   Continue outpatient synthroid, change to IV formulation. cbg  NEUROLOGIC A:   Shock state enceph P:   Dc precedex Add fent    Family updated: None.  Interdisciplinary Family Meeting v Palliative Care Meeting:  Due by: 06/28/15.  CC time: 35 minutes.  Mcarthur Rossetti. Tyson Alias, MD, FACP Pgr: [phone] [facility]

## 2015-06-23 NOTE — Progress Notes (Signed)
1 Day Post-Op  Subjective: Intubated but awake  Objective: Vital signs in last 24 hours: Temp:  [97.3 F (36.3 C)-98.9 F (37.2 C)] 97.6 F (36.4 C) (05/06 0734) Pulse Rate:  [57-124] 57 (05/06 0800) Resp:  [15-23] 16 (05/06 0800) BP: (77-155)/(29-111) 107/54 mmHg (05/05 1545) SpO2:  [93 %-100 %] 100 % (05/06 0800) Arterial Line BP: (83-148)/(52-83) 110/66 mmHg (05/06 0800) FiO2 (%):  [0.5 %-50 %] 40 % (05/06 0401) Weight:  [265.354 kg (585 lb)] 265.354 kg (585 lb) (05/06 0500) Last BM Date: 06/21/15  Intake/Output from previous day: 05/05 0701 - 05/06 0700 In: 8091.2 [I.V.:6002; Blood:1135; IV Piggyback:954.2] Out: 1650 [Urine:1550; Blood:100] Intake/Output this shift: Total I/O In: 229.4 [I.V.:229.4] Out: 250 [Urine:250]  Intubated some sedation but follows commands Super obese Dressing intact. Will come back and inspect leg when nurses do dressing change.   Lab Results:   Recent Labs  06/22/15 0507 06/22/15 1649 06/23/15 0514  WBC 10.5  --  11.2*  HGB 11.3* 11.6* 10.2*  HCT 36.8 34.0* 33.0*  PLT 413*  --  340   BMET  Recent Labs  06/22/15 0507 06/22/15 1649 06/23/15 0514  NA 138 137 140  K 4.1 4.1 4.4  CL 103  --  108  CO2 19*  --  17*  GLUCOSE 143*  --  134*  BUN 85*  --  71*  CREATININE 3.64*  --  2.91*  CALCIUM 8.0*  --  7.6*   PT/INR  Recent Labs  06/22/15 1521 06/22/15 2100  LABPROT 20.9* 17.5*  INR 1.80* 1.43   ABG  Recent Labs  06/22/15 1649 06/22/15 2044  PHART 7.348* 7.306*  HCO3 21.8 18.9*    Studies/Results: Dg Chest 1 View  06/21/2015  CLINICAL DATA:  Followup for PICC placement. EXAM: CHEST 1 VIEW COMPARISON:  None. FINDINGS: Central catheter extends from the right neck to have its tip lie at the caval atrial junction, well positioned. Heart is normal size. Normal mediastinal and hilar contours. Clear lungs. No pneumothorax. IMPRESSION: PICC tip projects at the caval atrial junction, well positioned. No acute  cardiopulmonary disease. Electronically Signed   By: Amie Portland M.D.   On: 06/21/2015 19:31   Dg Chest Port 1 View  06/22/2015  CLINICAL DATA:  Hypoxia EXAM: PORTABLE CHEST 1 VIEW COMPARISON:  May 31, 2015 FINDINGS: Endotracheal tube tip is at the carina. Central catheter tip in superior vena cava near the cavoatrial junction. No pneumothorax. There is no edema or consolidation. Heart is enlarged with pulmonary vascularity within normal limits. No adenopathy. No bone lesions. IMPRESSION: Endotracheal tube tip is at the carina. Advise withdrawing approximately 3 cm. No edema or consolidation. No pneumothorax. There is cardiomegaly. Critical Value/emergent results were called by telephone at the time of interpretation on 06/22/2015 at 7:25 pm to Wendelyn Breslow, RN , who verbally acknowledged these results. Electronically Signed   By: Bretta Bang III M.D.   On: 06/22/2015 19:25    Anti-infectives: Anti-infectives    Start     Dose/Rate Route Frequency Ordered Stop   06/24/15 2000  amikacin (AMIKIN) 1,000 mg in dextrose 5 % 100 mL IVPB     1,000 mg 104 mL/hr over 60 Minutes Intravenous Every 48 hours 06/22/15 1608     06/22/15 2000  amikacin (AMIKIN) 1,050 mg in dextrose 5 % 100 mL IVPB     1,050 mg 104.2 mL/hr over 60 Minutes Intravenous  Once 06/22/15 1608 06/22/15 2341   06/22/15 1900  vancomycin (VANCOCIN) 2,500  mg in sodium chloride 0.9 % 500 mL IVPB     2,500 mg 250 mL/hr over 120 Minutes Intravenous Every 48 hours 06/22/15 1837     06/22/15 1800  Ampicillin-Sulbactam (UNASYN) 3 g in sodium chloride 0.9 % 100 mL IVPB     3 g 100 mL/hr over 60 Minutes Intravenous Every 8 hours 06/22/15 1608     06/22/15 0900  [MAR Hold]  cefTAZidime (FORTAZ) 2 g in dextrose 5 % 50 mL IVPB  Status:  Discontinued     (MAR Hold since 06/22/15 1556)   2 g 100 mL/hr over 30 Minutes Intravenous Every 12 hours 06/22/15 0826 06/22/15 1609   06/22/15 0300  clindamycin (CLEOCIN) IVPB 900 mg     900 mg 100  mL/hr over 30 Minutes Intravenous Every 8 hours 06/22/15 0158     06/22/15 0200  piperacillin-tazobactam (ZOSYN) IVPB 3.375 g  Status:  Discontinued     3.375 g 12.5 mL/hr over 240 Minutes Intravenous Every 8 hours 06/22/15 0158 06/22/15 0756      Assessment/Plan: Necrotizing soft tissue infection ARF Sepsis s/p Procedure(s): DEBRIDEMENT LEFT LEG INFECTION (Left)  Wean pressors as tolerated for MAP >65 Unless major issue with hemodynamics &/or wound, don't anticipate going back to OR til Monday due to logistics with moving pt  rec keep on Vent for now rec start enteral feeds Cont wound care  Mary SellaEric M. Andrey CampanileWilson, MD, FACS General, Bariatric, & Minimally Invasive Surgery [facility] [place]   LOS: 1 day    Atilano InaWILSON,Kamiryn Bezanson M 06/23/2015

## 2015-06-23 NOTE — Progress Notes (Signed)
eLink Physician-Brief Progress Note Patient Name: Marcie MowersSharon Manning DOB: 1970/05/11 MRN: [id number]   Date of Service  06/23/2015  HPI/Events of Note  AFIB with RVR. HR in 140's. Has Hx of AFIB. BP = 126/84 and is on Norepinephrine IV infusion.   eICU Interventions  Will order: 1. Amiodarone IV bolus and infusion.  2. BMP and Mg++ level STAT.     Intervention Category Major Interventions: Arrhythmia - evaluation and management  Sommer,Steven Eugene 06/23/2015, 10:28 PM

## 2015-06-23 NOTE — Progress Notes (Signed)
Arterial line insertion attempted x2, unsuccessful. RN made aware.

## 2015-06-23 NOTE — Progress Notes (Signed)
Subjective: Intubated sedated Antibiotics:  Anti-infectives    Start     Dose/Rate Route Frequency Ordered Stop   06/24/15 2000  amikacin (AMIKIN) 1,000 mg in dextrose 5 % 100 mL IVPB  Status:  Discontinued     1,000 mg 104 mL/hr over 60 Minutes Intravenous Every 48 hours 06/22/15 1608 06/23/15 1417   06/23/15 1530  linezolid (ZYVOX) 100 MG/5ML suspension 600 mg     600 mg Oral Every 12 hours 06/23/15 1407     06/22/15 2000  amikacin (AMIKIN) 1,050 mg in dextrose 5 % 100 mL IVPB     1,050 mg 104.2 mL/hr over 60 Minutes Intravenous  Once 06/22/15 1608 06/22/15 2341   06/22/15 1900  vancomycin (VANCOCIN) 2,500 mg in sodium chloride 0.9 % 500 mL IVPB  Status:  Discontinued     2,500 mg 250 mL/hr over 120 Minutes Intravenous Every 48 hours 06/22/15 1837 06/23/15 1407   06/22/15 1800  Ampicillin-Sulbactam (UNASYN) 3 g in sodium chloride 0.9 % 100 mL IVPB     3 g 100 mL/hr over 60 Minutes Intravenous Every 8 hours 06/22/15 1608     06/22/15 0900  [MAR Hold]  cefTAZidime (FORTAZ) 2 g in dextrose 5 % 50 mL IVPB  Status:  Discontinued     (MAR Hold since 06/22/15 1556)   2 g 100 mL/hr over 30 Minutes Intravenous Every 12 hours 06/22/15 0826 06/22/15 1609   06/22/15 0300  clindamycin (CLEOCIN) IVPB 900 mg  Status:  Discontinued     900 mg 100 mL/hr over 30 Minutes Intravenous Every 8 hours 06/22/15 0158 06/23/15 1407   06/22/15 0200  piperacillin-tazobactam (ZOSYN) IVPB 3.375 g  Status:  Discontinued     3.375 g 12.5 mL/hr over 240 Minutes Intravenous Every 8 hours 06/22/15 0158 06/22/15 0756      Medications: Scheduled Meds: . sodium chloride   Intravenous Once  . ampicillin-sulbactam (UNASYN) IV  3 g Intravenous Q8H  . antiseptic oral rinse  7 mL Mouth Rinse q12n4p  . antiseptic oral rinse  7 mL Mouth Rinse QID  . chlorhexidine  15 mL Mouth Rinse BID  . chlorhexidine gluconate (SAGE KIT)  15 mL Mouth Rinse BID  . feeding supplement (PRO-STAT SUGAR FREE 64)  30 mL Per Tube  BID  . feeding supplement (VITAL HIGH PROTEIN)  1,000 mL Per Tube Q24H  . fentaNYL (SUBLIMAZE) injection  50 mcg Intravenous Once  . hydrocortisone sod succinate (SOLU-CORTEF) inj  50 mg Intravenous Q6H  . levothyroxine  25 mcg Intravenous Daily  . linezolid  600 mg Oral Q12H  . pantoprazole (PROTONIX) IV  40 mg Intravenous Q24H  . sodium chloride flush  10-40 mL Intracatheter Q12H   Continuous Infusions: . sodium chloride 100 mL/hr at 06/23/15 1112  . dexmedetomidine Stopped (06/23/15 1049)  . fentaNYL infusion INTRAVENOUS 50 mcg/hr (06/23/15 1113)  . norepinephrine (LEVOPHED) Adult infusion 13 mcg/min (06/23/15 0946)  . sodium acetate 150 mEq in dextrose 5 % 1,000 mL infusion 50 mL/hr at 06/23/15 1233   PRN Meds:.sodium chloride, fentaNYL, sodium chloride flush    Objective: Weight change: -21 lb (-9.526 kg)  Intake/Output Summary (Last 24 hours) at 06/23/15 1418 Last data filed at 06/23/15 1300  Gross per 24 hour  Intake 6600.02 ml  Output   1900 ml  Net 4700.02 ml   Blood pressure 108/60, pulse 86, temperature 97.5 F (36.4 C), temperature source Oral, resp. rate 18, height 5' (1.524 m), weight  585 lb (265.354 kg), last menstrual period 05/28/2015, SpO2 100 %. Temp:  [97.3 F (36.3 C)-98.4 F (36.9 C)] 97.5 F (36.4 C) (05/06 1125) Pulse Rate:  [57-107] 86 (05/06 1300) Resp:  [15-23] 18 (05/06 1300) BP: (96-115)/(49-91) 108/60 mmHg (05/06 1221) SpO2:  [98 %-100 %] 100 % (05/06 1300) Arterial Line BP: (83-148)/(52-83) 114/63 mmHg (05/06 1300) FiO2 (%):  [0.5 %-50 %] 30 % (05/06 1221) Weight:  [585 lb (265.354 kg)] 585 lb (265.354 kg) (05/06 0500)  Physical Exam: General:Intubated sedated morbidly obese  HEENT:EOMI CVS regular rate, normal r,  no murmur rubs or gallops Chest: rhonchi Abdomen: soft  Nondistended, Extremities left lower external knee with extensive dressing in place  Neuro: nonfocal  CBC:  CBC Latest Ref Rng 06/23/2015 06/22/2015 06/22/2015  WBC 4.0  - 10.5 K/uL 11.2(H) - 10.5  Hemoglobin 12.0 - 15.0 g/dL 10.2(L) 11.6(L) 11.3(L)  Hematocrit 36.0 - 46.0 % 33.0(L) 34.0(L) 36.8  Platelets 150 - 400 K/uL 340 - 413(H)       BMET  Recent Labs  06/22/15 0507 06/22/15 1649 06/23/15 0514  NA 138 137 140  K 4.1 4.1 4.4  CL 103  --  108  CO2 19*  --  17*  GLUCOSE 143*  --  134*  BUN 85*  --  71*  CREATININE 3.64*  --  2.91*  CALCIUM 8.0*  --  7.6*     Liver Panel   Recent Labs  06/20/15 1711  PROT 7.5  ALBUMIN 2.9*  AST 15  ALT 15  ALKPHOS 69  BILITOT 0.6       Sedimentation Rate No results for input(s): ESRSEDRATE in the last 72 hours. C-Reactive Protein No results for input(s): CRP in the last 72 hours.  Micro Results: Recent Results (from the past 720 hour(s))  Culture, blood (routine x 2)     Status: Abnormal   Collection Time: 05/27/15 12:01 PM  Result Value Ref Range Status   Specimen Description BLOOD CENTRAL LINE  Final   Special Requests BOTTLES DRAWN AEROBIC AND ANAEROBIC 10CC  Final   Culture  Setup Time   Final    GRAM NEGATIVE COCCOBACILLI IN BOTH AEROBIC AND ANAEROBIC BOTTLES CRITICAL RESULT CALLED TO, READ BACK BY AND VERIFIED WITH: TO BSHEPARD(RN) BY TCLEVELAND 05/28/2015 AT 2:52AM CORRECTED RESULTS CALLED TO: L WILSON,RN AT 4270 05/28/15 BY L BENFIELD PREVIOUSLY REPORTED AS GRAM POSITIVE COCCI    Culture (A)  Final    ACINETOBACTER CALCOACETICUS/BAUMANNII COMPLEX MULTI-DRUG RESISTANT ORGANISM SEE SEPARATE REPORT FOR COLISITIN, AMIKACIN, MINOCYCLINE AND TETRACYCLINE SENSITIVITIES Performed at [facility] SEE SEPARATE REPORT FOR AVYCAZ SUSCEPTIBILITY Performed at [place]    Report Status 06/06/2015 FINAL  Final   Organism ID, Bacteria ACINETOBACTER CALCOACETICUS/BAUMANNII COMPLEX  Final      Susceptibility   Acinetobacter calcoaceticus/baumannii complex - MIC*    CEFTAZIDIME 16 INTERMEDIATE Intermediate     CEFTRIAXONE >=64 RESISTANT Resistant      CIPROFLOXACIN >=4 RESISTANT Resistant     GENTAMICIN >=16 RESISTANT Resistant     IMIPENEM >=16 RESISTANT Resistant     PIP/TAZO >=128 RESISTANT Resistant     TRIMETH/SULFA >=320 RESISTANT Resistant     AMPICILLIN/SULBACTAM 16 INTERMEDIATE Intermediate     * ACINETOBACTER CALCOACETICUS/BAUMANNII COMPLEX  Culture, blood (routine x 2)     Status: Abnormal   Collection Time: 05/27/15 12:07 PM  Result Value Ref Range Status   Specimen Description BLOOD RIGHT HAND  Final   Special Requests BOTTLES DRAWN  AEROBIC ONLY 5CC  Final   Culture  Setup Time   Final    GRAM NEGATIVE COCCOBACILLI AEROBIC BOTTLE ONLY CRITICAL RESULT CALLED TO, READ BACK BY AND VERIFIED WITH: TO BSHEPARD (RN) BY TCLEVELAND AT 2:54AM CORRECTED RESULTS CALLED TO: L WILSON,RN AT 3710 05/28/15 BY L BENFIELD PREVIOUSLY REPORTED AS GRAM POSITIVE COCCI    Culture (A)  Final    ACINETOBACTER CALCOACETICUS/BAUMANNII COMPLEX SUSCEPTIBILITIES PERFORMED ON PREVIOUS CULTURE WITHIN THE LAST 5 DAYS.    Report Status 05/31/2015 FINAL  Final  Culture, blood (Routine X 2) w Reflex to ID Panel     Status: None   Collection Time: 05/29/15  9:43 AM  Result Value Ref Range Status   Specimen Description BLOOD LEFT ARM  Final   Special Requests IN PEDIATRIC BOTTLE  4CC  Final   Culture NO GROWTH 5 DAYS  Final   Report Status 06/03/2015 FINAL  Final  Culture, blood (Routine X 2) w Reflex to ID Panel     Status: None   Collection Time: 05/29/15  9:50 AM  Result Value Ref Range Status   Specimen Description BLOOD LEFT ARM  Final   Special Requests IN PEDIATRIC BOTTLE  4CC  Final   Culture NO GROWTH 5 DAYS  Final   Report Status 06/03/2015 FINAL  Final  Culture, blood (Routine X 2) w Reflex to ID Panel     Status: None   Collection Time: 06/01/15  3:36 PM  Result Value Ref Range Status   Specimen Description BLOOD RIGHT HAND  Final   Special Requests IN PEDIATRIC BOTTLE 4CC  Final   Culture NO GROWTH 5 DAYS  Final   Report Status  06/06/2015 FINAL  Final  Culture, blood (Routine X 2) w Reflex to ID Panel     Status: None   Collection Time: 06/01/15  3:46 PM  Result Value Ref Range Status   Specimen Description BLOOD RIGHT ANTECUBITAL  Final   Special Requests IN PEDIATRIC BOTTLE 3CC  Final   Culture NO GROWTH 5 DAYS  Final   Report Status 06/06/2015 FINAL  Final  C difficile quick scan w PCR reflex     Status: None   Collection Time: 06/06/15 12:11 PM  Result Value Ref Range Status   C Diff antigen NEGATIVE NEGATIVE Final   C Diff toxin NEGATIVE NEGATIVE Final   C Diff interpretation Negative for toxigenic C. difficile  Final  Culture, blood (routine x 2)     Status: None (Preliminary result)   Collection Time: 06/20/15  5:11 PM  Result Value Ref Range Status   Specimen Description BLOOD RIGHT ASSIST CONTROL  Final   Special Requests BOTTLES DRAWN AEROBIC AND ANAEROBIC  5CC  Final   Culture NO GROWTH 3 DAYS  Final   Report Status PENDING  Incomplete  Culture, blood (routine x 2)     Status: None (Preliminary result)   Collection Time: 06/20/15  5:11 PM  Result Value Ref Range Status   Specimen Description BLOOD LEFT ASSIST CONTROL  Final   Special Requests BOTTLES DRAWN AEROBIC AND ANAEROBIC  5CC  Final   Culture NO GROWTH 3 DAYS  Final   Report Status PENDING  Incomplete  Wound culture     Status: None (Preliminary result)   Collection Time: 06/20/15  5:11 PM  Result Value Ref Range Status   Specimen Description WOUND  Final   Special Requests Normal  Final   Gram Stain   Final    FEW  WBC SEEN MANY GRAM NEGATIVE RODS FEW GRAM POSITIVE COCCI    Culture   Final    HEAVY GROWTH GRAM NEGATIVE RODS TRYING TO ISOLATE MULTIPLE POSSIBLE PATHOGENS    Report Status PENDING  Incomplete  Surgical PCR screen     Status: None   Collection Time: 06/20/15 10:10 PM  Result Value Ref Range Status   MRSA, PCR NEGATIVE NEGATIVE Final   Staphylococcus aureus NEGATIVE NEGATIVE Final    Comment:        The Xpert SA  Assay (FDA approved for NASAL specimens in patients over 97 years of age), is one component of a comprehensive surveillance program.  Test performance has been validated by [facility] for patients greater than or equal to 31 year old. It is not intended to diagnose infection nor to guide or monitor treatment.   Wound culture     Status: None (Preliminary result)   Collection Time: 06/22/15  3:04 AM  Result Value Ref Range Status   Specimen Description WOUND  Final   Special Requests LEFT LEG  Final   Gram Stain PENDING  Incomplete   Culture   Final    Culture reincubated for better growth Performed at [facility]    Report Status PENDING  Incomplete    Studies/Results: Dg Chest 1 View  06/21/2015  CLINICAL DATA:  Followup for PICC placement. EXAM: CHEST 1 VIEW COMPARISON:  None. FINDINGS: Central catheter extends from the right neck to have its tip lie at the caval atrial junction, well positioned. Heart is normal size. Normal mediastinal and hilar contours. Clear lungs. No pneumothorax. IMPRESSION: PICC tip projects at the caval atrial junction, well positioned. No acute cardiopulmonary disease. Electronically Signed   By: Lajean Manes M.D.   On: 06/21/2015 19:31   Dg Chest Port 1 View  06/22/2015  CLINICAL DATA:  Hypoxia EXAM: PORTABLE CHEST 1 VIEW COMPARISON:  May 31, 2015 FINDINGS: Endotracheal tube tip is at the carina. Central catheter tip in superior vena cava near the cavoatrial junction. No pneumothorax. There is no edema or consolidation. Heart is enlarged with pulmonary vascularity within normal limits. No adenopathy. No bone lesions. IMPRESSION: Endotracheal tube tip is at the carina. Advise withdrawing approximately 3 cm. No edema or consolidation. No pneumothorax. There is cardiomegaly. Critical Value/emergent results were called by telephone at the time of interpretation on 06/22/2015 at 7:25 pm to Loma Boston, RN , who verbally acknowledged these results.  Electronically Signed   By: Lowella Grip III M.D.   On: 06/22/2015 19:25      Assessment/Plan:  INTERVAL HISTORY:   Pt sp extensive surgery by  Dr. 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 female with morbid obesity, CKD (not HD candidate) previous acinetobacter (S- colistin, amikacin. R- avycaz) bacteremia and wound Cx 05-2015 (souce wound?, treated with imipenem). 2/3 were sensitive to unasyn. By the time her sensitivities had returned, she was improved, and was able to be d/c. She was prev living at [facility]. She was adm to [facility] on 5-3 with cellulitis on her L thigh and worsening of her wounds. The wound worsened at her facility. She was started on vanco/zosyn. BCx ngtd, wound Cx heavy growth GNR., still not isolated due to mx organisms being present and need for re-incubation. Shewas in  septic shock requiring levofed, coagulopathy, and was felt to have necrotizing fascitis. She was transferred to [facility] t for surgical eval, CCM services. She was started on vanco/clinda/zosyn.--> vanco/unasyn/amikacin now sp extensive debridement of necrotic tissue in the OR on 3 operating tables. She remanis of levophed 15 at present  #1 Necrotizing fasciitis:   --Will change her vancomycin and clindamycin and replace them with Zyvox which will have excellent MRSA coverage strep coverage and toxin inhibition via the same mechanism of action as clindamycin.  --Will continue Unasyn to harness sulbactam potential activity against Acinetobacter --Will replace amikacin with colistin for better agent against again potential multidrug resistant Acinetobacter that was isolated before from blood  --the other critical issue will be repeat debridements as necessary and CCS to examine wounds today to determine that  #2 MDR  acenitobacter: see above going to colistin and unasyn  I spent greater than 40 minutes with the patient including greater than 50% of time in face to face counsel of the patients family and in coordination of their care with CCM, Pharmacy re MDR organism, necrotizing fascitis.    LOS: 1 day   Alcide Evener 06/23/2015, 2:18 PM

## 2015-06-23 NOTE — Progress Notes (Signed)
Nutrition Follow-up  DOCUMENTATION CODES:   Morbid obesity  INTERVENTION:    Initiate TF via Cortrak tube with Vital High Protein at 40 ml/h and Prostat 30 ml BID on day 1; on day 2, increase to goal rate of 80 ml/h (1920 ml per day) to provide 1920 kcals, 168 gm protein, 1605 ml free water daily.  NUTRITION DIAGNOSIS:   Increased nutrient needs related to wound healing as evidenced by estimated needs.  Ongoing  GOAL:   Provide needs based on ASPEN/SCCM guidelines  Unmet  MONITOR:   Vent status, Labs, Weight trends, TF tolerance, Skin, I & O's  REASON FOR ASSESSMENT:   Ventilator    ASSESSMENT:   45 year old woman morbidly obese [facility] on 7[facility] for cellulitis of her left thigh and nonhealing wounds-[facility] that [facility]. She was hypotensive and required pressors prior to transfer. Admitted with necrotizing soft tissue infection to [facility].  S/P sharp incision/ debridement/ irrigation of skin, subcutaneous fat, muscle - left lower extremity on 5/5. No plans to return to OR until Monday. Surgery team recommends starting enteral feedings.  Labs reviewed: phosphorus elevated, magnesium low. No enteral access at this time, plans to have Cortrak placed today. Okay for RD to order TF protocol per Dr. Tyson AliasFeinstein. Patient is currently intubated on ventilator support MV: 9 L/min Temp (24hrs), Avg:97.7 F (36.5 C), Min:97.3 F (36.3 C), Max:98.9 F (37.2 C)  Propofol: none  Diet Order:  Diet NPO time specified  Skin:  Wound (see comment) (Bil legs open weeping wounds/cellulitis; Left hip wound)  Last BM:  5/4  Height:   Ht Readings from Last 1 Encounters:  06/22/15 5' (1.524 m)    Weight:   Wt Readings from Last 1 Encounters:  06/23/15 585 lb (265.354 kg)    Ideal Body Weight:  45.5 kg  BMI:  Body mass index is 114.25 kg/(m^2).  Estimated Nutritional Needs:   Kcal:  2000  Protein:   150 gm  Fluid:  2.5 L  EDUCATION NEEDS:   No education needs identified at this time  Joaquin CourtsKimberly Harris, RD, LDN, CNSC Pager [phone] After Hours Pager [phone]

## 2015-06-24 ENCOUNTER — Inpatient Hospital Stay (HOSPITAL_COMMUNITY): Payer: Medicare Other

## 2015-06-24 DIAGNOSIS — Z0189 Encounter for other specified special examinations: Secondary | ICD-10-CM

## 2015-06-24 LAB — COMPREHENSIVE METABOLIC PANEL
ALT: 14 U/L (ref 14–54)
AST: 13 U/L — AB (ref 15–41)
Albumin: 1.7 g/dL — ABNORMAL LOW (ref 3.5–5.0)
Alkaline Phosphatase: 58 U/L (ref 38–126)
Anion gap: 14 (ref 5–15)
BILIRUBIN TOTAL: 0.4 mg/dL (ref 0.3–1.2)
BUN: 59 mg/dL — AB (ref 6–20)
CALCIUM: 7.6 mg/dL — AB (ref 8.9–10.3)
CHLORIDE: 107 mmol/L (ref 101–111)
CO2: 20 mmol/L — ABNORMAL LOW (ref 22–32)
CREATININE: 2.62 mg/dL — AB (ref 0.44–1.00)
GFR, EST AFRICAN AMERICAN: 24 mL/min — AB (ref >60)
GFR, EST NON AFRICAN AMERICAN: 21 mL/min — AB (ref >60)
Glucose, Bld: 169 mg/dL — ABNORMAL HIGH (ref 65–99)
Potassium: 3.6 mmol/L (ref 3.5–5.1)
Sodium: 141 mmol/L (ref 135–145)
TOTAL PROTEIN: 4.6 g/dL — AB (ref 6.5–8.1)

## 2015-06-24 LAB — CBC WITH DIFFERENTIAL/PLATELET
BASOS ABS: 0.2 10*3/uL — AB (ref 0.0–0.1)
Basophils Relative: 1 %
EOS ABS: 0.3 10*3/uL (ref 0.0–0.7)
EOS PCT: 2 %
HEMATOCRIT: 29.6 % — AB (ref 36.0–46.0)
Hemoglobin: 9.1 g/dL — ABNORMAL LOW (ref 12.0–15.0)
LYMPHS ABS: 1 10*3/uL (ref 0.7–4.0)
Lymphocytes Relative: 6 %
MCH: 29.6 pg (ref 26.0–34.0)
MCHC: 30.7 g/dL (ref 30.0–36.0)
MCV: 96.4 fL (ref 78.0–100.0)
MONO ABS: 0.5 10*3/uL (ref 0.1–1.0)
Monocytes Relative: 3 %
NEUTROS PCT: 88 %
Neutro Abs: 15 10*3/uL — ABNORMAL HIGH (ref 1.7–7.7)
PLATELETS: 265 10*3/uL (ref 150–400)
RBC: 3.07 MIL/uL — AB (ref 3.87–5.11)
RDW: 18.1 % — AB (ref 11.5–15.5)
WBC: 17 10*3/uL — AB (ref 4.0–10.5)

## 2015-06-24 LAB — GLUCOSE, CAPILLARY
GLUCOSE-CAPILLARY: 154 mg/dL — AB (ref 65–99)
GLUCOSE-CAPILLARY: 132 mg/dL — AB (ref 65–99)
GLUCOSE-CAPILLARY: 130 mg/dL — AB (ref 65–99)
GLUCOSE-CAPILLARY: 152 mg/dL — AB (ref 65–99)
Glucose-Capillary: 317 mg/dL — ABNORMAL HIGH (ref 65–99)
Glucose-Capillary: 135 mg/dL — ABNORMAL HIGH (ref 65–99)

## 2015-06-24 LAB — CALCIUM, IONIZED: Calcium, Ionized, Serum: 4.3 mg/dL — ABNORMAL LOW (ref 4.5–5.6)

## 2015-06-24 MED ORDER — INSULIN ASPART 100 UNIT/ML ~~LOC~~ SOLN
1.0000 [IU] | SUBCUTANEOUS | Status: DC
  Administered 2015-06-24: 3 [IU] via SUBCUTANEOUS
  Administered 2015-06-24: 2 [IU] via SUBCUTANEOUS
  Administered 2015-06-24 – 2015-06-25 (×4): 1 [IU] via SUBCUTANEOUS
  Administered 2015-06-25: 2 [IU] via SUBCUTANEOUS
  Administered 2015-06-25 – 2015-06-29 (×4): 1 [IU] via SUBCUTANEOUS

## 2015-06-24 MED ORDER — SODIUM CHLORIDE 0.9 % IV SOLN
70.0000 mg | Freq: Two times a day (BID) | INTRAVENOUS | Status: DC
  Administered 2015-06-25 – 2015-06-26 (×3): 70 mg via INTRAVENOUS
  Filled 2015-06-24 (×5): qty 0.93

## 2015-06-24 MED ORDER — POTASSIUM CHLORIDE 10 MEQ/50ML IV SOLN
10.0000 meq | INTRAVENOUS | Status: AC
  Administered 2015-06-24 (×2): 10 meq via INTRAVENOUS
  Filled 2015-06-24 (×2): qty 50

## 2015-06-24 MED ORDER — MAGNESIUM SULFATE 2 GM/50ML IV SOLN
2.0000 g | Freq: Once | INTRAVENOUS | Status: AC
  Administered 2015-06-24: 2 g via INTRAVENOUS
  Filled 2015-06-24: qty 50

## 2015-06-24 NOTE — Progress Notes (Signed)
2 Days Post-Op  Subjective: Intubated but awake  Objective: Vital signs in last 24 hours: Temp:  [97.1 F (36.2 C)-97.9 F (36.6 C)] 97.1 F (36.2 C) (05/07 0739) Pulse Rate:  [71-137] 131 (05/07 0833) Resp:  [16-38] 23 (05/07 0833) BP: (98-126)/(57-102) 104/78 mmHg (05/07 0833) SpO2:  [100 %] 100 % (05/07 0833) Arterial Line BP: (84-114)/(63-93) 87/65 mmHg (05/06 1700) FiO2 (%):  [30 %] 30 % (05/07 0833) Last BM Date: 06/21/15  Intake/Output from previous day: 05/06 0701 - 05/07 0700 In: 4035.7 [I.V.:3531.7; IV Piggyback:504] Out: 1575 [Urine:1575] Intake/Output this shift: Total I/O In: 1425.7 [I.V.:1325.7; IV Piggyback:100] Out: -   lle dressing changed this am, dorsum of foot clean, the left lower leg medial wound is soupy and may need more debridement, the other two very large wounds have dusky adipose tissue but I dont think active infection  Lab Results:   Recent Labs  06/23/15 0514 06/24/15 0547  WBC 11.2* 17.0*  HGB 10.2* 9.1*  HCT 33.0* 29.6*  PLT 340 265   BMET  Recent Labs  06/23/15 2314 06/24/15 0547  NA 139 141  K 3.6 3.6  CL 108 107  CO2 20* 20*  GLUCOSE 290* 169*  BUN 59* 59*  CREATININE 2.64* 2.62*  CALCIUM 7.3* 7.6*   PT/INR  Recent Labs  06/22/15 1521 06/22/15 2100  LABPROT 20.9* 17.5*  INR 1.80* 1.43   ABG  Recent Labs  06/22/15 2044 06/23/15 1021  PHART 7.306* 7.321*  HCO3 18.9* 18.0*    Studies/Results: Dg Chest Port 1 View  06/24/2015  CLINICAL DATA:  Hypoxia EXAM: PORTABLE CHEST 1 VIEW COMPARISON:  Jun 22, 2015 FINDINGS: Endotracheal tube tip is 2.4 cm above the carina. Nasogastric tube tip and side port are below the diaphragm. Central catheter tip is in the superior vena cava. No pneumothorax. There is no edema or consolidation. There is cardiomegaly with pulmonary vascularity within normal limits. No adenopathy evident. IMPRESSION: Tube and catheter positions without pneumothorax. Stable cardiomegaly. No edema or  consolidation. Electronically Signed   By: Bretta Bang III M.D.   On: 06/24/2015 07:55   Dg Chest Port 1 View  06/22/2015  CLINICAL DATA:  Hypoxia EXAM: PORTABLE CHEST 1 VIEW COMPARISON:  May 31, 2015 FINDINGS: Endotracheal tube tip is at the carina. Central catheter tip in superior vena cava near the cavoatrial junction. No pneumothorax. There is no edema or consolidation. Heart is enlarged with pulmonary vascularity within normal limits. No adenopathy. No bone lesions. IMPRESSION: Endotracheal tube tip is at the carina. Advise withdrawing approximately 3 cm. No edema or consolidation. No pneumothorax. There is cardiomegaly. Critical Value/emergent results were called by telephone at the time of interpretation on 06/22/2015 at 7:25 pm to Wendelyn Breslow, RN , who verbally acknowledged these results. Electronically Signed   By: Bretta Bang III M.D.   On: 06/22/2015 19:25   Dg Abd Portable 1v  06/23/2015  CLINICAL DATA:  45 year old female with a history of orogastric tube placement EXAM: PORTABLE ABDOMEN - 1 VIEW COMPARISON:  06/22/2015, 05/31/2015, 05/28/2015 FINDINGS: Limited plain film demonstrating interval placement of gastric tube terminating in the left upper quadrant. The side port terminates near the GE junction, slightly below the diaphragm level. IMPRESSION: Interval placement of gastric tube terminating in the stomach left upper quadrant. Side port terminates near the GE junction, likely in the cardia. Signed, Yvone Neu. Loreta Ave, DO [facility] Electronically Signed   By: Gilmer Mor D.O.   On:  06/23/2015 17:57    Anti-infectives: Anti-infectives    Start     Dose/Rate Route Frequency Ordered Stop   06/25/15 0500  colistimethate (COLYMYCIN) 200 mg in sodium chloride 0.9 % 100 mL IVPB     200 mg 205.3 mL/hr over 30 Minutes Intravenous Every 36 hours 06/23/15 1535     06/24/15 2000  amikacin (AMIKIN) 1,000 mg in dextrose 5 %  100 mL IVPB  Status:  Discontinued     1,000 mg 104 mL/hr over 60 Minutes Intravenous Every 48 hours 06/22/15 1608 06/23/15 1417   06/23/15 1800  Ampicillin-Sulbactam (UNASYN) 3 g in sodium chloride 0.9 % 100 mL IVPB     3 g 100 mL/hr over 60 Minutes Intravenous Every 6 hours 06/23/15 1435     06/23/15 1800  linezolid (ZYVOX) tablet 600 mg     600 mg Per Tube Every 12 hours 06/23/15 1706     06/23/15 1700  colistimethate (COLYMYCIN) 300 mg in sodium chloride 0.9 % 100 mL IVPB     300 mg 208 mL/hr over 30 Minutes Intravenous  Once 06/23/15 1535 06/23/15 1746   06/23/15 1530  linezolid (ZYVOX) 100 MG/5ML suspension 600 mg  Status:  Discontinued     600 mg Oral Every 12 hours 06/23/15 1407 06/23/15 1419   06/23/15 1430  linezolid (ZYVOX) tablet 600 mg  Status:  Discontinued     600 mg Per Tube Every 12 hours 06/23/15 1419 06/23/15 1706   06/22/15 2000  amikacin (AMIKIN) 1,050 mg in dextrose 5 % 100 mL IVPB     1,050 mg 104.2 mL/hr over 60 Minutes Intravenous  Once 06/22/15 1608 06/22/15 2341   06/22/15 1900  vancomycin (VANCOCIN) 2,500 mg in sodium chloride 0.9 % 500 mL IVPB  Status:  Discontinued     2,500 mg 250 mL/hr over 120 Minutes Intravenous Every 48 hours 06/22/15 1837 06/23/15 1407   06/22/15 1800  Ampicillin-Sulbactam (UNASYN) 3 g in sodium chloride 0.9 % 100 mL IVPB  Status:  Discontinued     3 g 100 mL/hr over 60 Minutes Intravenous Every 8 hours 06/22/15 1608 06/23/15 1435   06/22/15 0900  [MAR Hold]  cefTAZidime (FORTAZ) 2 g in dextrose 5 % 50 mL IVPB  Status:  Discontinued     (MAR Hold since 06/22/15 1556)   2 g 100 mL/hr over 30 Minutes Intravenous Every 12 hours 06/22/15 0826 06/22/15 1609   06/22/15 0300  clindamycin (CLEOCIN) IVPB 900 mg  Status:  Discontinued     900 mg 100 mL/hr over 30 Minutes Intravenous Every 8 hours 06/22/15 0158 06/23/15 1407   06/22/15 0200  piperacillin-tazobactam (ZOSYN) IVPB 3.375 g  Status:  Discontinued     3.375 g 12.5 mL/hr over 240  Minutes Intravenous Every 8 hours 06/22/15 0158 06/22/15 0756      Assessment/Plan: Necrotizing soft tissue infection POD 2 s/p debridement - Dr Corliss Skainssuei ARF Sepsis  Wean pressors as tolerated for MAP >65 Wounds look ok today but I think a couple will need some additional debridement, continue dressing changes, will evaluate in am to take back to or rec keep on Vent for now rec start enteral feeds, npo after midnight Appreciate icu care for her  [facility] Hospital[facility]KEFIELD,Janace Decker 06/24/2015

## 2015-06-24 NOTE — H&P (Signed)
PULMONARY / CRITICAL CARE MEDICINE   Name: Shanley Furlough MRN: [id number] DOB: 02-17-1971    ADMISSION DATE:  06/22/2015 CONSULTATION DATE:  06/22/15  REFERRING MD:  TRH at [facility]  CHIEF COMPLAINT:  Cellulitis LLE  HISTORY OF PRESENT ILLNESS:  Adira Limburg is a 45 y.o. female with PMH as outlined below including super morbid obesity.  She had recent admission to [facility] for gram negative bacteremia (acinetobacter) and was discharged to rehab facility.  She then was admitted to [facility] on 06/20/15 for cellulitis of the left lower extremity / non-healing wounds.  At [facility] she was evaluated by surgery who recommended surgical debridement; however, anesthesia services at [facility] felt that she would be better served at tertiary care center. She was subsequently transferred to [facility] for further evaluation and management including probable surgical I&D.  SUBJECTIVE:  Fib rvr, amio Levo low dose  VITAL SIGNS: BP 112/83 mmHg  Pulse 116  Temp(Src) 97.6 F (36.4 C) (Oral)  Resp 21  Ht 5' (1.524 m)  Wt 265.354 kg (585 lb)  BMI 114.25 kg/m2  SpO2 100%  LMP 05/28/2015  HEMODYNAMICS: CVP:  [5 mmHg-14 mmHg] 13 mmHg  VENTILATOR SETTINGS: Vent Mode:  [-] PRVC FiO2 (%):  [30 %-40 %] 30 % Set Rate:  [16 bmp] 16 bmp Vt Set:  [550 mL] 550 mL PEEP:  [5 cmH20] 5 cmH20 Plateau Pressure:  [16 cmH20-23 cmH20] 23 cmH20  INTAKE / OUTPUT: I/O last 3 completed shifts: In: 8076.9 [I.V.:6618.7; IV Piggyback:1458.2] Out: 3125 [Urine:3125]   PHYSICAL EXAMINATION: General: Super morbid obese female Neuro: Alert, appropriate, moves all ext uppers HEENT: PERRL Cardiovascular:s1 s2 irt distant Lungs: reduced coarse Abdomen: Super morbidly obese.  BS none, no r/g Musculoskeletal: Massive lymphedema. Skin: Extensive wound to LLE that is very malodorous  LABS:  BMET  Recent Labs Lab 06/23/15 0514 06/23/15 2314 06/24/15 0547  NA 140 139 141  K 4.4 3.6 3.6  CL 108 108 107  CO2 17* 20* 20*  BUN 71* 59* 59*   CREATININE 2.91* 2.64* 2.62*  GLUCOSE 134* 290* 169*    Electrolytes  Recent Labs Lab 06/22/15 0507 06/23/15 0514 06/23/15 2314 06/24/15 0547  CALCIUM 8.0* 7.6* 7.3* 7.6*  MG 1.6* 1.6* 1.4*  --   PHOS 5.9* 4.8*  --   --     CBC  Recent Labs Lab 06/22/15 0507 06/22/15 1649 06/23/15 0514 06/24/15 0547  WBC 10.5  --  11.2* 17.0*  HGB 11.3* 11.6* 10.2* 9.1*  HCT 36.8 34.0* 33.0* 29.6*  PLT 413*  --  340 265    Coag's  Recent Labs Lab 06/22/15 0915 06/22/15 1521 06/22/15 2100  INR 3.34* 1.80* 1.43    Sepsis Markers  Recent Labs Lab 06/20/15 1711 06/22/15 0448  LATICACIDVEN 2.0 1.4    ABG  Recent Labs Lab 06/22/15 1649 06/22/15 2044 06/23/15 1021  PHART 7.348* 7.306* 7.321*  PCO2ART 39.2 38.5 35.7  PO2ART 561.0* 210* 181*    Liver Enzymes  Recent Labs Lab 06/20/15 1711 06/24/15 0547  AST 15 13*  ALT 15 14  ALKPHOS 69 58  BILITOT 0.6 0.4  ALBUMIN 2.9* 1.7*    Cardiac Enzymes No results for input(s): TROPONINI, PROBNP in the last 168 hours.  Glucose  Recent Labs Lab 06/22/15 0103 06/23/15 1407 06/23/15 1601 06/23/15 2008 06/24/15 0428 06/24/15 0549  GLUCAP 124* 118* 157* 131* 317* 154*    Imaging Dg Abd Portable 1v  06/23/2015  CLINICAL DATA:  45 year old female with a history of orogastric  tube placement EXAM: PORTABLE ABDOMEN - 1 VIEW COMPARISON:  06/22/2015, 05/31/2015, 05/28/2015 FINDINGS: Limited plain film demonstrating interval placement of gastric tube terminating in the left upper quadrant. The side port terminates near the GE junction, slightly below the diaphragm level. IMPRESSION: Interval placement of gastric tube terminating in the stomach left upper quadrant. Side port terminates near the GE junction, likely in the cardia. Signed, Yvone NeuJaime S. Loreta AveWagner, DO [facility] Electronically Signed   By: Gilmer MorJaime  Wagner D.O.   On: 06/23/2015 17:57     STUDIES:    CULTURES: Blood 05/05 > Wound 05/05 > [facility] wound gram ne rod>>>  ANTIBIOTICS: Vanc 05/05 > Amikacin 5/5>>> Clindamycin 05/05 > Unasyn 5/5>>>  SIGNIFICANT EVENTS: 05/05 > admitted with presumed necrotizing fasciitis of LLE, to OR 5/6- fib, rvr, lower pressors  LINES/TUBES: None  DISCUSSION: 45 y.o. super morbidly obese F admitted 05/05 with presumed nec fasc of LLE.  [facility].  ASSESSMENT / PLAN:  INFECTIOUS A:   Shock - presumed septic due to LLE necrotizing fasciitis.  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 P:   OR Monday ABX per ID No fevers Prognosis is poor with these organisms Follow gram neg from outside hospital  PULMONARY A: Acute resp failure Traumatic intubation IN OR OSA / OHS Int prominence / edema from fib rvr P:   pcxr reviewed, even goals, may need lasix I will consider placing a new A line Goal cpa 5 ps 5 Lower volume, control fib rvr pcxr in am   CARDIOVASCULAR A:  Shock - presumed septic due to LLE necrotizing fasciitis. Hx A.fib, dCHF. New fib rvr Rel AI P:  Levophed to MAP 60, hope to dc today Tele Empiric steroids to remain until off pressors No hep as for OR and high risk bleeding from wound post debridement  Ensure tsh - 8.8 see endocrine  RENAL A:   AoCKD. AG and NONAG ATN, ARF P:   Maintain even balance Chem in am  Get cvp- 13, see pcxr, may need lasix, await off pressors Add sodium acetate- consider dc  GASTROINTESTINAL A:   Super morbid obesity. GERD. Nutrition. P:   Pantoprazole. NPO midnight for OR feeds  HEMATOLOGIC A:   VTE Prophylaxis. P:  SCD's unablwe to fit around leg / Heparin. CBC in AM.  ENDOCRINE A:   Hypothyroidism. P:   Continue outpatient synthroid as IV, get tsh as outpt in 1 month May need increase synthroid in future cbg  NEUROLOGIC A:   Shock state enceph P:   Add fent ,m wua   Family  updated: None.  Interdisciplinary Family Meeting v Palliative Care Meeting:  Due by: 06/28/15.  CC time: 30 minutes.  Mcarthur Rossettianiel J. Tyson AliasFeinstein, MD, FACP Pgr: [phone] [place] [facility]

## 2015-06-24 NOTE — Progress Notes (Signed)
eLink Physician Progress Note and Electrolyte Replacement  Patient Name: Marcie MowersSharon Mistry DOB: 12/02/1970 MRN: [id number]  Date of Service  06/24/2015   HPI/Events of Note    Recent Labs Lab 06/20/15 1711 06/21/15 0601 06/22/15 0507 06/22/15 1649 06/23/15 0514 06/23/15 2314  NA 133* 133* 138 137 140 139  K 4.1 4.1 4.1 4.1 4.4 3.6  CL 96* 101 103  --  108 108  CO2 23 20* 19*  --  17* 20*  GLUCOSE 107* 98 143*  --  134* 290*  BUN 91* 92* 85*  --  71* 59*  CREATININE 3.88* 3.77* 3.64*  --  2.91* 2.64*  CALCIUM 8.7* 8.0* 8.0*  --  7.6* 7.3*  MG  --   --  1.6*  --  1.6* 1.4*  PHOS  --   --  5.9*  --  4.8*  --     Estimated Creatinine Clearance: 56.7 mL/min (by C-G formula based on Cr of 2.64).  Intake/Output      05/06 0701 - 05/07 0700   I.V. (mL/kg) 1951.8 (7.4)   IV Piggyback 404   Total Intake(mL/kg) 2355.8 (8.9)   Urine (mL/kg/hr) 1175 (0.2)   Total Output 1175   Net +1180.8        - I/O DETAILED x 24h    Total I/O In: 320 [I.V.:220; IV Piggyback:100] Out: 475 [Urine:475] - I/O THIS SHIFT    ASSESSMENT AKI - improving Low K Low mag  eICURN Interventions  Replete K and Mag   ASSESSMENT: MAJOR ELECTROLYTE      Dr. Kalman ShanMurali Addilynne Olheiser, M.D., [facility] Inc[facility].C.C.P Pulmonary and Critical Care Medicine Staff Physician [facility] [place] [facility] Pager: [phone], If no answer or between  15:00h - 7:00h: call [phone]  06/24/2015 12:57 AM

## 2015-06-24 NOTE — Progress Notes (Signed)
Pharmacy Antibiotic Note  Michele MowersSharon Weber is a 45 y.o. [facility] with LLE cellulitis and non-healing wounds. The patient was originally started on Vancomycin + Zosyn + Clindamycin. 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 [facility] are showing heavy growth of GNR. ID has been consulted and started the patient to Amikacin + Unasyn + Vancomycin + Clindamycin on 5/5. This was adjusted to Colistin + Unasyn + Zyvox on 5/6.   The patient is noted to have poor renal function but it is noted to be improving - SCr 2.62, CrCl~30 ml/min (normalized). Dosing weight is also an issue here with vast differences in IBW (45.5 kg), AdjBW (137 kg), and TBW (265 kg).  Colistin dosing in obesity is a gray area and it is also renally adjusted. The patient was loaded with the MAX dose of 300 mg (pt received during a previous admit) on 5/7 evening. Given more drug dosing research and evaluation of various nomograms utilized at other institutions - will transition the patient to 1.5 mg/kg q12h utilizing IBW starting on 5/8 AM.  This was discussed with several ID pharmacists Mayford Knife([place], [place], [place]) and several drug references ([address], [facility], [facility], [facility]) were viewed for this to be discussed and deemed an acceptable option for this patient.  Plan: 1. Adjust Colistin to 70 mg IV every 12 hours 2. Continue Unasyn to 3g IV every 6 hours 3. Continue Zyvox 600 mg IV every 12 hours per ID-MD 4. Will continue to follow renal function, culture results, LOT, and antibiotic de-escalation plans   Height: 5' (152.4 cm) Weight: (!) 585 lb (265.354 kg) IBW/kg (Calculated) : 45.5  Adjusted BW: 137 kg  Temp (24hrs), Avg:97.4 F (36.3 C), Min:97 F (36.1 C), Max:97.6 F (36.4 C)   Recent Labs Lab 06/20/15 1711 06/21/15 0601 06/21/15 1815 06/22/15 0448 06/22/15 0507 06/22/15 1521  06/23/15 0514 06/23/15 2314 06/24/15 0547  WBC 7.3 6.1  --   --  10.5  --  11.2*  --  17.0*  CREATININE 3.88* 3.77*  --   --  3.64*  --  2.91* 2.64* 2.62*  LATICACIDVEN 2.0  --   --  1.4  --   --   --   --   --   VANCORANDOM  --   --  20  --   --  13  --   --   --     Estimated Creatinine Clearance: 57.1 mL/min (by C-G formula based on Cr of 2.62).    No Known Allergies  Antimicrobials this admission: Zosyn 5/3 >> 5/5 Vancomycin 5/3 >> 5/6 * 5/4 VR = 20 mcg/mL (~24 hours post first 2gm dose) * 5/5 VR = 13 mcg/ml (~45.5 hours post first 2g dose), pt specific ke~0.02 (t 1/2~35 hours) Clinda 5/3 >> 5/6 Amikacin 5/5 >> 5/6 Unasyn 5/5 >> Zyvox 5/6 >> Colistin IV 5/6 >>  Dose adjustments this admission: n/a  Microbiology results: Cultures from last admit:  4/5 WCx + BCx (old cx) >> MDR Acinetabacter (pan-R except I-Tobra/Zosyn/CTX, S-Unasyn/Ceftaz/Imi) 4/9 BCx (old cx) >> MDR Acinetobacter (pan-R except I-Ceftaz/Unasyn) * Also noted to be R-Avycaz per discussion with ID (Cassie)  Cultures from [place]: 5/3 Blood x 2 - ngtd 5/3 MRSA PCR negative 5/3 WCx >> heavy growth GNR  This admit:  5/5 WCx >> GNR 5/5 BCx >>  Thank you for allowing pharmacy to be a  part of this patient's care.  Georgina Pillion, PharmD, BCPS Clinical Pharmacist Pager: [phone] 06/24/2015 10:30 PM

## 2015-06-24 NOTE — Progress Notes (Signed)
Subjective: Intubated but awake, co of swelling  Antibiotics:  Anti-infectives    Start     Dose/Rate Route Frequency Ordered Stop   06/25/15 0500  colistimethate (COLYMYCIN) 200 mg in sodium chloride 0.9 % 100 mL IVPB     200 mg 205.3 mL/hr over 30 Minutes Intravenous Every 36 hours 06/23/15 1535     06/24/15 2000  amikacin (AMIKIN) 1,000 mg in dextrose 5 % 100 mL IVPB  Status:  Discontinued     1,000 mg 104 mL/hr over 60 Minutes Intravenous Every 48 hours 06/22/15 1608 06/23/15 1417   06/23/15 1800  Ampicillin-Sulbactam (UNASYN) 3 g in sodium chloride 0.9 % 100 mL IVPB     3 g 100 mL/hr over 60 Minutes Intravenous Every 6 hours 06/23/15 1435     06/23/15 1800  linezolid (ZYVOX) tablet 600 mg     600 mg Per Tube Every 12 hours 06/23/15 1706     06/23/15 1700  colistimethate (COLYMYCIN) 300 mg in sodium chloride 0.9 % 100 mL IVPB     300 mg 208 mL/hr over 30 Minutes Intravenous  Once 06/23/15 1535 06/23/15 1746   06/23/15 1530  linezolid (ZYVOX) 100 MG/5ML suspension 600 mg  Status:  Discontinued     600 mg Oral Every 12 hours 06/23/15 1407 06/23/15 1419   06/23/15 1430  linezolid (ZYVOX) tablet 600 mg  Status:  Discontinued     600 mg Per Tube Every 12 hours 06/23/15 1419 06/23/15 1706   06/22/15 2000  amikacin (AMIKIN) 1,050 mg in dextrose 5 % 100 mL IVPB     1,050 mg 104.2 mL/hr over 60 Minutes Intravenous  Once 06/22/15 1608 06/22/15 2341   06/22/15 1900  vancomycin (VANCOCIN) 2,500 mg in sodium chloride 0.9 % 500 mL IVPB  Status:  Discontinued     2,500 mg 250 mL/hr over 120 Minutes Intravenous Every 48 hours 06/22/15 1837 06/23/15 1407   06/22/15 1800  Ampicillin-Sulbactam (UNASYN) 3 g in sodium chloride 0.9 % 100 mL IVPB  Status:  Discontinued     3 g 100 mL/hr over 60 Minutes Intravenous Every 8 hours 06/22/15 1608 06/23/15 1435   06/22/15 0900  [MAR Hold]  cefTAZidime (FORTAZ) 2 g in dextrose 5 % 50 mL IVPB  Status:  Discontinued     (MAR Hold since 06/22/15  1556)   2 g 100 mL/hr over 30 Minutes Intravenous Every 12 hours 06/22/15 0826 06/22/15 1609   06/22/15 0300  clindamycin (CLEOCIN) IVPB 900 mg  Status:  Discontinued     900 mg 100 mL/hr over 30 Minutes Intravenous Every 8 hours 06/22/15 0158 06/23/15 1407   06/22/15 0200  piperacillin-tazobactam (ZOSYN) IVPB 3.375 g  Status:  Discontinued     3.375 g 12.5 mL/hr over 240 Minutes Intravenous Every 8 hours 06/22/15 0158 06/22/15 0756      Medications: Scheduled Meds: . sodium chloride   Intravenous Once  . ampicillin-sulbactam (UNASYN) IV  3 g Intravenous Q6H  . antiseptic oral rinse  7 mL Mouth Rinse QID  . chlorhexidine gluconate (SAGE KIT)  15 mL Mouth Rinse BID  . [START ON 06/25/2015] colistimethate (COLISTIN) IV  200 mg Intravenous Q36H  . feeding supplement (VITAL HIGH PROTEIN)  1,000 mL Per Tube Q24H  . fentaNYL (SUBLIMAZE) injection  50 mcg Intravenous Once  . hydrocortisone sod succinate (SOLU-CORTEF) inj  50 mg Intravenous Q6H  . insulin aspart  1-3 Units Subcutaneous Q4H  . levothyroxine  25  mcg Intravenous Daily  . linezolid  600 mg Per Tube Q12H  . pantoprazole (PROTONIX) IV  40 mg Intravenous Q24H  . sodium chloride flush  10-40 mL Intracatheter Q12H   Continuous Infusions: . sodium chloride 10 mL/hr at 06/24/15 0750  . amiodarone 30 mg/hr (06/24/15 1036)  . fentaNYL infusion INTRAVENOUS 200 mcg/hr (06/24/15 1256)  . norepinephrine (LEVOPHED) Adult infusion 2 mcg/min (06/24/15 0600)  . sodium acetate 150 mEq in dextrose 5 % 1,000 mL infusion 50 mL/hr at 06/24/15 0905   PRN Meds:.sodium chloride, fentaNYL, sodium chloride flush    Objective: Weight change:   Intake/Output Summary (Last 24 hours) at 06/24/15 1600 Last data filed at 06/24/15 1400  Gross per 24 hour  Intake 5706.94 ml  Output   1225 ml  Net 4481.94 ml   Blood pressure 114/83, pulse 112, temperature 97 F (36.1 C), temperature source Oral, resp. rate 16, height 5' (1.524 m), weight 585 lb  (265.354 kg), last menstrual period 05/28/2015, SpO2 100 %. Temp:  [97 F (36.1 C)-97.9 F (36.6 C)] 97 F (36.1 C) (05/07 1530) Pulse Rate:  [87-137] 112 (05/07 1539) Resp:  [16-38] 16 (05/07 1400) BP: (88-126)/(57-102) 114/83 mmHg (05/07 1539) SpO2:  [100 %] 100 % (05/07 1539) Arterial Line BP: (87)/(65) 87/65 mmHg (05/06 1700) FiO2 (%):  [30 %] 30 % (05/07 1539)  Physical Exam: General:Intubated following commands writing sentences HEENT:EOMI CVS regular rate, normal r,  no murmur rubs or gallops Chest: rhonchi Abdomen: soft  Nondistended, Extremities left lower external knee with extensive dressing in place  Neuro: nonfocal  CBC:  CBC Latest Ref Rng 06/24/2015 06/23/2015 06/22/2015  WBC 4.0 - 10.5 K/uL 17.0(H) 11.2(H) -  Hemoglobin 12.0 - 15.0 g/dL 9.1(L) 10.2(L) 11.6(L)  Hematocrit 36.0 - 46.0 % 29.6(L) 33.0(L) 34.0(L)  Platelets 150 - 400 K/uL 265 340 -       BMET  Recent Labs  06/23/15 2314 06/24/15 0547  NA 139 141  K 3.6 3.6  CL 108 107  CO2 20* 20*  GLUCOSE 290* 169*  BUN 59* 59*  CREATININE 2.64* 2.62*  CALCIUM 7.3* 7.6*     Liver Panel   Recent Labs  06/24/15 0547  PROT 4.6*  ALBUMIN 1.7*  AST 13*  ALT 14  ALKPHOS 58  BILITOT 0.4       Sedimentation Rate No results for input(s): ESRSEDRATE in the last 72 hours. C-Reactive Protein No results for input(s): CRP in the last 72 hours.  Micro Results: Recent Results (from the past 720 hour(s))  Culture, blood (routine x 2)     Status: Abnormal   Collection Time: 05/27/15 12:01 PM  Result Value Ref Range Status   Specimen Description BLOOD CENTRAL LINE  Final   Special Requests BOTTLES DRAWN AEROBIC AND ANAEROBIC 10CC  Final   Culture  Setup Time   Final    GRAM NEGATIVE COCCOBACILLI IN BOTH AEROBIC AND ANAEROBIC BOTTLES CRITICAL RESULT CALLED TO, READ BACK BY AND VERIFIED WITH: TO BSHEPARD(RN) BY TCLEVELAND 05/28/2015 AT 2:52AM CORRECTED RESULTS CALLED TO: L WILSON,RN AT 8366 05/28/15  BY L BENFIELD PREVIOUSLY REPORTED AS GRAM POSITIVE COCCI    Culture (A)  Final    ACINETOBACTER CALCOACETICUS/BAUMANNII COMPLEX MULTI-DRUG RESISTANT ORGANISM SEE SEPARATE REPORT FOR COLISITIN, AMIKACIN, MINOCYCLINE AND TETRACYCLINE SENSITIVITIES Performed at [facility] SEE SEPARATE REPORT FOR AVYCAZ SUSCEPTIBILITY Performed at [place]    Report Status 06/06/2015 FINAL  Final   Organism ID, Bacteria ACINETOBACTER CALCOACETICUS/BAUMANNII COMPLEX  Final      Susceptibility   Acinetobacter calcoaceticus/baumannii complex - MIC*    CEFTAZIDIME 16 INTERMEDIATE Intermediate     CEFTRIAXONE >=64 RESISTANT Resistant     CIPROFLOXACIN >=4 RESISTANT Resistant     GENTAMICIN >=16 RESISTANT Resistant     IMIPENEM >=16 RESISTANT Resistant     PIP/TAZO >=128 RESISTANT Resistant     TRIMETH/SULFA >=320 RESISTANT Resistant     AMPICILLIN/SULBACTAM 16 INTERMEDIATE Intermediate     * ACINETOBACTER CALCOACETICUS/BAUMANNII COMPLEX  Culture, blood (routine x 2)     Status: Abnormal   Collection Time: 05/27/15 12:07 PM  Result Value Ref Range Status   Specimen Description BLOOD RIGHT HAND  Final   Special Requests BOTTLES DRAWN AEROBIC ONLY 5CC  Final   Culture  Setup Time   Final    GRAM NEGATIVE COCCOBACILLI AEROBIC BOTTLE ONLY CRITICAL RESULT CALLED TO, READ BACK BY AND VERIFIED WITH: TO BSHEPARD (RN) BY TCLEVELAND AT 2:54AM CORRECTED RESULTS CALLED TO: L WILSON,RN AT 0354 05/28/15 BY L BENFIELD PREVIOUSLY REPORTED AS GRAM POSITIVE COCCI    Culture (A)  Final    ACINETOBACTER CALCOACETICUS/BAUMANNII COMPLEX SUSCEPTIBILITIES PERFORMED ON PREVIOUS CULTURE WITHIN THE LAST 5 DAYS.    Report Status 05/31/2015 FINAL  Final  Culture, blood (Routine X 2) w Reflex to ID Panel     Status: None   Collection Time: 05/29/15  9:43 AM  Result Value Ref Range Status   Specimen Description BLOOD LEFT ARM  Final   Special Requests IN PEDIATRIC BOTTLE  4CC  Final   Culture NO GROWTH 5  DAYS  Final   Report Status 06/03/2015 FINAL  Final  Culture, blood (Routine X 2) w Reflex to ID Panel     Status: None   Collection Time: 05/29/15  9:50 AM  Result Value Ref Range Status   Specimen Description BLOOD LEFT ARM  Final   Special Requests IN PEDIATRIC BOTTLE  4CC  Final   Culture NO GROWTH 5 DAYS  Final   Report Status 06/03/2015 FINAL  Final  Culture, blood (Routine X 2) w Reflex to ID Panel     Status: None   Collection Time: 06/01/15  3:36 PM  Result Value Ref Range Status   Specimen Description BLOOD RIGHT HAND  Final   Special Requests IN PEDIATRIC BOTTLE 4CC  Final   Culture NO GROWTH 5 DAYS  Final   Report Status 06/06/2015 FINAL  Final  Culture, blood (Routine X 2) w Reflex to ID Panel     Status: None   Collection Time: 06/01/15  3:46 PM  Result Value Ref Range Status   Specimen Description BLOOD RIGHT ANTECUBITAL  Final   Special Requests IN PEDIATRIC BOTTLE 3CC  Final   Culture NO GROWTH 5 DAYS  Final   Report Status 06/06/2015 FINAL  Final  C difficile quick scan w PCR reflex     Status: None   Collection Time: 06/06/15 12:11 PM  Result Value Ref Range Status   C Diff antigen NEGATIVE NEGATIVE Final   C Diff toxin NEGATIVE NEGATIVE Final   C Diff interpretation Negative for toxigenic C. difficile  Final  Culture, blood (routine x 2)     Status: None (Preliminary result)   Collection Time: 06/20/15  5:11 PM  Result Value Ref Range Status   Specimen Description BLOOD RIGHT ASSIST CONTROL  Final   Special Requests BOTTLES DRAWN AEROBIC AND ANAEROBIC  5CC  Final   Culture NO GROWTH 4 DAYS  Final   Report Status PENDING  Incomplete  Culture, blood (routine x 2)     Status: None (Preliminary result)   Collection Time: 06/20/15  5:11 PM  Result Value Ref Range Status   Specimen Description BLOOD LEFT ASSIST CONTROL  Final   Special Requests BOTTLES DRAWN AEROBIC AND ANAEROBIC  5CC  Final   Culture NO GROWTH 4 DAYS  Final   Report Status PENDING  Incomplete    Wound culture     Status: None (Preliminary result)   Collection Time: 06/20/15  5:11 PM  Result Value Ref Range Status   Specimen Description WOUND  Final   Special Requests Normal  Final   Gram Stain   Final    FEW WBC SEEN MANY GRAM NEGATIVE RODS FEW GRAM POSITIVE COCCI    Culture   Final    HEAVY GROWTH GRAM NEGATIVE RODS TRYING TO ISOLATE MULTIPLE POSSIBLE PATHOGENS    Report Status PENDING  Incomplete  Surgical PCR screen     Status: None   Collection Time: 06/20/15 10:10 PM  Result Value Ref Range Status   MRSA, PCR NEGATIVE NEGATIVE Final   Staphylococcus aureus NEGATIVE NEGATIVE Final    Comment:        The Xpert SA Assay (FDA approved for NASAL specimens in patients over 62 years of age), is one component of a comprehensive surveillance program.  Test performance has been validated by [facility] for patients greater than or equal to 66 year old. It is not intended to diagnose infection nor to guide or monitor treatment.   Culture, blood (Routine X 2) w Reflex to ID Panel     Status: None (Preliminary result)   Collection Time: 06/22/15  2:35 AM  Result Value Ref Range Status   Specimen Description BLOOD RIGHT ANTECUBITAL  Final   Special Requests BOTTLES DRAWN AEROBIC ONLY 10CC  Final   Culture NO GROWTH 1 DAY  Final   Report Status PENDING  Incomplete  Wound culture     Status: None (Preliminary result)   Collection Time: 06/22/15  3:04 AM  Result Value Ref Range Status   Specimen Description WOUND  Final   Special Requests LEFT LEG  Final   Gram Stain PENDING  Incomplete   Culture   Final    ABUNDANT GRAM NEGATIVE RODS Performed at [facility]    Report Status PENDING  Incomplete  Culture, blood (Routine X 2) w Reflex to ID Panel     Status: None (Preliminary result)   Collection Time: 06/22/15  8:00 AM  Result Value Ref Range Status   Specimen Description BLOOD RIGHT ARM  Final   Special Requests BOTTLES DRAWN AEROBIC ONLY [place]  Final    Culture NO GROWTH 1 DAY  Final   Report Status PENDING  Incomplete  Wound culture     Status: None (Preliminary result)   Collection Time: 06/22/15  2:56 PM  Result Value Ref Range Status   Specimen Description WOUND LEFT LEG  Final   Special Requests NONE  Final   Gram Stain PENDING  Incomplete   Culture   Final    NO GROWTH 1 DAY Performed at [facility]    Report Status PENDING  Incomplete  Anaerobic culture     Status: None (Preliminary result)   Collection Time: 06/22/15  6:12 PM  Result Value Ref Range Status   Specimen Description WOUND LEFT LEG  Final   Special Requests PATIENT ON FOLLOWING UNASYN FORTAZ  CLEOSIN ZOSYN  Final   Gram Stain PENDING  Incomplete   Culture   Final    NO ANAEROBES ISOLATED; CULTURE IN PROGRESS FOR 5 DAYS Performed at [facility]    Report Status PENDING  Incomplete  Wound culture     Status: None (Preliminary result)   Collection Time: 06/22/15  6:12 PM  Result Value Ref Range Status   Specimen Description WOUND LEFT LEG  Final   Special Requests PATIENT ON FOLLOWING UNASYN FORTAZ CLEOCIN ZOSYN  Final   Gram Stain PENDING  Incomplete   Culture   Final    Culture reincubated for better growth Performed at [facility]    Report Status PENDING  Incomplete    Studies/Results: Dg Chest Port 1 View  06/24/2015  CLINICAL DATA:  Hypoxia EXAM: PORTABLE CHEST 1 VIEW COMPARISON:  Jun 22, 2015 FINDINGS: Endotracheal tube tip is 2.4 cm above the carina. Nasogastric tube tip and side port are below the diaphragm. Central catheter tip is in the superior vena cava. No pneumothorax. There is no edema or consolidation. There is cardiomegaly with pulmonary vascularity within normal limits. No adenopathy evident. IMPRESSION: Tube and catheter positions without pneumothorax. Stable cardiomegaly. No edema or consolidation. Electronically Signed   By: Lowella Grip III M.D.   On: 06/24/2015 07:55   Dg Chest Port 1 View  06/22/2015   CLINICAL DATA:  Hypoxia EXAM: PORTABLE CHEST 1 VIEW COMPARISON:  May 31, 2015 FINDINGS: Endotracheal tube tip is at the carina. Central catheter tip in superior vena cava near the cavoatrial junction. No pneumothorax. There is no edema or consolidation. Heart is enlarged with pulmonary vascularity within normal limits. No adenopathy. No bone lesions. IMPRESSION: Endotracheal tube tip is at the carina. Advise withdrawing approximately 3 cm. No edema or consolidation. No pneumothorax. There is cardiomegaly. Critical Value/emergent results were called by telephone at the time of interpretation on 06/22/2015 at 7:25 pm to Loma Boston, RN , who verbally acknowledged these results. Electronically Signed   By: Lowella Grip III M.D.   On: 06/22/2015 19:25   Dg Abd Portable 1v  06/23/2015  CLINICAL DATA:  45 year old female with a history of orogastric tube placement EXAM: PORTABLE ABDOMEN - 1 VIEW COMPARISON:  06/22/2015, 05/31/2015, 05/28/2015 FINDINGS: Limited plain film demonstrating interval placement of gastric tube terminating in the left upper quadrant. The side port terminates near the GE junction, slightly below the diaphragm level. IMPRESSION: Interval placement of gastric tube terminating in the stomach left upper quadrant. Side port terminates near the GE junction, likely in the cardia. Signed, Dulcy Fanny. Earleen Newport, DO [facility] Electronically Signed   By: Corrie Mckusick D.O.   On: 06/23/2015 17:57      Assessment/Plan:  INTERVAL HISTORY:   Pt sp extensive surgery by  Dr. 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  female with morbid obesity, CKD (not HD candidate) previous acinetobacter (S- colistin, amikacin. R- avycaz) bacteremia and wound Cx 05-2015 (souce wound?, treated with imipenem). 2/3 were sensitive to unasyn. By the time her sensitivities had returned, she was improved, and was able to be d/c. She was prev living  at SNF. She was adm to [facility] on 5-3 with cellulitis on her L thigh and worsening of her wounds. The wound worsened at her facility. She was started on vanco/zosyn. BCx ngtd, wound Cx heavy growth GNR., still not isolated due to mx organisms being present and need for re-incubation. Shewas in septic shock requiring levofed, coagulopathy, and was felt to have necrotizing fascitis. She was transferred to [facility] t for surgical eval, CCM services. She was started on vanco/clinda/zosyn.--> vanco/unasyn/amikacin now sp extensive debridement of necrotic tissue in the OR on 3 operating tables. She remanis of levophed but down from 15 to 5  #1 Necrotizing fasciitis: GNR abundant growing on one culture. Worried is is the acinetobacter  --continue colistin, zyvox and unasyn --greatly appreciate CCS close attention and she is to go back to OR it is my understanding likely tomorrow.  #2 MDR acenitobacter: see above going to colistin and unasyn  I spent greater than 35 minutes with the patient including greater than 50% of time in face to face counsel of the patients family and in coordination of their care with CCM, Pharmacy re MDR organism, necrotizing fascitis.  Dr Johnnye Sima is back tomorrow.    LOS: 2 days   Alcide Evener 06/24/2015, 4:00 PM

## 2015-06-25 ENCOUNTER — Inpatient Hospital Stay (HOSPITAL_COMMUNITY): Payer: Medicare Other

## 2015-06-25 ENCOUNTER — Encounter (HOSPITAL_COMMUNITY): Payer: Self-pay | Admitting: Surgery

## 2015-06-25 DIAGNOSIS — N179 Acute kidney failure, unspecified: Secondary | ICD-10-CM

## 2015-06-25 DIAGNOSIS — E43 Unspecified severe protein-calorie malnutrition: Secondary | ICD-10-CM

## 2015-06-25 DIAGNOSIS — J95821 Acute postprocedural respiratory failure: Secondary | ICD-10-CM

## 2015-06-25 LAB — WOUND CULTURE: Culture: NO GROWTH

## 2015-06-25 LAB — CULTURE, BLOOD (ROUTINE X 2)
Culture: NO GROWTH
Culture: NO GROWTH

## 2015-06-25 LAB — COMPREHENSIVE METABOLIC PANEL
ALK PHOS: 68 U/L (ref 38–126)
ALT: 15 U/L (ref 14–54)
AST: 9 U/L — ABNORMAL LOW (ref 15–41)
Albumin: 1.8 g/dL — ABNORMAL LOW (ref 3.5–5.0)
Anion gap: 14 (ref 5–15)
BILIRUBIN TOTAL: 0.4 mg/dL (ref 0.3–1.2)
BUN: 51 mg/dL — ABNORMAL HIGH (ref 6–20)
CALCIUM: 7.7 mg/dL — AB (ref 8.9–10.3)
CO2: 23 mmol/L (ref 22–32)
CREATININE: 2.14 mg/dL — AB (ref 0.44–1.00)
Chloride: 105 mmol/L (ref 101–111)
GFR, EST AFRICAN AMERICAN: 31 mL/min — AB (ref >60)
GFR, EST NON AFRICAN AMERICAN: 27 mL/min — AB (ref >60)
Glucose, Bld: 140 mg/dL — ABNORMAL HIGH (ref 65–99)
Potassium: 3.4 mmol/L — ABNORMAL LOW (ref 3.5–5.1)
Sodium: 142 mmol/L (ref 135–145)
Total Protein: 4.9 g/dL — ABNORMAL LOW (ref 6.5–8.1)

## 2015-06-25 LAB — CBC WITH DIFFERENTIAL/PLATELET
BASOS PCT: 1 %
Basophils Absolute: 0.2 10*3/uL — ABNORMAL HIGH (ref 0.0–0.1)
EOS ABS: 0.4 10*3/uL (ref 0.0–0.7)
Eosinophils Relative: 2 %
HCT: 27.2 % — ABNORMAL LOW (ref 36.0–46.0)
HEMOGLOBIN: 8.5 g/dL — AB (ref 12.0–15.0)
Lymphocytes Relative: 6 %
Lymphs Abs: 1.2 10*3/uL (ref 0.7–4.0)
MCH: 30.6 pg (ref 26.0–34.0)
MCHC: 31.3 g/dL (ref 30.0–36.0)
MCV: 97.8 fL (ref 78.0–100.0)
MONO ABS: 1.2 10*3/uL — AB (ref 0.1–1.0)
Monocytes Relative: 6 %
NEUTROS PCT: 85 %
Neutro Abs: 17.6 10*3/uL — ABNORMAL HIGH (ref 1.7–7.7)
Platelets: 261 10*3/uL (ref 150–400)
RBC: 2.78 MIL/uL — ABNORMAL LOW (ref 3.87–5.11)
RDW: 18.1 % — AB (ref 11.5–15.5)
WBC: 20.6 10*3/uL — AB (ref 4.0–10.5)

## 2015-06-25 LAB — GLUCOSE, CAPILLARY
GLUCOSE-CAPILLARY: 123 mg/dL — AB (ref 65–99)
GLUCOSE-CAPILLARY: 126 mg/dL — AB (ref 65–99)
GLUCOSE-CAPILLARY: 102 mg/dL — AB (ref 65–99)
Glucose-Capillary: 114 mg/dL — ABNORMAL HIGH (ref 65–99)
Glucose-Capillary: 117 mg/dL — ABNORMAL HIGH (ref 65–99)
Glucose-Capillary: 156 mg/dL — ABNORMAL HIGH (ref 65–99)

## 2015-06-25 LAB — HEPARIN LEVEL (UNFRACTIONATED): HEPARIN UNFRACTIONATED: 1.28 [IU]/mL — AB (ref 0.30–0.70)

## 2015-06-25 MED ORDER — HEPARIN BOLUS VIA INFUSION
5000.0000 [IU] | Freq: Once | INTRAVENOUS | Status: AC
  Administered 2015-06-25: 5000 [IU] via INTRAVENOUS
  Filled 2015-06-25: qty 5000

## 2015-06-25 MED ORDER — HEPARIN (PORCINE) IN NACL 100-0.45 UNIT/ML-% IJ SOLN
2400.0000 [IU]/h | INTRAMUSCULAR | Status: DC
  Administered 2015-06-25 (×2): 2400 [IU]/h via INTRAVENOUS
  Filled 2015-06-25 (×3): qty 250

## 2015-06-25 MED ORDER — LEVOTHYROXINE SODIUM 50 MCG PO TABS
50.0000 ug | ORAL_TABLET | Freq: Every day | ORAL | Status: DC
  Administered 2015-06-25 – 2015-06-29 (×5): 50 ug via ORAL
  Filled 2015-06-25 (×5): qty 1

## 2015-06-25 MED ORDER — HEPARIN (PORCINE) IN NACL 100-0.45 UNIT/ML-% IJ SOLN
1800.0000 [IU]/h | INTRAMUSCULAR | Status: DC
  Administered 2015-06-25: 2100 [IU]/h via INTRAVENOUS
  Filled 2015-06-25 (×2): qty 250

## 2015-06-25 MED ORDER — POTASSIUM CHLORIDE 10 MEQ/50ML IV SOLN
10.0000 meq | INTRAVENOUS | Status: AC
  Administered 2015-06-25 (×2): 10 meq via INTRAVENOUS
  Filled 2015-06-25 (×2): qty 50

## 2015-06-25 MED ORDER — FENTANYL CITRATE (PF) 100 MCG/2ML IJ SOLN
25.0000 ug | INTRAMUSCULAR | Status: DC | PRN
  Administered 2015-06-25 – 2015-06-28 (×5): 100 ug via INTRAVENOUS
  Administered 2015-06-28: 50 ug via INTRAVENOUS
  Administered 2015-06-28 – 2015-06-29 (×2): 100 ug via INTRAVENOUS
  Filled 2015-06-25 (×8): qty 2

## 2015-06-25 MED ORDER — WHITE PETROLATUM GEL
Status: AC
  Administered 2015-06-25: 0.2
  Filled 2015-06-25: qty 1

## 2015-06-25 MED ORDER — HYDROCORTISONE NA SUCCINATE PF 100 MG IJ SOLR
50.0000 mg | Freq: Three times a day (TID) | INTRAMUSCULAR | Status: DC
  Administered 2015-06-25 – 2015-06-26 (×3): 50 mg via INTRAVENOUS
  Filled 2015-06-25 (×3): qty 1

## 2015-06-25 NOTE — Procedures (Signed)
Extubation Procedure Note  Patient Details:   Name: Michele MowersSharon Weber DOB: January 06, 1971 MRN: [id number]   Airway Documentation:     Evaluation  O2 sats: stable throughout Complications: No apparent complications Patient did tolerate procedure well. Bilateral Breath Sounds: Diminished, Clear   Yes   Patient extubated to 2 LNC at this time. Positive cuff leak. Patient was able to vocalize and clear secretions. RT will continue to monitor.  Lurlean LeydenDick, Tashon Capp Bailey 06/25/2015, 2:41 PM

## 2015-06-25 NOTE — Progress Notes (Signed)
ANTICOAGULATION CONSULT NOTE - Follow up Consult  Pharmacy Consult:  Heparin Indication: atrial fibrillation  No Known Allergies  Patient Measurements: Height: 5' (152.4 cm) Weight: (!) 593 lb (268.983 kg) IBW/kg (Calculated) : 45.5 Heparin Dosing Weight: 122 kg  Vital Signs: Temp: 97.8 F (36.6 C) (05/08 1938) Temp Source: Oral (05/08 1938) BP: 93/52 mmHg (05/08 2000) Pulse Rate: 94 (05/08 1800)  Labs:  Recent Labs  06/22/15 2100  06/23/15 0514 06/23/15 2314 06/24/15 0547 06/25/15 0545 06/25/15 1857 06/25/15 1957  HGB  --   < > 10.2*  --  9.1* 8.5*  --   --   HCT  --   --  33.0*  --  29.6* 27.2*  --   --   PLT  --   --  340  --  265 261  --   --   LABPROT 17.5*  --   --   --   --   --   --   --   INR 1.43  --   --   --   --   --   --   --   HEPARINUNFRC  --   --   --   --   --   --  >2.20* 1.28*  CREATININE  --   < > 2.91* 2.64* 2.62* 2.14*  --   --   < > = values in this interval not displayed.  Estimated Creatinine Clearance: 70.7 mL/min (by C-G formula based on Cr of 2.14).   Medical History: Past Medical History  Diagnosis Date  . Morbid obesity (HCC)   . Lymphedema of both lower extremities   . Diastolic CHF (HCC)   . Vitamin D deficiency   . Atrial fibrillation (HCC)   . Vitamin D deficiency   . Sepsis (HCC) 05/2015  . Anemia     unspecified  . Cellulitis and abscess     bilateral legs    Assessment: 44 YOF with history of Afib on Coumadin PTA.  Coumadin was reversed for possible surgery, which is now deferred.  Pharmacy consulted to initiate IV heparin bridge. Started on 2,400 units/hr because this was last known rate last time when she was on heparin.  HL first came back > 2.2. Level drawn by day shift RN most likely from PICC line which is where the heparin is infusing as well. Talked with night shift RN and will recollect heparin level after holding heparin gtt for a few minutes to verify correct level.  Now HL still came back elevated but  lower at 1.28. Hgb low at 8.5, plts wnl. No issues reported per RN.  Goal of Therapy:  Heparin level 0.3-0.7 units/ml Monitor platelets by anticoagulation protocol: Yes  Plan:  Hold heparin gtt for 1 hr, then decrease heparin gtt to 2100 units/hr Check 6 hr HL Monitor daily HL, CBC, s/s of bleed  Enzo BiNathan Melton Walls, PharmD, [facility]red [facility] Baldwin ParkBCPS Clinical Pharmacist Pager [phone] 06/25/2015 8:55 PM

## 2015-06-25 NOTE — Progress Notes (Signed)
INFECTIOUS DISEASE PROGRESS NOTE  ID: Michele Weber is a 45 y.o. 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[place])   OSA (obstructive sleep apnea)   Obesity hypoventilation syndrome (HCC)   Chronic kidney disease (CKD), stage IV (severe) (HCC)   Endotracheally intubated   Encounter for imaging study to confirm orogastric (OG) tube placement  Subjective: Awake, on vent On levo  Abtx:  Anti-infectives    Start     Dose/Rate Route Frequency Ordered Stop   06/25/15 0500  colistimethate (COLYMYCIN) 200 mg in sodium chloride 0.9 % 100 mL IVPB  Status:  Discontinued     200 mg 205.3 mL/hr over 30 Minutes Intravenous Every 36 hours 06/23/15 1535 06/24/15 2236   06/25/15 0500  colistimethate (COLYMYCIN) 70 mg in sodium chloride 0.9 % 100 mL IVPB     70 mg 201.9 mL/hr over 30 Minutes Intravenous Every 12 hours 06/24/15 2236     06/24/15 2000  amikacin (AMIKIN) 1,000 mg in dextrose 5 % 100 mL IVPB  Status:  Discontinued     1,000 mg 104 mL/hr over 60 Minutes Intravenous Every 48 hours 06/22/15 1608 06/23/15 1417   06/23/15 1800  Ampicillin-Sulbactam (UNASYN) 3 g in sodium chloride 0.9 % 100 mL IVPB     3 g 100 mL/hr over 60 Minutes Intravenous Every 6 hours 06/23/15 1435     06/23/15 1800  linezolid (ZYVOX) tablet 600 mg     600 mg Per Tube Every 12 hours 06/23/15 1706     06/23/15 1700  colistimethate (COLYMYCIN) 300 mg in sodium chloride 0.9 % 100 mL IVPB     300 mg 208 mL/hr over 30 Minutes Intravenous  Once 06/23/15 1535 06/23/15 1746   06/23/15 1530  linezolid (ZYVOX) 100 MG/5ML suspension 600 mg  Status:  Discontinued     600 mg Oral Every 12 hours 06/23/15 1407 06/23/15 1419   06/23/15 1430  linezolid (ZYVOX) tablet 600 mg  Status:  Discontinued     600 mg Per Tube Every 12 hours 06/23/15 1419 06/23/15 1706   06/22/15 2000  amikacin (AMIKIN) 1,050 mg in dextrose 5 % 100 mL IVPB     1,050 mg 104.2 mL/hr over 60 Minutes Intravenous  Once 06/22/15 1608  06/22/15 2341   06/22/15 1900  vancomycin (VANCOCIN) 2,500 mg in sodium chloride 0.9 % 500 mL IVPB  Status:  Discontinued     2,500 mg 250 mL/hr over 120 Minutes Intravenous Every 48 hours 06/22/15 1837 06/23/15 1407   06/22/15 1800  Ampicillin-Sulbactam (UNASYN) 3 g in sodium chloride 0.9 % 100 mL IVPB  Status:  Discontinued     3 g 100 mL/hr over 60 Minutes Intravenous Every 8 hours 06/22/15 1608 06/23/15 1435   06/22/15 0900  [MAR Hold]  cefTAZidime (FORTAZ) 2 g in dextrose 5 % 50 mL IVPB  Status:  Discontinued     (MAR Hold since 06/22/15 1556)   2 g 100 mL/hr over 30 Minutes Intravenous Every 12 hours 06/22/15 0826 06/22/15 1609   06/22/15 0300  clindamycin (CLEOCIN) IVPB 900 mg  Status:  Discontinued     900 mg 100 mL/hr over 30 Minutes Intravenous Every 8 hours 06/22/15 0158 06/23/15 1407   06/22/15 0200  piperacillin-tazobactam (ZOSYN) IVPB 3.375 g  Status:  Discontinued     3.375 g 12.5 mL/hr over 240 Minutes Intravenous Every 8 hours 06/22/15 0158 06/22/15 0756      Medications:  Scheduled: . ampicillin-sulbactam (UNASYN) IV  3 g Intravenous Q6H  . antiseptic oral rinse  7 mL Mouth Rinse QID  . chlorhexidine gluconate (SAGE KIT)  15 mL Mouth Rinse BID  . colistimethate (COLISTIN) IV  70 mg Intravenous Q12H  . feeding supplement (VITAL HIGH PROTEIN)  1,000 mL Per Tube Q24H  . fentaNYL (SUBLIMAZE) injection  50 mcg Intravenous Once  . hydrocortisone sod succinate (SOLU-CORTEF) inj  50 mg Intravenous Q8H  . insulin aspart  1-3 Units Subcutaneous Q4H  . levothyroxine  50 mcg Oral QAC breakfast  . linezolid  600 mg Per Tube Q12H  . pantoprazole (PROTONIX) IV  40 mg Intravenous Q24H  . potassium chloride  10 mEq Intravenous Q1 Hr x 2  . sodium chloride flush  10-40 mL Intracatheter Q12H    Objective: Vital signs in last 24 hours: Temp:  [97 F (36.1 C)-97.6 F (36.4 C)] 97.5 F (36.4 C) (05/08 0727) Pulse Rate:  [87-122] 100 (05/08 0900) Resp:  [12-22] 14 (05/08  0900) BP: (82-118)/(30-83) 112/77 mmHg (05/08 0900) SpO2:  [99 %-100 %] 100 % (05/08 0900) FiO2 (%):  [30 %] 30 % (05/08 0747) Weight:  [268.983 kg (593 lb)] 268.983 kg (593 lb) (05/08 0500)   General appearance: alert and no distress Resp: clear to auscultation bilaterally Cardio: regular rate and rhythm GI: normal findings: bowel sounds normal and soft, non-tender Extremities: wounds on BLE are dressed.   Lab Results  Recent Labs  06/24/15 0547 06/25/15 0545  WBC 17.0* 20.6*  HGB 9.1* 8.5*  HCT 29.6* 27.2*  NA 141 142  K 3.6 3.4*  CL 107 105  CO2 20* 23  BUN 59* 51*  CREATININE 2.62* 2.14*   Liver Panel  Recent Labs  06/24/15 0547 06/25/15 0545  PROT 4.6* 4.9*  ALBUMIN 1.7* 1.8*  AST 13* 9*  ALT 14 15  ALKPHOS 58 68  BILITOT 0.4 0.4   Sedimentation Rate No results for input(s): ESRSEDRATE in the last 72 hours. C-Reactive Protein No results for input(s): CRP in the last 72 hours.  Microbiology: Recent Results (from the past 240 hour(s))  Culture, blood (routine x 2)     Status: None (Preliminary result)   Collection Time: 06/20/15  5:11 PM  Result Value Ref Range Status   Specimen Description BLOOD RIGHT ASSIST CONTROL  Final   Special Requests BOTTLES DRAWN AEROBIC AND ANAEROBIC  5CC  Final   Culture NO GROWTH 4 DAYS  Final   Report Status PENDING  Incomplete  Culture, blood (routine x 2)     Status: None (Preliminary result)   Collection Time: 06/20/15  5:11 PM  Result Value Ref Range Status   Specimen Description BLOOD LEFT ASSIST CONTROL  Final   Special Requests BOTTLES DRAWN AEROBIC AND ANAEROBIC  5CC  Final   Culture NO GROWTH 4 DAYS  Final   Report Status PENDING  Incomplete  Wound culture     Status: None (Preliminary result)   Collection Time: 06/20/15  5:11 PM  Result Value Ref Range Status   Specimen Description WOUND  Final   Special Requests Normal  Final   Gram Stain   Final    FEW WBC SEEN MANY GRAM NEGATIVE RODS FEW GRAM  POSITIVE COCCI    Culture   Final    HEAVY GROWTH GRAM NEGATIVE RODS TRYING TO ISOLATE MULTIPLE POSSIBLE PATHOGENS    Report Status PENDING  Incomplete  Surgical PCR screen     Status: None   Collection Time: 06/20/15 10:10 PM  Result Value Ref Range Status   MRSA, PCR NEGATIVE NEGATIVE Final   Staphylococcus aureus NEGATIVE NEGATIVE Final    Comment:        The Xpert SA Assay (FDA approved for NASAL specimens in patients over 91 years of age), is one component of a comprehensive surveillance program.  Test performance has been validated by [facility] for patients greater than or equal to 95 year old. It is not intended to diagnose infection nor to guide or monitor treatment.   Culture, blood (Routine X 2) w Reflex to ID Panel     Status: None (Preliminary result)   Collection Time: 06/22/15  2:35 AM  Result Value Ref Range Status   Specimen Description BLOOD RIGHT ANTECUBITAL  Final   Special Requests BOTTLES DRAWN AEROBIC ONLY 10CC  Final   Culture NO GROWTH 2 DAYS  Final   Report Status PENDING  Incomplete  Wound culture     Status: None (Preliminary result)   Collection Time: 06/22/15  3:04 AM  Result Value Ref Range Status   Specimen Description WOUND  Final   Special Requests LEFT LEG  Final   Gram Stain   Final    FEW WBC PRESENT, PREDOMINANTLY PMN NO SQUAMOUS EPITHELIAL CELLS SEEN MODERATE GRAM NEGATIVE COCCOBACILLI Performed at [facility]    Culture   Final    ABUNDANT GRAM NEGATIVE RODS Performed at [facility]    Report Status PENDING  Incomplete  Culture, blood (Routine X 2) w Reflex to ID Panel     Status: None (Preliminary result)   Collection Time: 06/22/15  8:00 AM  Result Value Ref Range Status   Specimen Description BLOOD RIGHT ARM  Final   Special Requests BOTTLES DRAWN AEROBIC ONLY [place]  Final   Culture NO GROWTH 2 DAYS  Final   Report Status PENDING  Incomplete  Wound culture     Status: None   Collection Time: 06/22/15   2:56 PM  Result Value Ref Range Status   Specimen Description WOUND LEFT LEG  Final   Special Requests NONE  Final   Gram Stain   Final    ABUNDANT WBC PRESENT, PREDOMINANTLY PMN NO SQUAMOUS EPITHELIAL CELLS SEEN NO ORGANISMS SEEN Performed at [facility]    Culture   Final    NO GROWTH 2 DAYS Performed at [facility]    Report Status 06/25/2015 FINAL  Final  Anaerobic culture     Status: None (Preliminary result)   Collection Time: 06/22/15  6:12 PM  Result Value Ref Range Status   Specimen Description WOUND LEFT LEG  Final   Special Requests PATIENT ON FOLLOWING UNASYN FORTAZ CLEOSIN ZOSYN  Final   Gram Stain   Final    RARE WBC PRESENT, PREDOMINANTLY PMN RARE SQUAMOUS EPITHELIAL CELLS PRESENT ABUNDANT GRAM NEGATIVE RODS Performed at [facility]    Culture   Final    NO ANAEROBES ISOLATED; CULTURE IN PROGRESS FOR 5 DAYS Performed at [facility]    Report Status PENDING  Incomplete  Wound culture     Status: None   Collection Time: 06/22/15  6:12 PM  Result Value Ref Range Status   Specimen Description WOUND LEFT LEG  Final   Special Requests PATIENT ON FOLLOWING UNASYN FORTAZ CLEOCIN ZOSYN  Final   Gram Stain   Final    RARE WBC PRESENT, PREDOMINANTLY PMN RARE SQUAMOUS EPITHELIAL CELLS PRESENT ABUNDANT GRAM NEGATIVE RODS Performed at [facility]  Culture   Final    MULTIPLE ORGANISMS PRESENT, NONE PREDOMINANT Note: NO STAPHYLOCOCCUS AUREUS ISOLATED NO GROUP A STREP (S.PYOGENES) ISOLATED Performed at [facility]    Report Status 06/25/2015 FINAL  Final    Studies/Results: Dg Chest Port 1 View  06/25/2015  CLINICAL DATA:  Pulmonary edema, shortness of breath, intubated patient EXAM: PORTABLE CHEST 1 VIEW COMPARISON:  Portable chest x-ray of Jun 24, 2015 FINDINGS: The lungs are borderline hypoinflated. The interstitial markings are less prominent today. There is no alveolar infiltrate, pleural effusion, or  pneumothorax. The cardiac silhouette remains enlarged. The pulmonary vascularity remains prominent centrally. The endotracheal tube tip lies 3.6 cm above the carina. The esophagogastric tube tip projects below the inferior margin of the image. The right internal jugular venous catheter tip projects over the posterior aspect of the right second rib. IMPRESSION: Cardiomegaly and mild central pulmonary vascular congestion with decreased pulmonary interstitial edema. The right internal jugular venous catheter has apparently been partially withdrawn and now its tip projects over the inferior aspect of the inferior vena cava at the level of the posterior second rib. The other support tubes are in stable position. Electronically Signed   By: David  Martinique M.D.   On: 06/25/2015 07:25   Dg Chest Port 1 View  06/24/2015  CLINICAL DATA:  Hypoxia EXAM: PORTABLE CHEST 1 VIEW COMPARISON:  Jun 22, 2015 FINDINGS: Endotracheal tube tip is 2.4 cm above the carina. Nasogastric tube tip and side port are below the diaphragm. Central catheter tip is in the superior vena cava. No pneumothorax. There is no edema or consolidation. There is cardiomegaly with pulmonary vascularity within normal limits. No adenopathy evident. IMPRESSION: Tube and catheter positions without pneumothorax. Stable cardiomegaly. No edema or consolidation. Electronically Signed   By: Lowella Grip III M.D.   On: 06/24/2015 07:55   Dg Abd Portable 1v  06/23/2015  CLINICAL DATA:  45 year old female with a history of orogastric tube placement EXAM: PORTABLE ABDOMEN - 1 VIEW COMPARISON:  06/22/2015, 05/31/2015, 05/28/2015 FINDINGS: Limited plain film demonstrating interval placement of gastric tube terminating in the left upper quadrant. The side port terminates near the GE junction, slightly below the diaphragm level. IMPRESSION: Interval placement of gastric tube terminating in the stomach left upper quadrant. Side port terminates near the GE junction, likely  in the cardia. Signed, Dulcy Fanny. Earleen Newport, DO [facility] Electronically Signed   By: Corrie Mckusick D.O.   On: 06/23/2015 17:57     Assessment/Plan: LLE Necrotizing Fascitis Hx of Acinetobacter bacteremia (05-2015) Morbid Obesity CRI Severe protein calorie malnutrition  Wound Cx 5-3 and 5-5 are growing GNR.  Cr worsening, as would be expected with colisitin, and her prev doses of amikacin.  Await her Cx results, hopefully can narrow atbc Consider stopping zyvox soon.  Possible return to OR nutrition Antbx day 3: Zyvox, unasyn, colistin         Bobby Rumpf Infectious Diseases (pager) [phone] [email or web address] 06/25/2015, 9:25 AM  LOS: 3 days

## 2015-06-25 NOTE — Progress Notes (Signed)
ANTICOAGULATION CONSULT NOTE - Initial Consult  Pharmacy Consult:  Heparin Indication: atrial fibrillation  No Known Allergies  Patient Measurements: Height: 5' (152.4 cm) Weight: (!) 593 lb (268.983 kg) IBW/kg (Calculated) : 45.5 Heparin Dosing Weight: 122 kg  Vital Signs: Temp: 97.5 F (36.4 C) (05/08 0727) Temp Source: Oral (05/08 0727) BP: 87/69 mmHg (05/08 1137) Pulse Rate: 100 (05/08 0900)  Labs:  Recent Labs  06/22/15 1521  06/22/15 2100  06/23/15 0514 06/23/15 2314 06/24/15 0547 06/25/15 0545  HGB  --   < >  --   --  10.2*  --  9.1* 8.5*  HCT  --   < >  --   --  33.0*  --  29.6* 27.2*  PLT  --   --   --   --  340  --  265 261  LABPROT 20.9*  --  17.5*  --   --   --   --   --   INR 1.80*  --  1.43  --   --   --   --   --   CREATININE  --   --   --   < > 2.91* 2.64* 2.62* 2.14*  < > = values in this interval not displayed.  Estimated Creatinine Clearance: 70.7 mL/min (by C-G formula based on Cr of 2.14).   Medical History: Past Medical History  Diagnosis Date  . Morbid obesity (HCC)   . Lymphedema of both lower extremities   . Diastolic CHF (HCC)   . Vitamin D deficiency   . Atrial fibrillation (HCC)   . Vitamin D deficiency   . Sepsis (HCC) 05/2015  . Anemia     unspecified  . Cellulitis and abscess     bilateral legs      Assessment: 44 YOF with history of Afib on Coumadin PTA.  Coumadin was reversed for possible surgery, which is now deferred.  Pharmacy consulted to initiate IV heparin bridge.  Last INR was 1.43; no bleeding reported.  Per patient's history in 2015 admission, patient was sub-therapeutic on heparin 2000 units/hr and heparin rate was increased to 2400 units/hr (no level with this rate).    Goal of Therapy:  Heparin level 0.3-0.7 units/ml Monitor platelets by anticoagulation protocol: Yes    Plan:  - Heparin 5000 units IV bolus x 1, then - Heparin gtt at 2400 units/hr - Check 6 hr HL - Daily HL / CBC - Monitor closely for  s/sx of bleeding    Halton Neas D. Laney Potashang, PharmD, BCPS Pager:  [phone] 06/25/2015, 11:49 AM

## 2015-06-25 NOTE — Progress Notes (Signed)
PULMONARY / CRITICAL CARE MEDICINE   Name: Michele Weber MRN: [id number] DOB: [birth year]    ADMISSION DATE:  06/22/2015 CONSULTATION DATE:  06/22/15  REFERRING MD:  TRH at [facility]  CHIEF COMPLAINT:  Cellulitis LLE  HISTORY OF PRESENT ILLNESS:  Michele Weber is a 45 y.o. female with PMH as outlined below including super morbid obesity.  [facility] for gram negative bacteremia (acinetobacter) and was discharged to rehab facility.  She then was [facility] on [facility]15 for cellulitis of the left lower extremity / non-healing wounds.  At [facility] she was evaluated by surgery who recommended surgical debridement; however, anesthesia services at [facility] felt that she would be better served at tertiary care center. She was subsequently [facility] for further evaluation and management including probable surgical I&D.  SUBJECTIVE:  Remains critically ill, intubated Fib rvr on amio gtt Levo low dose  VITAL SIGNS: BP 118/54 mmHg  Pulse 97  Temp(Src) 97.5 F (36.4 C) (Oral)  Resp 12  Ht 5' (1.524 m)  Wt 593 lb (268.983 kg)  BMI 115.81 kg/m2  SpO2 100%  LMP 05/28/2015  HEMODYNAMICS: CVP:  [1 mmHg-9 mmHg] 1 mmHg  VENTILATOR SETTINGS: Vent Mode:  [-] CPAP;PSV FiO2 (%):  [30 %] 30 % Set Rate:  [16 bmp] 16 bmp Vt Set:  [550 mL] 550 mL PEEP:  [5 cmH20] 5 cmH20 Pressure Support:  [5 cmH20-10 cmH20] 10 cmH20 Plateau Pressure:  [21 cmH20-22 cmH20] 21 cmH20  INTAKE / OUTPUT: I/O last 3 completed shifts: In: 7924.5 [I.V.:5433.9; NG/GT:1790.7; IV Piggyback:700] Out: 2075 [Urine:2075]   PHYSICAL EXAMINATION: General: Super morbid obese female Neuro: Alert, appropriate, moves all ext uppers HEENT: PERRL Cardiovascular:s1 s2 irt distant Lungs: reduced BL Abdomen: Super morbidly obese.  BS none, no r/g Musculoskeletal: Massive lymphedema. Skin: Extensive wound to LLE that is very malodorous  LABS:  BMET  Recent Labs Lab 06/23/15 2314 06/24/15 0547  06/25/15 0545  NA 139 141 142  K 3.6 3.6 3.4*  CL 108 107 105  CO2 20* 20* 23  BUN 59* 59* 51*  CREATININE 2.64* 2.62* 2.14*  GLUCOSE 290* 169* 140*    Electrolytes  Recent Labs Lab 06/22/15 0507 06/23/15 0514 06/23/15 2314 06/24/15 0547 06/25/15 0545  CALCIUM 8.0* 7.6* 7.3* 7.6* 7.7*  MG 1.6* 1.6* 1.4*  --   --   PHOS 5.9* 4.8*  --   --   --     CBC  Recent Labs Lab 06/23/15 0514 06/24/15 0547 06/25/15 0545  WBC 11.2* 17.0* 20.6*  HGB 10.2* 9.1* 8.5*  HCT 33.0* 29.6* 27.2*  PLT 340 265 261    Coag's  Recent Labs Lab 06/22/15 0915 06/22/15 1521 06/22/15 2100  INR 3.34* 1.80* 1.43    Sepsis Markers  Recent Labs Lab 06/20/15 1711 06/22/15 0448  LATICACIDVEN 2.0 1.4    ABG  Recent Labs Lab 06/22/15 1649 06/22/15 2044 06/23/15 1021  PHART 7.348* 7.306* 7.321*  PCO2ART 39.2 38.5 35.7  PO2ART 561.0* 210* 181*    Liver Enzymes  Recent Labs Lab 06/20/15 1711 06/24/15 0547 06/25/15 0545  AST 15 13* 9*  ALT 15 14 15   ALKPHOS 69 58 68  BILITOT 0.6 0.4 0.4  ALBUMIN 2.9* 1.7* 1.8*    Cardiac Enzymes No results for input(s): TROPONINI, PROBNP in the last 168 hours.  Glucose  Recent Labs Lab 06/24/15 1229 06/24/15 1533 06/24/15 2002 06/25/15 06/25/15 0445 06/25/15 0726  GLUCAP 135* 130* 152* 156* 114* 123*  Imaging Dg Chest Port 1 View  06/25/2015  CLINICAL DATA:  Pulmonary edema, shortness of breath, intubated patient EXAM: PORTABLE CHEST 1 VIEW COMPARISON:  Portable chest x-ray of Jun 24, 2015 FINDINGS: The lungs are borderline hypoinflated. The interstitial markings are less prominent today. There is no alveolar infiltrate, pleural effusion, or pneumothorax. The cardiac silhouette remains enlarged. The pulmonary vascularity remains prominent centrally. The endotracheal tube tip lies 3.6 cm above the carina. The esophagogastric tube tip projects below the inferior margin of the image. The right internal jugular venous catheter tip  projects over the posterior aspect of the right second rib. IMPRESSION: Cardiomegaly and mild central pulmonary vascular congestion with decreased pulmonary interstitial edema. The right internal jugular venous catheter has apparently been partially withdrawn and now its tip projects over the inferior aspect of the inferior vena cava at the level of the posterior second rib. The other support tubes are in stable position. Electronically Signed   By: David  [place] M.D.   On: 06/25/2015 07:25     STUDIES:   CULTURES: Blood 05/05 > Wound 05/05 > [facility] wound gram ne rod>>>GNR >>  ANTIBIOTICS: Vanc 05/05 > Amikacin 5/5>>> Clindamycin 05/05 > Unasyn 5/5>>>  SIGNIFICANT EVENTS: 05/05 > admitted with presumed necrotizing fasciitis of LLE, to OR 5/6- fib, rvr, lower pressors  LINES/TUBES: None  DISCUSSION: 45 y.o. super morbidly obese F admitted 05/05 with presumed nec fasc of LLE.  Was transferred from [facility] for surgical I&D.  ASSESSMENT / PLAN:  INFECTIOUS A:   Shock - presumed septic due to LLE necrotizing fasciitis.  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 P:   OR Monday planned ? ABX per ID Prognosis is guarded with these organisms Follow gram neg from [facility]  PULMONARY A: Acute resp failure Traumatic intubation IN OR OSA / OHS Int prominence / edema from fib rvr P:   SBts as tolerated    CARDIOVASCULAR A:  Shock - presumed septic due to LLE necrotizing fasciitis. Hx A.fib, dCHF. New fib rvr Rel AI P:  Levophed to MAP 60 Tele Empiric steroids to remain until off pressors Start heparin post op - some risk bleeding from wound post debridement    RENAL A:   AoCKD. AG and NONAG ATN, AKI P:   Maintain even balance Chem in am  Dc sodium acetate  GASTROINTESTINAL A:   Super morbid obesity. GERD. Nutrition. P:   Pantoprazole. NPO midnight for OR Resume TFs after  HEMATOLOGIC A:   VTE Prophylaxis. P:   SCD's unablwe to fit around leg / Heparin. CBC in AM.  ENDOCRINE A:   Hypothyroidism.  tsh - 8.8 P:   Continue outpatient synthroid as via tube, get tsh as outpt in 1 month   NEUROLOGIC A:   Pain P:   Ct  fent gtt Goal RASS -1   Family updated: None.  Interdisciplinary Family Meeting v Palliative Care Meeting:  Due by: 06/28/15.  CC time: 30 minutes.   Cyril Mourning MD. Tonny Bollman. [facility] Pager [phone] If no response call [phone]   06/25/2015

## 2015-06-25 NOTE — Progress Notes (Signed)
3 Days Post-Op  Subjective: POD 3 s/p left leg debridement of multiple area of infected soft tissue.  Remains intubated although awake.  NAE overnight.  Objective: Vital signs in last 24 hours: Temp:  [97 F (36.1 C)-97.6 F (36.4 C)] 97.5 F (36.4 C) (05/08 0727) Pulse Rate:  [87-122] 100 (05/08 0900) Resp:  [12-22] 14 (05/08 0900) BP: (82-118)/(30-83) 114/77 mmHg (05/08 0933) SpO2:  [99 %-100 %] 100 % (05/08 0900) FiO2 (%):  [30 %] 30 % (05/08 0931) Weight:  [268.983 kg (593 lb)] 268.983 kg (593 lb) (05/08 0500) Last BM Date: 06/21/15  Intake/Output from previous day: 05/07 0701 - 05/08 0700 In: 5973.5 [I.V.:3682.8; NG/GT:1790.7; IV Piggyback:500] Out: 1200 [Urine:1200] Intake/Output this shift: Total I/O In: 503.5 [I.V.:312.6; NG/GT:40; IV Piggyback:150.9] Out: 225 [Urine:225]  General appearance: alert, cooperative and intubated GI: normal findings: soft, non-tender and non distended Incision/Wound:  Multiple left leg wounds.  The superior most wound has some areas of necrotic fat, however, when probed, no evidence of tracking or undrained collections.  The inferior most part of this wound has some dried subcutaneous fat.  The wound inferior to this appears similar with some dusky adipose tissue.  The medial inferior most wound has a small amount of drainage.  Dorsum of the foot appears healthy.  Lateral leg wound appears healthy.    Lab Results:   Recent Labs  06/24/15 0547 06/25/15 0545  WBC 17.0* 20.6*  HGB 9.1* 8.5*  HCT 29.6* 27.2*  PLT 265 261   BMET  Recent Labs  06/24/15 0547 06/25/15 0545  NA 141 142  K 3.6 3.4*  CL 107 105  CO2 20* 23  GLUCOSE 169* 140*  BUN 59* 51*  CREATININE 2.62* 2.14*  CALCIUM 7.6* 7.7*   PT/INR  Recent Labs  06/22/15 1521 06/22/15 2100  LABPROT 20.9* 17.5*  INR 1.80* 1.43   ABG  Recent Labs  06/22/15 2044 06/23/15 1021  PHART 7.306* 7.321*  HCO3 18.9* 18.0*    Studies/Results: Dg Chest Port 1  View  06/25/2015  CLINICAL DATA:  Pulmonary edema, shortness of breath, intubated patient EXAM: PORTABLE CHEST 1 VIEW COMPARISON:  Portable chest x-ray of Jun 24, 2015 FINDINGS: The lungs are borderline hypoinflated. The interstitial markings are less prominent today. There is no alveolar infiltrate, pleural effusion, or pneumothorax. The cardiac silhouette remains enlarged. The pulmonary vascularity remains prominent centrally. The endotracheal tube tip lies 3.6 cm above the carina. The esophagogastric tube tip projects below the inferior margin of the image. The right internal jugular venous catheter tip projects over the posterior aspect of the right second rib. IMPRESSION: Cardiomegaly and mild central pulmonary vascular congestion with decreased pulmonary interstitial edema. The right internal jugular venous catheter has apparently been partially withdrawn and now its tip projects over the inferior aspect of the inferior vena cava at the level of the posterior second rib. The other support tubes are in stable position. Electronically Signed   By: David  SwazilandJordan M.D.   On: 06/25/2015 07:25   Dg Chest Port 1 View  06/24/2015  CLINICAL DATA:  Hypoxia EXAM: PORTABLE CHEST 1 VIEW COMPARISON:  Jun 22, 2015 FINDINGS: Endotracheal tube tip is 2.4 cm above the carina. Nasogastric tube tip and side port are below the diaphragm. Central catheter tip is in the superior vena cava. No pneumothorax. There is no edema or consolidation. There is cardiomegaly with pulmonary vascularity within normal limits. No adenopathy evident. IMPRESSION: Tube and catheter positions without pneumothorax. Stable cardiomegaly. No edema  or consolidation. Electronically Signed   By: Bretta Bang III M.D.   On: 06/24/2015 07:55   Dg Abd Portable 1v  06/23/2015  CLINICAL DATA:  45 year old female with a history of orogastric tube placement EXAM: PORTABLE ABDOMEN - 1 VIEW COMPARISON:  06/22/2015, 05/31/2015, 05/28/2015 FINDINGS: Limited plain  film demonstrating interval placement of gastric tube terminating in the left upper quadrant. The side port terminates near the GE junction, slightly below the diaphragm level. IMPRESSION: Interval placement of gastric tube terminating in the stomach left upper quadrant. Side port terminates near the GE junction, likely in the cardia. Signed, Yvone Neu. Loreta Ave, DO [facility] Electronically Signed   By: Gilmer Mor D.O.   On: 06/23/2015 17:57    Anti-infectives: Anti-infectives    Start     Dose/Rate Route Frequency Ordered Stop   06/25/15 0500  colistimethate (COLYMYCIN) 200 mg in sodium chloride 0.9 % 100 mL IVPB  Status:  Discontinued     200 mg 205.3 mL/hr over 30 Minutes Intravenous Every 36 hours 06/23/15 1535 06/24/15 2236   06/25/15 0500  colistimethate (COLYMYCIN) 70 mg in sodium chloride 0.9 % 100 mL IVPB     70 mg 201.9 mL/hr over 30 Minutes Intravenous Every 12 hours 06/24/15 2236     06/24/15 2000  amikacin (AMIKIN) 1,000 mg in dextrose 5 % 100 mL IVPB  Status:  Discontinued     1,000 mg 104 mL/hr over 60 Minutes Intravenous Every 48 hours 06/22/15 1608 06/23/15 1417   06/23/15 1800  Ampicillin-Sulbactam (UNASYN) 3 g in sodium chloride 0.9 % 100 mL IVPB     3 g 100 mL/hr over 60 Minutes Intravenous Every 6 hours 06/23/15 1435     06/23/15 1800  linezolid (ZYVOX) tablet 600 mg     600 mg Per Tube Every 12 hours 06/23/15 1706     06/23/15 1700  colistimethate (COLYMYCIN) 300 mg in sodium chloride 0.9 % 100 mL IVPB     300 mg 208 mL/hr over 30 Minutes Intravenous  Once 06/23/15 1535 06/23/15 1746   06/23/15 1530  linezolid (ZYVOX) 100 MG/5ML suspension 600 mg  Status:  Discontinued     600 mg Oral Every 12 hours 06/23/15 1407 06/23/15 1419   06/23/15 1430  linezolid (ZYVOX) tablet 600 mg  Status:  Discontinued     600 mg Per Tube Every 12 hours 06/23/15 1419 06/23/15 1706   06/22/15 2000  amikacin (AMIKIN) 1,050 mg in  dextrose 5 % 100 mL IVPB     1,050 mg 104.2 mL/hr over 60 Minutes Intravenous  Once 06/22/15 1608 06/22/15 2341   06/22/15 1900  vancomycin (VANCOCIN) 2,500 mg in sodium chloride 0.9 % 500 mL IVPB  Status:  Discontinued     2,500 mg 250 mL/hr over 120 Minutes Intravenous Every 48 hours 06/22/15 1837 06/23/15 1407   06/22/15 1800  Ampicillin-Sulbactam (UNASYN) 3 g in sodium chloride 0.9 % 100 mL IVPB  Status:  Discontinued     3 g 100 mL/hr over 60 Minutes Intravenous Every 8 hours 06/22/15 1608 06/23/15 1435   06/22/15 0900  [MAR Hold]  cefTAZidime (FORTAZ) 2 g in dextrose 5 % 50 mL IVPB  Status:  Discontinued     (MAR Hold since 06/22/15 1556)   2 g 100 mL/hr over 30 Minutes Intravenous Every 12 hours 06/22/15 0826 06/22/15 1609   06/22/15 0300  clindamycin (CLEOCIN) IVPB 900 mg  Status:  Discontinued     900  mg 100 mL/hr over 30 Minutes Intravenous Every 8 hours 06/22/15 0158 06/23/15 1407   06/22/15 0200  piperacillin-tazobactam (ZOSYN) IVPB 3.375 g  Status:  Discontinued     3.375 g 12.5 mL/hr over 240 Minutes Intravenous Every 8 hours 06/22/15 0158 06/22/15 0756      Assessment/Plan: s/p Procedure(s): DEBRIDEMENT LEFT LEG INFECTION (Left) Continue foley due to strict I&O and patient in ICU Would start hyrodtherapy debridement of the wounds.  Continue wet to dry dressing changes.  Likely need further debridement, however, given her large wounds and difficulty in management in conjunction with patient's immobility (has been bed bound for 2 years) may discuss other surgical options.  LOS: 3 days    Concha Se 06/25/2015

## 2015-06-25 NOTE — Progress Notes (Signed)
225cc of fentanyl gtt wasted in sink with this RN and witnessed by Ardith Darkeresa Crite RN

## 2015-06-25 NOTE — Progress Notes (Signed)
Heparin level came back elevated > 2.2. Level drawn by day shift RN most likely from PICC line which is where the heparin is infusing as well. Talked with night shift RN and will recollect heparin level after holding heparin gtt for a few minutes to verify correct level.  Enzo BiNathan Simranjit Thayer, PharmD, BCPS Clinical Pharmacist Pager [phone] 06/25/2015 7:46 PM

## 2015-06-26 DIAGNOSIS — N179 Acute kidney failure, unspecified: Secondary | ICD-10-CM | POA: Insufficient documentation

## 2015-06-26 LAB — CBC
HEMATOCRIT: 25.3 % — AB (ref 36.0–46.0)
Hemoglobin: 7.7 g/dL — ABNORMAL LOW (ref 12.0–15.0)
MCH: 29.7 pg (ref 26.0–34.0)
MCHC: 30.4 g/dL (ref 30.0–36.0)
MCV: 97.7 fL (ref 78.0–100.0)
Platelets: 208 10*3/uL (ref 150–400)
RBC: 2.59 MIL/uL — ABNORMAL LOW (ref 3.87–5.11)
RDW: 18.9 % — ABNORMAL HIGH (ref 11.5–15.5)
WBC: 20.6 10*3/uL — ABNORMAL HIGH (ref 4.0–10.5)

## 2015-06-26 LAB — GLUCOSE, CAPILLARY
GLUCOSE-CAPILLARY: 101 mg/dL — AB (ref 65–99)
GLUCOSE-CAPILLARY: 105 mg/dL — AB (ref 65–99)
GLUCOSE-CAPILLARY: 110 mg/dL — AB (ref 65–99)
Glucose-Capillary: 104 mg/dL — ABNORMAL HIGH (ref 65–99)
Glucose-Capillary: 119 mg/dL — ABNORMAL HIGH (ref 65–99)
Glucose-Capillary: 97 mg/dL (ref 65–99)

## 2015-06-26 LAB — BASIC METABOLIC PANEL WITH GFR
Anion gap: 12 (ref 5–15)
BUN: 45 mg/dL — ABNORMAL HIGH (ref 6–20)
CO2: 23 mmol/L (ref 22–32)
Calcium: 7.3 mg/dL — ABNORMAL LOW (ref 8.9–10.3)
Chloride: 111 mmol/L (ref 101–111)
Creatinine, Ser: 1.82 mg/dL — ABNORMAL HIGH (ref 0.44–1.00)
GFR calc Af Amer: 38 mL/min — ABNORMAL LOW
GFR calc non Af Amer: 32 mL/min — ABNORMAL LOW
Glucose, Bld: 112 mg/dL — ABNORMAL HIGH (ref 65–99)
Potassium: 3.3 mmol/L — ABNORMAL LOW (ref 3.5–5.1)
Sodium: 146 mmol/L — ABNORMAL HIGH (ref 135–145)

## 2015-06-26 LAB — HEPARIN LEVEL (UNFRACTIONATED)
HEPARIN UNFRACTIONATED: 0.77 [IU]/mL — AB (ref 0.30–0.70)
Heparin Unfractionated: 0.86 [IU]/mL — ABNORMAL HIGH (ref 0.30–0.70)
Heparin Unfractionated: 0.83 IU/mL — ABNORMAL HIGH (ref 0.30–0.70)

## 2015-06-26 MED ORDER — MUPIROCIN 2 % EX OINT
1.0000 "application " | TOPICAL_OINTMENT | Freq: Two times a day (BID) | CUTANEOUS | Status: DC
  Administered 2015-06-26 – 2015-06-29 (×6): 1 via NASAL
  Filled 2015-06-26 (×2): qty 22

## 2015-06-26 MED ORDER — POTASSIUM CHLORIDE 10 MEQ/50ML IV SOLN
10.0000 meq | INTRAVENOUS | Status: AC
  Administered 2015-06-26 (×2): 10 meq via INTRAVENOUS
  Filled 2015-06-26 (×2): qty 50

## 2015-06-26 MED ORDER — CETYLPYRIDINIUM CHLORIDE 0.05 % MT LIQD
7.0000 mL | Freq: Two times a day (BID) | OROMUCOSAL | Status: DC
  Administered 2015-06-26 – 2015-06-29 (×5): 7 mL via OROMUCOSAL

## 2015-06-26 MED ORDER — HEPARIN (PORCINE) IN NACL 100-0.45 UNIT/ML-% IJ SOLN
1600.0000 [IU]/h | INTRAMUSCULAR | Status: DC
  Administered 2015-06-26: 1650 [IU]/h via INTRAVENOUS
  Administered 2015-06-27 – 2015-06-28 (×2): 1400 [IU]/h via INTRAVENOUS
  Filled 2015-06-26 (×5): qty 250

## 2015-06-26 MED ORDER — HYDROCORTISONE NA SUCCINATE PF 100 MG IJ SOLR
50.0000 mg | Freq: Every day | INTRAMUSCULAR | Status: DC
  Administered 2015-06-27: 50 mg via INTRAVENOUS
  Filled 2015-06-26: qty 1

## 2015-06-26 MED ORDER — SODIUM CHLORIDE 0.9 % IV SOLN
500.0000 mg | Freq: Once | INTRAVENOUS | Status: AC
  Administered 2015-06-26: 500 mg via INTRAVENOUS
  Filled 2015-06-26: qty 500

## 2015-06-26 MED ORDER — COLLAGENASE 250 UNIT/GM EX OINT
TOPICAL_OINTMENT | Freq: Every day | CUTANEOUS | Status: DC
  Administered 2015-06-26 – 2015-06-29 (×4): via TOPICAL
  Filled 2015-06-26 (×3): qty 30

## 2015-06-26 MED ORDER — PRO-STAT SUGAR FREE PO LIQD
30.0000 mL | Freq: Two times a day (BID) | ORAL | Status: DC
  Filled 2015-06-26: qty 30

## 2015-06-26 MED ORDER — PANTOPRAZOLE SODIUM 40 MG PO TBEC
40.0000 mg | DELAYED_RELEASE_TABLET | Freq: Every day | ORAL | Status: DC
  Administered 2015-06-26 – 2015-06-29 (×4): 40 mg via ORAL
  Filled 2015-06-26 (×4): qty 1

## 2015-06-26 MED ORDER — CHLORHEXIDINE GLUCONATE CLOTH 2 % EX PADS
6.0000 | MEDICATED_PAD | Freq: Every day | CUTANEOUS | Status: DC
Start: 2015-06-27 — End: 2015-06-30
  Administered 2015-06-27 – 2015-06-29 (×3): 6 via TOPICAL

## 2015-06-26 MED ORDER — SODIUM CHLORIDE 0.9 % IV SOLN
500.0000 mg | Freq: Four times a day (QID) | INTRAVENOUS | Status: DC
  Administered 2015-06-26 – 2015-06-29 (×13): 500 mg via INTRAVENOUS
  Filled 2015-06-26 (×20): qty 500

## 2015-06-26 NOTE — Progress Notes (Signed)
4 Days Post-Op  Subjective: POD 4 s/p debridement of LLE wounds.  Extubated yesterday.  NAE overnight.  States that she isn't having much pain in leg.  Objective: Vital signs in last 24 hours: Temp:  [97.5 F (36.4 C)-99 F (37.2 C)] 99 F (37.2 C) (05/09 0729) Pulse Rate:  [94-141] 126 (05/09 0800) Resp:  [12-32] 13 (05/09 0800) BP: (81-121)/(25-91) 117/64 mmHg (05/09 0800) SpO2:  [98 %-100 %] 100 % (05/09 0800) FiO2 (%):  [30 %] 30 % (05/08 1342) Last BM Date: 06/26/15  Intake/Output from previous day: 05/08 0701 - 05/09 0700 In: 2623.2 [I.V.:1510.4; NG/GT:360; IV Piggyback:752.9] Out: 1725 [Urine:1725] Intake/Output this shift: Total I/O In: 44.7 [I.V.:44.7] Out: -   General appearance: alert, cooperative, appears stated age and morbidly obese Incision/Wound: Upper medial wounds appear to be clean based, some pale fat with some dried fat at edges but overall clean.  No purulence.  Medial lowest wound with some dried fat and wet eschar.  Dorsum of foot looks good.  Lateral wound appears clean based.  Lab Results:   Recent Labs  06/25/15 0545 06/26/15 0434  WBC 20.6* 20.6*  HGB 8.5* 7.7*  HCT 27.2* 25.3*  PLT 261 208   BMET  Recent Labs  06/25/15 0545 06/26/15 0434  NA 142 146*  K 3.4* 3.3*  CL 105 111  CO2 23 23  GLUCOSE 140* 112*  BUN 51* 45*  CREATININE 2.14* 1.82*  CALCIUM 7.7* 7.3*   PT/INR No results for input(s): LABPROT, INR in the last 72 hours. ABG  Recent Labs  06/23/15 1021  PHART 7.321*  HCO3 18.0*    Studies/Results: Dg Chest Port 1 View  06/25/2015  CLINICAL DATA:  Pulmonary edema, shortness of breath, intubated patient EXAM: PORTABLE CHEST 1 VIEW COMPARISON:  Portable chest x-ray of Jun 24, 2015 FINDINGS: The lungs are borderline hypoinflated. The interstitial markings are less prominent today. There is no alveolar infiltrate, pleural effusion, or pneumothorax. The cardiac silhouette remains enlarged. The pulmonary vascularity remains  prominent centrally. The endotracheal tube tip lies 3.6 cm above the carina. The esophagogastric tube tip projects below the inferior margin of the image. The right internal jugular venous catheter tip projects over the posterior aspect of the right second rib. IMPRESSION: Cardiomegaly and mild central pulmonary vascular congestion with decreased pulmonary interstitial edema. The right internal jugular venous catheter has apparently been partially withdrawn and now its tip projects over the inferior aspect of the inferior vena cava at the level of the posterior second rib. The other support tubes are in stable position. Electronically Signed   By: David  SwazilandJordan M.D.   On: 06/25/2015 07:25    Anti-infectives: Anti-infectives    Start     Dose/Rate Route Frequency Ordered Stop   06/25/15 0500  colistimethate (COLYMYCIN) 200 mg in sodium chloride 0.9 % 100 mL IVPB  Status:  Discontinued     200 mg 205.3 mL/hr over 30 Minutes Intravenous Every 36 hours 06/23/15 1535 06/24/15 2236   06/25/15 0500  colistimethate (COLYMYCIN) 70 mg in sodium chloride 0.9 % 100 mL IVPB     70 mg 201.9 mL/hr over 30 Minutes Intravenous Every 12 hours 06/24/15 2236     06/24/15 2000  amikacin (AMIKIN) 1,000 mg in dextrose 5 % 100 mL IVPB  Status:  Discontinued     1,000 mg 104 mL/hr over 60 Minutes Intravenous Every 48 hours 06/22/15 1608 06/23/15 1417   06/23/15 1800  Ampicillin-Sulbactam (UNASYN) 3 g in sodium chloride  0.9 % 100 mL IVPB     3 g 100 mL/hr over 60 Minutes Intravenous Every 6 hours 06/23/15 1435     06/23/15 1800  linezolid (ZYVOX) tablet 600 mg     600 mg Per Tube Every 12 hours 06/23/15 1706     06/23/15 1700  colistimethate (COLYMYCIN) 300 mg in sodium chloride 0.9 % 100 mL IVPB     300 mg 208 mL/hr over 30 Minutes Intravenous  Once 06/23/15 1535 06/23/15 1746   06/23/15 1530  linezolid (ZYVOX) 100 MG/5ML suspension 600 mg  Status:  Discontinued     600 mg Oral Every 12 hours 06/23/15 1407 06/23/15  1419   06/23/15 1430  linezolid (ZYVOX) tablet 600 mg  Status:  Discontinued     600 mg Per Tube Every 12 hours 06/23/15 1419 06/23/15 1706   06/22/15 2000  amikacin (AMIKIN) 1,050 mg in dextrose 5 % 100 mL IVPB     1,050 mg 104.2 mL/hr over 60 Minutes Intravenous  Once 06/22/15 1608 06/22/15 2341   06/22/15 1900  vancomycin (VANCOCIN) 2,500 mg in sodium chloride 0.9 % 500 mL IVPB  Status:  Discontinued     2,500 mg 250 mL/hr over 120 Minutes Intravenous Every 48 hours 06/22/15 1837 06/23/15 1407   06/22/15 1800  Ampicillin-Sulbactam (UNASYN) 3 g in sodium chloride 0.9 % 100 mL IVPB  Status:  Discontinued     3 g 100 mL/hr over 60 Minutes Intravenous Every 8 hours 06/22/15 1608 06/23/15 1435   06/22/15 0900  [MAR Hold]  cefTAZidime (FORTAZ) 2 g in dextrose 5 % 50 mL IVPB  Status:  Discontinued     (MAR Hold since 06/22/15 1556)   2 g 100 mL/hr over 30 Minutes Intravenous Every 12 hours 06/22/15 0826 06/22/15 1609   06/22/15 0300  clindamycin (CLEOCIN) IVPB 900 mg  Status:  Discontinued     900 mg 100 mL/hr over 30 Minutes Intravenous Every 8 hours 06/22/15 0158 06/23/15 1407   06/22/15 0200  piperacillin-tazobactam (ZOSYN) IVPB 3.375 g  Status:  Discontinued     3.375 g 12.5 mL/hr over 240 Minutes Intravenous Every 8 hours 06/22/15 0158 06/22/15 0756      Assessment/Plan: s/p Procedure(s): DEBRIDEMENT LEFT LEG INFECTION (Left) Overall wounds appear to be doing well.  Would continue with W-D dressing changes BID and plan for hydrotherapy today.  WBC stable, continue to monitor.  Continue Abx. AKI improving. Prostat for protein supplementation.  LOS: 4 days    Concha Se 06/26/2015

## 2015-06-26 NOTE — Progress Notes (Signed)
ANTICOAGULATION CONSULT NOTE - Follow up Consult  Pharmacy Consult:  Heparin Indication: atrial fibrillation  No Known Allergies  Patient Measurements: Height: 5' (152.4 cm) Weight: (!) 593 lb (268.983 kg) IBW/kg (Calculated) : 45.5 Heparin Dosing Weight: 122 kg  Vital Signs: Temp: 97.9 F (36.6 C) (05/09 0356) Temp Source: Axillary (05/09 0356) BP: 114/77 mmHg (05/09 0400) Pulse Rate: 136 (05/09 0400)  Labs:  Recent Labs  06/23/15 2314 06/24/15 0547 06/25/15 0545 06/25/15 1857 06/25/15 1957 06/26/15 0434  HGB  --  9.1* 8.5*  --   --  7.7*  HCT  --  29.6* 27.2*  --   --  25.3*  PLT  --  265 261  --   --  208  HEPARINUNFRC  --   --   --  >2.20* 1.28* 0.86*  CREATININE 2.64* 2.62* 2.14*  --   --   --     Estimated Creatinine Clearance: 70.7 mL/min (by C-G formula based on Cr of 2.14).   Assessment: 1946 YOF with history of Afib on Coumadin PTA.  Coumadin was reversed for possible surgery, which is now deferred.  Pharmacy consulted for heparin bridge. Heparin level remains supratherapeutic on 2100 units/hr. Hgb down to 7.7, plt down to 208. No overt bleeding noted. Heparin level drawn appropriately.   Goal of Therapy:  Heparin level 0.3-0.7 units/ml Monitor platelets by anticoagulation protocol: Yes  Plan:  Decrease heparin gtt to 1800 units/hr Check 6 hr HL F/u Hgb  Christoper Fabianaron Dorean Hiebert, PharmD, BCPS Clinical pharmacist, pager [phone] 06/26/2015 4:56 AM

## 2015-06-26 NOTE — Progress Notes (Signed)
ANTICOAGULATION CONSULT NOTE - Follow up Consult  Pharmacy Consult:  Heparin Indication: atrial fibrillation   No Known Allergies  Patient Measurements: Height: 5' (152.4 cm) Weight: (!) 593 lb (268.983 kg) IBW/kg (Calculated) : 45.5 Heparin Dosing Weight: 122 kg  Vital Signs: Temp: 97.8 F (36.6 C) (05/09 1953) Temp Source: Oral (05/09 1953) BP: 91/62 mmHg (05/09 2000) Pulse Rate: 113 (05/09 1500)  Labs:  Recent Labs  06/24/15 0547 06/25/15 0545  06/26/15 0434 06/26/15 1100 06/26/15 2023  HGB 9.1* 8.5*  --  7.7*  --   --   HCT 29.6* 27.2*  --  25.3*  --   --   PLT 265 261  --  208  --   --   HEPARINUNFRC  --   --   < > 0.86* 0.77* 0.83*  CREATININE 2.62* 2.14*  --  1.82*  --   --   < > = values in this interval not displayed.  Estimated Creatinine Clearance: 83.1 mL/min (by C-G formula based on Cr of 1.82).  Assessment: [age] YOF with history of Afib on Coumadin PTA.  Coumadin was reversed for possible surgery, which is deferred and IV heparin was started.  Heparin level trended up but remains elevated.  No bleeding reported.  HL continues to increase to 0.83 after reduction to 1650 units/hr. Nurse reports no issues with infusion or bleeding.  Goal of Therapy:  Heparin level 0.3-0.7 units/ml Monitor platelets by anticoagulation protocol: Yes   Plan:  Decrease heparin gtt to 1400 units/hr Daily HL / CBC Monitor s/sx of bleeding  Arlean Hoppingorey M. Newman PiesBall, PharmD, BCPS Clinical Pharmacist Pager [phone]  06/26/2015, 8:54 PM

## 2015-06-26 NOTE — Progress Notes (Signed)
CSW continues to follow for disposition.       Yezenia Fredrick Gardner,MSW, LCSW [facility] ED/2M Clinical Social Worker [phone] 

## 2015-06-26 NOTE — Progress Notes (Signed)
PULMONARY / CRITICAL CARE MEDICINE   Name: Michele Weber MRN: [id number] DOB: 11/17/1970    ADMISSION DATE:  06/22/2015 CONSULTATION DATE:  06/22/15  REFERRING MD:  TRH at [facility]  CHIEF COMPLAINT:  Cellulitis LLE  HISTORY OF PRESENT ILLNESS:  Michele Weber is a 45 y.o. female with PMH as outlined below including super morbid obesity.  She had recent admission to [facility] for gram negative bacteremia (acinetobacter) and was discharged to rehab facility.  She then was admitted to [facility] on 06/20/15 for cellulitis of the left lower extremity / non-healing wounds.  At [facility] she was evaluated by surgery who recommended surgical debridement; however, anesthesia services at [facility] felt that she would be better served at tertiary care center. She was subsequently transferred to [facility] for further evaluation and management including probable surgical I&D.  SUBJECTIVE:  Improving, 2 L [place] Fib rvr on amio gtt Levo d/c 5/8  Low grade temp  VITAL SIGNS: BP 105/75 mmHg  Pulse 134  Temp(Src) 99 F (37.2 C) (Oral)  Resp 33  Ht 5' (1.524 m)  Wt 593 lb (268.983 kg)  BMI 115.81 kg/m2  SpO2 100%  LMP 05/28/2015  HEMODYNAMICS: CVP:  [1 mmHg-8 mmHg] 8 mmHg  VENTILATOR SETTINGS: Vent Mode:  [-] CPAP;PSV FiO2 (%):  [30 %] 30 % Set Rate:  [16 bmp] 16 bmp Vt Set:  [550 mL] 550 mL PEEP:  [5 cmH20] 5 cmH20 Pressure Support:  [5 cmH20] 5 cmH20 Plateau Pressure:  [19 cmH20] 19 cmH20  INTAKE / OUTPUT: I/O last 3 completed shifts: In: 4403.5 [I.V.:2710.7; NG/GT:740; IV Piggyback:952.9] Out: 2475 [Urine:2475]   PHYSICAL EXAMINATION: General: Super morbid obese female Neuro: Alert, appropriate, moves all ext uppers HEENT: PERRL Cardiovascular:s1 s2 irt distant Lungs: reduced BL Abdomen: Super morbidly obese.  BS none, no r/g Musculoskeletal: Massive lymphedema. Skin: Extensive wound to LLE that is very malodorous, R thigh wound  LABS:  BMET  Recent Labs Lab 06/24/15 0547 06/25/15 0545  06/26/15 0434  NA 141 142 146*  K 3.6 3.4* 3.3*  CL 107 105 111  CO2 20* 23 23  BUN 59* 51* 45*  CREATININE 2.62* 2.14* 1.82*  GLUCOSE 169* 140* 112*    Electrolytes  Recent Labs Lab 06/22/15 0507 06/23/15 0514 06/23/15 2314 06/24/15 0547 06/25/15 0545 06/26/15 0434  CALCIUM 8.0* 7.6* 7.3* 7.6* 7.7* 7.3*  MG 1.6* 1.6* 1.4*  --   --   --   PHOS 5.9* 4.8*  --   --   --   --     CBC  Recent Labs Lab 06/24/15 0547 06/25/15 0545 06/26/15 0434  WBC 17.0* 20.6* 20.6*  HGB 9.1* 8.5* 7.7*  HCT 29.6* 27.2* 25.3*  PLT 265 261 208    Coag's  Recent Labs Lab 06/22/15 0915 06/22/15 1521 06/22/15 2100  INR 3.34* 1.80* 1.43    Sepsis Markers  Recent Labs Lab 06/20/15 1711 06/22/15 0448  LATICACIDVEN 2.0 1.4    ABG  Recent Labs Lab 06/22/15 1649 06/22/15 2044 06/23/15 1021  PHART 7.348* 7.306* 7.321*  PCO2ART 39.2 38.5 35.7  PO2ART 561.0* 210* 181*    Liver Enzymes  Recent Labs Lab 06/20/15 1711 06/24/15 0547 06/25/15 0545  AST 15 13* 9*  ALT ALKPHOS 69 58 68  BILITOT 0.6 0.4 0.4  ALBUMIN 2.9* 1.7* 1.8*    Cardiac Enzymes No results for input(s): TROPONINI, PROBNP in the last 168 hours.  Glucose  Recent Labs Lab 06/25/15 1155 06/25/15 1515 06/25/15 1932 06/25/15  2359 06/26/15 0353 06/26/15 0728  GLUCAP 126* 117* 102* 97 101* 105*    Imaging No results found.   STUDIES:   CULTURES: Blood 05/05 > Wound 05/03>(P stuartii) Wound 05/05 >(Acinetobacter- S- imipenem)  [facility] wound gram ne rod>>>GNR >>  ANTIBIOTICS: Vanc 05/05 >05/07 Amikacin 5/5>>>05/07 Clindamycin 05/05 >05/06 Unasyn 5/5>>>5/9 Imipenem  05/09>>  SIGNIFICANT EVENTS: 05/05 > admitted with presumed necrotizing fasciitis of LLE, to OR 5/6- fib, rvr, lower pressors 5/8- Off pressors/ remains on amio  LINES/TUBES: None  DISCUSSION: 45 y.o. super morbidly obese F admitted 05/05 with presumed nec fasc of LLE.  [facility]  [facility].  ASSESSMENT / PLAN:  INFECTIOUS A:   Shock -( resolving) presumed septic due to LLE necrotizing fasciitis.  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 P:   Hydrotherapy with wound debridement 5/9  ABX per ID Prognosis is guarded with these organisms [facility]  PULMONARY A: Acute resp failure ( resolved) Traumatic intubation IN OR : 05/05 OSA / OHS Int prominence / edema from fib rvr Weaned to 2L [place] P:   Monitor Sao2's Vigorous pulmonary toilet IS  Q 1 hour while awake Cough and deep breath Mobilize when able Out patient follow up for OSA and possible CPAP needs    CARDIOVASCULAR A:  Shock - presumed septic due to LLE necrotizing fasciitis.>> resolving>> off pressors Hx A.fib, dCHF. New fib rvr>> Amio Gtt Rel AI P:  Levophed off since 2200 05/08 Tele Empiric steroids to remain until off pressors/ titrated to 50 mg daily  heparin post op - pharmacy managing / Monitor for bleeding post debridement     RENAL A:  Hypokalemia  Hypernatremia AoCKD. AG and NONAG ATN, AKI Creatinine improving P:   Maintain even balance Replete electrolytes as needed Chem daily  Dc sodium acetate Consider free water if Na remains elevated after po's started   GASTROINTESTINAL A:   Super morbid obesity. GERD. Nutrition. BM 5/8 P:   Pantoprazole. Heart Healthy/ carb modified diet  HEMATOLOGIC A:   VTE Prophylaxis. Heparin for fib Anemia P:  SCD's unable to fit around leg / Heparin. Monitor for bleeding from wound post debridement CBC daily/ trend HGB Transfuse for HGB <7  ENDOCRINE A:   Hypothyroidism.  tsh - 8.8 P:   Continue outpatient synthroid  po , get tsh as outpt in 1 month   NEUROLOGIC A:   Pain P:   Ct  fent prn  For dressing changes Goal RASS 0   Family updated: No family at bedside  Interdisciplinary Family Meeting v Palliative Care Meeting:  Due by:  06/28/15.  Bevelyn NgoSarah F. Groce, AGACNP-BC [place] [facility] 06/26/2015

## 2015-06-26 NOTE — Progress Notes (Signed)
Physical Therapy Wound Treatment Patient Details  Name: Michele Weber MRN: [id number] Date of Birth: 1970-08-22  Today's Date: 06/26/2015 Time: 8341-9622 Time Calculation (min): 113 min  Subjective  Subjective: Pt pleasant and agreeable to hydrotherapy Patient and Family Stated Goals: heal wounds Date of Onset: 05/20/15 (Per pt) Prior Treatments: Prior I&D per pt  Pain Score: Pain Score: 3   Wound Assessment  Wound / Incision (Open or Dehisced) 06/20/15 Foot Left large weeping wound on top of left foot (Active)  Dressing Type ABD 06/26/2015  2:53 PM  Dressing Changed Changed 06/26/2015  8:00 AM  Dressing Status Clean;Dry;Intact 06/26/2015  2:53 PM  Dressing Change Frequency Twice a day 06/26/2015  2:53 PM  Site / Wound Assessment Dressing in place / Unable to assess 06/26/2015  2:53 PM  % Wound base Red or Granulating 90% 06/26/2015  2:53 PM  % Wound base Yellow 5% 06/26/2015  2:53 PM  % Wound base Black 5% 06/26/2015  2:53 PM  Peri-wound Assessment Intact 06/26/2015  2:53 PM  Wound Length (cm) 9 cm 06/26/2015  2:53 PM  Wound Width (cm) 11 cm 06/26/2015  2:53 PM  Wound Depth (cm) 1 cm 06/26/2015  2:53 PM  Margins Unattached edges (unapproximated) 06/26/2015  2:53 PM  Closure None 06/26/2015  2:53 PM  Drainage Amount Minimal 06/26/2015  2:53 PM  Drainage Description Purulent 06/26/2015  2:53 PM  Treatment Debridement (Selective);Hydrotherapy (Pulse lavage);Packing (Saline gauze) 06/26/2015  2:53 PM  Wound / Incision (Open or Dehisced) 06/26/15 Other (Comment) Thigh Left Superior medial (Active)  Dressing Type ABD;Gauze (Comment);Moist to dry;Barrier Film (skin prep) 06/26/2015  2:53 PM  Dressing Changed Changed 06/26/2015  2:53 PM  Dressing Status Clean;Dry;Intact 06/26/2015  2:53 PM  Dressing Change Frequency Daily 06/26/2015  2:53 PM  Site / Wound Assessment Pink;Brown;Yellow 06/26/2015  2:53 PM  % Wound base Red or Granulating 15% 06/26/2015  2:53 PM  % Wound base Yellow 80% 06/26/2015  2:53 PM  % Wound base Black 5%  06/26/2015  2:53 PM  Peri-wound Assessment Intact 06/26/2015  2:53 PM  Wound Length (cm) 15 cm 06/26/2015  2:53 PM  Wound Width (cm) 30 cm 06/26/2015  2:53 PM  Wound Depth (cm) 4.5 cm 06/26/2015  2:53 PM  Undermining (cm) 4.1 at 11 o'clock, 2.5 at 12 o'clock 06/26/2015  2:53 PM  Margins Unattached edges (unapproximated) 06/26/2015  2:53 PM  Closure None 06/26/2015  2:53 PM  Drainage Amount Minimal 06/26/2015  2:53 PM  Drainage Description Purulent 06/26/2015  2:53 PM  Treatment Debridement (Selective);Hydrotherapy (Pulse lavage);Packing (Saline gauze) 06/26/2015  2:53 PM     Wound / Incision (Open or Dehisced) 06/26/15 Other (Comment) Thigh Left Inferior medial (Active)  Dressing Type ABD;Gauze (Comment);Moist to dry;Barrier Film (skin prep) 06/26/2015  2:53 PM  Dressing Changed Changed 06/26/2015  2:53 PM  Dressing Status Clean;Dry;Intact 06/26/2015  2:53 PM  Dressing Change Frequency Daily 06/26/2015  2:53 PM  Site / Wound Assessment Brown;Yellow;Pink 06/26/2015  2:53 PM  % Wound base Red or Granulating 10% 06/26/2015  2:53 PM  % Wound base Yellow 85% 06/26/2015  2:53 PM  % Wound base Black 5% 06/26/2015  2:53 PM  Peri-wound Assessment Intact 06/26/2015  2:53 PM  Wound Length (cm) 14 cm 06/26/2015  2:53 PM  Wound Width (cm) 38.5 cm 06/26/2015  2:53 PM  Wound Depth (cm) 6.5 cm 06/26/2015  2:53 PM  Margins Unattached edges (unapproximated) 06/26/2015  2:53 PM  Closure None 06/26/2015  2:53 PM  Drainage Amount  Minimal 06/26/2015  2:53 PM  Drainage Description Purulent 06/26/2015  2:53 PM  Treatment Debridement (Selective);Hydrotherapy (Pulse lavage);Packing (Saline gauze) 06/26/2015  2:53 PM     Wound / Incision (Open or Dehisced) 06/26/15 Other (Comment) Thigh Left;Medial Calf (Active)  Dressing Type ABD;Gauze (Comment);Moist to dry;Barrier Film (skin prep) 06/26/2015  2:53 PM  Dressing Changed Changed 06/26/2015  2:53 PM  Dressing Status Clean;Dry;Intact 06/26/2015  2:53 PM  Dressing Change Frequency Daily 06/26/2015  2:53 PM  Site / Wound  Assessment Brown;Yellow;Pink 06/26/2015  2:53 PM  % Wound base Red or Granulating 15% 06/26/2015  2:53 PM  % Wound base Yellow 80% 06/26/2015  2:53 PM  % Wound base Black 5% 06/26/2015  2:53 PM  Peri-wound Assessment Intact 06/26/2015  2:53 PM  Wound Length (cm) 8.5 cm 06/26/2015  2:53 PM  Wound Width (cm) 12.5 cm 06/26/2015  2:53 PM  Wound Depth (cm) 5 cm 06/26/2015  2:53 PM  Margins Unattached edges (unapproximated) 06/26/2015  2:53 PM  Closure None 06/26/2015  2:53 PM  Drainage Amount Minimal 06/26/2015  2:53 PM  Drainage Description Purulent 06/26/2015  2:53 PM  Treatment Debridement (Selective);Hydrotherapy (Pulse lavage);Packing (Saline gauze) 06/26/2015  2:53 PM     Wound / Incision (Open or Dehisced) 06/26/15 Other (Comment) Thigh Left;Lateral (Active)  Dressing Type ABD;Gauze (Comment);Barrier Film (skin prep);Moist to dry 06/26/2015  2:53 PM  Dressing Changed Changed 06/26/2015  2:53 PM  Dressing Status Clean;Dry;Intact 06/26/2015  2:53 PM  Dressing Change Frequency Daily 06/26/2015  2:53 PM  Site / Wound Assessment Brown;Pink;Yellow 06/26/2015  2:53 PM  % Wound base Red or Granulating 20% 06/26/2015  2:53 PM  % Wound base Yellow 75% 06/26/2015  2:53 PM  % Wound base Black 5% 06/26/2015  2:53 PM  Wound Length (cm) 6.5 cm 06/26/2015  2:53 PM  Wound Width (cm) 8 cm 06/26/2015  2:53 PM  Wound Depth (cm) 7 cm 06/26/2015  2:53 PM  Margins Unattached edges (unapproximated) 06/26/2015  2:53 PM  Closure None 06/26/2015  2:53 PM  Drainage Amount Minimal 06/26/2015  2:53 PM  Drainage Description Purulent 06/26/2015  2:53 PM  Treatment Debridement (Selective);Hydrotherapy (Pulse lavage);Packing (Saline gauze) 06/26/2015  2:53 PM   Hydrotherapy Pulsed lavage therapy - wound location: LLE wounds Pulsed Lavage with Suction (psi): 12 psi (8 at times due to pain ) Pulsed Lavage with Suction - Normal Saline Used: 1000 mL (x4) Pulsed Lavage Tip: Tip with splash shield Selective Debridement Selective Debridement - Location: LLE  wounds Selective Debridement - Tools Used: Scissors;Forceps Selective Debridement - Tissue Removed: brown and yellow necrotic tissue   Wound Assessment and Plan  Wound Therapy - Assess/Plan/Recommendations Wound Therapy - Clinical Statement: Pt presents to hydrotherapy with multiple LLE wounds due to necrotizing fasciitis. Pt will benefit from hydrotherapy for selective removal of non-viable tissue and to decrease bioburden.  Wound Therapy - Functional Problem List: Decreased strength and AROM Factors Delaying/Impairing Wound Healing: Immobility;Multiple medical problems (Necrotizing fasciitis) Hydrotherapy Plan: Debridement;Dressing change;Patient/family education;Pulsatile lavage with suction Wound Therapy - Frequency: 6X / week Wound Therapy - Follow Up Recommendations: Skilled nursing facility Wound Plan: See above  Wound Therapy Goals- Improve the function of patient's integumentary system by progressing the wound(s) through the phases of wound healing (inflammation - proliferation - remodeling) by: Decrease Necrotic Tissue to: 25% Decrease Necrotic Tissue - Progress: Goal set today Increase Granulation Tissue to: 75% Increase Granulation Tissue - Progress: Goal set today Time For Goal Achievement: 7 days Wound Therapy - Potential for  Goals: Good  Goals will be updated until maximal potential achieved or discharge criteria met.  Discharge criteria: when goals achieved, discharge from hospital, MD decision/surgical intervention, no progress towards goals, refusal/missing three consecutive treatments without notification or medical reason.  GP     Rolinda Roan 06/26/2015, 3:30 PM   Rolinda Roan, PT, DPT Acute Rehabilitation Services Pager: [phone]

## 2015-06-26 NOTE — Progress Notes (Signed)
ANTICOAGULATION & ANTIBIOTIC CONSULT NOTE - Follow up Consult  Pharmacy Consult:  Heparin + Primaxin Indication: atrial fibrillation + Wound infection  No Known Allergies  Patient Measurements: Height: 5' (152.4 cm) Weight: (!) 593 lb (268.983 kg) IBW/kg (Calculated) : 45.5 Heparin Dosing Weight: 122 kg  Vital Signs: Temp: 99 F (37.2 C) (05/09 0729) Temp Source: Oral (05/09 0729) BP: 117/64 mmHg (05/09 0800) Pulse Rate: 126 (05/09 0800)  Labs:  Recent Labs  06/24/15 0547 06/25/15 0545 06/25/15 1857 06/25/15 1957 06/26/15 0434  HGB 9.1* 8.5*  --   --  7.7*  HCT 29.6* 27.2*  --   --  25.3*  PLT 265 261  --   --  208  HEPARINUNFRC  --   --  >2.20* 1.28* 0.86*  CREATININE 2.62* 2.14*  --   --  1.82*    Estimated Creatinine Clearance: 83.1 mL/min (by C-G formula based on Cr of 1.82).    Assessment: [age] YOF with history of Afib on Coumadin PTA.  Coumadin was reversed for possible surgery, which is deferred and IV heparin was started.  Heparin level trended up but remains elevated.  No bleeding reported.   Patient has a complicated infectious history previously tried on multiple antibiotics.  Wound culture from [facility] continues to grow Acinetobacter.  [facility] Providencia.  Antibiotics to narrow to Primaxin.  Patient's renal function is improving.  Zosyn 5/3 >> 5/5 Vanc 5/3 >> 5/6 Clinda 5/3 >> 5/6 Amikacin 5/5 >> 5/6 Unasyn 5/5 >> 5/9 Zyvox 5/6 >> 5/9 Colistin IV 5/6 >> 5/9 Primaxin 5/9 >>  5/4 VR = 20 mcg/mL (~24 hours post first 2gm dose) 5/5 VR = 13 mcg/ml (~45.5 hours post first 2g dose), pt specific   Cultures from last admit:  4/5 WCx + BCx (old cx) >> MDR Acinetabacter (pan-R except I-Tobra/Zosyn/CTX, S-Unasyn/Ceftaz/Imi) 4/9 BCx (old cx) >> MDR Acinetobacter (pan-R except I-Ceftaz/Unasyn) * Also noted to be R-Avycaz per discussion with ID (Cassie)  Cultures from [facility]: 5/3 Blood x 2 - ngtd 5/3 MRSA PCR negative 5/3 WCx >>  Providencia (sensitive Unasyn, CTX, Zosyn, Primaxin)  This admit:  5/5 WCx (intra-op) - Acinetobacter (sensitive Primaxin, Zosyn) 5/5 BCx - NGTD    Goal of Therapy:  Heparin level 0.3-0.7 units/ml Monitor platelets by anticoagulation protocol: Yes  Resolution of infection   Plan:  - Decrease heparin gtt to 1650 units/hr - Check 6 hr HL - Daily HL / CBC - Primaxin 500mg  IV Q6H - Monitor renal fxn, clinical progress   Lagretta Loseke D. Laney Potashang, PharmD, BCPS Pager:  [phone] 06/26/2015, 12:46 PM

## 2015-06-26 NOTE — Progress Notes (Signed)
INFECTIOUS DISEASE PROGRESS NOTE  ID: Michele Weber is a 45 y.o. 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HCC)   OSA (obstructive sleep apnea)   Obesity hypoventilation syndrome (HCC)   Chronic kidney disease (CKD), stage IV (severe) (HCC)   Endotracheally intubated   Encounter for imaging study to confirm orogastric (OG) tube placement  Subjective: Without complaints.  Has some erythema of extremities, itching.   Abtx:  Anti-infectives    Start     Dose/Rate Route Frequency Ordered Stop   06/25/15 0500  colistimethate (COLYMYCIN) 200 mg in sodium chloride 0.9 % 100 mL IVPB  Status:  Discontinued     200 mg 205.3 mL/hr over 30 Minutes Intravenous Every 36 hours 06/23/15 1535 06/24/15 2236   06/25/15 0500  colistimethate (COLYMYCIN) 70 mg in sodium chloride 0.9 % 100 mL IVPB     70 mg 201.9 mL/hr over 30 Minutes Intravenous Every 12 hours 06/24/15 2236     06/24/15 2000  amikacin (AMIKIN) 1,000 mg in dextrose 5 % 100 mL IVPB  Status:  Discontinued     1,000 mg 104 mL/hr over 60 Minutes Intravenous Every 48 hours 06/22/15 1608 06/23/15 1417   06/23/15 1800  Ampicillin-Sulbactam (UNASYN) 3 g in sodium chloride 0.9 % 100 mL IVPB     3 g 100 mL/hr over 60 Minutes Intravenous Every 6 hours 06/23/15 1435     06/23/15 1800  linezolid (ZYVOX) tablet 600 mg     600 mg Per Tube Every 12 hours 06/23/15 1706     06/23/15 1700  colistimethate (COLYMYCIN) 300 mg in sodium chloride 0.9 % 100 mL IVPB     300 mg 208 mL/hr over 30 Minutes Intravenous  Once 06/23/15 1535 06/23/15 1746   06/23/15 1530  linezolid (ZYVOX) 100 MG/5ML suspension 600 mg  Status:  Discontinued     600 mg Oral Every 12 hours 06/23/15 1407 06/23/15 1419   06/23/15 1430  linezolid (ZYVOX) tablet 600 mg  Status:  Discontinued     600 mg Per Tube Every 12 hours 06/23/15 1419 06/23/15 1706   06/22/15 2000  amikacin (AMIKIN) 1,050 mg in dextrose 5 % 100 mL IVPB     1,050 mg 104.2 mL/hr over 60  Minutes Intravenous  Once 06/22/15 1608 06/22/15 2341   06/22/15 1900  vancomycin (VANCOCIN) 2,500 mg in sodium chloride 0.9 % 500 mL IVPB  Status:  Discontinued     2,500 mg 250 mL/hr over 120 Minutes Intravenous Every 48 hours 06/22/15 1837 06/23/15 1407   06/22/15 1800  Ampicillin-Sulbactam (UNASYN) 3 g in sodium chloride 0.9 % 100 mL IVPB  Status:  Discontinued     3 g 100 mL/hr over 60 Minutes Intravenous Every 8 hours 06/22/15 1608 06/23/15 1435   06/22/15 0900  [MAR Hold]  cefTAZidime (FORTAZ) 2 g in dextrose 5 % 50 mL IVPB  Status:  Discontinued     (MAR Hold since 06/22/15 1556)   2 g 100 mL/hr over 30 Minutes Intravenous Every 12 hours 06/22/15 0826 06/22/15 1609   06/22/15 0300  clindamycin (CLEOCIN) IVPB 900 mg  Status:  Discontinued     900 mg 100 mL/hr over 30 Minutes Intravenous Every 8 hours 06/22/15 0158 06/23/15 1407   06/22/15 0200  piperacillin-tazobactam (ZOSYN) IVPB 3.375 g  Status:  Discontinued     3.375 g 12.5 mL/hr over 240 Minutes Intravenous Every 8 hours 06/22/15 0158 06/22/15 0756      Medications:  Scheduled: .  ampicillin-sulbactam (UNASYN) IV  3 g Intravenous Q6H  . antiseptic oral rinse  7 mL Mouth Rinse BID  . colistimethate (COLISTIN) IV  70 mg Intravenous Q12H  . feeding supplement (PRO-STAT SUGAR FREE 64)  30 mL Oral BID  . [START ON 06/27/2015] hydrocortisone sod succinate (SOLU-CORTEF) inj  50 mg Intravenous Daily  . insulin aspart  1-3 Units Subcutaneous Q4H  . levothyroxine  50 mcg Oral QAC breakfast  . linezolid  600 mg Per Tube Q12H  . pantoprazole (PROTONIX) IV  40 mg Intravenous Q24H  . potassium chloride  10 mEq Intravenous Q1 Hr x 2  . sodium chloride flush  10-40 mL Intracatheter Q12H    Objective: Vital signs in last 24 hours: Temp:  [97.5 F (36.4 C)-99 F (37.2 C)] 99 F (37.2 C) (05/09 0729) Pulse Rate:  [94-141] 126 (05/09 0800) Resp:  [12-32] 13 (05/09 0800) BP: (81-121)/(25-91) 117/64 mmHg (05/09 0800) SpO2:  [98 %-100  %] 100 % (05/09 0800) FiO2 (%):  [30 %] 30 % (05/08 1342)   General appearance: alert, cooperative and no distress Resp: clear to auscultation bilaterally Cardio: regular rate and rhythm GI: normal findings: bowel sounds normal and soft, non-tender Extremities: massive LLE wounds, no gross d/c. some areas of questinoable viability of skin  Lab Results  Recent Labs  06/25/15 0545 06/26/15 0434  WBC 20.6* 20.6*  HGB 8.5* 7.7*  HCT 27.2* 25.3*  NA 142 146*  K 3.4* 3.3*  CL 105 111  CO2 23 23  BUN 51* 45*  CREATININE 2.14* 1.82*   Liver Panel  Recent Labs  06/24/15 0547 06/25/15 0545  PROT 4.6* 4.9*  ALBUMIN 1.7* 1.8*  AST 13* 9*  ALT 14 15  ALKPHOS 58 68  BILITOT 0.4 0.4   Sedimentation Rate No results for input(s): ESRSEDRATE in the last 72 hours. C-Reactive Protein No results for input(s): CRP in the last 72 hours.  Microbiology: Recent Results (from the past 240 hour(s))  Culture, blood (routine x 2)     Status: None   Collection Time: 06/20/15  5:11 PM  Result Value Ref Range Status   Specimen Description BLOOD RIGHT ASSIST CONTROL  Final   Special Requests BOTTLES DRAWN AEROBIC AND ANAEROBIC  5CC  Final   Culture NO GROWTH 5 DAYS  Final   Report Status 06/25/2015 FINAL  Final  Culture, blood (routine x 2)     Status: None   Collection Time: 06/20/15  5:11 PM  Result Value Ref Range Status   Specimen Description BLOOD LEFT ASSIST CONTROL  Final   Special Requests BOTTLES DRAWN AEROBIC AND ANAEROBIC  5CC  Final   Culture NO GROWTH 5 DAYS  Final   Report Status 06/25/2015 FINAL  Final  Wound culture     Status: None (Preliminary result)   Collection Time: 06/20/15  5:11 PM  Result Value Ref Range Status   Specimen Description WOUND  Final   Special Requests Normal  Final   Gram Stain   Final    FEW WBC SEEN MANY GRAM NEGATIVE RODS FEW GRAM POSITIVE COCCI    Culture   Final    HEAVY GROWTH GRAM NEGATIVE RODS HEAVY GROWTH PROVIDENCIA STUARTII     Report Status PENDING  Incomplete   Organism ID, Bacteria PROVIDENCIA STUARTII  Final      Susceptibility   Providencia stuartii - MIC*    AMPICILLIN 16 RESISTANT Resistant     CEFAZOLIN 16 RESISTANT Resistant     CEFEPIME <=  1 SENSITIVE Sensitive     CEFTAZIDIME <=1 SENSITIVE Sensitive     CEFTRIAXONE <=1 SENSITIVE Sensitive     CIPROFLOXACIN >=4 RESISTANT Resistant     GENTAMICIN 2 RESISTANT Resistant     IMIPENEM 2 SENSITIVE Sensitive     TRIMETH/SULFA >=320 RESISTANT Resistant     AMPICILLIN/SULBACTAM 4 SENSITIVE Sensitive     PIP/TAZO <=4 SENSITIVE Sensitive     * HEAVY GROWTH PROVIDENCIA STUARTII  Surgical PCR screen     Status: None   Collection Time: 06/20/15 10:10 PM  Result Value Ref Range Status   MRSA, PCR NEGATIVE NEGATIVE Final   Staphylococcus aureus NEGATIVE NEGATIVE Final    Comment:        The Xpert SA Assay (FDA approved for NASAL specimens in patients over 73 years of age), is one component of a comprehensive surveillance program.  Test performance has been validated by [facility] for patients greater than or equal to 6 year old. It is not intended to diagnose infection nor to guide or monitor treatment.   Culture, blood (Routine X 2) w Reflex to ID Panel     Status: None (Preliminary result)   Collection Time: 06/22/15  2:35 AM  Result Value Ref Range Status   Specimen Description BLOOD RIGHT ANTECUBITAL  Final   Special Requests BOTTLES DRAWN AEROBIC ONLY 10CC  Final   Culture NO GROWTH 3 DAYS  Final   Report Status PENDING  Incomplete  Wound culture     Status: None   Collection Time: 06/22/15  3:04 AM  Result Value Ref Range Status   Specimen Description WOUND  Final   Special Requests LEFT LEG  Final   Gram Stain   Final    FEW WBC PRESENT, PREDOMINANTLY PMN NO SQUAMOUS EPITHELIAL CELLS SEEN MODERATE GRAM NEGATIVE COCCOBACILLI Performed at [facility]    Culture   Final    ABUNDANT ACINETOBACTER CALCOACETICUS/BAUMANNII  COMPLEX Performed at [facility]    Report Status 06/25/2015 FINAL  Final   Organism ID, Bacteria ACINETOBACTER CALCOACETICUS/BAUMANNII COMPLEX  Final      Susceptibility   Acinetobacter calcoaceticus/baumannii complex - MIC*    CEFTAZIDIME >=64 RESISTANT Resistant     CIPROFLOXACIN >=4 RESISTANT Resistant     GENTAMICIN >=16 RESISTANT Resistant     IMIPENEM 2 SENSITIVE Sensitive     PIP/TAZO 16 SENSITIVE Sensitive     TOBRAMYCIN >=16 RESISTANT Resistant     * ABUNDANT ACINETOBACTER CALCOACETICUS/BAUMANNII COMPLEX  Culture, blood (Routine X 2) w Reflex to ID Panel     Status: None (Preliminary result)   Collection Time: 06/22/15  8:00 AM  Result Value Ref Range Status   Specimen Description BLOOD RIGHT ARM  Final   Special Requests BOTTLES DRAWN AEROBIC ONLY 7CC  Final   Culture NO GROWTH 3 DAYS  Final   Report Status PENDING  Incomplete  Wound culture     Status: None   Collection Time: 06/22/15  2:56 PM  Result Value Ref Range Status   Specimen Description WOUND LEFT LEG  Final   Special Requests NONE  Final   Gram Stain   Final    ABUNDANT WBC PRESENT, PREDOMINANTLY PMN NO SQUAMOUS EPITHELIAL CELLS SEEN NO ORGANISMS SEEN Performed at [facility]    Culture   Final    NO GROWTH 2 DAYS Performed at [facility]    Report Status 06/25/2015 FINAL  Final  Anaerobic culture     Status: None (Preliminary result)  Collection Time: 06/22/15  6:12 PM  Result Value Ref Range Status   Specimen Description WOUND LEFT LEG  Final   Special Requests PATIENT ON FOLLOWING UNASYN FORTAZ CLEOSIN ZOSYN  Final   Gram Stain   Final    RARE WBC PRESENT, PREDOMINANTLY PMN RARE SQUAMOUS EPITHELIAL CELLS PRESENT ABUNDANT GRAM NEGATIVE RODS [facility]    Culture   Final    NO ANAEROBES ISOLATED; CULTURE IN PROGRESS FOR 5 DAYS [facility] at [facility]    Report Status PENDING  Incomplete  Wound culture     Status: None    Collection Time: 06/22/15  6:12 PM  Result Value Ref Range Status   Specimen Description WOUND LEFT LEG  Final   Special Requests PATIENT ON FOLLOWING UNASYN FORTAZ CLEOCIN ZOSYN  Final   Gram Stain   Final    RARE WBC PRESENT, PREDOMINANTLY PMN RARE SQUAMOUS EPITHELIAL CELLS PRESENT ABUNDANT GRAM NEGATIVE RODS [facility]    Culture   Final    MULTIPLE ORGANISMS PRESENT, NONE PREDOMINANT Note: NO STAPHYLOCOCCUS AUREUS ISOLATED NO GROUP A STREP (S.PYOGENES) ISOLATED [facility]formed at [facility]    Report Status 06/25/2015 FINAL  Final    Studies/Results: Dg Chest Port 1 View  06/25/2015  CLINICAL DATA:  Pulmonary edema, shortness of breath, intubated patient EXAM: PORTABLE CHEST 1 VIEW COMPARISON:  Portable chest x-ray of Jun 24, 2015 FINDINGS: The lungs are borderline hypoinflated. The interstitial markings are less prominent today. There is no alveolar infiltrate, pleural effusion, or pneumothorax. The cardiac silhouette remains enlarged. The pulmonary vascularity remains prominent centrally. The endotracheal tube tip lies 3.6 cm above the carina. The esophagogastric tube tip projects below the inferior margin of the image. The right internal jugular venous catheter tip projects over the posterior aspect of the right second rib. IMPRESSION: Cardiomegaly and mild central pulmonary vascular congestion with decreased pulmonary interstitial edema. The right internal jugular venous catheter has apparently been partially withdrawn and now its tip projects over the inferior aspect of the inferior vena cava at the level of the posterior second rib. The other support tubes are in stable position. Electronically Signed   By: David  Swazil[place] M.D.   On: 06/25/2015 07:25     Assessment/Plan: LLE Necrotizing Fascitis Hx of Acinetobacter bacteremia (05-2015) Morbid Obesity CRI Severe protein calorie malnutrition  Wound Cx 5-3 (P stuartii) and 5-5 (Acinetobacter- S- imipenem)   Cr slightly better today. Assume dysfxn due to colisitin, and her prev doses of amikacin.  Stop unsayn and colistin, change to imipenem Stop zyvox today  Getting hydrotherapy nutrition Antbx day: 4 Zyvox, unasyn, colistin         Michele SaxJeffrey Eliodoro Weber Infectious Diseases (pager) [phone] [email or web address] 06/26/2015, 9:37 AM  LOS: 4 days

## 2015-06-27 DIAGNOSIS — I4891 Unspecified atrial fibrillation: Secondary | ICD-10-CM

## 2015-06-27 DIAGNOSIS — Z8619 Personal history of other infectious and parasitic diseases: Secondary | ICD-10-CM

## 2015-06-27 LAB — BASIC METABOLIC PANEL
Anion gap: 13 (ref 5–15)
BUN: 49 mg/dL — AB (ref 6–20)
CALCIUM: 8.1 mg/dL — AB (ref 8.9–10.3)
CO2: 24 mmol/L (ref 22–32)
CREATININE: 2.07 mg/dL — AB (ref 0.44–1.00)
Chloride: 105 mmol/L (ref 101–111)
GFR calc Af Amer: 32 mL/min — ABNORMAL LOW (ref >60)
GFR calc non Af Amer: 28 mL/min — ABNORMAL LOW (ref >60)
Glucose, Bld: 94 mg/dL (ref 65–99)
Potassium: 3.5 mmol/L (ref 3.5–5.1)
Sodium: 142 mmol/L (ref 135–145)

## 2015-06-27 LAB — ANAEROBIC CULTURE

## 2015-06-27 LAB — HEPARIN LEVEL (UNFRACTIONATED)
HEPARIN UNFRACTIONATED: 0.42 [IU]/mL (ref 0.30–0.70)
Heparin Unfractionated: 0.54 IU/mL (ref 0.30–0.70)

## 2015-06-27 LAB — CULTURE, BLOOD (ROUTINE X 2)
CULTURE: NO GROWTH
Culture: NO GROWTH

## 2015-06-27 LAB — WOUND CULTURE: SPECIAL REQUESTS: NORMAL

## 2015-06-27 LAB — GLUCOSE, CAPILLARY
GLUCOSE-CAPILLARY: 90 mg/dL (ref 65–99)
GLUCOSE-CAPILLARY: 95 mg/dL (ref 65–99)
GLUCOSE-CAPILLARY: 123 mg/dL — AB (ref 65–99)
Glucose-Capillary: 126 mg/dL — ABNORMAL HIGH (ref 65–99)
Glucose-Capillary: 95 mg/dL (ref 65–99)
Glucose-Capillary: 98 mg/dL (ref 65–99)

## 2015-06-27 LAB — CBC
HCT: 25.8 % — ABNORMAL LOW (ref 36.0–46.0)
Hemoglobin: 8 g/dL — ABNORMAL LOW (ref 12.0–15.0)
MCH: 31 pg (ref 26.0–34.0)
MCHC: 31 g/dL (ref 30.0–36.0)
MCV: 100 fL (ref 78.0–100.0)
PLATELETS: 190 10*3/uL (ref 150–400)
RBC: 2.58 MIL/uL — ABNORMAL LOW (ref 3.87–5.11)
RDW: 18.9 % — AB (ref 11.5–15.5)
WBC: 20.9 10*3/uL — ABNORMAL HIGH (ref 4.0–10.5)

## 2015-06-27 MED ORDER — ENSURE ENLIVE PO LIQD
237.0000 mL | Freq: Two times a day (BID) | ORAL | Status: DC
  Administered 2015-06-27 – 2015-06-29 (×3): 237 mL via ORAL

## 2015-06-27 MED ORDER — TOPIRAMATE 25 MG PO TABS
50.0000 mg | ORAL_TABLET | Freq: Two times a day (BID) | ORAL | Status: DC
  Administered 2015-06-27 – 2015-06-28 (×4): 50 mg via ORAL
  Administered 2015-06-29: 25 mg via ORAL
  Administered 2015-06-29: 50 mg via ORAL
  Filled 2015-06-27 (×7): qty 2

## 2015-06-27 MED ORDER — ADULT MULTIVITAMIN W/MINERALS CH
1.0000 | ORAL_TABLET | Freq: Every day | ORAL | Status: DC
  Administered 2015-06-27 – 2015-06-29 (×3): 1 via ORAL
  Filled 2015-06-27 (×3): qty 1

## 2015-06-27 NOTE — Progress Notes (Signed)
Physical Therapy Wound Treatment Patient Details  Name: Michele Weber MRN: [id number] Date of Birth: 1970/12/19  Today's Date: 06/27/2015 Time: 9030-0923 Time Calculation (min): 45 min  Subjective  Subjective: Pt pleasant and agreeable to hydrotherapy Patient and Family Stated Goals: heal wounds Date of Onset: 05/20/15 Prior Treatments: Prior I&D per pt  Pain Score: Pain Score: 2   Wound Assessment  Pressure Ulcer 08/11/13 scab right upper thigh small open area 2x22 gauze dsg applied (Active)     Pressure Ulcer 09/30/13 Stage II -  Partial thickness loss of dermis presenting as a shallow open ulcer with a red, pink wound bed without slough. (Active)     Pressure Ulcer 09/30/13 Stage II -  Partial thickness loss of dermis presenting as a shallow open ulcer with a red, pink wound bed without slough. (Active)     Wound / Incision (Open or Dehisced) 08/11/13 Leg Left (Active)  Dressing Type Impregnated gauze (petrolatum) 06/27/2015  4:00 AM  Dressing Changed Changed 06/27/2015  6:00 AM  Dressing Status Dry;Clean;Intact 06/26/2015  4:00 AM  Dressing Change Frequency Daily 06/26/2015  4:00 AM  Site / Wound Assessment Bleeding;Painful 06/07/2015  8:30 PM  Drainage Amount Minimal 06/07/2015  8:30 PM  Drainage Description Serosanguineous 06/07/2015  8:30 PM     Wound / Incision (Open or Dehisced) Other (Comment) Leg Right;Left cellulitis  (Active)  Dressing Type Impregnated gauze (petrolatum) 06/27/2015  4:00 AM  Dressing Changed Changed 06/27/2015  6:00 AM  Dressing Status Clean;Dry;Intact 06/07/2015  8:30 PM  Dressing Change Frequency Daily 06/07/2015  8:30 PM  Site / Wound Assessment Painful;Bleeding 06/07/2015  8:30 PM  % Wound base Red or Granulating 50% 05/31/2015  2:39 AM  % Wound base Yellow 50% 05/31/2015  2:39 AM  Peri-wound Assessment Induration 05/31/2015  2:39 AM  Margins Unattached edges (unapproximated) 06/01/2015 11:00 PM  Closure None 06/01/2015 11:00 PM  Drainage Amount Minimal  06/07/2015  8:30 PM  Drainage Description Serosanguineous 06/07/2015  8:30 PM  Treatment Cleansed;Other (Comment) 06/01/2015 12:15 AM     Wound / Incision (Open or Dehisced) Hip Left (Active)  Dressing Type Gauze (Comment) 06/27/2015  4:00 AM  Dressing Changed Changed 06/27/2015  6:00 AM  Dressing Status Clean;Dry;Intact 06/26/2015  4:00 AM  Dressing Change Frequency Daily 06/26/2015  4:00 AM  Site / Wound Assessment Granulation tissue 06/25/2015  4:00 AM  % Wound base Red or Granulating 100% 06/25/2015  4:00 AM  % Wound base Yellow 90% 05/31/2015  2:39 AM  % Wound base Black 0% 05/30/2015  6:00 PM  Peri-wound Assessment Induration 05/31/2015  2:39 AM  Margins Unattached edges (unapproximated) 06/01/2015 11:00 PM  Closure None 06/01/2015 11:00 PM  Drainage Amount Minimal 06/07/2015  8:30 PM  Drainage Description Serosanguineous 06/07/2015  8:30 PM  Treatment Cleansed;Other (Comment) 06/01/2015 11:00 PM     Wound / Incision (Open or Dehisced) 06/20/15 Leg large open weeping wounds extending from left thigh to left foot (Active)  Dressing Type ABD 06/27/2015  4:00 AM  Dressing Changed Changed 06/27/2015  6:00 AM  Dressing Status Intact 06/26/2015  6:00 AM  Dressing Change Frequency Twice a day 06/26/2015  6:00 AM  Site / Wound Assessment Dressing in place / Unable to assess 06/26/2015  4:00 AM  % Wound base Red or Granulating 25% 06/22/2015 12:00 PM  % Wound base Yellow 25% 06/22/2015 12:00 PM  % Wound base Black 50% 06/22/2015 12:00 PM  Peri-wound Assessment Intact 06/22/2015 12:00 PM  Margins Unattached edges (unapproximated) 06/22/2015 12:00  PM  Closure None 06/22/2015 12:00 PM  Drainage Amount Moderate 06/25/2015 12:00 PM  Drainage Description Purulent;Serosanguineous 06/25/2015 12:00 PM     Wound / Incision (Open or Dehisced) 06/20/15 Foot Left large weeping wound on top of left foot (Active)  Dressing Type ABD 06/27/2015 12:00 PM  Dressing Changed Changed 06/27/2015 12:00 PM  Dressing Status Clean;Dry;Intact 06/27/2015  12:00 PM  Dressing Change Frequency Twice a day 06/27/2015 12:00 PM  Site / Wound Assessment Dressing in place / Unable to assess 06/27/2015 12:00 PM  % Wound base Red or Granulating 90% 06/27/2015 12:00 PM  % Wound base Yellow 5% 06/27/2015 12:00 PM  % Wound base Black 5% 06/27/2015 12:00 PM  Peri-wound Assessment Intact 06/27/2015 12:00 PM  Wound Length (cm) 9 cm 06/26/2015  2:53 PM  Wound Width (cm) 11 cm 06/26/2015  2:53 PM  Wound Depth (cm) 1 cm 06/26/2015  2:53 PM  Margins Unattached edges (unapproximated) 06/27/2015 12:00 PM  Closure None 06/27/2015 12:00 PM  Drainage Amount Minimal 06/27/2015 12:00 PM  Drainage Description Purulent 06/27/2015 12:00 PM  Treatment Cleansed;Packing (Saline gauze) 06/27/2015 12:00 PM     Wound / Incision (Open or Dehisced) 06/20/15 Other (Comment) Thigh Right large wound inner thing (Active)  Dressing Type ABD 06/27/2015  4:00 AM  Dressing Changed Changed 06/27/2015  6:00 AM  Dressing Status Dry;Intact 06/26/2015  6:00 AM  Dressing Change Frequency PRN 06/26/2015  4:00 AM  Site / Wound Assessment Granulation tissue;Pink;Red 06/25/2015  4:00 AM  % Wound base Red or Granulating 100% 06/25/2015  4:00 AM  % Wound base Yellow 5% 06/22/2015 12:00 PM  % Wound base Black 5% 06/22/2015 12:00 PM  Wound Length (cm) 15 cm 06/22/2015 12:30 AM  Wound Width (cm) 10 cm 06/22/2015 12:30 AM     Wound / Incision (Open or Dehisced) 06/26/15 Other (Comment) Thigh Left Superior medial (Active)  Dressing Type ABD;Gauze (Comment);Moist to dry;Barrier Film (skin prep) 06/27/2015 12:00 PM  Dressing Changed Changed 06/27/2015 12:00 PM  Dressing Status Clean;Dry;Intact 06/27/2015 12:00 PM  Dressing Change Frequency Daily 06/27/2015 12:00 PM  Site / Wound Assessment Pink;Brown;Yellow 06/27/2015 12:00 PM  % Wound base Red or Granulating 30% 06/27/2015 12:00 PM  % Wound base Yellow 20% 06/27/2015 12:00 PM  % Wound base Black 5% 06/27/2015 12:00 PM  % Wound base Other (Comment) 45% 06/27/2015 12:00 PM  Peri-wound  Assessment Intact 06/27/2015 12:00 PM  Wound Length (cm) 15 cm 06/26/2015  2:53 PM  Wound Width (cm) 30 cm 06/26/2015  2:53 PM  Wound Depth (cm) 4.5 cm 06/26/2015  2:53 PM  Undermining (cm) 4.1 at 11 o'clock, 2.5 at 12 o'clock 06/26/2015  2:53 PM  Margins Unattached edges (unapproximated) 06/27/2015 12:00 PM  Closure None 06/27/2015 12:00 PM  Drainage Amount Minimal 06/27/2015 12:00 PM  Drainage Description Purulent 06/27/2015 12:00 PM  Treatment Cleansed;Debridement (Selective);Hydrotherapy (Pulse lavage);Packing (Saline gauze) 06/27/2015 12:00 PM     Wound / Incision (Open or Dehisced) 06/26/15 Other (Comment) Thigh Left Inferior medial (Active)  Dressing Type ABD;Gauze (Comment);Moist to dry;Barrier Film (skin prep) 06/27/2015 12:00 PM  Dressing Changed Changed 06/27/2015 12:00 PM  Dressing Status Clean;Dry;Intact 06/27/2015 12:00 PM  Dressing Change Frequency Daily 06/27/2015 12:00 PM  Site / Wound Assessment Brown;Yellow;Pink 06/27/2015 12:00 PM  % Wound base Red or Granulating 30% 06/27/2015 12:00 PM  % Wound base Yellow 20% 06/27/2015 12:00 PM  % Wound base Black 5% 06/27/2015 12:00 PM  % Wound base Other (Comment) 45% 06/27/2015 12:00 PM  Peri-wound  Assessment Intact 06/27/2015 12:00 PM  Wound Length (cm) 14 cm 06/26/2015  2:53 PM  Wound Width (cm) 38.5 cm 06/26/2015  2:53 PM  Wound Depth (cm) 6.5 cm 06/26/2015  2:53 PM  Margins Unattached edges (unapproximated) 06/27/2015 12:00 PM  Closure None 06/27/2015 12:00 PM  Drainage Amount Minimal 06/27/2015 12:00 PM  Drainage Description Purulent 06/27/2015 12:00 PM  Treatment Debridement (Selective);Hydrotherapy (Pulse lavage);Packing (Saline gauze) 06/27/2015 12:00 PM     Wound / Incision (Open or Dehisced) 06/26/15 Other (Comment) Thigh Left;Medial Calf (Active)  Dressing Type ABD;Gauze (Comment);Moist to dry;Barrier Film (skin prep) 06/27/2015 12:00 PM  Dressing Changed Changed 06/27/2015 12:00 PM  Dressing Status Clean;Dry;Intact 06/27/2015 12:00 PM  Dressing  Change Frequency Daily 06/27/2015 12:00 PM  Site / Wound Assessment Brown;Yellow;Pink 06/27/2015 12:00 PM  % Wound base Red or Granulating 15% 06/27/2015 12:00 PM  % Wound base Yellow 30% 06/27/2015 12:00 PM  % Wound base Black 5% 06/27/2015 12:00 PM  % Wound base Other (Comment) 50% 06/27/2015 12:00 PM  Peri-wound Assessment Intact 06/27/2015 12:00 PM  Wound Length (cm) 8.5 cm 06/26/2015  2:53 PM  Wound Width (cm) 12.5 cm 06/26/2015  2:53 PM  Wound Depth (cm) 5 cm 06/26/2015  2:53 PM  Margins Unattached edges (unapproximated) 06/27/2015 12:00 PM  Closure None 06/27/2015 12:00 PM  Drainage Amount Minimal 06/27/2015 12:00 PM  Drainage Description Purulent 06/27/2015 12:00 PM  Treatment Debridement (Selective);Hydrotherapy (Pulse lavage);Packing (Saline gauze) 06/27/2015 12:00 PM     Wound / Incision (Open or Dehisced) 06/26/15 Other (Comment) Thigh Left;Lateral (Active)  Dressing Type ABD;Gauze (Comment);Barrier Film (skin prep);Moist to dry 06/27/2015 12:00 PM  Dressing Changed Changed 06/27/2015 12:00 PM  Dressing Status Clean;Dry;Intact 06/27/2015 12:00 PM  Dressing Change Frequency Daily 06/27/2015 12:00 PM  Site / Wound Assessment Brown;Pink;Yellow 06/27/2015 12:00 PM  % Wound base Red or Granulating 50% 06/27/2015 12:00 PM  % Wound base Yellow 30% 06/27/2015 12:00 PM  % Wound base Black 10% 06/27/2015 12:00 PM  % Wound base Other (Comment) 10% 06/27/2015 12:00 PM  Wound Length (cm) 6.5 cm 06/26/2015  2:53 PM  Wound Width (cm) 8 cm 06/26/2015  2:53 PM  Wound Depth (cm) 7 cm 06/26/2015  2:53 PM  Margins Unattached edges (unapproximated) 06/27/2015 12:00 PM  Closure None 06/27/2015 12:00 PM  Drainage Amount Minimal 06/27/2015 12:00 PM  Drainage Description Purulent 06/27/2015 12:00 PM  Treatment Debridement (Selective);Hydrotherapy (Pulse lavage);Packing (Saline gauze) 06/27/2015 12:00 PM  Santyl applied to wound bed prior to applying dressing.  Hydrotherapy Pulsed lavage therapy - wound location: LLE  wounds Pulsed Lavage with Suction (psi): 4 psi (to 8 psi) Pulsed Lavage with Suction - Normal Saline Used: 1000 mL (x4) Pulsed Lavage Tip: Tip with splash shield Selective Debridement Selective Debridement - Location: LLE wounds Selective Debridement - Tools Used: Scissors;Forceps Selective Debridement - Tissue Removed: brown and yellow necrotic tissue   Wound Assessment and Plan  Wound Therapy - Assess/Plan/Recommendations Wound Therapy - Clinical Statement: Pt presents to hydrotherapy with multiple LLE wounds due to necrotizing fasciitis. Pt will benefit from hydrotherapy for selective removal of non-viable tissue and to decrease bioburden.  Wound Therapy - Functional Problem List: Decreased strength and AROM Factors Delaying/Impairing Wound Healing: Immobility;Multiple medical problems Hydrotherapy Plan: Debridement;Dressing change;Patient/family education;Pulsatile lavage with suction Wound Therapy - Frequency: 6X / week Wound Therapy - Follow Up Recommendations: Skilled nursing facility Wound Plan: See above  Wound Therapy Goals- Improve the function of patient's integumentary system by progressing the wound(s) through the phases of  wound healing (inflammation - proliferation - remodeling) by: Decrease Necrotic Tissue to: 25% Decrease Necrotic Tissue - Progress: Progressing toward goal Increase Granulation Tissue to: 75% Increase Granulation Tissue - Progress: Progressing toward goal Goals/treatment plan/discharge plan were made with and agreed upon by patient/family: Yes Wound Therapy - Potential for Goals: Good  Goals will be updated until maximal potential achieved or discharge criteria met.  Discharge criteria: when goals achieved, discharge from hospital, MD decision/surgical intervention, no progress towards goals, refusal/missing three consecutive treatments without notification or medical reason.  GP     Baileigh Modisette, Thornton Papas, [place] 06/27/2015, 12:37 PM

## 2015-06-27 NOTE — Progress Notes (Addendum)
Nutrition Follow-up  DOCUMENTATION CODES:   Morbid obesity  INTERVENTION:    Ensure Enlive po BID, each supplement provides 350 kcal and 20 grams of protein  MVI daily  NUTRITION DIAGNOSIS:   Increased nutrient needs related to wound healing as evidenced by estimated needs.  Ongoing  GOAL:   Patient will meet greater than or equal to 90% of their needs  Unmet  MONITOR:   PO intake, Supplement acceptance, Labs, I & O's  ASSESSMENT:   45 year old woman morbidly obese (bed bound x 2 years) transferred from [facility] where she was admitted on 1953 for cellulitis of her left thigh and nonhealing wounds-[facility]. She was hypotensive and required pressors prior to transfer. Admitted with necrotizing soft tissue infection to LE.  S/P sharp incision/ debridement/ irrigation of skin, subcutaneous fat, muscle - left lower extremity on 5/5. Received TF while intubated. She was extubated on 5/8. Diet has been advanced to heart healthy, CHO modified. Patient is receiving hydrotherapy to left leg. Patient with increased protein needs to support wound healing; wound benefit from PO supplements to maximize intake.   Diet Order:  Diet heart healthy/carb modified Room service appropriate?: Yes; Fluid consistency:: Thin  Skin:  Wound (see comment) (Bil legs open weeping wounds/cellulitis; Left hip wound)  Last BM:  5/10  Height:   Ht Readings from Last 1 Encounters:  06/22/15 5' (1.524 m)    Weight:   Wt Readings from Last 1 Encounters:  06/25/15 593 lb (268.983 kg)    Ideal Body Weight:  45.5 kg  BMI:  Body mass index is 115.81 kg/(m^2).  Estimated Nutritional Needs:   Kcal:  2500  Protein:  150 gm  Fluid:  2.5 L  EDUCATION NEEDS:   No education needs identified at this time  Joaquin CourtsKimberly Juan Olthoff, RD, LDN, CNSC Pager [phone] After Hours Pager [phone]

## 2015-06-27 NOTE — Progress Notes (Addendum)
PULMONARY / CRITICAL CARE MEDICINE   Name: Aimee Heldman MRN: [id number] DOB: Oct 03, 1970    ADMISSION DATE:  06/22/2015 CONSULTATION DATE:  06/22/15  REFERRING MD:  TRH at [facility]  CHIEF COMPLAINT:  Cellulitis LLE  HISTORY OF PRESENT ILLNESS:  Verle Brillhart is a 45 y.o. female with PMH as outlined below including super morbid obesity.  She had recent admission to [facility] for gram negative bacteremia (acinetobacter) and was discharged to rehab facility.  She then was admitted to [facility] on 06/20/15 for cellulitis of the left lower extremity / non-healing wounds.  At [facility] she was evaluated by surgery who recommended surgical debridement; however, anesthesia services at [facility] felt that she would be better served at tertiary care center. She was subsequently transferred to [facility] for further evaluation and management including probable surgical I&D.  SUBJECTIVE:  Fib rvr on amio gtt afebrile  VITAL SIGNS: BP 97/74 mmHg  Pulse 156  Temp(Src) 98 F (36.7 C) (Oral)  Resp 28  Ht 5' (1.524 m)  Wt 593 lb (268.983 kg)  BMI 115.81 kg/m2  SpO2 97%  LMP 05/28/2015  HEMODYNAMICS: CVP:  [5 mmHg] 5 mmHg  VENTILATOR SETTINGS:    INTAKE / OUTPUT: I/O last 3 completed shifts: In: 2517.6 [I.V.:1616.7; IV Piggyback:900.9] Out: 1430 [Urine:1430]   PHYSICAL EXAMINATION: General: Super morbid obese female Neuro: Alert, appropriate, moves all ext uppers HEENT: PERRL Cardiovascular:s1 s2 irt distant Lungs: reduced BL Abdomen: Super morbidly obese.  BS none, no r/g Musculoskeletal: Massive lymphedema. Skin: Extensive wound to LLE that is very malodorous, R thigh wound  LABS:  BMET  Recent Labs Lab 06/25/15 0545 06/26/15 0434 06/27/15 0447  NA 142 146* 142  K 3.4* 3.3* 3.5  CL 105 111 105  CO2 BUN 51* 45* 49*  CREATININE 2.14* 1.82* 2.07*  GLUCOSE 140* 112* 94    Electrolytes  Recent Labs Lab 06/22/15 0507 06/23/15 0514 06/23/15 2314  06/25/15 0545 06/26/15 0434  06/27/15 0447  CALCIUM 8.0* 7.6* 7.3*  < > 7.7* 7.3* 8.1*  MG 1.6* 1.6* 1.4*  --   --   --   --   PHOS 5.9* 4.8*  --   --   --   --   --   < > = values in this interval not displayed.  CBC  Recent Labs Lab 06/25/15 0545 06/26/15 0434 06/27/15 0447  WBC 20.6* 20.6* 20.9*  HGB 8.5* 7.7* 8.0*  HCT 27.2* 25.3* 25.8*  PLT 261 208 190    Coag's  Recent Labs Lab 06/22/15 0915 06/22/15 1521 06/22/15 2100  INR 3.34* 1.80* 1.43    Sepsis Markers  Recent Labs Lab 06/20/15 1711 06/22/15 0448  LATICACIDVEN 2.0 1.4    ABG  Recent Labs Lab 06/22/15 1649 06/22/15 2044 06/23/15 1021  PHART 7.348* 7.306* 7.321*  PCO2ART 39.2 38.5 35.7  PO2ART 561.0* 210* 181*    Liver Enzymes  Recent Labs Lab 06/20/15 1711 06/24/15 0547 06/25/15 0545  AST 15 13* 9*  ALT ALKPHOS 69 58 68  BILITOT 0.6 0.4 0.4  ALBUMIN 2.9* 1.7* 1.8*    Cardiac Enzymes No results for input(s): TROPONINI, PROBNP in the last 168 hours.  Glucose  Recent Labs Lab 06/26/15 1144 06/26/15 1528 06/26/15 1945 06/26/15 2341 06/27/15 0335 06/27/15 0731  GLUCAP 104* 110* 119* 95 90 98    Imaging No results found.   STUDIES:   CULTURES: Blood 05/05 > Wound 05/03>(P stuartii) Wound 05/05 >(Acinetobacter- S- imipenem)  [facility] wound gram ne rod>>>GNR >>  ANTIBIOTICS: Vanc 05/05 >05/07 Amikacin 5/5>>>05/07 Clindamycin 05/05 >05/06 Unasyn 5/5>>>5/9 Imipenem  05/09>>  SIGNIFICANT EVENTS: 05/05 > admitted with presumed necrotizing fasciitis of LLE, to OR 5/6- fib, rvr, lower pressors 5/8- Off pressors/ remains on amio  LINES/TUBES: RIJ PICC 5/4  DISCUSSION: 45 y.o. super morbidly obese F admitted 05/05 with presumed nec fasc of LLE.  Was [facility]D.  ASSESSMENT / PLAN:  INFECTIOUS A:   Shock -( resolving) presumed septic due to LLE necrotizing fasciitis.  Of note, had MDR acinetobacter calcoaceticus/baumannii during admission in April  2017. P:   Hydrotherapy with wound debridement 5/9  ABX per ID Prognosis is guarded with these organisms   PULMONARY A: Acute resp failure ( resolved) Traumatic intubation IN OR : 05/05 OSA / OHS Int prominence / edema from fib rvr P:   Monitor Sao2's Noct CPAP Mobilize when able   CARDIOVASCULAR A:  Shock - presumed septic due to LLE necrotizing fasciitis.>> resolving>> off pressors Hx A.fib, dCHF. New fib rvr>> Amio Gtt Rel AI P:  Amio gtt  heparin IV  - pharmacy managing / Monitor for bleeding post debridement     RENAL A:  Hypokalemia  Hypernatremia AoCKD. AG and NONAG ATN, AKI-Creatinine slight rise P:   Maintain even balance Replete electrolytes as needed Chem daily    GASTROINTESTINAL A:   Super morbid obesity. GERD.  P:   Pantoprazole. Heart Healthy/ carb modified diet  HEMATOLOGIC A:   VTE Prophylaxis. Heparin for fib Anemia P:  SCD's unable to fit around leg IV Heparin. Monitor for bleeding from wound post debridement CBC daily/ trend HGB Transfuse for HGB <7  ENDOCRINE A:   Hypothyroidism.  tsh - 8.8 P:   Continue outpatient synthroid  po , get tsh as outpt in 1 month   NEUROLOGIC A:   Pain P:   Ct  fent prn  For dressing changes Goal RASS 0 Resume topamax   Family updated: daughter & sister at bedside 5/10  Interdisciplinary Family Meeting v Palliative Care Meeting:  Due by: 06/28/15.   Tr to SDU & to [facility] 5/11 Explore LTAC option but will need clearance from surgery  Cyril Mourningakesh Merced Brougham MD. FCCP. [facility] Pager [phone] If no response call [phone]   06/27/2015

## 2015-06-27 NOTE — Progress Notes (Signed)
5 Days Post-Op  Subjective: Pt doing well today.  tol Hydrotx yesterday  Objective: Vital signs in last 24 hours: Temp:  [97.6 F (36.4 C)-98.3 F (36.8 C)] 98 F (36.7 C) (05/10 0735) Pulse Rate:  [113-141] 141 (05/10 0500) Resp:  [8-33] 8 (05/10 0600) BP: (88-120)/(47-81) 92/67 mmHg (05/10 0600) SpO2:  [96 %-100 %] 96 % (05/10 0500) Last BM Date: 06/26/15  Intake/Output from previous day: 05/09 0701 - 05/10 0700 In: 1591.6 [I.V.:991.6; IV Piggyback:600] Out: 655 [Urine:655] Intake/Output this shift:    General appearance: alert and cooperative GI: soft, non-tender; bowel sounds normal; no masses,  no organomegaly Incision/Wound: wounds wrapped, hemostatic, fat appears viable  Lab Results:   Recent Labs  06/26/15 0434 06/27/15 0447  WBC 20.6* 20.9*  HGB 7.7* 8.0*  HCT 25.3* 25.8*  PLT 208 190   BMET  Recent Labs  06/26/15 0434 06/27/15 0447  NA 146* 142  K 3.3* 3.5  CL 111 105  CO2 23 24  GLUCOSE 112* 94  BUN 45* 49*  CREATININE 1.82* 2.07*  CALCIUM 7.3* 8.1*   PT/INR No results for input(s): LABPROT, INR in the last 72 hours. ABG No results for input(s): PHART, HCO3 in the last 72 hours.  Invalid input(s): PCO2, PO2  Studies/Results: No results found.  Anti-infectives: Anti-infectives    Start     Dose/Rate Route Frequency Ordered Stop   06/26/15 1800  imipenem-cilastatin (PRIMAXIN) 500 mg in sodium chloride 0.9 % 100 mL IVPB     500 mg 200 mL/hr over 30 Minutes Intravenous Every 6 hours 06/26/15 1021     06/26/15 1015  imipenem-cilastatin (PRIMAXIN) 500 mg in sodium chloride 0.9 % 100 mL IVPB     500 mg 200 mL/hr over 30 Minutes Intravenous  Once 06/26/15 1011 06/26/15 1239   06/25/15 0500  colistimethate (COLYMYCIN) 200 mg in sodium chloride 0.9 % 100 mL IVPB  Status:  Discontinued     200 mg 205.3 mL/hr over 30 Minutes Intravenous Every 36 hours 06/23/15 1535 06/24/15 2236   06/25/15 0500  colistimethate (COLYMYCIN) 70 mg in sodium  chloride 0.9 % 100 mL IVPB  Status:  Discontinued     70 mg 201.9 mL/hr over 30 Minutes Intravenous Every 12 hours 06/24/15 2236 06/26/15 0958   06/24/15 2000  amikacin (AMIKIN) 1,000 mg in dextrose 5 % 100 mL IVPB  Status:  Discontinued     1,000 mg 104 mL/hr over 60 Minutes Intravenous Every 48 hours 06/22/15 1608 06/23/15 1417   06/23/15 1800  Ampicillin-Sulbactam (UNASYN) 3 g in sodium chloride 0.9 % 100 mL IVPB  Status:  Discontinued     3 g 100 mL/hr over 60 Minutes Intravenous Every 6 hours 06/23/15 1435 06/26/15 0958   06/23/15 1800  linezolid (ZYVOX) tablet 600 mg  Status:  Discontinued     600 mg Per Tube Every 12 hours 06/23/15 1706 06/26/15 0958   06/23/15 1700  colistimethate (COLYMYCIN) 300 mg in sodium chloride 0.9 % 100 mL IVPB     300 mg 208 mL/hr over 30 Minutes Intravenous  Once 06/23/15 1535 06/23/15 1746   06/23/15 1530  linezolid (ZYVOX) 100 MG/5ML suspension 600 mg  Status:  Discontinued     600 mg Oral Every 12 hours 06/23/15 1407 06/23/15 1419   06/23/15 1430  linezolid (ZYVOX) tablet 600 mg  Status:  Discontinued     600 mg Per Tube Every 12 hours 06/23/15 1419 06/23/15 1706   06/22/15 2000  amikacin (AMIKIN) 1,050  mg in dextrose 5 % 100 mL IVPB     1,050 mg 104.2 mL/hr over 60 Minutes Intravenous  Once 06/22/15 1608 06/22/15 2341   06/22/15 1900  vancomycin (VANCOCIN) 2,500 mg in sodium chloride 0.9 % 500 mL IVPB  Status:  Discontinued     2,500 mg 250 mL/hr over 120 Minutes Intravenous Every 48 hours 06/22/15 1837 06/23/15 1407   06/22/15 1800  Ampicillin-Sulbactam (UNASYN) 3 g in sodium chloride 0.9 % 100 mL IVPB  Status:  Discontinued     3 g 100 mL/hr over 60 Minutes Intravenous Every 8 hours 06/22/15 1608 06/23/15 1435   06/22/15 0900  [MAR Hold]  cefTAZidime (FORTAZ) 2 g in dextrose 5 % 50 mL IVPB  Status:  Discontinued     (MAR Hold since 06/22/15 1556)   2 g 100 mL/hr over 30 Minutes Intravenous Every 12 hours 06/22/15 0826 06/22/15 1609   06/22/15  0300  clindamycin (CLEOCIN) IVPB 900 mg  Status:  Discontinued     900 mg 100 mL/hr over 30 Minutes Intravenous Every 8 hours 06/22/15 0158 06/23/15 1407   06/22/15 0200  piperacillin-tazobactam (ZOSYN) IVPB 3.375 g  Status:  Discontinued     3.375 g 12.5 mL/hr over 240 Minutes Intravenous Every 8 hours 06/22/15 0158 06/22/15 0756      Assessment/Plan: s/p Procedure(s): DEBRIDEMENT LEFT LEG INFECTION (Left) -Con't with Hydrotx wounds appear to be clearing up well -Mobilize as tol- -Con't abx   LOS: 5 days    Marigene Ehlers., Jed Limerick 06/27/2015

## 2015-06-27 NOTE — Progress Notes (Signed)
ANTICOAGULATION CONSULT NOTE - Follow Up Consult  Pharmacy Consult for heparin Indication: atrial fibrillation  Labs:  Recent Labs  06/25/15 0545  06/26/15 0434 06/26/15 1100 06/26/15 2023 06/27/15 0447  HGB 8.5*  --  7.7*  --   --  8.0*  HCT 27.2*  --  25.3*  --   --  25.8*  PLT 261  --  208  --   --  190  HEPARINUNFRC  --   < > 0.86* 0.77* 0.83* 0.54  CREATININE 2.14*  --  1.82*  --   --  2.07*  < > = values in this interval not displayed.   Assessment/Plan:  45yo female therapeutic on heparin after rate changes. Will continue gtt at current rate and confirm stable with additional level.   Vernard GamblesVeronda Frederik Standley, PharmD, BCPS  06/27/2015,5:29 AM

## 2015-06-27 NOTE — Progress Notes (Signed)
Received patient. CHG completed. No c/o voiced; CCMD notified.

## 2015-06-27 NOTE — Progress Notes (Signed)
ANTICOAGULATION CONSULT NOTE - Follow up Consult  Pharmacy Consult:  Heparin Indication: atrial fibrillation   No Known Allergies  Patient Measurements: Height: 5' (152.4 cm) Weight: (!) 593 lb (268.983 kg) IBW/kg (Calculated) : 45.5 Heparin Dosing Weight: 122 kg  Vital Signs: Temp: 98.6 F (37 C) (05/10 1517) Temp Source: Oral (05/10 1517) BP: 103/81 mmHg (05/10 1500) Pulse Rate: 115 (05/10 1500)  Labs:  Recent Labs  06/25/15 0545  06/26/15 0434  06/27/15 0447 06/27/15 1320 06/27/15 1450  HGB 8.5*  --  7.7*  --  8.0*  --   --   HCT 27.2*  --  25.3*  --  25.8*  --   --   PLT 261  --  208  --  190  --   --   HEPARINUNFRC  --   < > 0.86*  < > 0.54 >2.20* 0.42  CREATININE 2.14*  --  1.82*  --  2.07*  --   --   < > = values in this interval not displayed.  Estimated Creatinine Clearance: 73.1 mL/min (by C-G formula based on Cr of 2.07).    Assessment: [age] YOF with history of Afib on Coumadin PTA.  Coumadin was reversed for possible surgery, which is deferred and IV heparin was started.  Heparin level elevated this afternoon; however, RN reported that lab was drawn from the central line where heparin was infusing (RN followed proper procedure).  Repeat HL is therapeutic at 0.42 on heparin 1400 units/hr. No issues with infusion or bleeding noted.   Goal of Therapy:  Heparin level 0.3-0.7 units/ml Monitor platelets by anticoagulation protocol: Yes    Plan:  Continue heparin 1400 units/hr Daily HL / CBC Monitor s/sx of bleeding  Arlean Hoppingorey M. Newman PiesBall, PharmD, BCPS Clinical Pharmacist Pager [phone]  06/27/2015, 3:52 PM

## 2015-06-27 NOTE — Progress Notes (Signed)
INFECTIOUS DISEASE PROGRESS NOTE  ID: Michele Weber is a 45 y.o. 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HCC)   OSA (obstructive sleep apnea)   Obesity hypoventilation syndrome (HCC)   Chronic kidney disease (CKD), stage IV (severe) (HCC)   Endotracheally intubated   Encounter for imaging study to confirm orogastric (OG) tube placement   AKI (acute kidney injury) (HCC)  Subjective: Without complaints  Abtx:  Anti-infectives    Start     Dose/Rate Route Frequency Ordered Stop   06/26/15 1800  imipenem-cilastatin (PRIMAXIN) 500 mg in sodium chloride 0.9 % 100 mL IVPB     500 mg 200 mL/hr over 30 Minutes Intravenous Every 6 hours 06/26/15 1021     06/26/15 1015  imipenem-cilastatin (PRIMAXIN) 500 mg in sodium chloride 0.9 % 100 mL IVPB     500 mg 200 mL/hr over 30 Minutes Intravenous  Once 06/26/15 1011 06/26/15 1239   06/25/15 0500  colistimethate (COLYMYCIN) 200 mg in sodium chloride 0.9 % 100 mL IVPB  Status:  Discontinued     200 mg 205.3 mL/hr over 30 Minutes Intravenous Every 36 hours 06/23/15 1535 06/24/15 2236   06/25/15 0500  colistimethate (COLYMYCIN) 70 mg in sodium chloride 0.9 % 100 mL IVPB  Status:  Discontinued     70 mg 201.9 mL/hr over 30 Minutes Intravenous Every 12 hours 06/24/15 2236 06/26/15 0958   06/24/15 2000  amikacin (AMIKIN) 1,000 mg in dextrose 5 % 100 mL IVPB  Status:  Discontinued     1,000 mg 104 mL/hr over 60 Minutes Intravenous Every 48 hours 06/22/15 1608 06/23/15 1417   06/23/15 1800  Ampicillin-Sulbactam (UNASYN) 3 g in sodium chloride 0.9 % 100 mL IVPB  Status:  Discontinued     3 g 100 mL/hr over 60 Minutes Intravenous Every 6 hours 06/23/15 1435 06/26/15 0958   06/23/15 1800  linezolid (ZYVOX) tablet 600 mg  Status:  Discontinued     600 mg Per Tube Every 12 hours 06/23/15 1706 06/26/15 0958   06/23/15 1700  colistimethate (COLYMYCIN) 300 mg in sodium chloride 0.9 % 100 mL IVPB     300 mg 208 mL/hr over 30  Minutes Intravenous  Once 06/23/15 1535 06/23/15 1746   06/23/15 1530  linezolid (ZYVOX) 100 MG/5ML suspension 600 mg  Status:  Discontinued     600 mg Oral Every 12 hours 06/23/15 1407 06/23/15 1419   06/23/15 1430  linezolid (ZYVOX) tablet 600 mg  Status:  Discontinued     600 mg Per Tube Every 12 hours 06/23/15 1419 06/23/15 1706   06/22/15 2000  amikacin (AMIKIN) 1,050 mg in dextrose 5 % 100 mL IVPB     1,050 mg 104.2 mL/hr over 60 Minutes Intravenous  Once 06/22/15 1608 06/22/15 2341   06/22/15 1900  vancomycin (VANCOCIN) 2,500 mg in sodium chloride 0.9 % 500 mL IVPB  Status:  Discontinued     2,500 mg 250 mL/hr over 120 Minutes Intravenous Every 48 hours 06/22/15 1837 06/23/15 1407   06/22/15 1800  Ampicillin-Sulbactam (UNASYN) 3 g in sodium chloride 0.9 % 100 mL IVPB  Status:  Discontinued     3 g 100 mL/hr over 60 Minutes Intravenous Every 8 hours 06/22/15 1608 06/23/15 1435   06/22/15 0900  [MAR Hold]  cefTAZidime (FORTAZ) 2 g in dextrose 5 % 50 mL IVPB  Status:  Discontinued     (MAR Hold since 06/22/15 1556)   2 g 100 mL/hr over 30 Minutes  Intravenous Every 12 hours 06/22/15 0826 06/22/15 1609   06/22/15 0300  clindamycin (CLEOCIN) IVPB 900 mg  Status:  Discontinued     900 mg 100 mL/hr over 30 Minutes Intravenous Every 8 hours 06/22/15 0158 06/23/15 1407   06/22/15 0200  piperacillin-tazobactam (ZOSYN) IVPB 3.375 g  Status:  Discontinued     3.375 g 12.5 mL/hr over 240 Minutes Intravenous Every 8 hours 06/22/15 0158 06/22/15 0756      Medications:  Scheduled: . antiseptic oral rinse  7 mL Mouth Rinse BID  . Chlorhexidine Gluconate Cloth  6 each Topical Q0600  . collagenase   Topical Daily  . hydrocortisone sod succinate (SOLU-CORTEF) inj  50 mg Intravenous Daily  . imipenem-cilastatin  500 mg Intravenous Q6H  . insulin aspart  1-3 Units Subcutaneous Q4H  . levothyroxine  50 mcg Oral QAC breakfast  . mupirocin ointment  1 application Nasal BID  . pantoprazole  40 mg  Oral Q1200  . sodium chloride flush  10-40 mL Intracatheter Q12H    Objective: Vital signs in last 24 hours: Temp:  [97.6 F (36.4 C)-98.3 F (36.8 C)] 98 F (36.7 C) (05/10 0735) Pulse Rate:  [110-141] 110 (05/10 0800) Resp:  [8-33] 28 (05/10 0800) BP: (88-120)/(47-87) 104/87 mmHg (05/10 0800) SpO2:  [96 %-100 %] 100 % (05/10 0800)   General appearance: alert and no distress Resp: clear to auscultation bilaterally Cardio: regular rate and rhythm GI: normal findings: bowel sounds normal and soft, non-tender Incision/Wound: dressed. LLE partially undressed, clean.   Lab Results  Recent Labs  06/26/15 0434 06/27/15 0447  WBC 20.6* 20.9*  HGB 7.7* 8.0*  HCT 25.3* 25.8*  NA 146* 142  K 3.3* 3.5  CL 111 105  CO2 23 24  BUN 45* 49*  CREATININE 1.82* 2.07*   Liver Panel  Recent Labs  06/25/15 0545  PROT 4.9*  ALBUMIN 1.8*  AST 9*  ALT 15  ALKPHOS 68  BILITOT 0.4   Sedimentation Rate No results for input(s): ESRSEDRATE in the last 72 hours. C-Reactive Protein No results for input(s): CRP in the last 72 hours.  Microbiology: Recent Results (from the past 240 hour(s))  Culture, blood (routine x 2)     Status: None   Collection Time: 06/20/15  5:11 PM  Result Value Ref Range Status   Specimen Description BLOOD RIGHT ASSIST CONTROL  Final   Special Requests BOTTLES DRAWN AEROBIC AND ANAEROBIC  5CC  Final   Culture NO GROWTH 5 DAYS  Final   Report Status 06/25/2015 FINAL  Final  Culture, blood (routine x 2)     Status: None   Collection Time: 06/20/15  5:11 PM  Result Value Ref Range Status   Specimen Description BLOOD LEFT ASSIST CONTROL  Final   Special Requests BOTTLES DRAWN AEROBIC AND ANAEROBIC  5CC  Final   Culture NO GROWTH 5 DAYS  Final   Report Status 06/25/2015 FINAL  Final  Wound culture     Status: None   Collection Time: 06/20/15  5:11 PM  Result Value Ref Range Status   Specimen Description WOUND  Final   Special Requests Normal  Final    Gram Stain   Final    FEW WBC SEEN MANY GRAM NEGATIVE RODS FEW GRAM POSITIVE COCCI    Culture   Final    HEAVY GROWTH ACINETOBACTER SPECIES HEAVY GROWTH PROVIDENCIA STUARTII FOR ACINETOBACTER REFER TO SUSCEPTIBILITIES PERFORMED AT [facility]    Report Status 06/27/2015 FINAL  Final  Organism ID, Bacteria PROVIDENCIA STUARTII  Final      Susceptibility   Providencia stuartii - MIC*    AMPICILLIN 16 RESISTANT Resistant     CEFAZOLIN 16 RESISTANT Resistant     CEFEPIME <=1 SENSITIVE Sensitive     CEFTAZIDIME <=1 SENSITIVE Sensitive     CEFTRIAXONE <=1 SENSITIVE Sensitive     CIPROFLOXACIN >=4 RESISTANT Resistant     GENTAMICIN 2 RESISTANT Resistant     IMIPENEM 2 SENSITIVE Sensitive     TRIMETH/SULFA >=320 RESISTANT Resistant     AMPICILLIN/SULBACTAM 4 SENSITIVE Sensitive     PIP/TAZO <=4 SENSITIVE Sensitive     * HEAVY GROWTH PROVIDENCIA STUARTII  Surgical PCR screen     Status: None   Collection Time: 06/20/15 10:10 PM  Result Value Ref Range Status   MRSA, PCR NEGATIVE NEGATIVE Final   Staphylococcus aureus NEGATIVE NEGATIVE Final    Comment:        The Xpert SA Assay (FDA approved for NASAL specimens in patients over 69 years of age), is one component of a comprehensive surveillance program.  Test performance has been validated by [facility] for patients greater than or equal to 46 year old. It is not intended to diagnose infection nor to guide or monitor treatment.   Culture, blood (Routine X 2) w Reflex to ID Panel     Status: None (Preliminary result)   Collection Time: 06/22/15  2:35 AM  Result Value Ref Range Status   Specimen Description BLOOD RIGHT ANTECUBITAL  Final   Special Requests BOTTLES DRAWN AEROBIC ONLY 10CC  Final   Culture NO GROWTH 4 DAYS  Final   Report Status PENDING  Incomplete  Wound culture     Status: None   Collection Time: 06/22/15  3:04 AM  Result Value Ref Range Status   Specimen Description WOUND  Final   Special Requests LEFT  LEG  Final   Gram Stain   Final    FEW WBC PRESENT, PREDOMINANTLY PMN NO SQUAMOUS EPITHELIAL CELLS SEEN MODERATE GRAM NEGATIVE COCCOBACILLI Performed at [facility]    Culture   Final    ABUNDANT ACINETOBACTER CALCOACETICUS/BAUMANNII COMPLEX Performed at [facility]    Report Status 06/25/2015 FINAL  Final   Organism ID, Bacteria ACINETOBACTER CALCOACETICUS/BAUMANNII COMPLEX  Final      Susceptibility   Acinetobacter calcoaceticus/baumannii complex - MIC*    CEFTAZIDIME >=64 RESISTANT Resistant     CIPROFLOXACIN >=4 RESISTANT Resistant     GENTAMICIN >=16 RESISTANT Resistant     IMIPENEM 2 SENSITIVE Sensitive     PIP/TAZO 16 SENSITIVE Sensitive     TOBRAMYCIN >=16 RESISTANT Resistant     * ABUNDANT ACINETOBACTER CALCOACETICUS/BAUMANNII COMPLEX  Culture, blood (Routine X 2) w Reflex to ID Panel     Status: None (Preliminary result)   Collection Time: 06/22/15  8:00 AM  Result Value Ref Range Status   Specimen Description BLOOD RIGHT ARM  Final   Special Requests BOTTLES DRAWN AEROBIC ONLY 7CC  Final   Culture NO GROWTH 4 DAYS  Final   Report Status PENDING  Incomplete  Wound culture     Status: None   Collection Time: 06/22/15  2:56 PM  Result Value Ref Range Status   Specimen Description WOUND LEFT LEG  Final   Special Requests NONE  Final   Gram Stain   Final    ABUNDANT WBC PRESENT, PREDOMINANTLY PMN NO SQUAMOUS EPITHELIAL CELLS SEEN NO ORGANISMS SEEN Performed at [facility]  Culture   Final    NO GROWTH 2 DAYS [facility]    Report Status 06/25/2015 FINAL  Final  Anaerobic culture     Status: None (Preliminary result)   Collection Time: 06/22/15  6:12 PM  Result Value Ref Range Status   Specimen Description WOUND LEFT LEG  Final   Special Requests PATIENT ON FOLLOWING UNASYN FORTAZ CLEOSIN ZOSYN  Final   Gram Stain   Final    RARE WBC PRESENT, PREDOMINANTLY PMN RARE SQUAMOUS EPITHELIAL CELLS PRESENT ABUNDANT  GRAM NEGATIVE RODS [facility]    Culture   Final    NO ANAEROBES ISOLATED; CULTURE IN PROGRESS FOR 5 DAYS [facility]    Report Status PENDING  Incomplete  Wound culture     Status: None   Collection Time: 06/22/15  6:12 PM  Result Value Ref Range Status   Specimen Description WOUND LEFT LEG  Final   Special Requests PATIENT ON FOLLOWING UNASYN FORTAZ CLEOCIN ZOSYN  Final   Gram Stain   Final    RARE WBC PRESENT, PREDOMINANTLY PMN RARE SQUAMOUS EPITHELIAL CELLS PRESENT ABUNDANT GRAM NEGATIVE RODS [facility]    Culture   Final    MULTIPLE ORGANISMS PRESENT, NONE PREDOMINANT Note: NO STAPHYLOCOCCUS AUREUS ISOLATED NO GROUP A STREP (S.PYOGENES) ISOLATED [facility]formed at [facility]    Report Status 06/25/2015 FINAL  Final    Studies/Results: No results found.   Assessment/Plan: LLE Necrotizing Fascitis Hx of Acinetobacter bacteremia (05-2015) Morbid Obesity CRI Severe protein calorie malnutrition  Wound Cx 5-3 (P stuartii) and 5-5 (Acinetobacter- S- imipenem)   Total days of antibiotics: 5 (day 1 imipenem)  WBC is stable Cr up slightly Hydrotherapy today.  Per pt to re-assess progress next week and plan further surgery as needed.          Michele SaxJeffrey Delita Weber Infectious Diseases (pager) [phone] [email or web address] 06/27/2015, 8:44 AM  LOS: 5 days

## 2015-06-27 NOTE — Progress Notes (Signed)
ANTICOAGULATION CONSULT NOTE - Follow up Consult  Pharmacy Consult:  Heparin Indication: atrial fibrillation   No Known Allergies  Patient Measurements: Height: 5' (152.4 cm) Weight: (!) 593 lb (268.983 kg) IBW/kg (Calculated) : 45.5 Heparin Dosing Weight: 122 kg  Vital Signs: Temp: 97.7 F (36.5 C) (05/10 1205) Temp Source: Oral (05/10 1205) BP: 111/94 mmHg (05/10 1100) Pulse Rate: 71 (05/10 1100)  Labs:  Recent Labs  06/25/15 0545  06/26/15 0434  06/26/15 2023 06/27/15 0447 06/27/15 1320  HGB 8.5*  --  7.7*  --   --  8.0*  --   HCT 27.2*  --  25.3*  --   --  25.8*  --   PLT 261  --  208  --   --  190  --   HEPARINUNFRC  --   < > 0.86*  < > 0.83* 0.54 >2.20*  CREATININE 2.14*  --  1.82*  --   --  2.07*  --   < > = values in this interval not displayed.  Estimated Creatinine Clearance: 73.1 mL/min (by C-G formula based on Cr of 2.07).    Assessment: [age] YOF with history of Afib on Coumadin PTA.  Coumadin was reversed for possible surgery, which is deferred and IV heparin was started.  Heparin level elevated this afternoon; however, RN reported that lab was drawn from the central line where heparin was infusing (RN followed proper procedure).   Goal of Therapy:  Heparin level 0.3-0.7 units/ml Monitor platelets by anticoagulation protocol: Yes    Plan:  - STAT heparin level - Continue heparin gtt at 1400 units/hr - Daily HL / CBC   Gabryella Murfin D. Laney Potashang, PharmD, BCPS Pager:  [phone] 06/27/2015, 2:38 PM

## 2015-06-28 LAB — GLUCOSE, CAPILLARY
GLUCOSE-CAPILLARY: 101 mg/dL — AB (ref 65–99)
GLUCOSE-CAPILLARY: 108 mg/dL — AB (ref 65–99)
GLUCOSE-CAPILLARY: 108 mg/dL — AB (ref 65–99)
GLUCOSE-CAPILLARY: 93 mg/dL (ref 65–99)
Glucose-Capillary: 82 mg/dL (ref 65–99)
Glucose-Capillary: 110 mg/dL — ABNORMAL HIGH (ref 65–99)

## 2015-06-28 LAB — CBC
HEMATOCRIT: 24 % — AB (ref 36.0–46.0)
HEMOGLOBIN: 7.5 g/dL — AB (ref 12.0–15.0)
MCH: 30.7 pg (ref 26.0–34.0)
MCHC: 31.3 g/dL (ref 30.0–36.0)
MCV: 98.4 fL (ref 78.0–100.0)
Platelets: 173 10*3/uL (ref 150–400)
RBC: 2.44 MIL/uL — AB (ref 3.87–5.11)
RDW: 18.6 % — ABNORMAL HIGH (ref 11.5–15.5)
WBC: 20.5 10*3/uL — ABNORMAL HIGH (ref 4.0–10.5)

## 2015-06-28 LAB — BASIC METABOLIC PANEL
ANION GAP: 11 (ref 5–15)
BUN: 45 mg/dL — ABNORMAL HIGH (ref 6–20)
CHLORIDE: 103 mmol/L (ref 101–111)
CO2: 24 mmol/L (ref 22–32)
Calcium: 7.9 mg/dL — ABNORMAL LOW (ref 8.9–10.3)
Creatinine, Ser: 2.04 mg/dL — ABNORMAL HIGH (ref 0.44–1.00)
GFR calc non Af Amer: 28 mL/min — ABNORMAL LOW (ref >60)
GFR, EST AFRICAN AMERICAN: 33 mL/min — AB (ref >60)
Glucose, Bld: 131 mg/dL — ABNORMAL HIGH (ref 65–99)
POTASSIUM: 3.6 mmol/L (ref 3.5–5.1)
SODIUM: 138 mmol/L (ref 135–145)

## 2015-06-28 LAB — HEPARIN LEVEL (UNFRACTIONATED)
HEPARIN UNFRACTIONATED: 1.42 [IU]/mL — AB (ref 0.30–0.70)
Heparin Unfractionated: 1.78 IU/mL — ABNORMAL HIGH (ref 0.30–0.70)
Heparin Unfractionated: 0.23 IU/mL — ABNORMAL LOW (ref 0.30–0.70)

## 2015-06-28 MED ORDER — HEPARIN BOLUS VIA INFUSION
2000.0000 [IU] | Freq: Once | INTRAVENOUS | Status: AC
  Administered 2015-06-28: 2000 [IU] via INTRAVENOUS
  Filled 2015-06-28: qty 2000

## 2015-06-28 MED ORDER — HEPARIN (PORCINE) IN NACL 100-0.45 UNIT/ML-% IJ SOLN
1600.0000 [IU]/h | INTRAMUSCULAR | Status: DC
  Administered 2015-06-28: 1400 [IU]/h via INTRAVENOUS
  Filled 2015-06-28: qty 250

## 2015-06-28 MED ORDER — FLUCONAZOLE IN SODIUM CHLORIDE 100-0.9 MG/50ML-% IV SOLN
100.0000 mg | INTRAVENOUS | Status: DC
  Administered 2015-06-28 – 2015-06-29 (×2): 100 mg via INTRAVENOUS
  Filled 2015-06-28 (×3): qty 50

## 2015-06-28 MED ORDER — WHITE PETROLATUM GEL
Status: AC
  Administered 2015-06-28: 1
  Filled 2015-06-28: qty 1

## 2015-06-28 NOTE — Progress Notes (Signed)
ANTICOAGULATION CONSULT NOTE - Follow up Consult  Pharmacy Consult:  Heparin Indication: atrial fibrillation   No Known Allergies  Patient Measurements: Height: 5' (152.4 cm) Weight: (!) 580 lb (263.086 kg) IBW/kg (Calculated) : 45.5 Heparin Dosing Weight: 122 kg  Vital Signs: Temp: 97.7 F (36.5 C) (05/11 0740) Temp Source: Oral (05/11 0740) BP: 89/66 mmHg (05/11 0749) Pulse Rate: 136 (05/11 0749)  Labs:  Recent Labs  06/26/15 0434  06/27/15 0447  06/27/15 1450 06/28/15 0301 06/28/15 0458  HGB 7.7*  --  8.0*  --   --  7.5*  --   HCT 25.3*  --  25.8*  --   --  24.0*  --   PLT 208  --  190  --   --  173  --   HEPARINUNFRC 0.86*  < > 0.54  < > 0.42 1.78* 0.23*  CREATININE 1.82*  --  2.07*  --   --  2.04*  --   < > = values in this interval not displayed.  Estimated Creatinine Clearance: 72.8 mL/min (by C-G formula based on Cr of 2.04).   Assessment: 44 YOF on Coumadin PTA for hx Afib. INR 2.46, received FFP and Vit K > INR down to 1.43. No surgery, just hydrotherapy-tolerating, started heparin 5/8.  Last HL was low at 0.23 after redrawing with most likely fasely elevated level from drawing lab from same line that heparin infusing. Hgb down to 7.5, plts wnl.  Goal of Therapy:  Heparin level 0.3-0.7 units/ml Monitor platelets by anticoagulation protocol: Yes   Plan:  Give 2,000 unit heparin BOLUS Increase heparin gtt to 1,600 units/hr Check 6 hr HL Monitor daily HL, CBC, s/s of bleed  Enzo BiNathan Josee Speece, PharmD, [facility] Clinical Pharmacist Pager [phone] 06/28/2015 10:25 AM

## 2015-06-28 NOTE — Progress Notes (Signed)
Pharmacy Antibiotic Note  Michele MowersSharon Weber is a 45 y.o. female admitted on 06/22/2015 with LLE wound infection.  Pharmacy has been consulted for Primaxin and fluconazole dosing.   Day #9 of abx, day #3 of Primaxin for LLE cellulitis/wound infection. S/p I&D on 5/5, hydrotherapy.  Afebrile, WBC elevated at 20.5 (steroids) Found to have some possible fungal intertrigo so adding fluconazole. Will need to watch interaction between fluconazole and amio. SCr up 2.04, normailzed CrCl ~340ml/min.  Plan: Continue Primaxin 500mg  IV Q6H Start fluconazole 100mg  IV Q24 Monitor clinical picture, renal function F/U LOT   Height: 5' (152.4 cm) Weight: (!) 580 lb (263.086 kg) IBW/kg (Calculated) : 45.5  Temp (24hrs), Avg:98.1 F (36.7 C), Min:97.7 F (36.5 C), Max:98.6 F (37 C)   Recent Labs Lab 06/21/15 1815 06/22/15 0448  06/22/15 1521  06/24/15 0547 06/25/15 0545 06/26/15 0434 06/27/15 0447 06/28/15 0301  WBC  --   --   < >  --   < > 17.0* 20.6* 20.6* 20.9* 20.5*  CREATININE  --   --   < >  --   < > 2.62* 2.14* 1.82* 2.07* 2.04*  LATICACIDVEN  --  1.4  --   --   --   --   --   --   --   --   VANCORANDOM 20  --   --  13  --   --   --   --   --   --   < > = values in this interval not displayed.  Estimated Creatinine Clearance: 72.8 mL/min (by C-G formula based on Cr of 2.04).    No Known Allergies  Antimicrobials this admission: Zosyn 5/3 >> 5/5 Vanc 5/3 >> 5/6 Clinda 5/3 >> 5/6 Amikacin 5/5 >> 5/6 Unasyn 5/5 >> 5/9 Zyvox 5/6 >> 5/9 Colistin IV 5/6 >> 5/9 Primaxin 5/9 >> Fluconazole 5/11 >>  Dose adjustments this admission: n/a  Microbiology results: Cultures from last admit:  4/5 WCx + BCx (old cx) >> MDR Acinetabacter (pan-R except I-Tobra/Zosyn/CTX, S-Unasyn/Ceftaz/Imi) 4/9 BCx (old cx) >> MDR Acinetobacter (pan-R except I-Ceftaz/Unasyn) * Also noted to be R-Avycaz per discussion with ID (Cassie)  Cultures from [facility]: 5/3 Blood x 2 - ngtd 5/3 MRSA PCR negative 5/3  WCx >> Providencia (sensitive Unasyn, CTX, Zosyn, Primaxin)  This admit:  5/5 WCx (intra-op) - Acinetobacter (sensitive Primaxin, Zosyn) 5/5 BCx - NGTD  Thank you for allowing pharmacy to be a part of this patient's care.  Armandina StammerBATCHELDER,Merced Hanners J 06/28/2015 10:34 AM

## 2015-06-28 NOTE — Progress Notes (Signed)
ANTICOAGULATION CONSULT NOTE - Follow up Consult  Pharmacy Consult:  Heparin Indication: atrial fibrillation   No Known Allergies  Patient Measurements: Height: 5' (152.4 cm) Weight: (!) 580 lb (263.086 kg) IBW/kg (Calculated) : 45.5 Heparin Dosing Weight: 122 kg  Vital Signs: Temp: 98.1 F (36.7 C) (05/11 1722) Temp Source: Oral (05/11 1722) BP: 85/72 mmHg (05/11 1722) Pulse Rate: 101 (05/11 1722)  Labs:  Recent Labs  06/26/15 0434  06/27/15 0447  06/28/15 0301 06/28/15 0458 06/28/15 1719  HGB 7.7*  --  8.0*  --  7.5*  --   --   HCT 25.3*  --  25.8*  --  24.0*  --   --   PLT 208  --  190  --  173  --   --   HEPARINUNFRC 0.86*  < > 0.54  < > 1.78* 0.23* 1.42*  CREATININE 1.82*  --  2.07*  --  2.04*  --   --   < > = values in this interval not displayed.  Estimated Creatinine Clearance: 72.8 mL/min (by C-G formula based on Cr of 2.04).   Assessment: 44 YOF on Coumadin PTA for hx Afib. INR 2.46, received FFP and Vit K > INR down to 1.43. No surgery, just hydrotherapy-tolerating, started heparin 5/8.  Last HL was low at 0.23 after redrawing with most likely fasely elevated level from drawing lab from same line that heparin infusing. Hgb down to 7.5, plts wnl.  Repeat HL is supratherapeutic at 1.42 on heparin 1600 units/hr. Nurse reports no issues with infusion, lab draw or bleeding. Will hold and reduce rate.  Goal of Therapy:  Heparin level 0.3-0.7 units/ml Monitor platelets by anticoagulation protocol: Yes   Plan:  Hold heparin for 1 hour and decrease to 1400 units/hr Check 6 hr HL Monitor daily HL, CBC, s/s of bleed  Arlean Hoppingorey M. Newman PiesBall, PharmD, BCPS Clinical Pharmacist Pager [phone]  06/28/2015 6:22 PM

## 2015-06-28 NOTE — Progress Notes (Signed)
PROGRESS NOTE    Marcie MowersSharon Kagel  VWU:[id number] DOB: [birth year] DOA: 06/22/2015 [facility]  [facility] Specialists:     Brief Narrative:  45 y/o ? Body mass index is 113.27 kg/(m^2).  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 Elta Guadeloupeooper-found to have Gram neg bacteremia had MDR acinetobacter calcoaceticus/baumannii 05/2015 admit  [facility] 2/2 to septic shock-required Levophed initially Gen surgery consulted -underwent on 06/22/15 Sharp incision/ debridement/ irrigation of skin, subcutaneous fat, muscle - left lower extremity Total area excised 850 cm2 ID involved in care  [facility] assumed care 06/28/15 Hydrotherapy started     Assessment & Plan:   Active Problems:   Necrotizing soft tissue infection (HCC)   OSA (obstructive sleep apnea)   Obesity hypoventilation syndrome (HCC)   Chronic kidney disease (CKD), stage IV (severe) (HCC)   Endotracheally intubated   Encounter for imaging study to confirm orogastric (OG) tube placement   AKI (acute kidney injury) (HCC)  Severe sepsis secondary to multiple left thigh abscesses in the setting MDR organisms -Required levophed until 06/25/2015 -Wound cultures from 5/4 confirm Acinetobacter, 5/3 (P stuartii) -Blood cultures x 2 5/4, 5/5 no growth to date and no growth on anaerobic cultures -For now continue broad-spectrum Primaxin -Appreciate ID monitoring -Consider MASD/fungal coverage given patient also has what appears to be growing fungal intertrigo that developed since placement of Foley catheter-have asked pharmacy to dose  Acute respiratory failure with traumatic intubation and/or 06/22/2015 OSA/OHSS -Continue CPAP at night  Permanent atrial fibrillation, [place] score >2 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 -Life-threatening -Her weight has dropped from 274 kg on day of admission-->263 06/28/15 -Monitor  Loose stool -This is normal for the patient -Monitor for C. difficile if there is concern  Sacral decubitus -Stage I and secondary to loose stool -Wound nurse to address  Chronic kidney disease Hypokalemia Hypocalcemia-corrects with albumin calculation Hyponatremia Acidosis secondary to sepsis, -Unclear how to calculate GFR given prohibitive habitus -NA and seems to have diminished regardless -Anion gap today 5/11 is within normal range  Anemia of chronic disease/anemia of acute blood loss Patient on IV heparin for A. fib -Patient will need transfusion for hemoglobin below 7 -RDW 18, MCV 98 raising possibility of Foley acid/B12 deficiency -Obtain folic acid, B12 level -Wounds have scant bleeding throughout   DVT prophylaxis: Heparin-therapeutic Code Status: Full Family Communication: no family is + at this time Disposition Plan: sdu for now-needs 1:1 care at present   Consultants:   Gen Surg  Procedures:   multiple  Antimicrobials:    currently primaxin   Started Diflucan   Subjective:  Sore throat No difficulty swallowing. Chronic diarrhea Pain in lower extremities is controlled No other shift  Objective: Filed Vitals:   06/27/15 2347 06/28/15 0008 06/28/15 0400 06/28/15 0423  BP:  91/62  93/57  Pulse: 130 134  135  Temp:  98.1 F (36.7 C) 98.4 F (36.9 C)   TempSrc:  Oral Axillary   Resp: 15 20  24   Height:      Weight:      SpO2:  100% 100%  100%    Intake/Output Summary (Last 24 hours) at 06/28/15 0729 Last data filed at 06/28/15 0200  Gross per 24 hour   Intake  937.3 ml  Output    650 ml  Net  287.3 ml   Filed Weights   06/23/15 0500 06/25/15 0500 06/27/15 2100  Weight: 265.354 kg (585 lb) 268.983 kg (593 lb) 263.086 kg (580 lb)    Examination:  General exam:  calm and comfortable  Respiratory system: Clear to auscultation Cardiovascular system: S1 & S2 . Gastrointestinal system: Abdomen is nondistended, soft and nontender. No organomegaly or masses felt. Normal bowel sounds heard. Central nervous system: Alert and oriented. No focal neurological deficits. Extremities: Symmetric 5 x 5 power. Skin: This is the depth of the wound that is noted below  Left forefoot with tendon exposed  Inner calf  Inner thigh and left upper     Psychiatry: Judgement and insight appear normal. Mood & affect appropriate.     Data Reviewed: I have personally reviewed following labs and imaging studies  CBC:  Recent Labs Lab 06/24/15 0547 06/25/15 0545 06/26/15 0434 06/27/15 0447 06/28/15 0301  WBC 17.0* 20.6* 20.6* 20.9* 20.5*  NEUTROABS 15.0* 17.6*  --   --   --   HGB 9.1* 8.5* 7.7* 8.0* 7.5*  HCT 29.6* 27.2* 25.3* 25.8* 24.0*  MCV 96.4 97.8 97.7 100.0 98.4  PLT 265 261 208 190 173   Basic Metabolic Panel:  Recent Labs Lab 06/22/15 0507  06/23/15 0514 06/23/15 2314 06/24/15 0547 06/25/15 0545 06/26/15 0434 06/27/15 0447 06/28/15 0301  NA 138  < > 140 139 141 142 146* 142 138  K 4.1  < > 4.4 3.6 3.6 3.4* 3.3* 3.5 3.6  CL 103  --  108 108 107 105 111 105 103  CO2 19*  --  17* 20* 20* GLUCOSE 143*  --  134* 290* 169* 140* 112* 94 131*  BUN 85*  --  71* 59* 59* 51* 45* 49* 45*  CREATININE 3.64*  --  2.91* 2.64* 2.62* 2.14* 1.82* 2.07* 2.04*  CALCIUM 8.0*  --  7.6* 7.3* 7.6* 7.7* 7.3* 8.1* 7.9*  MG 1.6*  --  1.6* 1.4*  --   --   --   --   --   PHOS 5.9*  --  4.8*  --   --   --   --   --   --   < > = values in this interval not displayed. GFR: Estimated Creatinine Clearance: 72.8 mL/min (by C-G formula  based on Cr of 2.04). Liver Function Tests:  Recent Labs Lab 06/24/15 0547 06/25/15 0545  AST 13* 9*  ALT 14 15  ALKPHOS 58 68  BILITOT 0.4 0.4  PROT 4.6* 4.9*  ALBUMIN 1.7* 1.8*   No results for input(s): LIPASE, AMYLASE in the last 168 hours. No results for input(s): AMMONIA in the last 168 hours. Coagulation Profile:  Recent Labs Lab 06/22/15 0915 06/22/15 1521 06/22/15 2100  INR 3.34* 1.80* 1.43   Cardiac Enzymes: No results for input(s): CKTOTAL, CKMB, CKMBINDEX, TROPONINI in the last 168 hours. BNP (last 3 results) No results for input(s): PROBNP in the last 8760 hours. HbA1C: No results for input(s): HGBA1C in the last 72 hours. CBG:  Recent Labs Lab 06/27/15 1204 06/27/15 1509 06/27/15 2049 06/28/15 0007 06/28/15 0422  GLUCAP 95 123* 126* 93 101*   Lipid Profile: No results for input(s): CHOL, HDL, LDLCALC, TRIG,  CHOLHDL, LDLDIRECT in the last 72 hours. Thyroid Function Tests: No results for input(s): TSH, T4TOTAL, FREET4, T3FREE, THYROIDAB in the last 72 hours. Anemia Panel: No results for input(s): VITAMINB12, FOLATE, FERRITIN, TIBC, IRON, RETICCTPCT in the last 72 hours. Urine analysis:    Component Value Date/Time   COLORURINE AMBER* 05/23/2015 1804   APPEARANCEUR TURBID* 05/23/2015 1804   LABSPEC 1.020 05/23/2015 1804   PHURINE 5.0 05/23/2015 1804   GLUCOSEU NEGATIVE 05/23/2015 1804   HGBUR LARGE* 05/23/2015 1804   BILIRUBINUR SMALL* 05/23/2015 1804   KETONESUR 15* 05/23/2015 1804   PROTEINUR 100* 05/23/2015 1804   UROBILINOGEN 0.2 09/30/2013 1550   NITRITE NEGATIVE 05/23/2015 1804   LEUKOCYTESUR TRACE* 05/23/2015 1804   Recent Results (from the past 240 hour(s))  Culture, blood (routine x 2)     Status: None   Collection Time: 06/20/15  5:11 PM  Result Value Ref Range Status   Specimen Description BLOOD RIGHT ASSIST CONTROL  Final   Special Requests BOTTLES DRAWN AEROBIC AND ANAEROBIC  5CC  Final   Culture NO GROWTH 5 DAYS  Final    Report Status 06/25/2015 FINAL  Final  Culture, blood (routine x 2)     Status: None   Collection Time: 06/20/15  5:11 PM  Result Value Ref Range Status   Specimen Description BLOOD LEFT ASSIST CONTROL  Final   Special Requests BOTTLES DRAWN AEROBIC AND ANAEROBIC  5CC  Final   Culture NO GROWTH 5 DAYS  Final   Report Status 06/25/2015 FINAL  Final  Wound culture     Status: None   Collection Time: 06/20/15  5:11 PM  Result Value Ref Range Status   Specimen Description WOUND  Final   Special Requests Normal  Final   Gram Stain   Final    FEW WBC SEEN MANY GRAM NEGATIVE RODS FEW GRAM POSITIVE COCCI    Culture   Final    HEAVY GROWTH ACINETOBACTER SPECIES HEAVY GROWTH PROVIDENCIA STUARTII FOR ACINETOBACTER REFER TO SUSCEPTIBILITIES PERFORMED AT [facility]    Report Status 06/27/2015 FINAL  Final   Organism ID, Bacteria PROVIDENCIA STUARTII  Final      Susceptibility   Providencia stuartii - MIC*    AMPICILLIN 16 RESISTANT Resistant     CEFAZOLIN 16 RESISTANT Resistant     CEFEPIME <=1 SENSITIVE Sensitive     CEFTAZIDIME <=1 SENSITIVE Sensitive     CEFTRIAXONE <=1 SENSITIVE Sensitive     CIPROFLOXACIN >=4 RESISTANT Resistant     GENTAMICIN 2 RESISTANT Resistant     IMIPENEM 2 SENSITIVE Sensitive     TRIMETH/SULFA >=320 RESISTANT Resistant     AMPICILLIN/SULBACTAM 4 SENSITIVE Sensitive     PIP/TAZO <=4 SENSITIVE Sensitive     * HEAVY GROWTH PROVIDENCIA STUARTII  Surgical PCR screen     Status: None   Collection Time: 06/20/15 10:10 PM  Result Value Ref Range Status   MRSA, PCR NEGATIVE NEGATIVE Final   Staphylococcus aureus NEGATIVE NEGATIVE Final    Comment:        The Xpert SA Assay (FDA approved for NASAL specimens in patients over 55 years of age), is one component of a comprehensive surveillance program.  Test performance has been validated by [facility] for patients greater than or equal to 27 year old. It is not intended to diagnose infection nor to guide or  monitor treatment.   Culture, blood (Routine X 2) w Reflex to ID Panel     Status: None   Collection  Time: 06/22/15  2:35 AM  Result Value Ref Range Status   Specimen Description BLOOD RIGHT ANTECUBITAL  Final   Special Requests BOTTLES DRAWN AEROBIC ONLY 10CC  Final   Culture NO GROWTH 5 DAYS  Final   Report Status 06/27/2015 FINAL  Final  Wound culture     Status: None   Collection Time: 06/22/15  3:04 AM  Result Value Ref Range Status   Specimen Description WOUND  Final   Special Requests LEFT LEG  Final   Gram Stain   Final    FEW WBC PRESENT, PREDOMINANTLY PMN NO SQUAMOUS EPITHELIAL CELLS SEEN MODERATE GRAM NEGATIVE COCCOBACILLI Performed at [facility]    Culture   Final    ABUNDANT ACINETOBACTER CALCOACETICUS/BAUMANNII COMPLEX Performed at [facility]    Report Status 06/25/2015 FINAL  Final   Organism ID, Bacteria ACINETOBACTER CALCOACETICUS/BAUMANNII COMPLEX  Final      Susceptibility   Acinetobacter calcoaceticus/baumannii complex - MIC*    CEFTAZIDIME >=64 RESISTANT Resistant     CIPROFLOXACIN >=4 RESISTANT Resistant     GENTAMICIN >=16 RESISTANT Resistant     IMIPENEM 2 SENSITIVE Sensitive     PIP/TAZO 16 SENSITIVE Sensitive     TOBRAMYCIN >=16 RESISTANT Resistant     * ABUNDANT ACINETOBACTER CALCOACETICUS/BAUMANNII COMPLEX  Culture, blood (Routine X 2) w Reflex to ID Panel     Status: None   Collection Time: 06/22/15  8:00 AM  Result Value Ref Range Status   Specimen Description BLOOD RIGHT ARM  Final   Special Requests BOTTLES DRAWN AEROBIC ONLY 7CC  Final   Culture NO GROWTH 5 DAYS  Final   Report Status 06/27/2015 FINAL  Final  Wound culture     Status: None   Collection Time: 06/22/15  2:56 PM  Result Value Ref Range Status   Specimen Description WOUND LEFT LEG  Final   Special Requests NONE  Final   Gram Stain   Final    ABUNDANT WBC PRESENT, PREDOMINANTLY PMN NO SQUAMOUS EPITHELIAL CELLS SEEN NO ORGANISMS SEEN Performed at  [facility]    Culture   Final    NO GROWTH 2 DAYS Performed at [facility]    Report Status 06/25/2015 FINAL  Final  Anaerobic culture     Status: None   Collection Time: 06/22/15  6:12 PM  Result Value Ref Range Status   Specimen Description WOUND LEFT LEG  Final   Special Requests PATIENT ON FOLLOWING UNASYN FORTAZ CLEOSIN ZOSYN  Final   Gram Stain   Final    RARE WBC PRESENT, PREDOMINANTLY PMN RARE SQUAMOUS EPITHELIAL CELLS PRESENT ABUNDANT GRAM NEGATIVE RODS Performed at [facility]    Culture   Final    NO ANAEROBES ISOLATED Performed at [facility]    Report Status 06/27/2015 FINAL  Final  Wound culture     Status: None   Collection Time: 06/22/15  6:12 PM  Result Value Ref Range Status   Specimen Description WOUND LEFT LEG  Final   Special Requests PATIENT ON FOLLOWING UNASYN FORTAZ CLEOCIN ZOSYN  Final   Gram Stain   Final    RARE WBC PRESENT, PREDOMINANTLY PMN RARE SQUAMOUS EPITHELIAL CELLS PRESENT ABUNDANT GRAM NEGATIVE RODS Performed at [facility]    Culture   Final    MULTIPLE ORGANISMS PRESENT, NONE PREDOMINANT Note: NO STAPHYLOCOCCUS AUREUS ISOLATED NO GROUP A STREP (S.PYOGENES) ISOLATED Performed at [facility]    Report Status 06/25/2015 FINAL  Final         Radiology Studies: No results found.      Scheduled Meds: . antiseptic oral rinse  7 mL Mouth Rinse BID  . Chlorhexidine Gluconate Cloth  6 each Topical Q0600  . collagenase   Topical Daily  . feeding supplement (ENSURE ENLIVE)  237 mL Oral BID BM  . imipenem-cilastatin  500 mg Intravenous Q6H  . insulin aspart  1-3 Units Subcutaneous Q4H  . levothyroxine  50 mcg Oral QAC breakfast  . multivitamin with minerals  1 tablet Oral Daily  . mupirocin ointment  1 application Nasal BID  . pantoprazole  40 mg Oral Q1200  . sodium chloride flush  10-40 mL Intracatheter Q12H  . topiramate  50 mg Oral BID   Continuous Infusions: .  sodium chloride 10 mL/hr at 06/24/15 0750  . amiodarone 30 mg/hr (06/28/15 0008)  . heparin 1,400 Units/hr (06/28/15 0248)     LOS: 6 days    Time spent: 19    Pleas Koch, MD [facility]   If 7PM-7AM, please contact night-coverage [email or web address] Password [facility] 06/28/2015, 7:29 AM

## 2015-06-28 NOTE — Consult Note (Signed)
WOC wound consult note Reason for Consult: [facility] Patient with open wounds from debridements on the left leg that start at the mid thigh down to the dorsal foot.  Dr. Mahala MenghiniSamtani has taken photos today that should be added to the chart. See PT hydro notes for measurements and wound descriptions. WOC in with PT and Hospitalist at the bedside for assessment of the wounds today. The thigh wounds are very large and very deep.  I do not feel at this time I could successfully get NPWT to seal on these sites.  Mainly due to the location and the ability to have enough manpower to raise the leg high enough to be successful and maintenance  of a seal will be challenging.  I will attempt placement of NPWT dressings to the dorsal foot wound and the lateral calf wound at this time, they are both clean.   Wound type: surgical Pressure Ulcer POA: Yes; sacrum see WOC notes from 06/21/15 Measurement: see PT hydro notes for left leg wound measurements and wound bed description.  Dressing procedure/placement/frequency: 1. Attempted NPWT VAC dressing to the right dorsal foot, worked for 45 minutes plus with different attempts to seal this areas. Unfortunately the proximity to the toes was problematic even with the use of hydrocolloid strips. Additionally the patient has some skin changes associated with lymphadema that make adherence to her skin a big issue, the drape will not stick easily to and skin creases even with skin prep applied prior to dressing application. I was not able to seal the dressing for multiple reasons, decided to return to BID moist saline gauze dressings for now.  Will reattempt NPWT VAC dressing Monday with change of other sites at that time.  2.  NPWT dressing applied to the right lateral calf wound, 1pc of black foam use to fill the space, drape and sealed at 125mmHG.  3.  Patient has small ulceration on the right lateral side of her pannus that is draining tremendous amounts of  lymphatic fluid.  The area surrounding the ulceration is hardened from the skin changes and tissue changes associated with her obesity and pendulous abdomen along with lymphedema. Protected the periwound skin with drape, 1 small pc. Of black foam used to cover ulceration (it has no depth) and drape use to seal site.  Seal at 125mmHG with y-connector to one VAC machine.  WOC will follow this patient with complex wounds and change NPWT VAC dressings, will not change dressings again until Monday.  Tarren Velardi [place] RN,CWOCN [phone]

## 2015-06-28 NOTE — Progress Notes (Signed)
INFECTIOUS DISEASE PROGRESS NOTE  ID: Michele Weber is a 45 y.o. 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HCC)   OSA (obstructive sleep apnea)   Obesity hypoventilation syndrome (HCC)   Chronic kidney disease (CKD), stage IV (severe) (HCC)   Endotracheally intubated   Encounter for imaging study to confirm orogastric (OG) tube placement   AKI (acute kidney injury) (HCC)  Subjective: Without complaints  Abtx:  Anti-infectives    Start     Dose/Rate Route Frequency Ordered Stop   06/28/15 1200  fluconazole (DIFLUCAN) IVPB 100 mg     100 mg 50 mL/hr over 60 Minutes Intravenous Every 24 hours 06/28/15 1037     06/26/15 1800  imipenem-cilastatin (PRIMAXIN) 500 mg in sodium chloride 0.9 % 100 mL IVPB     500 mg 200 mL/hr over 30 Minutes Intravenous Every 6 hours 06/26/15 1021     06/26/15 1015  imipenem-cilastatin (PRIMAXIN) 500 mg in sodium chloride 0.9 % 100 mL IVPB     500 mg 200 mL/hr over 30 Minutes Intravenous  Once 06/26/15 1011 06/26/15 1239   06/25/15 0500  colistimethate (COLYMYCIN) 200 mg in sodium chloride 0.9 % 100 mL IVPB  Status:  Discontinued     200 mg 205.3 mL/hr over 30 Minutes Intravenous Every 36 hours 06/23/15 1535 06/24/15 2236   06/25/15 0500  colistimethate (COLYMYCIN) 70 mg in sodium chloride 0.9 % 100 mL IVPB  Status:  Discontinued     70 mg 201.9 mL/hr over 30 Minutes Intravenous Every 12 hours 06/24/15 2236 06/26/15 0958   06/24/15 2000  amikacin (AMIKIN) 1,000 mg in dextrose 5 % 100 mL IVPB  Status:  Discontinued     1,000 mg 104 mL/hr over 60 Minutes Intravenous Every 48 hours 06/22/15 1608 06/23/15 1417   06/23/15 1800  Ampicillin-Sulbactam (UNASYN) 3 g in sodium chloride 0.9 % 100 mL IVPB  Status:  Discontinued     3 g 100 mL/hr over 60 Minutes Intravenous Every 6 hours 06/23/15 1435 06/26/15 0958   06/23/15 1800  linezolid (ZYVOX) tablet 600 mg  Status:  Discontinued     600 mg Per Tube Every 12 hours 06/23/15 1706  06/26/15 0958   06/23/15 1700  colistimethate (COLYMYCIN) 300 mg in sodium chloride 0.9 % 100 mL IVPB     300 mg 208 mL/hr over 30 Minutes Intravenous  Once 06/23/15 1535 06/23/15 1746   06/23/15 1530  linezolid (ZYVOX) 100 MG/5ML suspension 600 mg  Status:  Discontinued     600 mg Oral Every 12 hours 06/23/15 1407 06/23/15 1419   06/23/15 1430  linezolid (ZYVOX) tablet 600 mg  Status:  Discontinued     600 mg Per Tube Every 12 hours 06/23/15 1419 06/23/15 1706   06/22/15 2000  amikacin (AMIKIN) 1,050 mg in dextrose 5 % 100 mL IVPB     1,050 mg 104.2 mL/hr over 60 Minutes Intravenous  Once 06/22/15 1608 06/22/15 2341   06/22/15 1900  vancomycin (VANCOCIN) 2,500 mg in sodium chloride 0.9 % 500 mL IVPB  Status:  Discontinued     2,500 mg 250 mL/hr over 120 Minutes Intravenous Every 48 hours 06/22/15 1837 06/23/15 1407   06/22/15 1800  Ampicillin-Sulbactam (UNASYN) 3 g in sodium chloride 0.9 % 100 mL IVPB  Status:  Discontinued     3 g 100 mL/hr over 60 Minutes Intravenous Every 8 hours 06/22/15 1608 06/23/15 1435   06/22/15 0900  [MAR Hold]  cefTAZidime (FORTAZ) 2 g  in dextrose 5 % 50 mL IVPB  Status:  Discontinued     (MAR Hold since 06/22/15 1556)   2 g 100 mL/hr over 30 Minutes Intravenous Every 12 hours 06/22/15 0826 06/22/15 1609   06/22/15 0300  clindamycin (CLEOCIN) IVPB 900 mg  Status:  Discontinued     900 mg 100 mL/hr over 30 Minutes Intravenous Every 8 hours 06/22/15 0158 06/23/15 1407   06/22/15 0200  piperacillin-tazobactam (ZOSYN) IVPB 3.375 g  Status:  Discontinued     3.375 g 12.5 mL/hr over 240 Minutes Intravenous Every 8 hours 06/22/15 0158 06/22/15 0756      Medications:  Scheduled: . antiseptic oral rinse  7 mL Mouth Rinse BID  . Chlorhexidine Gluconate Cloth  6 each Topical Q0600  . collagenase   Topical Daily  . feeding supplement (ENSURE ENLIVE)  237 mL Oral BID BM  . fluconazole (DIFLUCAN) IV  100 mg Intravenous Q24H  . heparin  2,000 Units Intravenous Once    . imipenem-cilastatin  500 mg Intravenous Q6H  . insulin aspart  1-3 Units Subcutaneous Q4H  . levothyroxine  50 mcg Oral QAC breakfast  . multivitamin with minerals  1 tablet Oral Daily  . mupirocin ointment  1 application Nasal BID  . pantoprazole  40 mg Oral Q1200  . sodium chloride flush  10-40 mL Intracatheter Q12H  . topiramate  50 mg Oral BID    Objective: Vital signs in last 24 hours: Temp:  [97.7 F (36.5 C)-98.6 F (37 C)] 97.7 F (36.5 C) (05/11 0740) Pulse Rate:  [71-140] 136 (05/11 0749) Resp:  [13-24] 23 (05/11 0749) BP: (75-111)/(40-94) 89/66 mmHg (05/11 0749) SpO2:  [93 %-100 %] 100 % (05/11 0749) Weight:  [263.086 kg (580 lb)] 263.086 kg (580 lb) (05/10 2100)   General appearance: alert, cooperative, no distress and morbidly obese Incision/Wound: LLE wounds dressed. Vac's placed laterally.   Lab Results  Recent Labs  06/27/15 0447 06/28/15 0301  WBC 20.9* 20.5*  HGB 8.0* 7.5*  HCT 25.8* 24.0*  NA 142 138  K 3.5 3.6  CL 105 103  CO2 24 24  BUN 49* 45*  CREATININE 2.07* 2.04*   Liver Panel No results for input(s): PROT, ALBUMIN, AST, ALT, ALKPHOS, BILITOT, BILIDIR, IBILI in the last 72 hours. Sedimentation Rate No results for input(s): ESRSEDRATE in the last 72 hours. C-Reactive Protein No results for input(s): CRP in the last 72 hours.  Microbiology: Recent Results (from the past 240 hour(s))  Culture, blood (routine x 2)     Status: None   Collection Time: 06/20/15  5:11 PM  Result Value Ref Range Status   Specimen Description BLOOD RIGHT ASSIST CONTROL  Final   Special Requests BOTTLES DRAWN AEROBIC AND ANAEROBIC  5CC  Final   Culture NO GROWTH 5 DAYS  Final   Report Status 06/25/2015 FINAL  Final  Culture, blood (routine x 2)     Status: None   Collection Time: 06/20/15  5:11 PM  Result Value Ref Range Status   Specimen Description BLOOD LEFT ASSIST CONTROL  Final   Special Requests BOTTLES DRAWN AEROBIC AND ANAEROBIC  5CC  Final    Culture NO GROWTH 5 DAYS  Final   Report Status 06/25/2015 FINAL  Final  Wound culture     Status: None   Collection Time: 06/20/15  5:11 PM  Result Value Ref Range Status   Specimen Description WOUND  Final   Special Requests Normal  Final   Gram Stain  Final    FEW WBC SEEN MANY GRAM NEGATIVE RODS FEW GRAM POSITIVE COCCI    Culture   Final    HEAVY GROWTH ACINETOBACTER SPECIES HEAVY GROWTH PROVIDENCIA STUARTII FOR ACINETOBACTER REFER TO SUSCEPTIBILITIES PERFORMED AT [place]    Report Status 06/27/2015 FINAL  Final   Organism ID, Bacteria PROVIDENCIA STUARTII  Final      Susceptibility   Providencia stuartii - MIC*    AMPICILLIN 16 RESISTANT Resistant     CEFAZOLIN 16 RESISTANT Resistant     CEFEPIME <=1 SENSITIVE Sensitive     CEFTAZIDIME <=1 SENSITIVE Sensitive     CEFTRIAXONE <=1 SENSITIVE Sensitive     CIPROFLOXACIN >=4 RESISTANT Resistant     GENTAMICIN 2 RESISTANT Resistant     IMIPENEM 2 SENSITIVE Sensitive     TRIMETH/SULFA >=320 RESISTANT Resistant     AMPICILLIN/SULBACTAM 4 SENSITIVE Sensitive     PIP/TAZO <=4 SENSITIVE Sensitive     * HEAVY GROWTH PROVIDENCIA STUARTII  Surgical PCR screen     Status: None   Collection Time: 06/20/15 10:10 PM  Result Value Ref Range Status   MRSA, PCR NEGATIVE NEGATIVE Final   Staphylococcus aureus NEGATIVE NEGATIVE Final    Comment:        The Xpert SA Assay (FDA approved for NASAL specimens in patients over 81 years of age), is one component of a comprehensive surveillance program.  Test performance has been validated by [facility] for patients greater than or equal to 77 year old. It is not intended to diagnose infection nor to guide or monitor treatment.   Culture, blood (Routine X 2) w Reflex to ID Panel     Status: None   Collection Time: 06/22/15  2:35 AM  Result Value Ref Range Status   Specimen Description BLOOD RIGHT ANTECUBITAL  Final   Special Requests BOTTLES DRAWN AEROBIC ONLY 10CC  Final   Culture  NO GROWTH 5 DAYS  Final   Report Status 06/27/2015 FINAL  Final  Wound culture     Status: None   Collection Time: 06/22/15  3:04 AM  Result Value Ref Range Status   Specimen Description WOUND  Final   Special Requests LEFT LEG  Final   Gram Stain   Final    FEW WBC PRESENT, PREDOMINANTLY PMN NO SQUAMOUS EPITHELIAL CELLS SEEN MODERATE GRAM NEGATIVE COCCOBACILLI Performed at [facility]    Culture   Final    ABUNDANT ACINETOBACTER CALCOACETICUS/BAUMANNII COMPLEX Performed at [facility]    Report Status 06/25/2015 FINAL  Final   Organism ID, Bacteria ACINETOBACTER CALCOACETICUS/BAUMANNII COMPLEX  Final      Susceptibility   Acinetobacter calcoaceticus/baumannii complex - MIC*    CEFTAZIDIME >=64 RESISTANT Resistant     CIPROFLOXACIN >=4 RESISTANT Resistant     GENTAMICIN >=16 RESISTANT Resistant     IMIPENEM 2 SENSITIVE Sensitive     PIP/TAZO 16 SENSITIVE Sensitive     TOBRAMYCIN >=16 RESISTANT Resistant     * ABUNDANT ACINETOBACTER CALCOACETICUS/BAUMANNII COMPLEX  Culture, blood (Routine X 2) w Reflex to ID Panel     Status: None   Collection Time: 06/22/15  8:00 AM  Result Value Ref Range Status   Specimen Description BLOOD RIGHT ARM  Final   Special Requests BOTTLES DRAWN AEROBIC ONLY 7CC  Final   Culture NO GROWTH 5 DAYS  Final   Report Status 06/27/2015 FINAL  Final  Wound culture     Status: None   Collection Time: 06/22/15  2:56  PM  Result Value Ref Range Status   Specimen Description WOUND LEFT LEG  Final   Special Requests NONE  Final   Gram Stain   Final    ABUNDANT WBC PRESENT, PREDOMINANTLY PMN NO SQUAMOUS EPITHELIAL CELLS SEEN NO ORGANISMS SEEN Performed at [facility]    Culture   Final    NO GROWTH 2 DAYS Performed at [facility]    Report Status 06/25/2015 FINAL  Final  Anaerobic culture     Status: None   Collection Time: 06/22/15  6:12 PM  Result Value Ref Range Status   Specimen Description WOUND LEFT LEG   Final   Special Requests PATIENT ON FOLLOWING UNASYN FORTAZ CLEOSIN ZOSYN  Final   Gram Stain   Final    RARE WBC PRESENT, PREDOMINANTLY PMN RARE SQUAMOUS EPITHELIAL CELLS PRESENT ABUNDANT GRAM NEGATIVE RODS Performed at [facility]    Culture   Final    NO ANAEROBES ISOLATED Performed at [facility]    Report Status 06/27/2015 FINAL  Final  Wound culture     Status: None   Collection Time: 06/22/15  6:12 PM  Result Value Ref Range Status   Specimen Description WOUND LEFT LEG  Final   Special Requests PATIENT ON FOLLOWING UNASYN FORTAZ CLEOCIN ZOSYN  Final   Gram Stain   Final    RARE WBC PRESENT, PREDOMINANTLY PMN RARE SQUAMOUS EPITHELIAL CELLS PRESENT ABUNDANT GRAM NEGATIVE RODS Performed at [facility]    Culture   Final    MULTIPLE ORGANISMS PRESENT, NONE PREDOMINANT Note: NO STAPHYLOCOCCUS AUREUS ISOLATED NO GROUP A STREP (S.PYOGENES) ISOLATED Performed at [facility]    Report Status 06/25/2015 FINAL  Final    Studies/Results: No results found.   Assessment/Plan: LLE Necrotizing Fascitis Hx of Acinetobacter bacteremia (05-2015) Morbid Obesity CRI Severe protein calorie malnutrition  Wound Cx 5-3 (P stuartii) and 5-5 (Acinetobacter- S- imipenem)   Total days of antibiotics: 6 (day 2 imipenem)  Would continue good wound care Defer decisions regarding further debridement, amputation to surgery.  continue imipenem for 3 weeks. Stop date 5-30 Available as needed.          Johny Sax Infectious Diseases (pager) [phone] [email or web address] 06/28/2015, 10:54 AM  LOS: 6 days

## 2015-06-28 NOTE — Progress Notes (Signed)
Patient continues to be considered as possible LTACH candidate. Patient remains agreeable to return to [facility] when she is medically stable and cleared for discharge if she does not qualify for LTACH. CSW will continue to follow for disposition.     Lance MussAshley Gardner,MSW, LCSW [facility]/55M Clinical Social Worker [phone]

## 2015-06-28 NOTE — Progress Notes (Signed)
Physical Therapy Wound Treatment Patient Details  Name: Michele Weber MRN: [id number] Date of Birth: [birth year]  Today's Date: 06/28/2015 Time: 3212-2482 Time Calculation (min): 92 min  Subjective  Subjective: Pt pleasant and agreeable to hydrotherapy Patient and Family Stated Goals: heal wounds Date of Onset: 05/20/15 Prior Treatments: Prior I&D per pt  Pain Score: Pain Score: 0-No pain  Wound Assessment  Pressure Ulcer 08/11/13 scab right upper thigh small open area 2x22 gauze dsg applied (Active)     Pressure Ulcer 09/30/13 Stage II -  Partial thickness loss of dermis presenting as a shallow open ulcer with a red, pink wound bed without slough. (Active)     Pressure Ulcer 09/30/13 Stage II -  Partial thickness loss of dermis presenting as a shallow open ulcer with a red, pink wound bed without slough. (Active)     Pressure Ulcer 06/22/15 Unstageable - Full thickness tissue loss in which the base of the ulcer is covered by slough (yellow, tan, gray, green or brown) and/or eschar (tan, brown or black) in the wound bed. (Active)     Wound / Incision (Open or Dehisced) 08/11/13 Leg Left (Active)  Dressing Type Impregnated gauze (petrolatum) 06/27/2015  4:00 AM  Dressing Changed Changed 06/27/2015  6:00 AM  Dressing Status Dry;Clean;Intact 06/26/2015  4:00 AM  Dressing Change Frequency Daily 06/26/2015  4:00 AM  Site / Wound Assessment Bleeding;Painful 06/07/2015  8:30 PM  Drainage Amount Minimal 06/07/2015  8:30 PM  Drainage Description Serosanguineous 06/07/2015  8:30 PM     Wound / Incision (Open or Dehisced) Other (Comment) Leg Right;Left cellulitis  (Active)  Dressing Type Impregnated gauze (petrolatum) 06/27/2015  4:00 AM  Dressing Changed Changed 06/27/2015  6:00 AM  Dressing Status Clean;Dry;Intact 06/07/2015  8:30 PM  Dressing Change Frequency Daily 06/07/2015  8:30 PM  Site / Wound Assessment Painful;Bleeding 06/07/2015  8:30 PM  % Wound base Red or Granulating 50% 05/31/2015  2:39 AM   % Wound base Yellow 50% 05/31/2015  2:39 AM  Peri-wound Assessment Induration 05/31/2015  2:39 AM  Margins Unattached edges (unapproximated) 06/01/2015 11:00 PM  Closure None 06/01/2015 11:00 PM  Drainage Amount Minimal 06/07/2015  8:30 PM  Drainage Description Serosanguineous 06/07/2015  8:30 PM  Treatment Cleansed;Other (Comment) 06/01/2015 12:15 AM     Wound / Incision (Open or Dehisced) Hip Left (Active)  Dressing Type ABD;Gauze (Comment);Moist to dry 06/28/2015  6:00 AM  Dressing Changed Changed 06/28/2015  6:00 AM  Dressing Status Clean;Dry;Intact 06/26/2015  4:00 AM  Dressing Change Frequency Daily 06/28/2015  6:00 AM  Site / Wound Assessment Granulation tissue 06/25/2015  4:00 AM  % Wound base Red or Granulating 100% 06/25/2015  4:00 AM  % Wound base Yellow 90% 05/31/2015  2:39 AM  % Wound base Black 0% 05/30/2015  6:00 PM  Peri-wound Assessment Induration 05/31/2015  2:39 AM  Margins Unattached edges (unapproximated) 06/01/2015 11:00 PM  Closure None 06/01/2015 11:00 PM  Drainage Amount Minimal 06/07/2015  8:30 PM  Drainage Description Serosanguineous 06/07/2015  8:30 PM  Treatment Cleansed;Other (Comment) 06/01/2015 11:00 PM     Wound / Incision (Open or Dehisced) 06/20/15 Leg large open weeping wounds extending from left thigh to left foot (Active)  Dressing Type ABD 06/27/2015  4:00 AM  Dressing Changed Changed 06/27/2015  6:00 AM  Dressing Status Intact 06/26/2015  6:00 AM  Dressing Change Frequency Twice a day 06/26/2015  6:00 AM  Site / Wound Assessment Dressing in place / Unable to assess 06/26/2015  4:00 AM  %  Wound base Red or Granulating 25% 06/22/2015 12:00 PM  % Wound base Yellow 25% 06/22/2015 12:00 PM  % Wound base Black 50% 06/22/2015 12:00 PM  Peri-wound Assessment Intact 06/22/2015 12:00 PM  Margins Unattached edges (unapproximated) 06/22/2015 12:00 PM  Closure None 06/22/2015 12:00 PM  Drainage Amount Moderate 06/25/2015 12:00 PM  Drainage Description Purulent;Serosanguineous 06/25/2015 12:00 PM      Wound / Incision (Open or Dehisced) 06/20/15 Foot Left large weeping wound on top of left foot (Active)  Dressing Type ABD 06/28/2015 12:00 PM  Dressing Changed Changed 06/28/2015 12:00 PM  Dressing Status Clean;Dry;Intact 06/28/2015 12:00 PM  Dressing Change Frequency Twice a day 06/28/2015 12:00 PM  Site / Wound Assessment Dressing in place / Unable to assess 06/28/2015 12:00 PM  % Wound base Red or Granulating 90% 06/28/2015 12:00 PM  % Wound base Yellow 5% 06/28/2015 12:00 PM  % Wound base Black 5% 06/28/2015 12:00 PM  Peri-wound Assessment Intact 06/28/2015 12:00 PM  Wound Length (cm) 9 cm 06/26/2015  2:53 PM  Wound Width (cm) 11 cm 06/26/2015  2:53 PM  Wound Depth (cm) 1 cm 06/26/2015  2:53 PM  Margins Unattached edges (unapproximated) 06/28/2015 12:00 PM  Closure None 06/28/2015 12:00 PM  Drainage Amount Minimal 06/28/2015 12:00 PM  Drainage Description Purulent 06/28/2015 12:00 PM  Treatment Hydrotherapy (Pulse lavage);Packing (Saline gauze) 06/28/2015 12:00 PM     Wound / Incision (Open or Dehisced) 06/20/15 Other (Comment) Thigh Right large wound inner thing (Active)  Dressing Type ABD;Gauze (Comment);Moist to dry 06/28/2015  6:00 AM  Dressing Changed Changed 06/28/2015  6:00 AM  Dressing Status Dry;Intact 06/26/2015  6:00 AM  Dressing Change Frequency Daily 06/28/2015  6:00 AM  Site / Wound Assessment Granulation tissue;Pink;Red 06/25/2015  4:00 AM  % Wound base Red or Granulating 100% 06/25/2015  4:00 AM  % Wound base Yellow 5% 06/22/2015 12:00 PM  % Wound base Black 5% 06/22/2015 12:00 PM  Wound Length (cm) 15 cm 06/22/2015 12:30 AM  Wound Width (cm) 10 cm 06/22/2015 12:30 AM     Wound / Incision (Open or Dehisced) 06/26/15 Other (Comment) Thigh Left Superior medial (Active)  Dressing Type ABD;Gauze (Comment);Moist to dry;Barrier Film (skin prep) 06/28/2015 12:00 PM  Dressing Changed Changed 06/28/2015 12:00 PM  Dressing Status Clean;Dry;Intact 06/28/2015 12:00 PM  Dressing Change Frequency Daily  06/28/2015 12:00 PM  Site / Wound Assessment Pink;Brown;Yellow 06/28/2015 12:00 PM  % Wound base Red or Granulating 40% 06/28/2015 12:00 PM  % Wound base Yellow 10% 06/28/2015 12:00 PM  % Wound base Black 5% 06/28/2015 12:00 PM  % Wound base Other (Comment) 45% 06/28/2015 12:00 PM  Peri-wound Assessment Intact 06/28/2015 12:00 PM  Wound Length (cm) 15 cm 06/26/2015  2:53 PM  Wound Width (cm) 30 cm 06/26/2015  2:53 PM  Wound Depth (cm) 4.5 cm 06/26/2015  2:53 PM  Undermining (cm) 4.1 at 11 o'clock, 2.5 at 12 o'clock 06/26/2015  2:53 PM  Margins Unattached edges (unapproximated) 06/28/2015 12:00 PM  Closure None 06/28/2015 12:00 PM  Drainage Amount Minimal 06/28/2015 12:00 PM  Drainage Description Purulent 06/28/2015 12:00 PM  Treatment Debridement (Selective);Hydrotherapy (Pulse lavage);Packing (Saline gauze) 06/28/2015 12:00 PM     Wound / Incision (Open or Dehisced) 06/26/15 Other (Comment) Thigh Left Inferior medial (Active)  Dressing Type ABD;Gauze (Comment);Moist to dry;Barrier Film (skin prep) 06/28/2015 12:00 PM  Dressing Changed Changed 06/28/2015 12:00 PM  Dressing Status Clean;Dry;Intact 06/28/2015 12:00 PM  Dressing Change Frequency Daily 06/28/2015 12:00 PM  Site / Wound  Assessment Brown;Yellow;Pink 06/28/2015 12:00 PM  % Wound base Red or Granulating 40% 06/28/2015 12:00 PM  % Wound base Yellow 10% 06/28/2015 12:00 PM  % Wound base Black 5% 06/28/2015 12:00 PM  % Wound base Other (Comment) 45% 06/28/2015 12:00 PM  Peri-wound Assessment Intact 06/28/2015 12:00 PM  Wound Length (cm) 14 cm 06/26/2015  2:53 PM  Wound Width (cm) 38.5 cm 06/26/2015  2:53 PM  Wound Depth (cm) 6.5 cm 06/26/2015  2:53 PM  Margins Unattached edges (unapproximated) 06/28/2015 12:00 PM  Closure None 06/28/2015 12:00 PM  Drainage Amount Minimal 06/28/2015 12:00 PM  Drainage Description Purulent 06/28/2015 12:00 PM  Treatment Debridement (Selective);Hydrotherapy (Pulse lavage);Packing (Saline gauze) 06/28/2015 12:00 PM     Wound /  Incision (Open or Dehisced) 06/26/15 Other (Comment) Thigh Left;Medial Calf (Active)  Dressing Type ABD;Gauze (Comment);Moist to dry;Barrier Film (skin prep) 06/28/2015 12:00 PM  Dressing Changed Changed 06/28/2015 12:00 PM  Dressing Status Clean;Dry;Intact 06/28/2015 12:00 PM  Dressing Change Frequency Daily 06/28/2015 12:00 PM  Site / Wound Assessment Brown;Yellow;Pink 06/28/2015 12:00 PM  % Wound base Red or Granulating 20% 06/28/2015 12:00 PM  % Wound base Yellow 15% 06/28/2015 12:00 PM  % Wound base Black 5% 06/28/2015 12:00 PM  % Wound base Other (Comment) 60% 06/28/2015 12:00 PM  Peri-wound Assessment Intact 06/28/2015 12:00 PM  Wound Length (cm) 8.5 cm 06/26/2015  2:53 PM  Wound Width (cm) 12.5 cm 06/26/2015  2:53 PM  Wound Depth (cm) 5 cm 06/26/2015  2:53 PM  Margins Unattached edges (unapproximated) 06/28/2015 12:00 PM  Closure None 06/28/2015 12:00 PM  Drainage Amount Minimal 06/28/2015 12:00 PM  Drainage Description Purulent 06/28/2015 12:00 PM  Treatment Debridement (Selective);Hydrotherapy (Pulse lavage);Packing (Saline gauze) 06/28/2015 12:00 PM     Wound / Incision (Open or Dehisced) 06/26/15 Other (Comment) Thigh Left;Lateral (Active)  Dressing Type ABD;Gauze (Comment);Barrier Film (skin prep);Moist to dry 06/28/2015 12:00 PM  Dressing Changed Changed 06/28/2015 12:00 PM  Dressing Status Clean;Dry;Intact 06/28/2015 12:00 PM  Dressing Change Frequency Daily 06/28/2015 12:00 PM  Site / Wound Assessment Brown;Pink;Yellow 06/28/2015 12:00 PM  % Wound base Red or Granulating 60% 06/28/2015 12:00 PM  % Wound base Yellow 15% 06/28/2015 12:00 PM  % Wound base Black 5% 06/28/2015 12:00 PM  % Wound base Other (Comment) 20% 06/28/2015 12:00 PM  Wound Length (cm) 6.5 cm 06/26/2015  2:53 PM  Wound Width (cm) 8 cm 06/26/2015  2:53 PM  Wound Depth (cm) 7 cm 06/26/2015  2:53 PM  Margins Unattached edges (unapproximated) 06/28/2015 12:00 PM  Closure None 06/28/2015 12:00 PM  Drainage Amount Minimal 06/28/2015 12:00 PM   Drainage Description Purulent 06/28/2015 12:00 PM  Treatment Hydrotherapy (Pulse lavage);Packing (Saline gauze) 06/28/2015 12:00 PM  Santyl applied to wound bed prior to applying dressing.  Hydrotherapy Pulsed lavage therapy - wound location: LLE wounds Pulsed Lavage with Suction (psi): 4 psi (to 8 psi) Pulsed Lavage with Suction - Normal Saline Used: 1000 mL (x4) Pulsed Lavage Tip: Tip with splash shield Selective Debridement Selective Debridement - Location: LLE wounds Selective Debridement - Tools Used: Scissors;Forceps Selective Debridement - Tissue Removed: brown and yellow necrotic tissue   Wound Assessment and Plan  Wound Therapy - Assess/Plan/Recommendations Wound Therapy - Clinical Statement: Pt's wounds are cleaning up very micely.  pt to have wound vac placed on dorsum of foot and lateral LE wound.  Will perform hydrotherapy tomorrow and then allow nsg to continue to do dressing changes over the weekend and check back Monday to determine if  there is a need for continued hydrotherapy.   Wound Therapy - Functional Problem List: Decreased strength and AROM Factors Delaying/Impairing Wound Healing: Immobility;Multiple medical problems Hydrotherapy Plan: Debridement;Dressing change;Patient/family education;Pulsatile lavage with suction Wound Therapy - Frequency: 6X / week Wound Therapy - Follow Up Recommendations: Skilled nursing facility Wound Plan: See above  Wound Therapy Goals- Improve the function of patient's integumentary system by progressing the wound(s) through the phases of wound healing (inflammation - proliferation - remodeling) by: Decrease Necrotic Tissue to: 25% Decrease Necrotic Tissue - Progress: Met Increase Granulation Tissue to: 75% Increase Granulation Tissue - Progress: Progressing toward goal Goals/treatment plan/discharge plan were made with and agreed upon by patient/family: Yes Wound Therapy - Potential for Goals: Good  Goals will be updated until  maximal potential achieved or discharge criteria met.  Discharge criteria: when goals achieved, discharge from hospital, MD decision/surgical intervention, no progress towards goals, refusal/missing three consecutive treatments without notification or medical reason.  GP     Carmesha Morocco, Thornton Papas 06/28/2015, 12:39 PM

## 2015-06-28 NOTE — Progress Notes (Signed)
6 Days Post-Op  Subjective: NAE hydrotx tol well  Objective: Vital signs in last 24 hours: Temp:  [97.7 F (36.5 C)-98.6 F (37 C)] 98.4 F (36.9 C) (05/11 0400) Pulse Rate:  [71-156] 135 (05/11 0423) Resp:  [13-28] 24 (05/11 0423) BP: (78-111)/(40-94) 93/57 mmHg (05/11 0423) SpO2:  [93 %-100 %] 100 % (05/11 0423) Weight:  [263.086 kg (580 lb)] 263.086 kg (580 lb) (05/10 2100) Last BM Date: 06/28/15  Intake/Output from previous day: 05/10 0701 - 05/11 0700 In: 937.3 [I.V.:637.3; IV Piggyback:300] Out: 650 [Urine:650] Intake/Output this shift:    General appearance: alert and cooperative Incision/Wound: LE wounds clean  Lab Results:   Recent Labs  06/27/15 0447 06/28/15 0301  WBC 20.9* 20.5*  HGB 8.0* 7.5*  HCT 25.8* 24.0*  PLT 190 173   BMET  Recent Labs  06/27/15 0447 06/28/15 0301  NA 142 138  K 3.5 3.6  CL 105 103  CO2 24 24  GLUCOSE 94 131*  BUN 49* 45*  CREATININE 2.07* 2.04*  CALCIUM 8.1* 7.9*   PT/INR No results for input(s): LABPROT, INR in the last 72 hours. ABG No results for input(s): PHART, HCO3 in the last 72 hours.  Invalid input(s): PCO2, PO2  Studies/Results: No results found.  Anti-infectives: Anti-infectives    Start     Dose/Rate Route Frequency Ordered Stop   06/26/15 1800  imipenem-cilastatin (PRIMAXIN) 500 mg in sodium chloride 0.9 % 100 mL IVPB     500 mg 200 mL/hr over 30 Minutes Intravenous Every 6 hours 06/26/15 1021     06/26/15 1015  imipenem-cilastatin (PRIMAXIN) 500 mg in sodium chloride 0.9 % 100 mL IVPB     500 mg 200 mL/hr over 30 Minutes Intravenous  Once 06/26/15 1011 06/26/15 1239   06/25/15 0500  colistimethate (COLYMYCIN) 200 mg in sodium chloride 0.9 % 100 mL IVPB  Status:  Discontinued     200 mg 205.3 mL/hr over 30 Minutes Intravenous Every 36 hours 06/23/15 1535 06/24/15 2236   06/25/15 0500  colistimethate (COLYMYCIN) 70 mg in sodium chloride 0.9 % 100 mL IVPB  Status:  Discontinued     70  mg 201.9 mL/hr over 30 Minutes Intravenous Every 12 hours 06/24/15 2236 06/26/15 0958   06/24/15 2000  amikacin (AMIKIN) 1,000 mg in dextrose 5 % 100 mL IVPB  Status:  Discontinued     1,000 mg 104 mL/hr over 60 Minutes Intravenous Every 48 hours 06/22/15 1608 06/23/15 1417   06/23/15 1800  Ampicillin-Sulbactam (UNASYN) 3 g in sodium chloride 0.9 % 100 mL IVPB  Status:  Discontinued     3 g 100 mL/hr over 60 Minutes Intravenous Every 6 hours 06/23/15 1435 06/26/15 0958   06/23/15 1800  linezolid (ZYVOX) tablet 600 mg  Status:  Discontinued     600 mg Per Tube Every 12 hours 06/23/15 1706 06/26/15 0958   06/23/15 1700  colistimethate (COLYMYCIN) 300 mg in sodium chloride 0.9 % 100 mL IVPB     300 mg 208 mL/hr over 30 Minutes Intravenous  Once 06/23/15 1535 06/23/15 1746   06/23/15 1530  linezolid (ZYVOX) 100 MG/5ML suspension 600 mg  Status:  Discontinued     600 mg Oral Every 12 hours 06/23/15 1407 06/23/15 1419   06/23/15 1430  linezolid (ZYVOX) tablet 600 mg  Status:  Discontinued     600 mg Per Tube Every 12 hours 06/23/15 1419 06/23/15 1706   06/22/15 2000  amikacin (AMIKIN) 1,050 mg in dextrose 5 % 100  mL IVPB     1,050 mg 104.2 mL/hr over 60 Minutes Intravenous  Once 06/22/15 1608 06/22/15 2341   06/22/15 1900  vancomycin (VANCOCIN) 2,500 mg in sodium chloride 0.9 % 500 mL IVPB  Status:  Discontinued     2,500 mg 250 mL/hr over 120 Minutes Intravenous Every 48 hours 06/22/15 1837 06/23/15 1407   06/22/15 1800  Ampicillin-Sulbactam (UNASYN) 3 g in sodium chloride 0.9 % 100 mL IVPB  Status:  Discontinued     3 g 100 mL/hr over 60 Minutes Intravenous Every 8 hours 06/22/15 1608 06/23/15 1435   06/22/15 0900  [MAR Hold]  cefTAZidime (FORTAZ) 2 g in dextrose 5 % 50 mL IVPB  Status:  Discontinued     (MAR Hold since 06/22/15 1556)   2 g 100 mL/hr over 30 Minutes Intravenous Every 12 hours 06/22/15 0826 06/22/15 1609   06/22/15 0300  clindamycin (CLEOCIN) IVPB 900 mg  Status:   Discontinued     900 mg 100 mL/hr over 30 Minutes Intravenous Every 8 hours 06/22/15 0158 06/23/15 1407   06/22/15 0200  piperacillin-tazobactam (ZOSYN) IVPB 3.375 g  Status:  Discontinued     3.375 g 12.5 mL/hr over 240 Minutes Intravenous Every 8 hours 06/22/15 0158 06/22/15 0756      Assessment/Plan: s/p Procedure(s): DEBRIDEMENT LEFT LEG INFECTION (Left) Will ask WOC RN to eval for wound Vac placement    LOS: 6 days    Marigene Ehlers., [facility] 06/28/2015

## 2015-06-29 DIAGNOSIS — L8990 Pressure ulcer of unspecified site, unspecified stage: Secondary | ICD-10-CM | POA: Insufficient documentation

## 2015-06-29 LAB — CBC
HEMATOCRIT: 24.8 % — AB (ref 36.0–46.0)
HEMOGLOBIN: 7.6 g/dL — AB (ref 12.0–15.0)
MCH: 29.2 pg (ref 26.0–34.0)
MCHC: 30.6 g/dL (ref 30.0–36.0)
MCV: 95.4 fL (ref 78.0–100.0)
Platelets: 143 10*3/uL — ABNORMAL LOW (ref 150–400)
RBC: 2.6 MIL/uL — ABNORMAL LOW (ref 3.87–5.11)
RDW: 18.2 % — ABNORMAL HIGH (ref 11.5–15.5)
WBC: 16.9 10*3/uL — AB (ref 4.0–10.5)

## 2015-06-29 LAB — GLUCOSE, CAPILLARY
GLUCOSE-CAPILLARY: 81 mg/dL (ref 65–99)
GLUCOSE-CAPILLARY: 107 mg/dL — AB (ref 65–99)
Glucose-Capillary: 92 mg/dL (ref 65–99)
Glucose-Capillary: 102 mg/dL — ABNORMAL HIGH (ref 65–99)
Glucose-Capillary: 122 mg/dL — ABNORMAL HIGH (ref 65–99)
Glucose-Capillary: 90 mg/dL (ref 65–99)

## 2015-06-29 LAB — HEPARIN LEVEL (UNFRACTIONATED): Heparin Unfractionated: 0.18 IU/mL — ABNORMAL LOW (ref 0.30–0.70)

## 2015-06-29 LAB — PREPARE RBC (CROSSMATCH)

## 2015-06-29 MED ORDER — RIVAROXABAN 20 MG PO TABS: 20.0000 mg | ORAL_TABLET | Freq: Every day | ORAL | Status: AC

## 2015-06-29 MED ORDER — TORSEMIDE 20 MG PO TABS: 20.0000 mg | ORAL_TABLET | Freq: Every day | ORAL | Status: AC

## 2015-06-29 MED ORDER — AMIODARONE HCL 200 MG PO TABS
200.0000 mg | ORAL_TABLET | Freq: Two times a day (BID) | ORAL | Status: DC
  Administered 2015-06-29 (×2): 200 mg via ORAL
  Filled 2015-06-29 (×2): qty 1

## 2015-06-29 MED ORDER — SODIUM CHLORIDE 0.9 % IV SOLN: 500.0000 mg | Freq: Four times a day (QID) | INTRAVENOUS | Status: AC

## 2015-06-29 MED ORDER — FUROSEMIDE 10 MG/ML IJ SOLN
20.0000 mg | Freq: Once | INTRAMUSCULAR | Status: AC
  Administered 2015-06-29: 20 mg via INTRAVENOUS
  Filled 2015-06-29: qty 2

## 2015-06-29 MED ORDER — DIPHENHYDRAMINE HCL 25 MG PO CAPS
25.0000 mg | ORAL_CAPSULE | Freq: Once | ORAL | Status: AC
  Administered 2015-06-29: 25 mg via ORAL
  Filled 2015-06-29: qty 1

## 2015-06-29 MED ORDER — LEVOTHYROXINE SODIUM 50 MCG PO TABS: 50.0000 ug | ORAL_TABLET | Freq: Every day | ORAL | Status: AC

## 2015-06-29 MED ORDER — RIVAROXABAN 20 MG PO TABS
20.0000 mg | ORAL_TABLET | Freq: Every day | ORAL | Status: DC
  Administered 2015-06-29: 20 mg via ORAL
  Filled 2015-06-29: qty 1

## 2015-06-29 MED ORDER — CHLORHEXIDINE GLUCONATE CLOTH 2 % EX PADS: 6.0000 | MEDICATED_PAD | Freq: Every day | CUTANEOUS | Status: AC

## 2015-06-29 MED ORDER — FLUCONAZOLE IN SODIUM CHLORIDE 100-0.9 MG/50ML-% IV SOLN: 100.0000 mg | INTRAVENOUS | Status: AC

## 2015-06-29 MED ORDER — SODIUM CHLORIDE 0.9 % IV SOLN
Freq: Once | INTRAVENOUS | Status: AC
  Administered 2015-06-29: 15:00:00 via INTRAVENOUS

## 2015-06-29 MED ORDER — COLLAGENASE 250 UNIT/GM EX OINT: TOPICAL_OINTMENT | Freq: Every day | CUTANEOUS | Status: AC

## 2015-06-29 NOTE — Care Management Important Message (Signed)
Important Message  Patient Details  Name: Michele Weber MRN: [id number] Date of Birth: 06/04/1970   Medicare Important Message Given:  Yes    Elain Wixon Abena 06/29/2015, 10:59 AM

## 2015-06-29 NOTE — Progress Notes (Signed)
ANTICOAGULATION CONSULT NOTE - Follow Up Consult  Pharmacy Consult for Heparin  Indication: atrial fibrillation  No Known Allergies  Patient Measurements: Height: 5' (152.4 cm) Weight: (!) 580 lb (263.086 kg) IBW/kg (Calculated) : 45.5  Vital Signs: Temp: 97.8 F (36.6 C) (05/12 0342) Temp Source: Oral (05/12 0342) BP: 96/20 mmHg (05/12 0342) Pulse Rate: 101 (05/11 1722)  Labs:  Recent Labs  06/27/15 0447  06/28/15 0301 06/28/15 0458 06/28/15 1719 06/29/15 0400  HGB 8.0*  --  7.5*  --   --  7.6*  HCT 25.8*  --  24.0*  --   --  24.8*  PLT 190  --  173  --   --  143*  HEPARINUNFRC 0.54  < > 1.78* 0.23* 1.42* 0.18*  CREATININE 2.07*  --  2.04*  --   --   --   < > = values in this interval not displayed.  Estimated Creatinine Clearance: 72.8 mL/min (by C-G formula based on Cr of 2.04).  Assessment: Heparin level is sub-therapeutic this AM, no issues per RN.   Goal of Therapy:  Heparin level 0.3-0.7 units/ml Monitor platelets by anticoagulation protocol: Yes   Plan:  -Increase heparin to 1600 units/hr -1300 HL  Michele Weber, Michele Weber 06/29/2015,4:44 AM

## 2015-06-29 NOTE — Care Management Note (Signed)
Case Management Note  Patient Details  Name: Marcie MowersSharon Ferrucci MRN: [id number] Date of Birth: 02-Sep-1970  Subjective/Objective:   [facility] but [facility].  Pt's bed @ [facility] is still available and transfer has been arranged for today.                                Expected Discharge Plan:  Long Term Acute Care (LTAC)  Discharge planning Services  CM Consult  Post Acute Care Choice:  Long Term Acute Care (LTAC)  Status of Service:  Completed, signed off  Medicare Important Message Given:  Yes  Magdalene RiverMayo, Maija Biggers T, RN 06/29/2015, 11:42 AM

## 2015-06-29 NOTE — Discharge Summary (Addendum)
Physician Discharge Summary  Michele Weber ZOX:[id number] DOB: 1970/09/16 DOA: 06/22/2015  PCP: [facility]  Admit date: 06/22/2015 Discharge date: 06/29/2015  Time spent: 45 minutes  Recommendations for Outpatient Follow-up:  1. Needs cmet/cbc in about 48 hrs 2. Transfused 1 U PRBC prior to d/c 06/29/15 3. Patient will need completion of IV Primaxin on 07/17/2015-has completed 6 days of IV Primaxin in the hospital and wound cultures did grow P stuartii and Acinetobacter 4. Patient will require indwelling Foley catheterization to prevent left lower extremity from becoming infected 5. Patient should continue Diflucan by mouth or IV X4 to 5 days for intertrigo which is likely fungus related-would check LFTs periodically 6. Patient may benefit from wound VAC placed over multiple lower extremity wounds and Gen. surgery should see the patient and evaluate the patient periodically with regards to need for further debridement 7. Continue Hydrotherapy per LTAC protocol 8. On discharge the patient was net +9 L and it was felt reasonable to resume Demadex at a lower dose to help promote resolution of lymphedema and lower extremities-please keep a close watch on kidney function 9. Paradoxically patient has lost weight and has gone from admission weight of 265 kg to 263 kg-patient may have severe malnutrition in the setting of super morbid obesity and will need to be screened by dietitian and nutritionist as an outpatient at the LTAC 10. Please allow patient to use CPAP machine with nightly daily at bedtime settings 11. Patient can be transitioned back safely to Xarelto has no current surgical needs are anticipated-patient was on heparin until the day of discharge 12. His monitor QTC carefully as the patient is on amiodarone as well as fluconazole which are CYP 450 metabolized drugs  Discharge Diagnoses:  Active Problems:   Necrotizing soft tissue infection (HCC)   OSA (obstructive sleep apnea)  Obesity hypoventilation syndrome (HCC)   Chronic kidney disease (CKD), stage IV (severe) (HCC)   Endotracheally intubated   Encounter for imaging study to confirm orogastric (OG) tube placement   AKI (acute kidney injury) (HCC)   Pressure ulcer   Discharge Condition: fair  Diet recommendation: hh low salt  Filed Weights   06/23/15 0500 06/25/15 0500 06/27/15 2100  Weight: 265.354 kg (585 lb) 268.983 kg (593 lb) 263.086 kg (580 lb)    History of present illness:  45 y/o ? Body mass index is 113.27 kg/(m^2).  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 Elta Guadeloupe to have Gram neg bacteremia had MDR acinetobacter calcoaceticus/baumannii 05/2015 admit  Transferred from [facility] to [facility] 06/22/15 @ [facility] 2/2 to septic shock-required Levophed initially Gen surgery consulted -underwent on 06/22/15 Sharp incision/ debridement/ irrigation of skin, subcutaneous fat, muscle - left lower extremity Total area excised 850 cm2 ID involved in care  [facility] assumed care 06/28/15 Hydrotherapy started  Hospital Course:      Active Problems:   Necrotizing soft tissue infection (HCC)   OSA (obstructive sleep apnea)   Obesity hypoventilation syndrome (HCC)   Chronic kidney disease (CKD), stage IV (severe) (HCC)   Endotracheally intubated   Encounter for imaging study to confirm orogastric (OG) tube placement   AKI (acute kidney injury) (HCC)  Severe sepsis secondary to multiple left thigh abscesses in the setting MDR organisms -Required levophed until 06/25/2015 -Wound cultures from 5/4 confirm Acinetobacter, 5/3 (P stuartii) -Blood cultures x 2 5/4, 5/5 no growth to date and  no growth on anaerobic cultures -For now continue broad-spectrum Primaxin until 07/17/2015 -Appreciate ID monitoring -Continue MASD/fungal coverage given patient  also has what appears to be growing fungal intertrigo that developed since placement of Foley catheter-have asked pharmacy to dose -Would continue IV fluconazole 100 daily 5 more days 07/04/2015 and then stop  Acute respiratory failure with traumatic intubation and/or 06/22/2015 OSA/OHSS -Continue CPAP at night  Permanent atrial fibrillation, [place] score >2 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  EF 55-60% hours 08/11/2013 Spuriously hypotension [cuff is on the forearm and no cuff fits the patient properly] - heparin GTT --->Xarelto 20 daily on day of d/c -Continue amiodarone GTT--> conversion to by mouth to home dose of 200 twice a day -Did resume metoprolol 12.5 twice a day on discharge -Patient is still tachycardic -resumed Demadex at lower dose of 20 daily on day of d/c home -Monitor carefully QTC as patient is also on amiodarone  super morbid obesityBody mass index is 113.27 kg/(m^2)., With paradoxical malnutrition -Life-threatening -Her weight has dropped from 274 kg on day of admission-->263 06/28/15 -Monitor  Loose stool -This is normal for the patient -Monitor for C. difficile if there is concern -loperamide prn  Sacral decubitus -Stage I and secondary to loose stool -Wound nurse to address as OP  Chronic kidney disease Hypokalemia Hypocalcemia-corrects with albumin calculation Hyponatremia Acidosis secondary to sepsis, -Unclear how to calculate GFR given prohibitive habitus -NA and seems to have diminished regardless -Anion gap today 5/11 is within normal range   Anemia of chronic disease/anemia of acute blood loss Patient on IV heparin for A. fib -Patient transfused 1 U PRBC 06/29/15 for hemoglobin below 7 -RDW 18, MCV 98 raising possibility of Foley acid/B12 deficiency -Obtain folic acid, B12 level -Wounds have scant bleeding throughout   DVT prophylaxis: Heparin-therapeutic-->Xarelto on day of d/c Code Status: Full Family Communication: no family is + at this time Disposition  Plan[facility] dr[facility] Suzi RootsLjasani   Consultants:   Gen Surg  Procedures:   Multiple  06/22/15 Dr. Harlon Florseui gen surg Procedure: Lambert ModySharp incision/ debridement/ irrigation of skin, subcutaneous fat, muscle - left lower extremity Total area excised 850 cm2  Antimicrobials:   currently primaxin til 5/30  Started Diflucan--> 5/11 should stop ~ 5/15  Discharge Exam: Filed Vitals:   06/29/15 0342 06/29/15 0901  BP: 96/20 95/63  Pulse:  123  Temp: 97.8 F (36.6 C) 98.4 F (36.9 C)  Resp: 12 17   Alert pleasant oriented in nad Some nausea Some loose stool NO cp Eating fair Limited exam given habitus Skin eamined on 06/28/15              Discharge Instructions    Current Discharge Medication List    CONTINUE these medications which have NOT CHANGED   Details  acetaminophen (TYLENOL) 325 MG tablet Take 650 mg by mouth every 4 (four) hours as needed for moderate pain.    albuterol (PROVENTIL HFA;VENTOLIN HFA) 108 (90 Base) MCG/ACT inhaler Inhale 2 puffs into the lungs every 6 (six) hours as needed for wheezing or shortness of breath.    Amino Acids-Protein Hydrolys (FEEDING SUPPLEMENT, PRO-STAT SUGAR FREE 64,) LIQD Take 30 mLs by mouth 3 (three) times daily with meals. Qty: 900 mL, Refills: 0    amiodarone (PACERONE) 200 MG tablet Take 200 mg by mouth 2 (two) times daily.    cholecalciferol (VITAMIN D) 1000 units tablet Take 6,000 Units by mouth daily.     docusate sodium (COLACE) 100 MG capsule  Take 100 mg by mouth daily as needed for mild constipation.    famotidine (PEPCID) 20 MG tablet Take 20 mg by mouth at bedtime.    fluticasone (FLONASE) 50 MCG/ACT nasal spray Place 1 spray into both nostrils daily as needed for allergies or rhinitis.    guaiFENesin (MUCINEX) 600 MG 12 hr tablet Take 600 mg by mouth 2 (two) times daily.    guaifenesin (ROBITUSSIN) 100 MG/5ML syrup Take 200 mg by mouth every 6 (six) hours as needed for  cough.    Hydrocodone-Acetaminophen 5-300 MG TABS Take 1 tablet by mouth every 6 (six) hours as needed (for pain).    ipratropium-albuterol (DUONEB) 0.5-2.5 (3) MG/3ML SOLN Take 3 mLs by nebulization every 6 (six) hours as needed (for shortness of breath).     levothyroxine (SYNTHROID, LEVOTHROID) 50 MCG tablet Take 50 mcg by mouth daily before breakfast.    loperamide (IMODIUM) 2 MG capsule Take 2 mg by mouth every 6 (six) hours as needed for diarrhea or loose stools.    loratadine (CLARITIN) 10 MG tablet Take 10 mg by mouth daily.    metoprolol tartrate (LOPRESSOR) 25 MG tablet Take 12.5 mg by mouth 2 (two) times daily.    Multiple Vitamin (MULTIVITAMIN WITH MINERALS) TABS tablet Take 1 tablet by mouth daily.    ondansetron (ZOFRAN) 4 MG tablet Take 4 mg by mouth every 8 (eight) hours as needed for nausea or vomiting.    pantoprazole (PROTONIX) 40 MG tablet Take 1 tablet (40 mg total) by mouth daily.    potassium chloride SA (K-DUR,KLOR-CON) 20 MEQ tablet Take 20 mEq by mouth daily.    saccharomyces boulardii (FLORASTOR) 250 MG capsule Take 250 mg by mouth 2 (two) times daily.    simethicone (MYLICON) 80 MG chewable tablet Chew 80 mg by mouth every 6 (six) hours as needed (for indigestion).     topiramate (TOPAMAX) 50 MG tablet Take 50 mg by mouth 2 (two) times daily.    torsemide (DEMADEX) 20 MG tablet Take 40 mg by mouth daily.    warfarin (COUMADIN) 5 MG tablet Take 5 mg by mouth daily.       No Known Allergies    The results of significant diagnostics from this hospitalization (including imaging, microbiology, ancillary and laboratory) are listed below for reference.    Significant Diagnostic Studies: Dg Chest 1 View  06/21/2015  CLINICAL DATA:  Followup for PICC placement. EXAM: CHEST 1 VIEW COMPARISON:  None. FINDINGS: Central catheter extends from the right neck to have its tip lie at the caval atrial junction, well positioned. Heart is normal size. Normal  mediastinal and hilar contours. Clear lungs. No pneumothorax. IMPRESSION: PICC tip projects at the caval atrial junction, well positioned. No acute cardiopulmonary disease. Electronically Signed   By: Amie Portland M.D.   On: 06/21/2015 19:31   Dg Chest Port 1 View  06/25/2015  CLINICAL DATA:  Pulmonary edema, shortness of breath, intubated patient EXAM: PORTABLE CHEST 1 VIEW COMPARISON:  Portable chest x-ray of Jun 24, 2015 FINDINGS: The lungs are borderline hypoinflated. The interstitial markings are less prominent today. There is no alveolar infiltrate, pleural effusion, or pneumothorax. The cardiac silhouette remains enlarged. The pulmonary vascularity remains prominent centrally. The endotracheal tube tip lies 3.6 cm above the carina. The esophagogastric tube tip projects below the inferior margin of the image. The right internal jugular venous catheter tip projects over the posterior aspect of the right second rib. IMPRESSION: Cardiomegaly and mild central pulmonary vascular congestion  with decreased pulmonary interstitial edema. The right internal jugular venous catheter has apparently been partially withdrawn and now its tip projects over the inferior aspect of the inferior vena cava at the level of the posterior second rib. The other support tubes are in stable position. Electronically Signed   By: David  [place] M.D.   On: 06/25/2015 07:25   Dg Chest Port 1 View  06/24/2015  CLINICAL DATA:  Hypoxia EXAM: PORTABLE CHEST 1 VIEW COMPARISON:  Jun 22, 2015 FINDINGS: Endotracheal tube tip is 2.4 cm above the carina. Nasogastric tube tip and side port are below the diaphragm. Central catheter tip is in the superior vena cava. No pneumothorax. There is no edema or consolidation. There is cardiomegaly with pulmonary vascularity within normal limits. No adenopathy evident. IMPRESSION: Tube and catheter positions without pneumothorax. Stable cardiomegaly. No edema or consolidation. Electronically Signed   By:  Bretta Bang III M.D.   On: 06/24/2015 07:55   Dg Chest Port 1 View  06/22/2015  CLINICAL DATA:  Hypoxia EXAM: PORTABLE CHEST 1 VIEW COMPARISON:  May 31, 2015 FINDINGS: Endotracheal tube tip is at the carina. Central catheter tip in superior vena cava near the cavoatrial junction. No pneumothorax. There is no edema or consolidation. Heart is enlarged with pulmonary vascularity within normal limits. No adenopathy. No bone lesions. IMPRESSION: Endotracheal tube tip is at the carina. Advise withdrawing approximately 3 cm. No edema or consolidation. No pneumothorax. There is cardiomegaly. Critical Value/emergent results were called by telephone at the time of interpretation on 06/22/2015 at 7:25 pm to Wendelyn Breslow, RN , who verbally acknowledged these results. Electronically Signed   By: Bretta Bang III M.D.   On: 06/22/2015 19:25   Dg Chest Port 1 View  05/31/2015  CLINICAL DATA:  Central line placement EXAM: PORTABLE CHEST 1 VIEW COMPARISON:  05/24/2011 FINDINGS: New central line from a left jugular approach. Tip appears to be in the mid right atrium. Right jugular catheter in the SVC unchanged. No pneumothorax Cardiac enlargement. Improvement in vascular congestion. Improvement in bibasilar atelectasis. IMPRESSION: Left jugular central venous catheter tip in the mid right stroke atrium. No pneumothorax Improvement in vascular congestion. Improvement in bibasilar atelectasis. Electronically Signed   By: Marlan Palau M.D.   On: 05/31/2015 15:37   Dg Abd Portable 1v  06/23/2015  CLINICAL DATA:  45 year old female with a history of orogastric tube placement EXAM: PORTABLE ABDOMEN - 1 VIEW COMPARISON:  06/22/2015, 05/31/2015, 05/28/2015 FINDINGS: Limited plain film demonstrating interval placement of gastric tube terminating in the left upper quadrant. The side port terminates near the GE junction, slightly below the diaphragm level. IMPRESSION: Interval placement of gastric tube terminating in the  stomach left upper quadrant. Side port terminates near the GE junction, likely in the cardia. Signed, Yvone Neu. Loreta Ave, DO [facility] Electronically Signed   By: Gilmer Mor D.O.   On: 06/23/2015 17:57    Microbiology: Recent Results (from the past 240 hour(s))  Culture, blood (routine x 2)     Status: None   Collection Time: 06/20/15  5:11 PM  Result Value Ref Range Status   Specimen Description BLOOD RIGHT ASSIST CONTROL  Final   Special Requests BOTTLES DRAWN AEROBIC AND ANAEROBIC  5CC  Final   Culture NO GROWTH 5 DAYS  Final   Report Status 06/25/2015 FINAL  Final  Culture, blood (routine x 2)     Status: None   Collection Time: 06/20/15  5:11 PM  Result  Value Ref Range Status   Specimen Description BLOOD LEFT ASSIST CONTROL  Final   Special Requests BOTTLES DRAWN AEROBIC AND ANAEROBIC  5CC  Final   Culture NO GROWTH 5 DAYS  Final   Report Status 06/25/2015 FINAL  Final  Wound culture     Status: None   Collection Time: 06/20/15  5:11 PM  Result Value Ref Range Status   Specimen Description WOUND  Final   Special Requests Normal  Final   Gram Stain   Final    FEW WBC SEEN MANY GRAM NEGATIVE RODS FEW GRAM POSITIVE COCCI    Culture   Final    HEAVY GROWTH ACINETOBACTER SPECIES HEAVY GROWTH PROVIDENCIA STUARTII FOR ACINETOBACTER REFER TO SUSCEPTIBILITIES PERFORMED AT [facility]    Report Status 06/27/2015 FINAL  Final   Organism ID, Bacteria PROVIDENCIA STUARTII  Final      Susceptibility   Providencia stuartii - MIC*    AMPICILLIN 16 RESISTANT Resistant     CEFAZOLIN 16 RESISTANT Resistant     CEFEPIME <=1 SENSITIVE Sensitive     CEFTAZIDIME <=1 SENSITIVE Sensitive     CEFTRIAXONE <=1 SENSITIVE Sensitive     CIPROFLOXACIN >=4 RESISTANT Resistant     GENTAMICIN 2 RESISTANT Resistant     IMIPENEM 2 SENSITIVE Sensitive     TRIMETH/SULFA >=320 RESISTANT Resistant     AMPICILLIN/SULBACTAM 4 SENSITIVE Sensitive       PIP/TAZO <=4 SENSITIVE Sensitive     * HEAVY GROWTH PROVIDENCIA STUARTII  Surgical PCR screen     Status: None   Collection Time: 06/20/15 10:10 PM  Result Value Ref Range Status   MRSA, PCR NEGATIVE NEGATIVE Final   Staphylococcus aureus NEGATIVE NEGATIVE Final    Comment:        The Xpert SA Assay (FDA approved for NASAL specimens in patients over 14 years of age), is one component of a comprehensive surveillance program.  Test performance has been validated by [facility] for patients greater than or equal to 55 year old. It is not intended to diagnose infection nor to guide or monitor treatment.   Culture, blood (Routine X 2) w Reflex to ID Panel     Status: None   Collection Time: 06/22/15  2:35 AM  Result Value Ref Range Status   Specimen Description BLOOD RIGHT ANTECUBITAL  Final   Special Requests BOTTLES DRAWN AEROBIC ONLY 10CC  Final   Culture NO GROWTH 5 DAYS  Final   Report Status 06/27/2015 FINAL  Final  Wound culture     Status: None   Collection Time: 06/22/15  3:04 AM  Result Value Ref Range Status   Specimen Description WOUND  Final   Special Requests LEFT LEG  Final   Gram Stain   Final    FEW WBC PRESENT, PREDOMINANTLY PMN NO SQUAMOUS EPITHELIAL CELLS SEEN MODERATE GRAM NEGATIVE COCCOBACILLI Performed at [facility]    Culture   Final    ABUNDANT ACINETOBACTER CALCOACETICUS/BAUMANNII COMPLEX Performed at [facility]    Report Status 06/25/2015 FINAL  Final   Organism ID, Bacteria ACINETOBACTER CALCOACETICUS/BAUMANNII COMPLEX  Final      Susceptibility   Acinetobacter calcoaceticus/baumannii complex - MIC*    CEFTAZIDIME >=64 RESISTANT Resistant     CIPROFLOXACIN >=4 RESISTANT Resistant     GENTAMICIN >=16 RESISTANT Resistant     IMIPENEM 2 SENSITIVE Sensitive     PIP/TAZO 16 SENSITIVE Sensitive     TOBRAMYCIN >=16 RESISTANT Resistant     *  ABUNDANT ACINETOBACTER CALCOACETICUS/BAUMANNII COMPLEX  Culture, blood (Routine X 2)  w Reflex to ID Panel     Status: None   Collection Time: 06/22/15  8:00 AM  Result Value Ref Range Status   Specimen Description BLOOD RIGHT ARM  Final   Special Requests BOTTLES DRAWN AEROBIC ONLY 7CC  Final   Culture NO GROWTH 5 DAYS  Final   Report Status 06/27/2015 FINAL  Final  Wound culture     Status: None   Collection Time: 06/22/15  2:56 PM  Result Value Ref Range Status   Specimen Description WOUND LEFT LEG  Final   Special Requests NONE  Final   Gram Stain   Final    ABUNDANT WBC PRESENT, PREDOMINANTLY PMN NO SQUAMOUS EPITHELIAL CELLS SEEN NO ORGANISMS SEEN Performed at [facility]    Culture   Final    NO GROWTH 2 DAYS Performed at [facility]    Report Status 06/25/2015 FINAL  Final  Anaerobic culture     Status: None   Collection Time: 06/22/15  6:12 PM  Result Value Ref Range Status   Specimen Description WOUND LEFT LEG  Final   Special Requests PATIENT ON FOLLOWING UNASYN FORTAZ CLEOSIN ZOSYN  Final   Gram Stain   Final    RARE WBC PRESENT, PREDOMINANTLY PMN RARE SQUAMOUS EPITHELIAL CELLS PRESENT ABUNDANT GRAM NEGATIVE RODS Performed at [facility]    Culture   Final    NO ANAEROBES ISOLATED Performed at [facility]    Report Status 06/27/2015 FINAL  Final  Wound culture     Status: None   Collection Time: 06/22/15  6:12 PM  Result Value Ref Range Status   Specimen Description WOUND LEFT LEG  Final   Special Requests PATIENT ON FOLLOWING UNASYN FORTAZ CLEOCIN ZOSYN  Final   Gram Stain   Final    RARE WBC PRESENT, PREDOMINANTLY PMN RARE SQUAMOUS EPITHELIAL CELLS PRESENT ABUNDANT GRAM NEGATIVE RODS Performed at [facility]    Culture   Final    MULTIPLE ORGANISMS PRESENT, NONE PREDOMINANT Note: NO STAPHYLOCOCCUS AUREUS ISOLATED NO GROUP A STREP (S.PYOGENES) ISOLATED Performed at [facility]    Report Status 06/25/2015 FINAL  Final     Labs: Basic Metabolic Panel:  Recent Labs Lab  06/23/15 0514 06/23/15 2314 06/24/15 0547 06/25/15 0545 06/26/15 0434 06/27/15 0447 06/28/15 0301  NA 140 139 141 142 146* 142 138  K 4.4 3.6 3.6 3.4* 3.3* 3.5 3.6  CL 108 108 107 105 111 105 103  CO2 17* 20* 20* 23 23 24 24   GLUCOSE 134* 290* 169* 140* 112* 94 131*  BUN 71* 59* 59* 51* 45* 49* 45*  CREATININE 2.91* 2.64* 2.62* 2.14* 1.82* 2.07* 2.04*  CALCIUM 7.6* 7.3* 7.6* 7.7* 7.3* 8.1* 7.9*  MG 1.6* 1.4*  --   --   --   --   --   PHOS 4.8*  --   --   --   --   --   --    Liver Function Tests:  Recent Labs Lab 06/24/15 0547 06/25/15 0545  AST 13* 9*  ALT 14 15  ALKPHOS 58 68  BILITOT 0.4 0.4  PROT 4.6* 4.9*  ALBUMIN 1.7* 1.8*   No results for input(s): LIPASE, AMYLASE in the last 168 hours. No results for input(s): AMMONIA in the last 168 hours. CBC:  Recent Labs Lab 06/24/15 0547 06/25/15 0545 06/26/15 0434 06/27/15 0447 06/28/15 0301 06/29/15 0400  WBC 17.0* 20.6* 20.6* 20.9* 20.5* 16.9*  NEUTROABS 15.0* 17.6*  --   --   --   --   HGB 9.1* 8.5* 7.7* 8.0* 7.5* 7.6*  HCT 29.6* 27.2* 25.3* 25.8* 24.0* 24.8*  MCV 96.4 97.8 97.7 100.0 98.4 95.4  PLT 265 261 208 190 173 143*   Cardiac Enzymes: No results for input(s): CKTOTAL, CKMB, CKMBINDEX, TROPONINI in the last 168 hours. BNP: BNP (last 3 results) No results for input(s): BNP in the last 8760 hours.  ProBNP (last 3 results) No results for input(s): PROBNP in the last 8760 hours.  CBG:  Recent Labs Lab 06/28/15 1720 06/28/15 2004 06/29/15 0037 06/29/15 0349 06/29/15 0852  GLUCAP 108* 110* 92 102* 81       Signed:  Rhetta Mura MD   [facility] 06/29/2015, 10:53 AM

## 2015-06-29 NOTE — Progress Notes (Signed)
7 Days Post-Op  Subjective: Has had persistent tachycardia overnight with rates in the 130s.  Wound vac placed yesterday by wound RN.  Pt only complaint this morning is nausea when she wakes up.  Denies any assoc symptoms including chest pain, shortness of breath, or emesis.   Objective: Vital signs in last 24 hours: Temp:  [97.8 F (36.6 C)-98.1 F (36.7 C)] 97.8 F (36.6 C) (05/12 0342) Pulse Rate:  [101] 101 (05/11 1722) Resp:  [12-18] 12 (05/12 0342) BP: (71-99)/(20-72) 96/20 mmHg (05/12 0342) SpO2:  [98 %-100 %] 98 % (05/12 0342) Last BM Date: 06/28/15  Intake/Output from previous day: 05/11 0701 - 05/12 0700 In: 902.4 [P.O.:240; I.V.:462.4; IV Piggyback:200] Out: 925 [Urine:925] Intake/Output this shift:    General appearance: alert, cooperative, appears stated age, no distress and morbidly obese GI: soft, non-tender; bowel sounds normal; no masses,  no organomegaly Incision/Wound:  LLE wounds with dressings in place.  The dorsum of the foot was unable to be covered with wound vac yesterday.  Continues to look healthy.  The medial thigh wounds have healthy appearing fat at the bases.  No evidence of ongoing infection.  Lower medial wound with some fibrinous exudate on top, but overall healthy.  Wound vac on lateral thigh wound.  Lab Results:   Recent Labs  06/28/15 0301 06/29/15 0400  WBC 20.5* 16.9*  HGB 7.5* 7.6*  HCT 24.0* 24.8*  PLT 173 143*   BMET  Recent Labs  06/27/15 0447 06/28/15 0301  NA 142 138  K 3.5 3.6  CL 105 103  CO2 24 24  GLUCOSE 94 131*  BUN 49* 45*  CREATININE 2.07* 2.04*  CALCIUM 8.1* 7.9*   PT/INR No results for input(s): LABPROT, INR in the last 72 hours. ABG No results for input(s): PHART, HCO3 in the last 72 hours.  Invalid input(s): PCO2, PO2  Studies/Results: No results found.  Anti-infectives: Anti-infectives    Start     Dose/Rate Route Frequency Ordered Stop   06/28/15 1200  fluconazole (DIFLUCAN) IVPB 100 mg     100 mg 50 mL/hr over 60 Minutes Intravenous Every 24 hours 06/28/15 1037     06/26/15 1800  imipenem-cilastatin (PRIMAXIN) 500 mg in sodium chloride 0.9 % 100 mL IVPB     500 mg 200 mL/hr over 30 Minutes Intravenous Every 6 hours 06/26/15 1021 07/17/15 2359   06/26/15 1015  imipenem-cilastatin (PRIMAXIN) 500 mg in sodium chloride 0.9 % 100 mL IVPB     500 mg 200 mL/hr over 30 Minutes Intravenous  Once 06/26/15 1011 06/26/15 1239   06/25/15 0500  colistimethate (COLYMYCIN) 200 mg in sodium chloride 0.9 % 100 mL IVPB  Status:  Discontinued     200 mg 205.3 mL/hr over 30 Minutes Intravenous Every 36 hours 06/23/15 1535 06/24/15 2236   06/25/15 0500  colistimethate (COLYMYCIN) 70 mg in sodium chloride 0.9 % 100 mL IVPB  Status:  Discontinued     70 mg 201.9 mL/hr over 30 Minutes Intravenous Every 12 hours 06/24/15 2236 06/26/15 0958   06/24/15 2000  amikacin (AMIKIN) 1,000 mg in dextrose 5 % 100 mL IVPB  Status:  Discontinued     1,000 mg 104 mL/hr over 60 Minutes Intravenous Every 48 hours 06/22/15 1608 06/23/15 1417   06/23/15 1800  Ampicillin-Sulbactam (UNASYN) 3 g in sodium chloride 0.9 % 100 mL IVPB  Status:  Discontinued     3 g 100 mL/hr over 60 Minutes Intravenous Every 6 hours 06/23/15 1435 06/26/15 0958  06/23/15 1800  linezolid (ZYVOX) tablet 600 mg  Status:  Discontinued     600 mg Per Tube Every 12 hours 06/23/15 1706 06/26/15 0958   06/23/15 1700  colistimethate (COLYMYCIN) 300 mg in sodium chloride 0.9 % 100 mL IVPB     300 mg 208 mL/hr over 30 Minutes Intravenous  Once 06/23/15 1535 06/23/15 1746   06/23/15 1530  linezolid (ZYVOX) 100 MG/5ML suspension 600 mg  Status:  Discontinued     600 mg Oral Every 12 hours 06/23/15 1407 06/23/15 1419   06/23/15 1430  linezolid (ZYVOX) tablet 600 mg  Status:  Discontinued     600 mg Per Tube Every 12 hours 06/23/15 1419 06/23/15 1706   06/22/15 2000  amikacin (AMIKIN) 1,050 mg in dextrose 5 % 100 mL IVPB     1,050 mg 104.2 mL/hr over  60 Minutes Intravenous  Once 06/22/15 1608 06/22/15 2341   06/22/15 1900  vancomycin (VANCOCIN) 2,500 mg in sodium chloride 0.9 % 500 mL IVPB  Status:  Discontinued     2,500 mg 250 mL/hr over 120 Minutes Intravenous Every 48 hours 06/22/15 1837 06/23/15 1407   06/22/15 1800  Ampicillin-Sulbactam (UNASYN) 3 g in sodium chloride 0.9 % 100 mL IVPB  Status:  Discontinued     3 g 100 mL/hr over 60 Minutes Intravenous Every 8 hours 06/22/15 1608 06/23/15 1435   06/22/15 0900  [MAR Hold]  cefTAZidime (FORTAZ) 2 g in dextrose 5 % 50 mL IVPB  Status:  Discontinued     (MAR Hold since 06/22/15 1556)   2 g 100 mL/hr over 30 Minutes Intravenous Every 12 hours 06/22/15 0826 06/22/15 1609   06/22/15 0300  clindamycin (CLEOCIN) IVPB 900 mg  Status:  Discontinued     900 mg 100 mL/hr over 30 Minutes Intravenous Every 8 hours 06/22/15 0158 06/23/15 1407   06/22/15 0200  piperacillin-tazobactam (ZOSYN) IVPB 3.375 g  Status:  Discontinued     3.375 g 12.5 mL/hr over 240 Minutes Intravenous Every 8 hours 06/22/15 0158 06/22/15 0756      Assessment/Plan: s/p Procedure(s): DEBRIDEMENT LEFT LEG INFECTION (Left) POD 7 s/p I+D of left leg wounds for necrotizing soft tissue infection  Overall wounds look good.  Would continue with W-D dressings on wounds unable to be covered with vac.  [place] WOC assistance with this.  Continue hydrotherapy as needed to W-D wounds. PT consult for mobilization. WBC improved.  Would continue with abx History of afib, on amio now with RVR.  Will defer management to Hospitalist.   LOS: 7 days    Concha SeJoshua S Pinchus Weckwerth 06/29/2015

## 2015-06-29 NOTE — Progress Notes (Signed)
Physical Therapy Wound Treatment Patient Details  Name: Michele Weber MRN: [id number] Date of Birth: 1971/02/07  Today's Date: 06/29/2015 Time: 5000-3819 Time Calculation (min): 97 min  Subjective  Subjective: Pt pleasant and agreeable to hydrotherapy Patient and Family Stated Goals: heal wounds Date of Onset: 05/20/15 Prior Treatments: Prior I&D per pt  Pain Score:    Wound Assessment  Pressure Ulcer 08/11/13 scab right upper thigh small open area 2x22 gauze dsg applied (Active)     Pressure Ulcer 09/30/13 Stage II -  Partial thickness loss of dermis presenting as a shallow open ulcer with a red, pink wound bed without slough. (Active)     Pressure Ulcer 09/30/13 Stage II -  Partial thickness loss of dermis presenting as a shallow open ulcer with a red, pink wound bed without slough. (Active)     Pressure Ulcer 06/22/15 Unstageable - Full thickness tissue loss in which the base of the ulcer is covered by slough (yellow, tan, gray, green or brown) and/or eschar (tan, brown or black) in the wound bed. (Active)     Negative Pressure Wound Therapy Leg Left (Active)  Last dressing change 06/28/15 06/28/2015  7:30 PM  Cycle Continuous 06/28/2015  7:30 PM  Target Pressure (mmHg) 125 06/28/2015  7:30 PM  Dressing Status Intact 06/28/2015  7:30 PM  Drainage Amount Moderate 06/28/2015  7:30 PM  Drainage Description Other (Comment) 06/28/2015  7:30 PM     Wound / Incision (Open or Dehisced) 08/11/13 Leg Left (Active)  Dressing Type Impregnated gauze (petrolatum) 06/27/2015  4:00 AM  Dressing Changed Changed 06/27/2015  6:00 AM  Dressing Status Dry;Clean;Intact 06/26/2015  4:00 AM  Dressing Change Frequency Daily 06/26/2015  4:00 AM  Site / Wound Assessment Bleeding;Painful 06/07/2015  8:30 PM  Drainage Amount Minimal 06/07/2015  8:30 PM  Drainage Description Serosanguineous 06/07/2015  8:30 PM     Wound / Incision (Open or Dehisced) Other (Comment) Leg Right;Left cellulitis  (Active)  Dressing Type  Impregnated gauze (petrolatum) 06/27/2015  4:00 AM  Dressing Changed Changed 06/27/2015  6:00 AM  Dressing Status Clean;Dry;Intact 06/07/2015  8:30 PM  Dressing Change Frequency Daily 06/07/2015  8:30 PM  Site / Wound Assessment Painful;Bleeding 06/07/2015  8:30 PM  % Wound base Red or Granulating 50% 05/31/2015  2:39 AM  % Wound base Yellow 50% 05/31/2015  2:39 AM  Peri-wound Assessment Induration 05/31/2015  2:39 AM  Margins Unattached edges (unapproximated) 06/01/2015 11:00 PM  Closure None 06/01/2015 11:00 PM  Drainage Amount Minimal 06/07/2015  8:30 PM  Drainage Description Serosanguineous 06/07/2015  8:30 PM  Treatment Cleansed;Other (Comment) 06/01/2015 12:15 AM     Wound / Incision (Open or Dehisced) Hip Left (Active)  Dressing Type ABD;Gauze (Comment);Moist to dry 06/28/2015  6:00 AM  Dressing Changed Changed 06/28/2015  6:00 AM  Dressing Status Clean;Dry;Intact 06/26/2015  4:00 AM  Dressing Change Frequency Daily 06/28/2015  6:00 AM  Site / Wound Assessment Granulation tissue 06/25/2015  4:00 AM  % Wound base Red or Granulating 100% 06/25/2015  4:00 AM  % Wound base Yellow 90% 05/31/2015  2:39 AM  % Wound base Black 0% 05/30/2015  6:00 PM  Peri-wound Assessment Induration 05/31/2015  2:39 AM  Margins Unattached edges (unapproximated) 06/01/2015 11:00 PM  Closure None 06/01/2015 11:00 PM  Drainage Amount Minimal 06/07/2015  8:30 PM  Drainage Description Serosanguineous 06/07/2015  8:30 PM  Treatment Cleansed;Other (Comment) 06/01/2015 11:00 PM     Wound / Incision (Open or Dehisced) 06/20/15 Leg large open weeping wounds  extending from left thigh to left foot (Active)  Dressing Type ABD 06/27/2015  4:00 AM  Dressing Changed Changed 06/27/2015  6:00 AM  Dressing Status Intact 06/26/2015  6:00 AM  Dressing Change Frequency Twice a day 06/26/2015  6:00 AM  Site / Wound Assessment Dressing in place / Unable to assess 06/26/2015  4:00 AM  % Wound base Red or Granulating 25% 06/22/2015 12:00 PM  % Wound base Yellow  25% 06/22/2015 12:00 PM  % Wound base Black 50% 06/22/2015 12:00 PM  Peri-wound Assessment Intact 06/22/2015 12:00 PM  Margins Unattached edges (unapproximated) 06/22/2015 12:00 PM  Closure None 06/22/2015 12:00 PM  Drainage Amount Moderate 06/25/2015 12:00 PM  Drainage Description Purulent;Serosanguineous 06/25/2015 12:00 PM     Wound / Incision (Open or Dehisced) 06/20/15 Foot Left large weeping wound on top of left foot (Active)  Dressing Type ABD 06/29/2015 11:00 AM  Dressing Changed Changed 06/29/2015 11:00 AM  Dressing Status Clean;Dry;Intact 06/29/2015 11:00 AM  Dressing Change Frequency Twice a day 06/29/2015 11:00 AM  Site / Wound Assessment Dressing in place / Unable to assess 06/29/2015 11:00 AM  % Wound base Red or Granulating 90% 06/29/2015 11:00 AM  % Wound base Yellow 5% 06/29/2015 11:00 AM  % Wound base Black 5% 06/29/2015 11:00 AM  Peri-wound Assessment Intact 06/29/2015 11:00 AM  Wound Length (cm) 9 cm 06/26/2015  2:53 PM  Wound Width (cm) 11 cm 06/26/2015  2:53 PM  Wound Depth (cm) 1 cm 06/26/2015  2:53 PM  Margins Unattached edges (unapproximated) 06/29/2015 11:00 AM  Closure None 06/29/2015 11:00 AM  Drainage Amount Minimal 06/29/2015 11:00 AM  Drainage Description Purulent 06/29/2015 11:00 AM  Treatment Cleansed;Packing (Saline gauze) 06/29/2015 11:00 AM     Wound / Incision (Open or Dehisced) 06/20/15 Other (Comment) Thigh Right large wound inner thing (Active)  Dressing Type ABD;Gauze (Comment);Moist to dry 06/28/2015  6:00 AM  Dressing Changed Changed 06/28/2015  6:00 AM  Dressing Status Dry;Intact 06/26/2015  6:00 AM  Dressing Change Frequency Daily 06/28/2015  6:00 AM  Site / Wound Assessment Granulation tissue;Pink;Red 06/25/2015  4:00 AM  % Wound base Red or Granulating 100% 06/25/2015  4:00 AM  % Wound base Yellow 5% 06/22/2015 12:00 PM  % Wound base Black 5% 06/22/2015 12:00 PM  Wound Length (cm) 15 cm 06/22/2015 12:30 AM  Wound Width (cm) 10 cm 06/22/2015 12:30 AM     Wound / Incision (Open or  Dehisced) 06/26/15 Other (Comment) Thigh Left Superior medial (Active)  Dressing Type ABD;Gauze (Comment);Moist to dry;Barrier Film (skin prep) 06/29/2015 11:00 AM  Dressing Changed Changed 06/29/2015 11:00 AM  Dressing Status Clean;Dry;Intact 06/29/2015 11:00 AM  Dressing Change Frequency Daily 06/29/2015 11:00 AM  Site / Wound Assessment Pink;Brown;Yellow 06/29/2015 11:00 AM  % Wound base Red or Granulating 45% 06/29/2015 11:00 AM  % Wound base Yellow 5% 06/29/2015 11:00 AM  % Wound base Black 5% 06/29/2015 11:00 AM  % Wound base Other (Comment) 45% 06/29/2015 11:00 AM  Peri-wound Assessment Intact 06/29/2015 11:00 AM  Wound Length (cm) 15 cm 06/26/2015  2:53 PM  Wound Width (cm) 30 cm 06/26/2015  2:53 PM  Wound Depth (cm) 4.5 cm 06/26/2015  2:53 PM  Undermining (cm) 4.1 at 11 o'clock, 2.5 at 12 o'clock 06/26/2015  2:53 PM  Margins Unattached edges (unapproximated) 06/29/2015 11:00 AM  Closure None 06/29/2015 11:00 AM  Drainage Amount Minimal 06/29/2015 11:00 AM  Drainage Description Purulent 06/29/2015 11:00 AM  Treatment Debridement (Selective);Hydrotherapy (Pulse lavage);Packing (Saline gauze) 06/29/2015  11:00 AM     Wound / Incision (Open or Dehisced) 06/26/15 Other (Comment) Thigh Left Inferior medial (Active)  Dressing Type ABD;Gauze (Comment);Moist to dry;Barrier Film (skin prep) 06/29/2015 11:00 AM  Dressing Changed Changed 06/29/2015 11:00 AM  Dressing Status Clean;Dry;Intact 06/29/2015 11:00 AM  Dressing Change Frequency Daily 06/29/2015 11:00 AM  Site / Wound Assessment Brown;Yellow;Pink 06/29/2015 11:00 AM  % Wound base Red or Granulating 40% 06/29/2015 11:00 AM  % Wound base Yellow 10% 06/29/2015 11:00 AM  % Wound base Black 5% 06/29/2015 11:00 AM  % Wound base Other (Comment) 45% 06/29/2015 11:00 AM  Peri-wound Assessment Intact 06/29/2015 11:00 AM  Wound Length (cm) 14 cm 06/26/2015  2:53 PM  Wound Width (cm) 38.5 cm 06/26/2015  2:53 PM  Wound Depth (cm) 6.5 cm 06/26/2015  2:53 PM  Margins Unattached  edges (unapproximated) 06/29/2015 11:00 AM  Closure None 06/29/2015 11:00 AM  Drainage Amount Minimal 06/29/2015 11:00 AM  Drainage Description Purulent 06/29/2015 11:00 AM  Treatment Debridement (Selective);Hydrotherapy (Pulse lavage);Packing (Saline gauze) 06/29/2015 11:00 AM     Wound / Incision (Open or Dehisced) 06/26/15 Other (Comment) Thigh Left;Medial Calf (Active)  Dressing Type ABD;Gauze (Comment);Moist to dry;Barrier Film (skin prep) 06/29/2015 11:00 AM  Dressing Changed Changed 06/29/2015 11:00 AM  Dressing Status Clean;Dry;Intact 06/29/2015 11:00 AM  Dressing Change Frequency Daily 06/29/2015 11:00 AM  Site / Wound Assessment Brown;Yellow;Pink 06/29/2015 11:00 AM  % Wound base Red or Granulating 20% 06/29/2015 11:00 AM  % Wound base Yellow 15% 06/29/2015 11:00 AM  % Wound base Black 5% 06/29/2015 11:00 AM  % Wound base Other (Comment) 60% 06/29/2015 11:00 AM  Peri-wound Assessment Intact 06/29/2015 11:00 AM  Wound Length (cm) 8.5 cm 06/26/2015  2:53 PM  Wound Width (cm) 12.5 cm 06/26/2015  2:53 PM  Wound Depth (cm) 5 cm 06/26/2015  2:53 PM  Margins Unattached edges (unapproximated) 06/29/2015 11:00 AM  Closure None 06/29/2015 11:00 AM  Drainage Amount Minimal 06/29/2015 11:00 AM  Drainage Description Purulent 06/29/2015 11:00 AM  Treatment Debridement (Selective);Hydrotherapy (Pulse lavage);Packing (Saline gauze) 06/29/2015 11:00 AM     Wound / Incision (Open or Dehisced) 06/26/15 Other (Comment) Thigh Left;Lateral (Active)  Dressing Type ABD;Gauze (Comment);Barrier Film (skin prep);Moist to dry 06/28/2015 12:00 PM  Dressing Changed Changed 06/28/2015 12:00 PM  Dressing Status Clean;Dry;Intact 06/28/2015  7:30 PM  Dressing Change Frequency Daily 06/28/2015 12:00 PM  Site / Wound Assessment Brown;Pink;Yellow 06/28/2015 12:00 PM  % Wound base Red or Granulating 60% 06/28/2015 12:00 PM  % Wound base Yellow 15% 06/28/2015 12:00 PM  % Wound base Black 5% 06/28/2015 12:00 PM  % Wound base Other (Comment) 20%  06/28/2015 12:00 PM  Wound Length (cm) 6.5 cm 06/26/2015  2:53 PM  Wound Width (cm) 8 cm 06/26/2015  2:53 PM  Wound Depth (cm) 7 cm 06/26/2015  2:53 PM  Margins Unattached edges (unapproximated) 06/28/2015 12:00 PM  Closure None 06/28/2015 12:00 PM  Drainage Amount Minimal 06/28/2015 12:00 PM  Drainage Description Purulent 06/28/2015 12:00 PM  Treatment Hydrotherapy (Pulse lavage);Packing (Saline gauze) 06/28/2015 12:00 PM  Santyl applied to wound bed prior to applying dressing.  Hydrotherapy Pulsed lavage therapy - wound location: LLE wounds Pulsed Lavage with Suction (psi): 4 psi (to 8 psi) Pulsed Lavage with Suction - Normal Saline Used: 1000 mL (x3) Pulsed Lavage Tip: Tip with splash shield Selective Debridement Selective Debridement - Location: LLE wounds Selective Debridement - Tools Used: Scissors;Forceps Selective Debridement - Tissue Removed: brown and yellow necrotic tissue   Wound  Assessment and Plan  Wound Therapy - Assess/Plan/Recommendations Wound Therapy - Clinical Statement: Pt's wounds are cleaning up very nicely.  Anticipate pt will be able to begin BID nsg dressing changes and discontinue hydrotherapy.  Noted pt to D/C to [facility], if does not D/C, will f/u Monday.   Wound Therapy - Functional Problem List: Decreased strength and AROM Factors Delaying/Impairing Wound Healing: Immobility;Multiple medical problems Hydrotherapy Plan: Debridement;Dressing change;Patient/family education;Pulsatile lavage with suction Wound Therapy - Frequency: 6X / week Wound Therapy - Follow Up Recommendations: Skilled nursing facility Wound Plan: See above  Wound Therapy Goals- Improve the function of patient's integumentary system by progressing the wound(s) through the phases of wound healing (inflammation - proliferation - remodeling) by: Decrease Necrotic Tissue to: 25% Decrease Necrotic Tissue - Progress: Met Increase Granulation Tissue to: 75% Increase Granulation Tissue - Progress:  Progressing toward goal Goals/treatment plan/discharge plan were made with and agreed upon by patient/family: Yes Wound Therapy - Potential for Goals: Good  Goals will be updated until maximal potential achieved or discharge criteria met.  Discharge criteria: when goals achieved, discharge from hospital, MD decision/surgical intervention, no progress towards goals, refusal/missing three consecutive treatments without notification or medical reason.  GP     Camreigh Michie, Thornton Papas, [place] 06/29/2015, 1:32 PM

## 2015-06-29 NOTE — Progress Notes (Signed)
ANTICOAGULATION CONSULT NOTE - Follow Up Consult  Pharmacy Consult for Xarelto Indication: atrial fibrillation  No Known Allergies  Patient Measurements: Height: 5' (152.4 cm) Weight: (!) 580 lb (263.086 kg) IBW/kg (Calculated) : 45.5  Vital Signs: Temp: 97.3 F (36.3 C) (05/12 1124) Temp Source: Oral (05/12 1124) BP: 82/54 mmHg (05/12 1124) Pulse Rate: 133 (05/12 1124)  Labs:  Recent Labs  06/27/15 0447  06/28/15 0301 06/28/15 0458 06/28/15 1719 06/29/15 0400  HGB 8.0*  --  7.5*  --   --  7.6*  HCT 25.8*  --  24.0*  --   --  24.8*  PLT 190  --  173  --   --  143*  HEPARINUNFRC 0.54  < > 1.78* 0.23* 1.42* 0.18*  CREATININE 2.07*  --  2.04*  --   --   --   < > = values in this interval not displayed.  Estimated Creatinine Clearance: 72.8 mL/min (by C-G formula based on Cr of 2.04).  Assessment: Coumadin PTA for hx Afib. INR 2.46, received FFP and Vit K > INR down to 1.43. No surgery, just hydrotherapy-tolerating, started heparin 5/8.  Last HL was low again at 0.18. Rate increased but plan is to discharge today and MD ok with switching to Xarelto. Hgb 7.6, plts 143. No s/s of bleed.  Goal of Therapy:  Heparin level 0.3-0.7 units/ml Monitor platelets by anticoagulation protocol: Yes   Plan:  Stop heparin gtt Start Xarelto 20mg  PO with dinner tonight Monitor CBC, s/s of bleed  Enzo BiNathan Uriel Dowding, PharmD, BCPS Clinical Pharmacist Pager [phone] 06/29/2015 2:26 PM

## 2015-06-30 LAB — TYPE AND SCREEN
ABO/RH(D): B POS
Antibody Screen: NEGATIVE
Unit division: 0

## 2015-06-30 NOTE — Progress Notes (Signed)
Pt discharged at 2240 via [facility] to [facility].  Vital signs stable at time of discharge. All patient belongings sent with the patient.  Pt was discharged with her foley catheter and central line in place per order.

## 2015-07-06 NOTE — Anesthesia Postprocedure Evaluation (Signed)
Anesthesia Post Note  Patient: Marcie MowersSharon Louque  Procedure(s) Performed: Procedure(s) (LRB): DEBRIDEMENT LEFT LEG INFECTION (Left)  Patient location during evaluation: ICU Anesthesia Type: General Level of consciousness: patient remains intubated per anesthesia plan Vital Signs Assessment: post-procedure vital signs reviewed and stable Respiratory status: patient on ventilator - see flowsheet for VS and patient remains intubated per anesthesia plan Cardiovascular status: blood pressure returned to baseline Anesthetic complications: no    Last Vitals:  Filed Vitals:   06/29/15 2152 06/29/15 2159  BP: 94/51 94/51  Pulse: 130 127  Temp: 36.9 C 36.9 C  Resp: 24 15    Last Pain:  Filed Vitals:   06/30/15 0132  PainSc: 0-No pain                 Amylah Will COKER

## 2015-08-18 DEATH — deceased

## 2017-03-16 IMAGING — CR DG CHEST 1V PORT
1 series · 1 of 1 positions shown · non-contrast
Comparison: 05/24/2011

CLINICAL DATA: Central line placement

EXAM:
PORTABLE CHEST 1 VIEW

[AP]
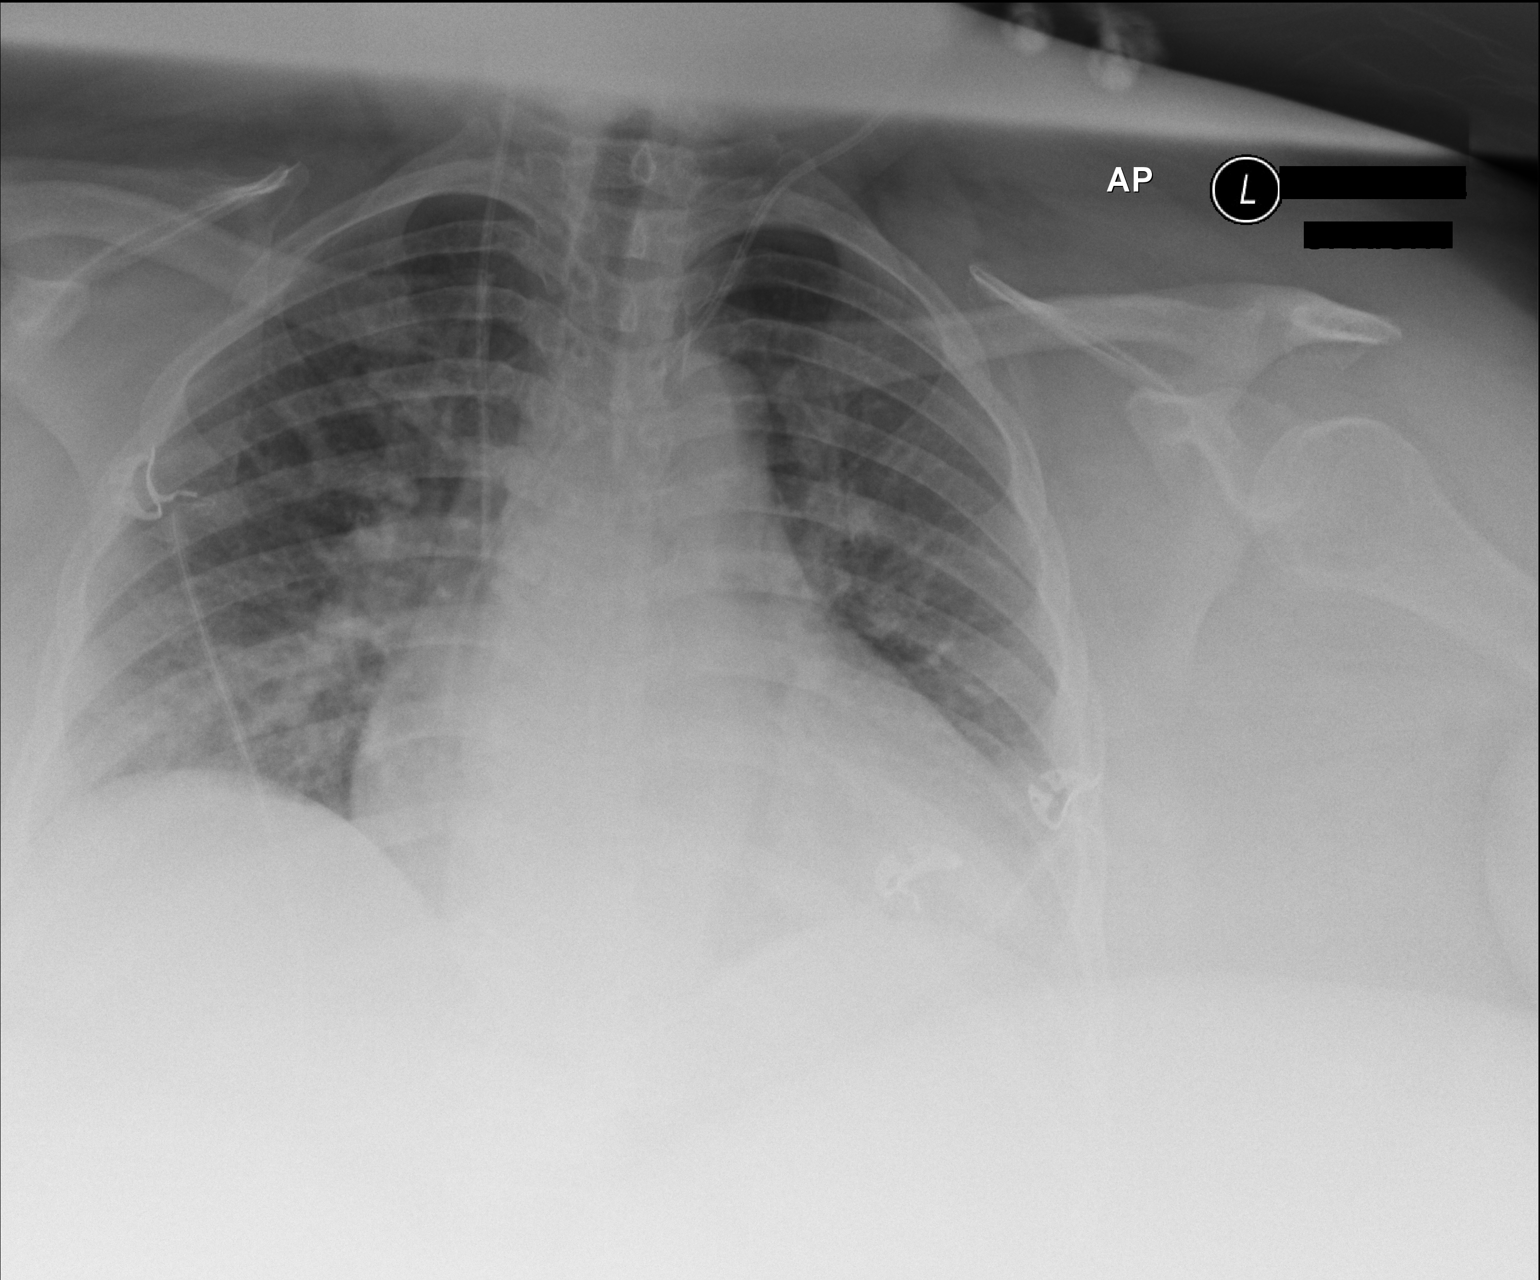

[1 of 1 positions shown; findings below may reference images not displayed]

FINDINGS: New central line from a left jugular approach. Tip appears to be in
the mid right atrium.

Right jugular catheter in the SVC unchanged.

No pneumothorax

Cardiac enlargement. Improvement in vascular congestion. Improvement
in bibasilar atelectasis.
IMPRESSION: Left jugular central venous catheter tip in the mid right stroke
atrium. No pneumothorax

Improvement in vascular congestion. Improvement in bibasilar
atelectasis.

## 2017-04-07 IMAGING — CR DG CHEST 1V PORT
1 series · 1 of 1 positions shown · non-contrast
Comparison: May 31, 2015

CLINICAL DATA: Hypoxia

EXAM:
PORTABLE CHEST 1 VIEW

[AP]
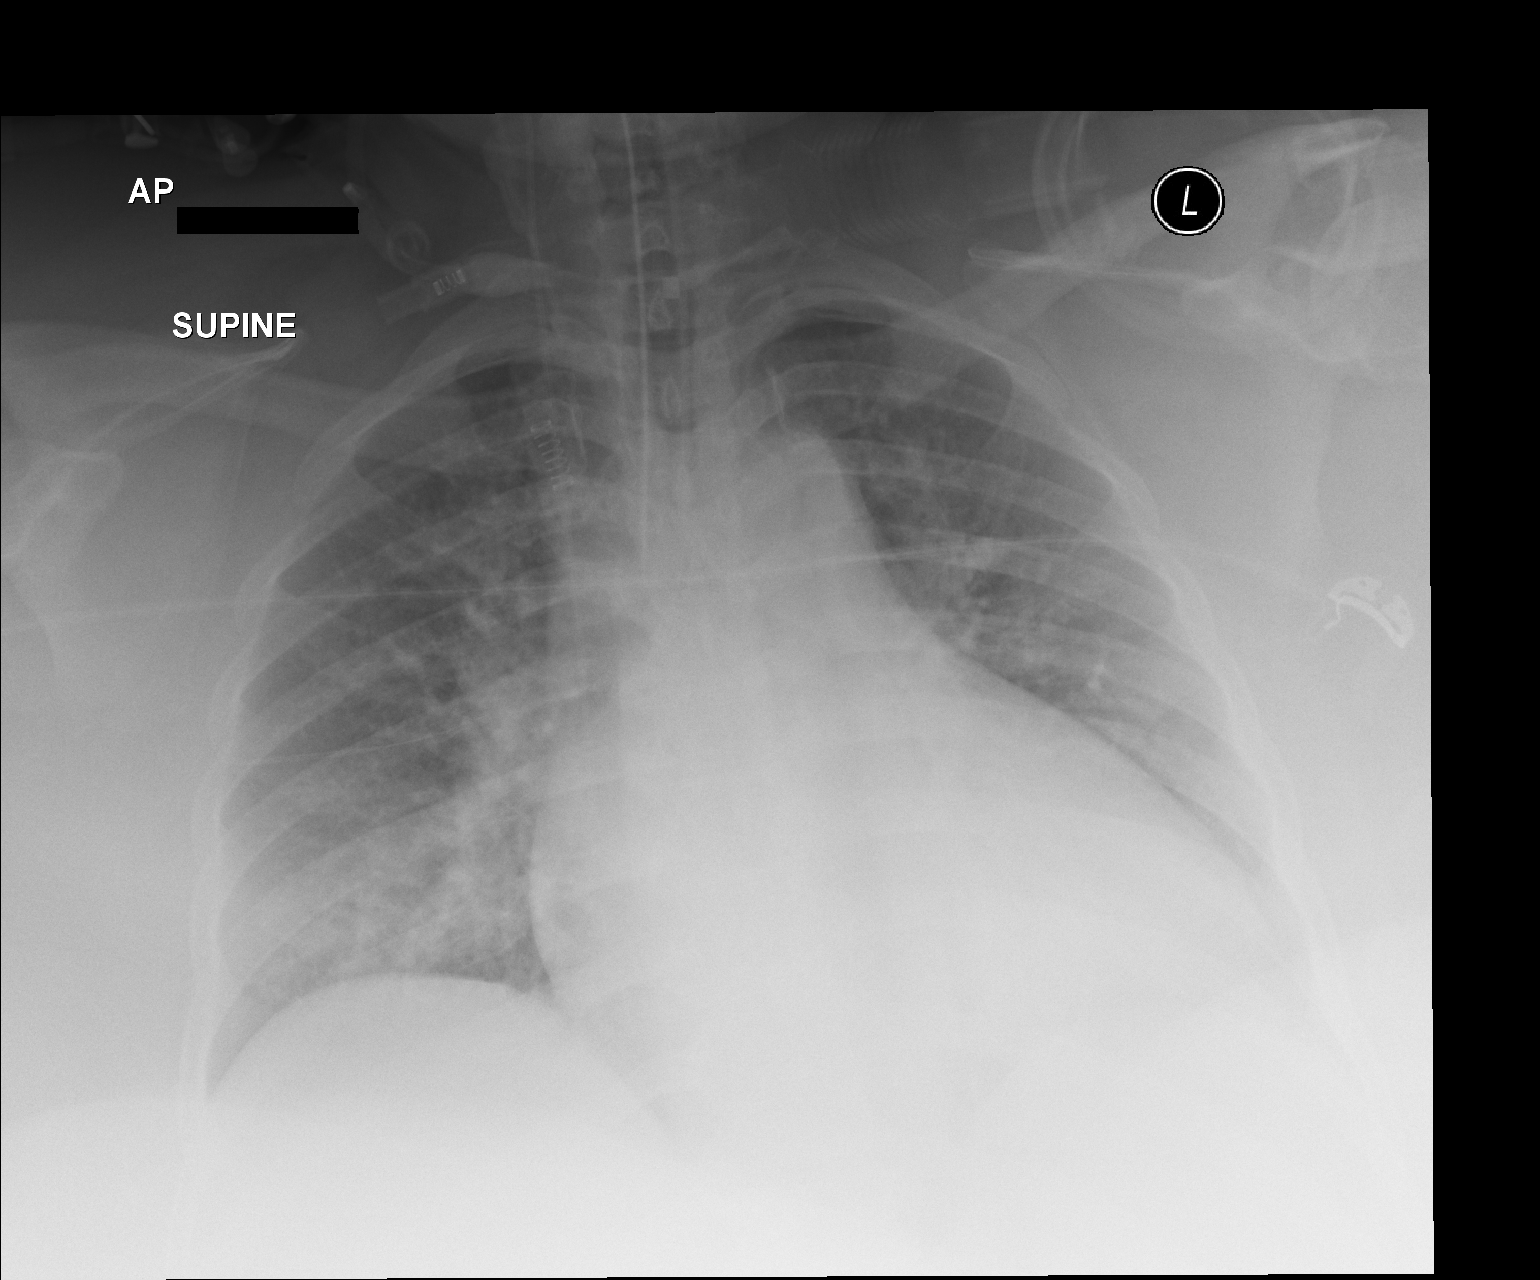

[1 of 1 positions shown; findings below may reference images not displayed]

FINDINGS: Endotracheal tube tip is at the carina. Central catheter tip in
superior vena cava near the cavoatrial junction. No pneumothorax.
There is no edema or consolidation. Heart is enlarged with pulmonary
vascularity within normal limits. No adenopathy. No bone lesions.
IMPRESSION: Endotracheal tube tip is at the carina. Advise withdrawing
approximately 3 cm. No edema or consolidation. No pneumothorax.
There is cardiomegaly.

Critical Value/emergent results were called by telephone at the time
of interpretation on 06/22/2015 at [DATE] to Geo Antwi, RN , who
verbally acknowledged these results.

## 2017-04-08 IMAGING — CR DG ABD PORTABLE 1V
2 series · 2 of 2 positions shown · non-contrast
Comparison: 06/22/2015, 05/31/2015, 05/28/2015

CLINICAL DATA: 44-year-old female with a history of orogastric tube
placement

EXAM:
PORTABLE ABDOMEN - 1 VIEW

[AP (1 of 2)]
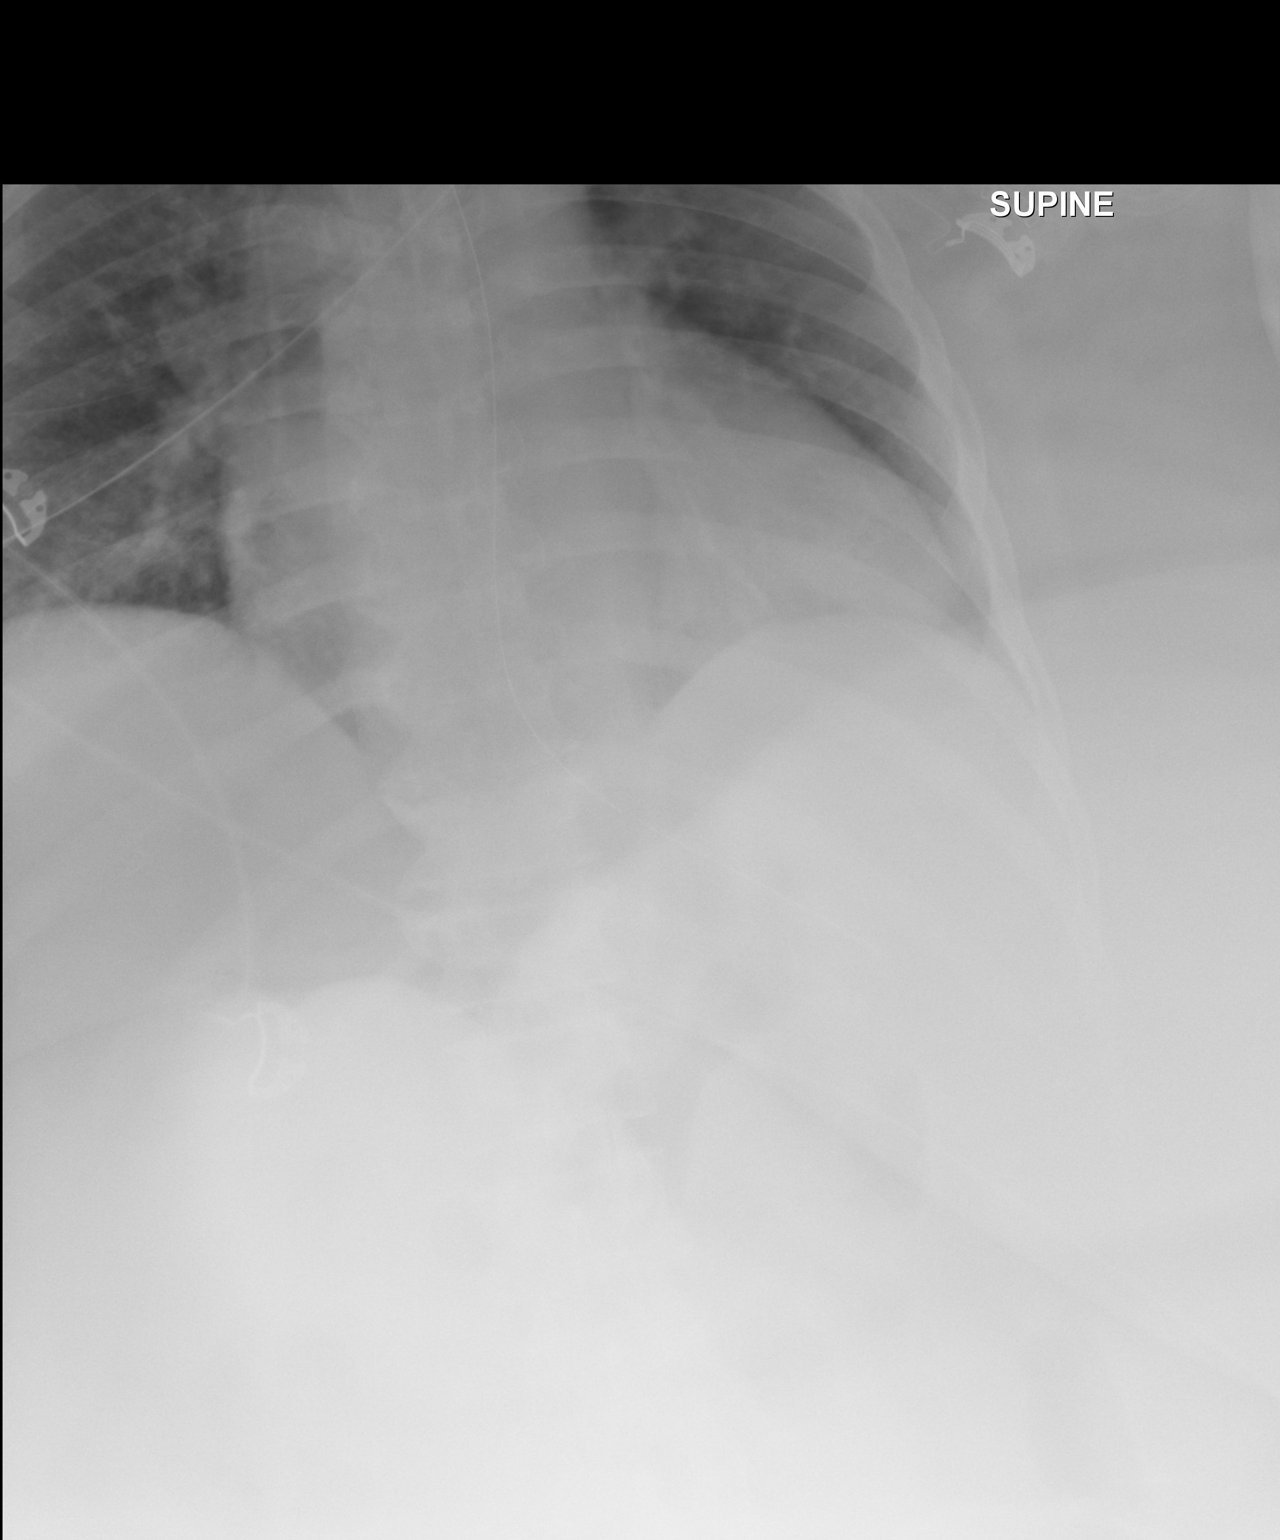

[AP (2 of 2)]
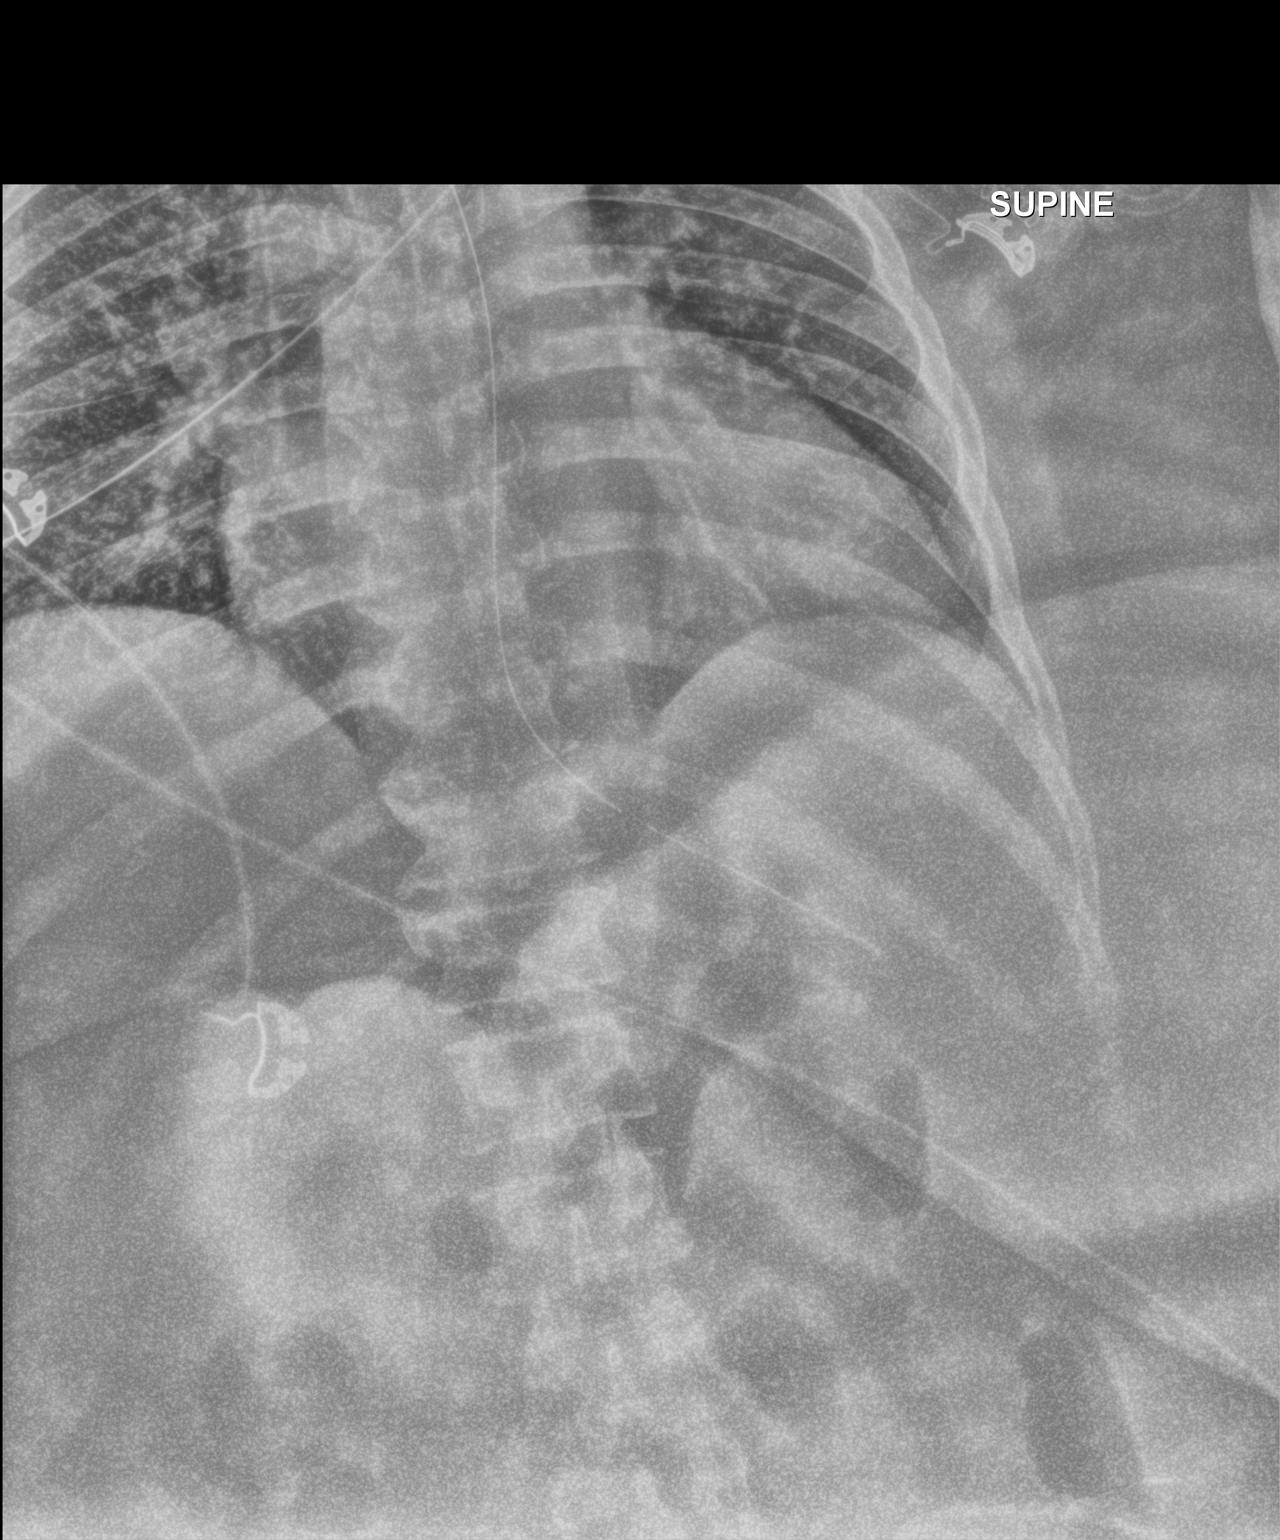

[2 of 2 positions shown; findings below may reference images not displayed]

FINDINGS: Limited plain film demonstrating interval placement of gastric tube
terminating in the left upper quadrant. The side port terminates
near the GE junction, slightly below the diaphragm level.
IMPRESSION: Interval placement of gastric tube terminating in the stomach left
upper quadrant. Side port terminates near the GE junction, likely in
the cardia.

## 2017-04-09 IMAGING — DX DG CHEST 1V PORT
1 series · 1 of 1 positions shown · non-contrast
Comparison: June 22, 2015

CLINICAL DATA: Hypoxia

EXAM:
PORTABLE CHEST 1 VIEW

[chest ap]
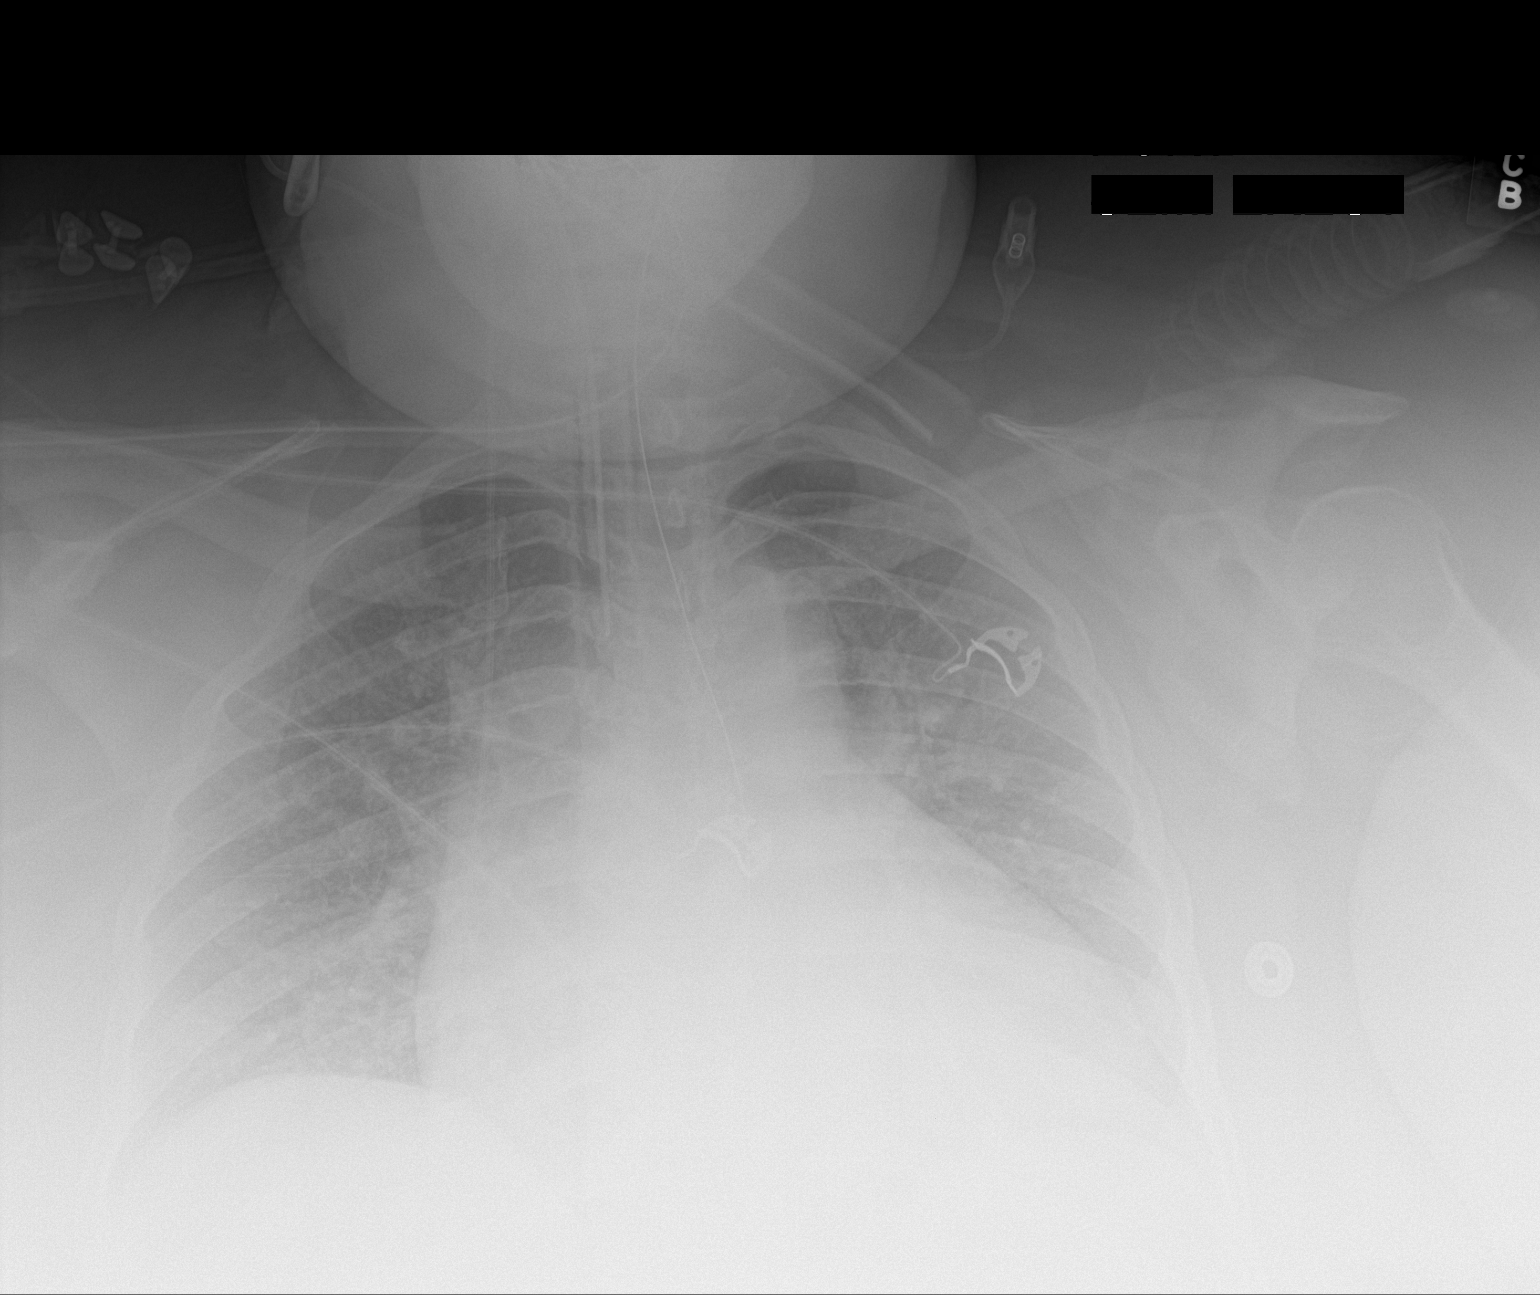

[1 of 1 positions shown; findings below may reference images not displayed]

FINDINGS: Endotracheal tube tip is 2.4 cm above the carina. Nasogastric tube
tip and side port are below the diaphragm. Central catheter tip is
in the superior vena cava. No pneumothorax. There is no edema or
consolidation. There is cardiomegaly with pulmonary vascularity
within normal limits. No adenopathy evident.
IMPRESSION: Tube and catheter positions without pneumothorax. Stable
cardiomegaly. No edema or consolidation.

## 2017-04-10 IMAGING — CR DG CHEST 1V PORT
1 series · 1 of 1 positions shown · non-contrast
Comparison: Portable chest x-ray June 24, 2015

CLINICAL DATA: Pulmonary edema, shortness of breath, intubated
patient

EXAM:
PORTABLE CHEST 1 VIEW

[AP]
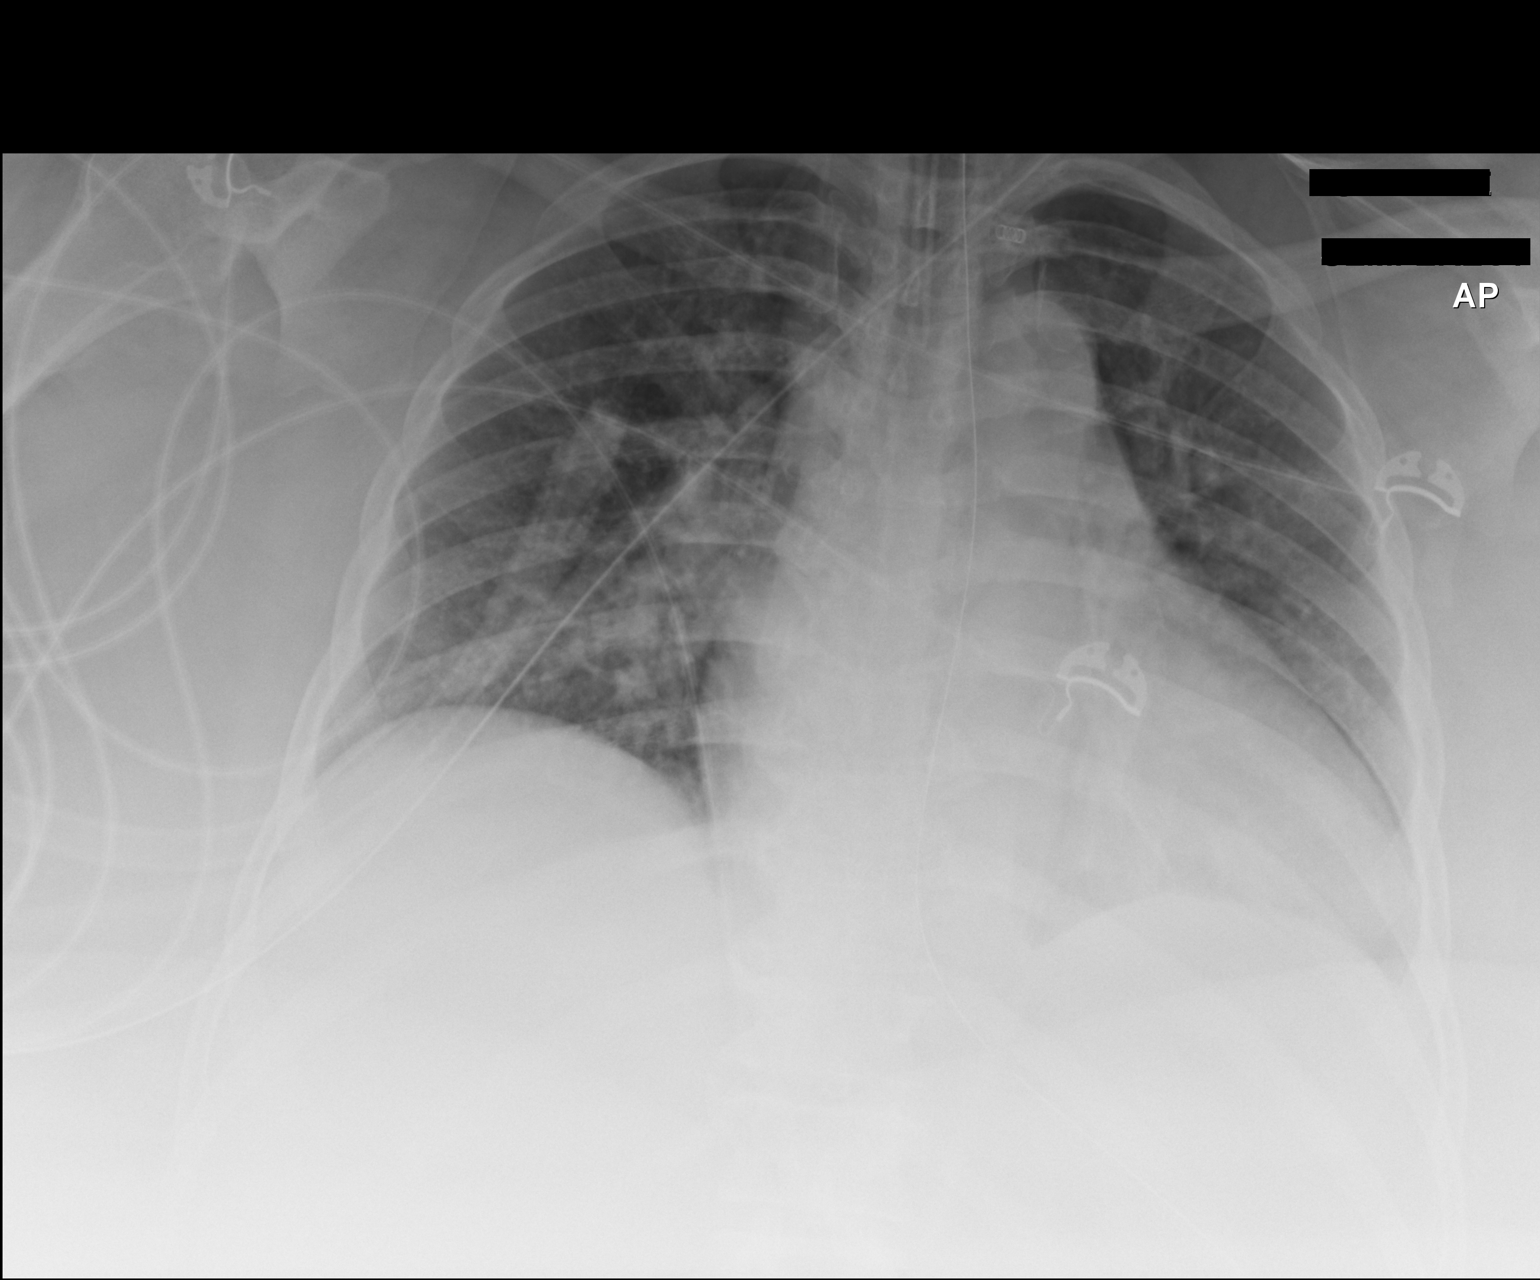

[1 of 1 positions shown; findings below may reference images not displayed]

FINDINGS: The lungs are borderline hypoinflated. The interstitial markings are
less prominent today. There is no alveolar infiltrate, pleural
effusion, or pneumothorax. The cardiac silhouette remains enlarged.
The pulmonary vascularity remains prominent centrally.

The endotracheal tube tip lies 3.6 cm above the carina. The
esophagogastric tube tip projects below the inferior margin of the
image. The right internal jugular venous catheter tip projects over
the posterior aspect of the right second rib.
IMPRESSION: Cardiomegaly and mild central pulmonary vascular congestion with
decreased pulmonary interstitial edema. The right internal jugular
venous catheter has apparently been partially withdrawn and now its
tip projects over the inferior aspect of the inferior vena cava at
the level of the posterior second rib. The other support tubes are
in stable position.
